# Patient Record
Sex: Male | Born: 1980 | Race: White | Hispanic: No | Marital: Married | Smoking: Never smoker
Health system: Southern US, Community
[De-identification: ages and names within clinical notes are randomized; demographics above are authoritative.]

## PROBLEM LIST (undated history)

## (undated) DIAGNOSIS — G500 Trigeminal neuralgia: Secondary | ICD-10-CM

## (undated) DIAGNOSIS — G4700 Insomnia, unspecified: Secondary | ICD-10-CM

## (undated) DIAGNOSIS — I10 Essential (primary) hypertension: Secondary | ICD-10-CM

## (undated) DIAGNOSIS — E663 Overweight: Secondary | ICD-10-CM

## (undated) DIAGNOSIS — F411 Generalized anxiety disorder: Secondary | ICD-10-CM

## (undated) DIAGNOSIS — G43009 Migraine without aura, not intractable, without status migrainosus: Secondary | ICD-10-CM

## (undated) HISTORY — DX: Migraine without aura, not intractable, without status migrainosus: G43.009

## (undated) HISTORY — DX: Insomnia, unspecified: G47.00

## (undated) HISTORY — PX: TONSILLECTOMY: SUR1361

## (undated) HISTORY — DX: Generalized anxiety disorder: F41.1

## (undated) HISTORY — DX: Overweight: E66.3

## (undated) HISTORY — DX: Essential (primary) hypertension: I10

---
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
## 2007-07-04 ENCOUNTER — Emergency Department (HOSPITAL_COMMUNITY): Admission: EM | Admit: 2007-07-04 | Discharge: 2007-07-04 | Payer: Self-pay | Admitting: Emergency Medicine

## 2008-02-20 ENCOUNTER — Encounter: Admission: RE | Admit: 2008-02-20 | Discharge: 2008-02-20 | Payer: Self-pay | Admitting: Family Medicine

## 2014-07-10 ENCOUNTER — Encounter: Payer: Self-pay | Admitting: Neurology

## 2014-07-10 ENCOUNTER — Ambulatory Visit (INDEPENDENT_AMBULATORY_CARE_PROVIDER_SITE_OTHER): Payer: BC Managed Care – PPO | Admitting: Neurology

## 2014-07-10 VITALS — BP 138/80 | HR 82 | Ht 70.5 in | Wt 191.0 lb

## 2014-07-10 DIAGNOSIS — G35 Multiple sclerosis: Secondary | ICD-10-CM

## 2014-07-10 DIAGNOSIS — G509 Disorder of trigeminal nerve, unspecified: Secondary | ICD-10-CM

## 2014-07-10 DIAGNOSIS — G500 Trigeminal neuralgia: Secondary | ICD-10-CM

## 2014-07-10 DIAGNOSIS — M5481 Occipital neuralgia: Secondary | ICD-10-CM

## 2014-07-10 DIAGNOSIS — R202 Paresthesia of skin: Secondary | ICD-10-CM

## 2014-07-10 DIAGNOSIS — F411 Generalized anxiety disorder: Secondary | ICD-10-CM

## 2014-07-10 MED ORDER — CARBAMAZEPINE ER 200 MG PO TB12: 200.0000 mg | ORAL_TABLET | Freq: Two times a day (BID) | ORAL | Status: DC

## 2014-07-10 NOTE — Patient Instructions (Signed)
Overall you are doing fairly well but I do want to suggest a few things today:   Remember to drink plenty of fluid, eat healthy meals and do not skip any meals. Try to eat protein with a every meal and eat a healthy snack such as fruit or nuts in between meals. Try to keep a regular sleep-wake schedule and try to exercise daily, particularly in the form of walking, 20-30 minutes a day, if you can.   As far as your medications are concerned, I would like to suggest: Tegretol XR 200mg  twice daily  As far as diagnostic testing: MRI of the brain, MRI of the cervical spine, lab  I would like to see you back in 3 months, sooner if we need to. Please call us with any interim questions, concerns, problems, updates or refill requests.   Please also call us for any test results so we can go over those with you on the phone.  My clinical assistant and will answer any of your questions and relay your messages to me and also relay most of my messages to you.   Our phone number is [phone]. We also have an after hours call service for urgent matters and there is a physician on-call for urgent questions. For any emergencies you know to call 911 or go to the nearest emergency room

## 2014-07-10 NOTE — Progress Notes (Signed)
[facility]    Provider:  Dr Lucia Gaskins Referring Provider: Sigmund Hazel, MD Primary Care Physician:  Neldon Labella, MD  CC:  Severe headache  HPI:  Juan Clarke is a 33 y.o. male here as a referral from Dr. Hyacinth Meeker for headache. He has random shooting pains in the crown of head. They are disorienting and they happen 5 times in an hour at least. Sometimes if he bends his neck forward there is an electrical shooting pain down his spine. Sometimes shooting pain in the bilateral temples. Sometimes eyes hurt. Nose is constantly running and gets congested when the symtpoms start. Symptoms are every day. They are brief, intense, lightning bolts, severe 10/10. Drinks 2 drinks a night. Symptoms started years ago. He started having a burning sensation in his head. Burning in the back of the head. 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 Up to 30 times a day now or more. Brief. Random. No tenderness. No neck pain. Happens on both sides but not usually together. He has electric pain down the neck. Unclear what the triggers are andnothing makes it better. No weakness. No other focal neurologic symptoms. No FHx of neuromuscular, neurodegenerative, rheumatologic disorders.   Reviewed notes, labs and imaging from outside physicians, which showed:   DG lumbar spine 2009 Clinical Data: Recurrent midline low back pain. No trauma.  LUMBAR SPINE - COMPLETE 4+ VIEW  Comparison: None  Findings: There is no evidence of lumbar spine fracture. Alignment is normal. Intervertebral disc spaces are maintained.Five non-rib bearing lumbar vertebrae noted.  Review of Systems: Patient complains of symptoms per HPI as well as the following symptoms: Anxiety. No CP, SOB. Pertinent negatives per HPI. All others negative.   History   Social History  . Marital Status: Married    Spouse Name: Juan Clarke    Number of Children: 0  . Years of Education: 12+     Occupational History  . Teacher     Social History Main Topics  . Smoking status: Never Smoker   . Smokeless tobacco: Never Used  . Alcohol Use: 0.0 oz/week    0 Not specified per week     Comment: Occasionally  . Drug Use: Not on file  . Sexual Activity: Not on file   Other Topics Concern  . Not on file   Social History Narrative   Patient is married to [name]   Patient has no children.    Patient is a Runner, broadcasting/film/video    Patient has a college education    Family History  Problem Relation Age of Onset  . Hypertension Father   . Migraines Mother   . Mental illness Sister   . Drug abuse Sister   . Brain cancer Maternal Grandmother   . Heart disease Paternal Grandfather     Past Medical History  Diagnosis Date  . Insomnia   . Migraine without aura   . HTN (hypertension)   . Overweight   . GAD (generalized anxiety disorder)     Past Surgical History  Procedure Laterality Date  . None      Current Outpatient Prescriptions  Medication Sig Dispense Refill  . carbamazepine (TEGRETOL-XR) 200 MG 12 hr tablet Take 2 tablets (400 mg total) by mouth 2 (two) times daily. 120 tablet 6  . clonazePAM (KLONOPIN) 0.5 MG tablet 0.5 mg. 1/2 TAB BID  0  . lisinopril-hydrochlorothiazide (PRINZIDE,ZESTORETIC) 10-12.5 MG per tablet daily.  0   No  current facility-administered medications for this visit.    Allergies as of 07/10/2014 - Review Complete 07/10/2014  Allergen Reaction Noted  . Lexapro [escitalopram oxalate]  07/10/2014  . Zoloft [sertraline hcl]  07/10/2014    Vitals: BP 138/80 mmHg  Pulse 82  Ht 5' 10.5" (1.791 m)  Wt 191 lb (86.637 kg)  BMI 27.01 kg/m2 Last Weight:  Wt Readings from Last 1 Encounters:  07/10/14 191 lb (86.637 kg)   Last Height:   Ht Readings from Last 1 Encounters:  07/10/14 5' 10.5" (1.791 m)     Physical exam: Exam: Gen: NAD, conversant, well nourised, well groomed                     CV: RRR, no MRG. No Carotid Bruits. No peripheral  edema, warm, nontender Eyes: Conjunctivae clear without exudates or hemorrhage  Neuro: Detailed Neurologic Exam  Speech:    Speech is normal; fluent and spontaneous with normal comprehension.  Cognition:    The patient is oriented to person, place, and time;     recent and remote memory intact;     language fluent;     normal attention, concentration,     fund of knowledge Cranial Nerves:    The pupils are equal, round, and reactive to light. The fundi are normal and spontaneous venous pulsations are present. Visual fields are full to finger confrontation. Extraocular movements are intact. Trigeminal sensation is intact and the muscles of mastication are normal. The face is symmetric. The palate elevates in the midline. Voice is normal. Shoulder shrug is normal. The tongue has normal motion without fasciculations.   Coordination:    Normal finger to nose and heel to shin. Normal rapid alternating movements.   Gait:    Heel-toe and tandem gait are normal.   Motor Observation:    No asymmetry, no atrophy, and no involuntary movements noted. Tone:    Normal muscle tone.    Posture:    Posture is normal. normal erect    Strength:    Strength is V/V in the upper and lower limbs.      Sensation: intact     Reflex Exam:  DTR's:    Deep tendon reflexes in the upper and lower extremities are normal bilaterally.   Toes:    The toes are downgoing bilaterally.   Clonus:    Clonus is absent.     Assessment/Plan:  33 year old male with atypical trigeminal and occipital neuralgia. Also complains of Lheurmhitte's sign. Concerning for compressive, infiltrating or demyelinating disorder causing etiology of atypical pains that are bilateral and occurring all day long with 10/10 severity. Will order an MRI of the brain and cervical spine w/wo contrast. Will start Tegretol. Follow up after testing complete. Neuro exam non focal. Will order BMP lab for contrast. Continue Klonopin for  anxiety.  Naomie DeanAntonia Marshell Dilauro, MD  [facility] [address] [place], [place] [id number]  Phone [phone] Fax [phone]

## 2014-07-11 ENCOUNTER — Telehealth: Payer: Self-pay

## 2014-07-11 LAB — BASIC METABOLIC PANEL
BUN/Creatinine Ratio: 17 (ref 8–19)
BUN: 18 mg/dL (ref 6–20)
CALCIUM: 9.9 mg/dL (ref 8.7–10.2)
CO2: 26 mmol/L (ref 18–29)
CREATININE: 1.06 mg/dL (ref 0.76–1.27)
Chloride: 99 mmol/L (ref 97–108)
GFR calc Af Amer: 108 mL/min/{1.73_m2} (ref >59)
GFR, EST NON AFRICAN AMERICAN: 93 mL/min/{1.73_m2} (ref >59)
GLUCOSE: 87 mg/dL (ref 65–99)
Potassium: 4.4 mmol/L (ref 3.5–5.2)
SODIUM: 141 mmol/L (ref 134–144)

## 2014-07-11 NOTE — Telephone Encounter (Signed)
Called patient. Left vmail.

## 2014-07-11 NOTE — Telephone Encounter (Signed)
-----   Message from Anson Fret, MD sent at 07/11/2014  1:42 PM EST ----- Please let patient know his lab was normal. Thank you.

## 2014-07-12 ENCOUNTER — Telehealth: Payer: Self-pay | Admitting: Neurology

## 2014-07-12 ENCOUNTER — Other Ambulatory Visit: Payer: Self-pay | Admitting: Neurology

## 2014-07-12 DIAGNOSIS — G500 Trigeminal neuralgia: Secondary | ICD-10-CM

## 2014-07-12 DIAGNOSIS — M5481 Occipital neuralgia: Secondary | ICD-10-CM

## 2014-07-12 MED ORDER — CARBAMAZEPINE ER 200 MG PO TB12: 400.0000 mg | ORAL_TABLET | Freq: Two times a day (BID) | ORAL | Status: DC

## 2014-07-12 NOTE — Telephone Encounter (Signed)
Called and left him message, increasing his medication.

## 2014-07-12 NOTE — Telephone Encounter (Signed)
Patient stated Rx carbamazepine (TEGRETOL-XR) 200 MG 12 hr tablet is relieving some of the sharp pains in head.  Working to certain extent and questioning if dosage needs to be increased.  Please call work # [phone] x [phone] and if not available try cell # listed.Marland Kitchen

## 2014-07-12 NOTE — Telephone Encounter (Signed)
Please see phone note  

## 2014-07-13 ENCOUNTER — Encounter: Payer: Self-pay | Admitting: Neurology

## 2014-07-13 ENCOUNTER — Encounter (HOSPITAL_COMMUNITY): Payer: Self-pay

## 2014-07-13 ENCOUNTER — Emergency Department (HOSPITAL_COMMUNITY)
Admission: EM | Admit: 2014-07-13 | Discharge: 2014-07-13 | Disposition: A | Discharge status: Home / Self Care | Payer: BC Managed Care – PPO | Attending: Emergency Medicine | Admitting: Emergency Medicine

## 2014-07-13 ENCOUNTER — Telehealth: Payer: Self-pay | Admitting: Neurology

## 2014-07-13 DIAGNOSIS — I10 Essential (primary) hypertension: Secondary | ICD-10-CM | POA: Diagnosis not present

## 2014-07-13 DIAGNOSIS — H02846 Edema of left eye, unspecified eyelid: Secondary | ICD-10-CM | POA: Diagnosis not present

## 2014-07-13 DIAGNOSIS — Z79899 Other long term (current) drug therapy: Secondary | ICD-10-CM | POA: Insufficient documentation

## 2014-07-13 DIAGNOSIS — G43909 Migraine, unspecified, not intractable, without status migrainosus: Secondary | ICD-10-CM | POA: Diagnosis not present

## 2014-07-13 DIAGNOSIS — F411 Generalized anxiety disorder: Secondary | ICD-10-CM | POA: Insufficient documentation

## 2014-07-13 DIAGNOSIS — G500 Trigeminal neuralgia: Secondary | ICD-10-CM | POA: Insufficient documentation

## 2014-07-13 DIAGNOSIS — M5481 Occipital neuralgia: Secondary | ICD-10-CM | POA: Insufficient documentation

## 2014-07-13 DIAGNOSIS — F419 Anxiety disorder, unspecified: Secondary | ICD-10-CM | POA: Insufficient documentation

## 2014-07-13 DIAGNOSIS — G4700 Insomnia, unspecified: Secondary | ICD-10-CM | POA: Diagnosis not present

## 2014-07-13 DIAGNOSIS — R220 Localized swelling, mass and lump, head: Secondary | ICD-10-CM | POA: Diagnosis present

## 2014-07-13 DIAGNOSIS — R200 Anesthesia of skin: Secondary | ICD-10-CM

## 2014-07-13 DIAGNOSIS — G509 Disorder of trigeminal nerve, unspecified: Secondary | ICD-10-CM | POA: Insufficient documentation

## 2014-07-13 NOTE — Telephone Encounter (Signed)
The patient called. He has developed left facial numbness and left eye swelling within the last 1 hour. Not clear what the cause of this is. The patient was placed on tegretol recently, but this does not appear to be a medication reaction. The patient will go to the ER for evaluation.

## 2014-07-13 NOTE — ED Notes (Signed)
Pt ambulatory upon dc. He denies pain. He is alert and oriented.

## 2014-07-13 NOTE — ED Provider Notes (Signed)
CSN: [id number]     Arrival date & time 07/13/14  1611 History   First MD Initiated Contact with Patient 07/13/14 1731     Chief Complaint  Patient presents with  . Trigeminal Neuralgia  . Facial Swelling      HPI Patient presents with intermittent sharp pain around his occiput and down his neck for the past year and a half.  He's been seen by his neurology team and they suspect he has trigeminal neuralgia.  This afternoon his wife reported a mild amount of swelling around his medial aspect of his left periorbital region.  Patient also reports some numbness and abnormal sensation coming through his left cheek.  They called his neurologist and his neurologist recommended he come to the ER for evaluation given the swelling around the eye.  Patient denies change in his vision.  No pain with extraocular movement.  No headaches at this time.  No recent injury or trauma.  He states initially he was having some itching around his eye prior to the swelling and was rubbing the medial aspect of his eye.  No facial or scalp lesions of been noted by the patient.  No history of shingles.patient reports she has had no imaging to this point and he is scheduled to have an MRI as an outpatient.  He wonders if this can be completed today.  He recently had his Tegretol increased and started his new higher dose of 400 mg twice a day yesterday.patient denies weakness or numbness of his arms or legs.  Patient does not have a history of asthma or eczema but his mother does.  No allergic reactions in the past the patient.   Past Medical History  Diagnosis Date  . Insomnia   . Migraine without aura   . HTN (hypertension)   . Overweight   . GAD (generalized anxiety disorder)    Past Surgical History  Procedure Laterality Date  . None     Family History  Problem Relation Age of Onset  . Hypertension Father   . Migraines Mother   . Mental illness Sister   . Drug abuse Sister   . Brain cancer Maternal  Grandmother   . Heart disease Paternal Grandfather    History  Substance Use Topics  . Smoking status: Never Smoker   . Smokeless tobacco: Never Used  . Alcohol Use: 0.0 oz/week    0 Not specified per week     Comment: Occasionally    Review of Systems  All other systems reviewed and are negative.     Allergies  Lexapro and Zoloft  Home Medications   Prior to Admission medications   Medication Sig Start Date End Date Taking? Authorizing Provider  carbamazepine (TEGRETOL-XR) 200 MG 12 hr tablet Take 2 tablets (400 mg total) by mouth 2 (two) times daily. 07/12/14   Anson FretAntonia B Ahern, MD  clonazePAM (KLONOPIN) 0.5 MG tablet 0.5 mg. 1/2 TAB BID 06/07/14   Historical Provider, MD  lisinopril-hydrochlorothiazide (PRINZIDE,ZESTORETIC) 10-12.5 MG per tablet daily. 06/07/14   Historical Provider, MD   BP 154/91 mmHg  Pulse 92  Temp(Src) 98.7 F (37.1 C) (Oral)  Resp 16  SpO2 100% Physical Exam  Constitutional: He is oriented to person, place, and time. He appears well-developed and well-nourished.  HENT:  Head: Normocephalic and atraumatic.  Very small amount of residual edema to the medial canthal region of his left eye.  Ex her commitments intact.  Eyes: EOM are normal. Pupils are equal, round, and  reactive to light.  Neck: Normal range of motion.  Cardiovascular: Normal rate, regular rhythm, normal heart sounds and intact distal pulses.   Pulmonary/Chest: Effort normal and breath sounds normal. No respiratory distress.  Abdominal: Soft. He exhibits no distension. There is no tenderness.  Musculoskeletal: Normal range of motion.  Neurological: He is alert and oriented to person, place, and time.  5/5 strength in major muscle groups of  bilateral upper and lower extremities. Speech normal. No facial asymetry. Normal finger to nose bilaterally.  Normal motor function of his face  Skin: Skin is warm and dry.  Psychiatric: He has a normal mood and affect. Judgment normal.  Nursing  note and vitals reviewed.   ED Course  Procedures (including critical care time) Labs Review Labs Reviewed - No data to display  Imaging Review No results found.   EKG Interpretation None      MDM   Final diagnoses:  Swelling of eyelid, left  Facial numbness    Overall the patient is well-appearing.  He'll need to continue following up with his neurologist.  I suspect the swelling around his left eye was more from rubbing his eye than anything.  No local irritation noted.  No signs of periorbital cellulitis.  Regarding his ongoing shooting sharp neurologic-like pain coming across his head, neck, face this will need to be worked up as an outpatient.  No indication for laboratory studies today.  No indication for acute imaging.    Lyanne Co, MD 07/13/14 [phone]

## 2014-07-13 NOTE — ED Notes (Addendum)
Per pt, was seen by neurology recently for facial numbness with dx of neuralgia.  Per pt,  needs scheduled for MRI.  Pt having facial swelling around eye and numbness to left side. Notified MD and told to come here.  Pt also had tegretol increased recently.

## 2014-07-13 NOTE — ED Notes (Signed)
MD Campos at bedside.  

## 2014-07-18 ENCOUNTER — Ambulatory Visit (INDEPENDENT_AMBULATORY_CARE_PROVIDER_SITE_OTHER): Payer: BC Managed Care – PPO

## 2014-07-18 DIAGNOSIS — G35 Multiple sclerosis: Secondary | ICD-10-CM

## 2014-07-18 DIAGNOSIS — M5481 Occipital neuralgia: Secondary | ICD-10-CM

## 2014-07-18 DIAGNOSIS — G500 Trigeminal neuralgia: Secondary | ICD-10-CM

## 2014-07-19 MED ORDER — GADOPENTETATE DIMEGLUMINE 469.01 MG/ML IV SOLN: 19.0000 mL | Freq: Once | INTRAVENOUS | Status: AC | PRN

## 2014-07-24 ENCOUNTER — Telehealth: Payer: Self-pay | Admitting: Neurology

## 2014-07-24 NOTE — Telephone Encounter (Signed)
Left message. Unremarkable MRI of the brain and MRI of the cervical spine. Some minimal degenerative changes. Incidental chronic sinusitis. Nothing acute.

## 2014-08-01 ENCOUNTER — Encounter: Payer: Self-pay | Admitting: Neurology

## 2014-08-01 ENCOUNTER — Ambulatory Visit (INDEPENDENT_AMBULATORY_CARE_PROVIDER_SITE_OTHER): Payer: BC Managed Care – PPO | Admitting: Neurology

## 2014-08-01 VITALS — BP 118/75 | HR 82 | Ht 70.5 in | Wt 194.4 lb

## 2014-08-01 DIAGNOSIS — G500 Trigeminal neuralgia: Secondary | ICD-10-CM

## 2014-08-01 DIAGNOSIS — M5481 Occipital neuralgia: Secondary | ICD-10-CM

## 2014-08-01 MED ORDER — GABAPENTIN 300 MG PO CAPS: 600.0000 mg | ORAL_CAPSULE | Freq: Three times a day (TID) | ORAL | Status: DC

## 2014-08-01 NOTE — Progress Notes (Signed)
[facility]    Provider:  Dr Lucia Gaskins Referring Provider: Sigmund Hazel, MD Primary Care Physician:  Neldon Labella, MD   CC: Severe headache  Interval History: The Carbemazepine helped with the symptoms. He had an ED visit for eye swelling and eye numbness which resolved. He is still having lightning bolt/shooting pain in the back of the head which radiate to the crown of the head. Sometimes occurs in the bilateral temples. +Rhinorrhea with the symptoms. Severe 10/10 when the pain happens. The Tegretol helps but doesn't last all day, around 4pm the symptoms come back and they are just as bad. He is getting tremors from the medication. He feels tired all the time. Has anxiety that is chronic and takes Klonopin daily.    Appointment November 11th:  Malike Foglio [place]dington is a 33 y.o. male here as a referral from Dr. Hyacinth Meeker for headache. He has random shooting pains in the crown of head. They are disorienting and they happen 5 times in an hour at least. Sometimes if he bends his neck forward there is an electrical shooting pain down his spine. Sometimes shooting pain in the bilateral temples. Sometimes eyes hurt. Nose is constantly running and gets congested when the symtpoms start. Symptoms are every day. They are brief, intense, lightning bolts, severe 10/10. Drinks 2 drinks a night. Symptoms started years ago. He started having a burning sensation in his head. Burning in the back of the head. 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 Up to 30 times a day now or more. Brief. Random. No tenderness. No neck pain. Happens on both sides but not usually together. He has electric pain down the neck. Unclear what the triggers are andnothing makes it better. No weakness. No other focal neurologic symptoms. No FHx of neuromuscular, neurodegenerative, rheumatologic disorders.   Reviewed notes, labs and imaging from outside physicians, which showed:    Personally reviewed images: MRI of the brain and cervical spine: Normal brain, mild degenerative change in the cervical spine.    Review of Systems: Patient complains of symptoms per HPI as well as the following symptoms: joint pain, excessive headache. Pertinent negatives per HPI. All others negative.   History   Social History  . Marital Status: Married    Spouse Name: Brayton Caves    Number of Children: 0  . Years of Education: 12+   Occupational History  . Teacher     Social History Main Topics  . Smoking status: Never Smoker   . Smokeless tobacco: Never Used  . Alcohol Use: 0.0 oz/week    0 Not specified per week     Comment: Occasionally  . Drug Use: Not on file  . Sexual Activity: Not on file   Other Topics Concern  . Not on file   Social History Narrative   Patient is married to [place]   Patient has no children.    Patient is a Runner, broadcasting/film/video    Patient has a college education    Family History  Problem Relation Age of Onset  . Hypertension Father   . Migraines Mother   . Mental illness Sister   . Drug abuse Sister   . Brain cancer Maternal Grandmother   . Heart disease Paternal Grandfather     Past Medical History  Diagnosis Date  . Insomnia   . Migraine without aura   . HTN (hypertension)   . Overweight   . GAD (generalized  anxiety disorder)     Past Surgical History  Procedure Laterality Date  . None      Current Outpatient Prescriptions  Medication Sig Dispense Refill  . carbamazepine (TEGRETOL-XR) 200 MG 12 hr tablet Take 2 tablets (400 mg total) by mouth 2 (two) times daily. 120 tablet 6  . clonazePAM (KLONOPIN) 0.5 MG tablet 0.5 mg. 1/2 TAB BID  0  . lisinopril-hydrochlorothiazide (PRINZIDE,ZESTORETIC) 10-12.5 MG per tablet daily.  0  . gabapentin (NEURONTIN) 300 MG capsule Take 2 capsules (600 mg total) by mouth 3 (three) times daily. 180 capsule 6   No current facility-administered medications for this visit.    Allergies as of 08/01/2014 -  Review Complete 08/01/2014  Allergen Reaction Noted  . Lexapro [escitalopram oxalate]  07/10/2014  . Zoloft [sertraline hcl]  07/10/2014    Vitals: BP 118/75 mmHg  Pulse 82  Ht 5' 10.5" (1.791 m)  Wt 194 lb 6.4 oz (88.179 kg)  BMI 27.49 kg/m2 Last Weight:  Wt Readings from Last 1 Encounters:  08/01/14 194 lb 6.4 oz (88.179 kg)   Last Height:   Ht Readings from Last 1 Encounters:  08/01/14 5' 10.5" (1.791 m)      Physical exam: Exam: Gen: NAD, conversant, well nourised, well groomed  CV: RRR, no MRG. No Carotid Bruits. No peripheral edema, warm, nontender Eyes: Conjunctivae clear without exudates or hemorrhage  Neuro: Detailed Neurologic Exam  Speech:  Speech is normal; fluent and spontaneous with normal comprehension.  Cognition:  The patient is oriented to person, place, and time;   recent and remote memory intact;   language fluent;   normal attention, concentration,   fund of knowledge Cranial Nerves:  The pupils are equal, round, and reactive to light. The fundi are normal and spontaneous venous pulsations are present. Visual fields are full to finger confrontation. Extraocular movements are intact. Trigeminal sensation is intact and the muscles of mastication are normal. The face is symmetric. The palate elevates in the midline. Voice is normal. Shoulder shrug is normal. The tongue has normal motion without fasciculations.   Coordination:  Normal finger to nose and heel to shin. Normal rapid alternating movements.   Gait:  Heel-toe and tandem gait are normal.   Motor Observation:  No asymmetry, no atrophy, and no involuntary movements noted. Tone:  Normal muscle tone.   Posture:  Posture is normal. normal erect   Strength:  Strength is V/V in the upper and lower limbs.    Sensation: intact   Reflex Exam:  DTR's:  Deep tendon reflexes in the upper and lower extremities are normal  bilaterally.  Toes:  The toes are downgoing bilaterally.  Clonus:  Clonus is absent.    Assessment/Plan: 33 year old male with atypical trigeminal and occipital neuralgia. MRi of the brain and c-spine unremarkable. Tegretol is helping but he is fatigued and tired. Can try Neurontin prn when needed after Tegretol wears off.  Will do occipital and trigeminal nerve blocks. Continue Klonopin for anxiety.    Naomie DeanAntonia Shivonne Schwartzman, MD  [facility] [address] [place], [place] [id number]  Phone [phone] Fax [phone]

## 2014-08-01 NOTE — Patient Instructions (Addendum)
Overall you are doing fairly well but I do want to suggest a few things today:   Remember to drink plenty of fluid, eat healthy meals and do not skip any meals. Try to eat protein with a every meal and eat a healthy snack such as fruit or nuts in between meals. Try to keep a regular sleep-wake schedule and try to exercise daily, particularly in the form of walking, 20-30 minutes a day, if you can.   As far as your medications are concerned, I would like to suggest: Neurontin 300mg , take 1-2 three times a day as needed.   As far as diagnostic testing: Occipital and Trigeminal nerve blocks  I would like to see you back within a month for nerve blocks, sooner if we need to. Please call us with any interim questions, concerns, problems, updates or refill requests.   Please also call us for any test results so we can go over those with you on the phone.  My clinical assistant and will answer any of your questions and relay your messages to me and also relay most of my messages to you.   Our phone number is [phone]. We also have an after hours call service for urgent matters and there is a physician on-call for urgent questions. For any emergencies you know to call 911 or go to the nearest emergency room

## 2014-08-05 ENCOUNTER — Ambulatory Visit (INDEPENDENT_AMBULATORY_CARE_PROVIDER_SITE_OTHER): Payer: BC Managed Care – PPO | Admitting: Neurology

## 2014-08-05 ENCOUNTER — Encounter: Payer: Self-pay | Admitting: Neurology

## 2014-08-05 VITALS — BP 123/77 | HR 77 | Ht 70.5 in | Wt 193.8 lb

## 2014-08-05 DIAGNOSIS — G500 Trigeminal neuralgia: Secondary | ICD-10-CM

## 2014-08-05 MED ORDER — METHYLPREDNISOLONE 4 MG PO KIT: PACK | ORAL | Status: DC

## 2014-08-08 NOTE — Progress Notes (Signed)
HPI: Juan Clarke is a 33 y.o. male here as a referral from Dr. Hyacinth Meeker for headache. He has random shooting pains in the crown of head. They are disorienting and they happen 5 times in an hour at least. Sometimes if he bends his neck forward there is an electrical shooting pain down his spine. Sometimes shooting pain in the bilateral temples. Sometimes eyes hurt. Nose is constantly running and gets congested when the symtpoms start. Symptoms are every day. They are brief, intense, lightning bolts, severe 10/10. Drinks 2 drinks a night. Symptoms started years ago. He started having a burning sensation in his head. Burning in the back of the head. 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 Up to 30 times a day now or more. Brief. Random. No tenderness. No neck pain. Happens on both sides but not usually together. He has electric pain down the neck. Unclear what the triggers are andnothing makes it better. No weakness. No other focal neurologic symptoms. No FHx of neuromuscular, neurodegenerative, rheumatologic disorders.   Patient was consented for bilateral occipital and trigeminal nerve blocks. A solution containing containing 0.5% 5mg /ml Bupivacaine 8-cc and 80mg  Depo Medrol 2cc was prepared in 2 5-CC syringes with 27 gauge 1/2 inch needles  10 Target areas in the occipital, suboccipital and temporal regions were identified via palpation and pain response.The sites junctions were sterilized with alcohol wipes. 68ml was injected at each site. The contents of each syringe was injected in a fanlike fashion on each side.  Patient tolerated the procedure well and no complications were noted.    Patient instructed on the following: Tomorrow take 3 tabs of 4mg  oral decadron Day 2 take 2 tabs 4mg  oral decadron Day 3 take 1 tab 4mg  decadron   What to expect afterwards?  Immediately after the injection, the back of your head may feel warm and numb. You may also experience  reduction in the pain. The local anaesthetic wears off in a few hours and the steroid usually takes  3-7 days to take effect.   The pain relief is vary variable and can last from a few days to several months. Some patients do not experience any pain relief. Hence it is difficult to predict the outcome of the injection treatment in a particular patient.   There may be some discomfort at the injection site for a couple of days after treatment, however, this should settle quite quickly. We advise you to take things easy for the rest of the day. Continue taking your pain medication as advised by your consultant or until you feel benefit from the treatment.   What are the side effects / complications?  Common   Soreness / bruising at the injection site.   Temporary increase (up to 7 days) in pain following procedure.   Rare   Bleeding   Infection at the injection site   Allergic reaction   New pain   Worsening pain  .

## 2014-08-14 ENCOUNTER — Telehealth: Payer: Self-pay | Admitting: Neurology

## 2014-08-14 ENCOUNTER — Other Ambulatory Visit: Payer: Self-pay | Admitting: Neurology

## 2014-08-14 DIAGNOSIS — G44099 Other trigeminal autonomic cephalgias (TAC), not intractable: Secondary | ICD-10-CM

## 2014-08-14 DIAGNOSIS — Z79899 Other long term (current) drug therapy: Secondary | ICD-10-CM

## 2014-08-14 MED ORDER — CARBAMAZEPINE ER 200 MG PO TB12: 600.0000 mg | ORAL_TABLET | Freq: Two times a day (BID) | ORAL | Status: DC

## 2014-08-14 MED ORDER — INDOMETHACIN 50 MG PO CAPS: 50.0000 mg | ORAL_CAPSULE | Freq: Two times a day (BID) | ORAL | Status: DC

## 2014-08-14 NOTE — Telephone Encounter (Signed)
Spoke with patient. Will increase tegretol and add indomethacin. He will come in for blood levels next week.

## 2014-08-14 NOTE — Telephone Encounter (Signed)
Patient has questions regarding Nerve block and medication.  Please call after 4:00 pm

## 2014-08-17 ENCOUNTER — Other Ambulatory Visit: Payer: Self-pay | Admitting: Neurology

## 2014-08-20 ENCOUNTER — Other Ambulatory Visit: Payer: Self-pay | Admitting: Neurology

## 2014-08-20 ENCOUNTER — Other Ambulatory Visit (INDEPENDENT_AMBULATORY_CARE_PROVIDER_SITE_OTHER): Payer: BC Managed Care – PPO

## 2014-08-20 DIAGNOSIS — Z79899 Other long term (current) drug therapy: Secondary | ICD-10-CM

## 2014-08-20 DIAGNOSIS — Z0289 Encounter for other administrative examinations: Secondary | ICD-10-CM

## 2014-08-20 DIAGNOSIS — G44099 Other trigeminal autonomic cephalgias (TAC), not intractable: Secondary | ICD-10-CM

## 2014-08-20 DIAGNOSIS — M5481 Occipital neuralgia: Secondary | ICD-10-CM

## 2014-08-22 LAB — CBC
HEMATOCRIT: 45.4 % (ref 37.5–51.0)
HEMOGLOBIN: 15.5 g/dL (ref 12.6–17.7)
MCH: 30.5 pg (ref 26.6–33.0)
MCHC: 34.1 g/dL (ref 31.5–35.7)
MCV: 89 fL (ref 79–97)
Platelets: 221 10*3/uL (ref 150–379)
RBC: 5.09 x10E6/uL (ref 4.14–5.80)
RDW: 13.1 % (ref 12.3–15.4)
WBC: 5.9 10*3/uL (ref 3.4–10.8)

## 2014-08-22 LAB — COMPREHENSIVE METABOLIC PANEL
ALK PHOS: 70 IU/L (ref 39–117)
ALT: 35 IU/L (ref 0–44)
AST: 35 IU/L (ref 0–40)
Albumin/Globulin Ratio: 2.5 (ref 1.1–2.5)
Albumin: 4.9 g/dL (ref 3.5–5.5)
BILIRUBIN TOTAL: 0.2 mg/dL (ref 0.0–1.2)
BUN/Creatinine Ratio: 20 — ABNORMAL HIGH (ref 8–19)
BUN: 25 mg/dL — ABNORMAL HIGH (ref 6–20)
CHLORIDE: 97 mmol/L (ref 97–108)
CO2: 25 mmol/L (ref 18–29)
Calcium: 9.1 mg/dL (ref 8.7–10.2)
Creatinine, Ser: 1.28 mg/dL — ABNORMAL HIGH (ref 0.76–1.27)
GFR calc non Af Amer: 74 mL/min/{1.73_m2} (ref >59)
GFR, EST AFRICAN AMERICAN: 86 mL/min/{1.73_m2} (ref >59)
GLUCOSE: 97 mg/dL (ref 65–99)
Globulin, Total: 2 g/dL (ref 1.5–4.5)
POTASSIUM: 4.6 mmol/L (ref 3.5–5.2)
Sodium: 137 mmol/L (ref 134–144)
TOTAL PROTEIN: 6.9 g/dL (ref 6.0–8.5)

## 2014-08-22 LAB — ANA W/REFLEX: ANA: NEGATIVE

## 2014-08-22 LAB — RPR: SYPHILIS RPR SCR: NONREACTIVE

## 2014-08-22 LAB — HIV ANTIBODY (ROUTINE TESTING W REFLEX)
HIV 1/O/2 Abs-Index Value: <1 (ref <1.00)
HIV-1/HIV-2 Ab: NONREACTIVE

## 2014-08-22 LAB — LYME, TOTAL AB TEST/REFLEX: Lyme IgG/IgM Ab: <0.91 {ISR} (ref 0.00–0.90)

## 2014-08-22 LAB — SEDIMENTATION RATE: Sed Rate: 2 mm/hr (ref 0–15)

## 2014-08-22 LAB — CARBAMAZEPINE, FREE AND TOTAL: CARBAMAZEPINE FREE: 2.8 ug/mL (ref 0.6–4.2)

## 2014-08-22 LAB — CARBAMAZEPINE LEVEL, TOTAL: Carbamazepine Lvl: 11.9 ug/mL (ref 4.0–12.0)

## 2014-08-22 LAB — C-REACTIVE PROTEIN: CRP: 3.6 mg/L (ref 0.0–4.9)

## 2014-08-26 NOTE — Progress Notes (Signed)
Called patient, LM that his labs were normal.

## 2014-11-14 ENCOUNTER — Emergency Department (HOSPITAL_COMMUNITY): Payer: BC Managed Care – PPO

## 2014-11-14 ENCOUNTER — Emergency Department (HOSPITAL_COMMUNITY)
Admission: EM | Admit: 2014-11-14 | Discharge: 2014-11-15 | Disposition: A | Discharge status: Home / Self Care | Payer: BC Managed Care – PPO | Attending: Emergency Medicine | Admitting: Emergency Medicine

## 2014-11-14 ENCOUNTER — Encounter (HOSPITAL_COMMUNITY): Payer: Self-pay | Admitting: Emergency Medicine

## 2014-11-14 DIAGNOSIS — Y9355 Activity, bike riding: Secondary | ICD-10-CM | POA: Diagnosis not present

## 2014-11-14 DIAGNOSIS — F419 Anxiety disorder, unspecified: Secondary | ICD-10-CM | POA: Insufficient documentation

## 2014-11-14 DIAGNOSIS — I10 Essential (primary) hypertension: Secondary | ICD-10-CM | POA: Insufficient documentation

## 2014-11-14 DIAGNOSIS — Z23 Encounter for immunization: Secondary | ICD-10-CM | POA: Insufficient documentation

## 2014-11-14 DIAGNOSIS — S01511A Laceration without foreign body of lip, initial encounter: Secondary | ICD-10-CM | POA: Diagnosis not present

## 2014-11-14 DIAGNOSIS — Y998 Other external cause status: Secondary | ICD-10-CM | POA: Insufficient documentation

## 2014-11-14 DIAGNOSIS — Y9289 Other specified places as the place of occurrence of the external cause: Secondary | ICD-10-CM | POA: Diagnosis not present

## 2014-11-14 DIAGNOSIS — S01522A Laceration with foreign body of oral cavity, initial encounter: Secondary | ICD-10-CM | POA: Diagnosis not present

## 2014-11-14 DIAGNOSIS — G4700 Insomnia, unspecified: Secondary | ICD-10-CM | POA: Insufficient documentation

## 2014-11-14 DIAGNOSIS — S0182XA Laceration with foreign body of other part of head, initial encounter: Secondary | ICD-10-CM | POA: Diagnosis not present

## 2014-11-14 DIAGNOSIS — Z79899 Other long term (current) drug therapy: Secondary | ICD-10-CM | POA: Insufficient documentation

## 2014-11-14 DIAGNOSIS — E663 Overweight: Secondary | ICD-10-CM | POA: Insufficient documentation

## 2014-11-14 DIAGNOSIS — S0181XA Laceration without foreign body of other part of head, initial encounter: Secondary | ICD-10-CM

## 2014-11-14 DIAGNOSIS — G500 Trigeminal neuralgia: Secondary | ICD-10-CM | POA: Diagnosis not present

## 2014-11-14 DIAGNOSIS — G43009 Migraine without aura, not intractable, without status migrainosus: Secondary | ICD-10-CM | POA: Diagnosis not present

## 2014-11-14 DIAGNOSIS — S60511A Abrasion of right hand, initial encounter: Secondary | ICD-10-CM | POA: Diagnosis not present

## 2014-11-14 DIAGNOSIS — S60512A Abrasion of left hand, initial encounter: Secondary | ICD-10-CM | POA: Insufficient documentation

## 2014-11-14 DIAGNOSIS — S01512A Laceration without foreign body of oral cavity, initial encounter: Secondary | ICD-10-CM

## 2014-11-14 HISTORY — DX: Trigeminal neuralgia: G50.0

## 2014-11-14 HISTORY — PX: OTHER SURGICAL HISTORY: SHX169

## 2014-11-14 MED ORDER — ONDANSETRON HCL 4 MG/2ML IJ SOLN
4.0000 mg | INTRAMUSCULAR | Status: DC | PRN
  Administered 2014-11-14: 4 mg via INTRAVENOUS
  Filled 2014-11-14: qty 2

## 2014-11-14 MED ORDER — FENTANYL CITRATE 0.05 MG/ML IJ SOLN
100.0000 ug | INTRAMUSCULAR | Status: AC | PRN
  Administered 2014-11-14 (×4): 100 ug via INTRAVENOUS
  Filled 2014-11-14 (×4): qty 2

## 2014-11-14 MED ORDER — LIDOCAINE-EPINEPHRINE 1 %-1:100000 IJ SOLN
10.0000 mL | Freq: Once | INTRAMUSCULAR | Status: DC
  Filled 2014-11-14: qty 1

## 2014-11-14 MED ORDER — CEFAZOLIN SODIUM 1-5 GM-% IV SOLN
1.0000 g | Freq: Once | INTRAVENOUS | Status: AC
  Administered 2014-11-14: 1 g via INTRAVENOUS
  Filled 2014-11-14: qty 50

## 2014-11-14 MED ORDER — LIDOCAINE-EPINEPHRINE (PF) 2 %-1:200000 IJ SOLN
20.0000 mL | Freq: Once | INTRAMUSCULAR | Status: AC
  Administered 2014-11-14: 20 mL
  Filled 2014-11-14: qty 20

## 2014-11-14 MED ORDER — TETANUS-DIPHTH-ACELL PERTUSSIS 5-2.5-18.5 LF-MCG/0.5 IM SUSP
0.5000 mL | Freq: Once | INTRAMUSCULAR | Status: AC
  Administered 2014-11-14: 0.5 mL via INTRAMUSCULAR
  Filled 2014-11-14: qty 0.5

## 2014-11-14 NOTE — ED Notes (Signed)
Pt transported to CT ?
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
## 2014-11-14 NOTE — ED Notes (Signed)
Dr. Rachael Fee at bedside to debride and suture wound.

## 2014-11-15 MED ORDER — CLINDAMYCIN HCL 300 MG PO CAPS: 300.0000 mg | ORAL_CAPSULE | Freq: Three times a day (TID) | ORAL | Status: DC

## 2014-11-15 MED ORDER — CHLORHEXIDINE GLUCONATE 0.12 % MT SOLN: 15.0000 mL | Freq: Two times a day (BID) | OROMUCOSAL | Status: DC

## 2014-11-15 MED ORDER — OXYCODONE-ACETAMINOPHEN 5-325 MG PO TABS: 1.0000 | ORAL_TABLET | Freq: Four times a day (QID) | ORAL | Status: DC | PRN
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
## 2014-11-15 NOTE — Discharge Instructions (Signed)
Facial Laceration  A facial laceration is a cut on the face. These injuries can be painful and cause bleeding. Lacerations usually heal quickly, but they need special care to reduce scarring. DIAGNOSIS  Your health care provider will take a medical history, ask for details about how the injury occurred, and examine the wound to determine how deep the cut is. TREATMENT  Some facial lacerations may not require closure. Others may not be able to be closed because of an increased risk of infection. The risk of infection and the chance for successful closure will depend on various factors, including the amount of time since the injury occurred. The wound may be cleaned to help prevent infection. If closure is appropriate, pain medicines may be given if needed. Your health care provider will use stitches (sutures), wound glue (adhesive), or skin adhesive strips to repair the laceration. These tools bring the skin edges together to allow for faster healing and a better cosmetic outcome. If needed, you may also be given a tetanus shot. HOME CARE INSTRUCTIONS  Only take over-the-counter or prescription medicines as directed by your health care provider.  Follow your health care provider's instructions for wound care. These instructions will vary depending on the technique used for closing the wound. For Sutures:  Keep the wound clean and dry.   If you were given a bandage (dressing), you should change it at least once a day. Also change the dressing if it becomes wet or dirty, or as directed by your health care provider.   Wash the wound with soap and water 2 times a day. Rinse the wound off with water to remove all soap. Pat the wound dry with a clean towel.   After cleaning, apply a thin layer of the antibiotic ointment recommended by your health care provider. This will help prevent infection and keep the dressing from sticking.   You may shower as usual after the first 24 hours. Do not soak the  wound in water until the sutures are removed.   Get your sutures removed as directed by your health care provider. With facial lacerations, sutures should usually be taken out after 4-5 days to avoid stitch marks.   Wait a few days after your sutures are removed before applying any makeup. For Skin Adhesive Strips:  Keep the wound clean and dry.   Do not get the skin adhesive strips wet. You may bathe carefully, using caution to keep the wound dry.   If the wound gets wet, pat it dry with a clean towel.   Skin adhesive strips will fall off on their own. You may trim the strips as the wound heals. Do not remove skin adhesive strips that are still stuck to the wound. They will fall off in time.  For Wound Adhesive:  You may briefly wet your wound in the shower or bath. Do not soak or scrub the wound. Do not swim. Avoid periods of heavy sweating until the skin adhesive has fallen off on its own. After showering or bathing, gently pat the wound dry with a clean towel.   Do not apply liquid medicine, cream medicine, ointment medicine, or makeup to your wound while the skin adhesive is in place. This may loosen the film before your wound is healed.   If a dressing is placed over the wound, be careful not to apply tape directly over the skin adhesive. This may cause the adhesive to be pulled off before the wound is healed.   Avoid   prolonged exposure to sunlight or tanning lamps while the skin adhesive is in place.  The skin adhesive will usually remain in place for 5-10 days, then naturally fall off the skin. Do not pick at the adhesive film.  After Healing: Once the wound has healed, cover the wound with sunscreen during the day for 1 full year. This can help minimize scarring. Exposure to ultraviolet light in the first year will darken the scar. It can take 1-2 years for the scar to lose its redness and to heal completely.  SEEK IMMEDIATE MEDICAL CARE IF:  You have redness, pain, or  swelling around the wound.   You see ayellowish-white fluid (pus) coming from the wound.   You have chills or a fever.  MAKE SURE YOU:  Understand these instructions.  Will watch your condition.  Will get help right away if you are not doing well or get worse. Document Released: 09/23/2004 Document Revised: 06/06/2013 Document Reviewed: 03/29/2013 [employer] Patient Information 2015 [employer]. This information is not intended to replace advice given to you by your health care provider. Make sure you discuss any questions you have with your health care provider.  

## 2015-01-05 ENCOUNTER — Other Ambulatory Visit: Payer: Self-pay | Admitting: Neurology

## 2015-02-20 ENCOUNTER — Telehealth: Payer: Self-pay | Admitting: Neurology

## 2015-02-20 ENCOUNTER — Other Ambulatory Visit: Payer: Self-pay | Admitting: Neurology

## 2015-02-20 NOTE — Telephone Encounter (Signed)
Patient called stating his symptoms of neuralgia are getting worse and he would like another nerve block. Please call and advise. Patient can be reached at [phone].

## 2015-02-20 NOTE — Telephone Encounter (Signed)
Spoke with pt and scheduled appt for 02/25/15 at 11:15am and told pt to arrive at 11:00am.  Pt verbalized understanding.

## 2015-02-20 NOTE — Telephone Encounter (Signed)
Please call and schedule patient for a 30 minute appointment. 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

## 2015-02-21 NOTE — Telephone Encounter (Signed)
Per orders only encounter on 12/16

## 2015-02-25 ENCOUNTER — Telehealth: Payer: Self-pay | Admitting: *Deleted

## 2015-02-25 ENCOUNTER — Encounter: Payer: Self-pay | Admitting: *Deleted

## 2015-02-25 ENCOUNTER — Encounter: Payer: Self-pay | Admitting: Neurology

## 2015-02-25 ENCOUNTER — Ambulatory Visit (INDEPENDENT_AMBULATORY_CARE_PROVIDER_SITE_OTHER): Payer: BC Managed Care – PPO | Admitting: Neurology

## 2015-02-25 VITALS — BP 130/90 | HR 68 | Ht 70.0 in | Wt 194.0 lb

## 2015-02-25 DIAGNOSIS — I7771 Dissection of carotid artery: Secondary | ICD-10-CM

## 2015-02-25 DIAGNOSIS — G4451 Hemicrania continua: Secondary | ICD-10-CM | POA: Diagnosis not present

## 2015-02-25 DIAGNOSIS — M542 Cervicalgia: Secondary | ICD-10-CM | POA: Diagnosis not present

## 2015-02-25 DIAGNOSIS — G4489 Other headache syndrome: Secondary | ICD-10-CM

## 2015-02-25 DIAGNOSIS — R519 Headache, unspecified: Secondary | ICD-10-CM | POA: Insufficient documentation

## 2015-02-25 DIAGNOSIS — R51 Headache: Secondary | ICD-10-CM

## 2015-02-25 MED ORDER — CARBAMAZEPINE ER 200 MG PO TB12: 400.0000 mg | ORAL_TABLET | Freq: Two times a day (BID) | ORAL | Status: DC

## 2015-02-25 MED ORDER — NORTRIPTYLINE HCL 10 MG PO CAPS: 20.0000 mg | ORAL_CAPSULE | Freq: Every day | ORAL | Status: DC

## 2015-02-25 NOTE — Patient Instructions (Signed)
Overall you are doing fairly well but I do want to suggest a few things today:   Remember to drink plenty of fluid, eat healthy meals and do not skip any meals. Try to eat protein with a every meal and eat a healthy snack such as fruit or nuts in between meals. Try to keep a regular sleep-wake schedule and try to exercise daily, particularly in the form of walking, 20-30 minutes a day, if you can.   As far as your medications are concerned, I would like to suggest: Nortriptyline  at night. Can increase to  at night as tolerated and needed.   As far as diagnostic testing: CTA neck  I would like to see you back as needed, sooner if we need to. Please call us with any interim questions, concerns, problems, updates or refill requests.   Please also call us for any test results so we can go over those with you on the phone.  My clinical assistant and will answer any of your questions and relay your messages to me and also relay most of my messages to you.   Our phone number is [phone]. We also have an after hours call service for urgent matters and there is a physician on-call for urgent questions. For any emergencies you know to call 911 or go to the nearest emergency room

## 2015-02-25 NOTE — Telephone Encounter (Signed)
Spoke with pt about CTA neck normal results. Pt verbalized understanding.

## 2015-02-25 NOTE — Progress Notes (Signed)
[facility]    Provider:  Dr Lucia Gaskins Referring Provider: Sigmund Hazel, MD Primary Care Physician:  Neldon Labella, MD  CC:  Occipital neuralgia  HPI:  Juan Clarke is a 34 y.o. male here as a follow up for occipital neuralgia. He was first seen in November 2015 and diagnosed with occipital neuralgia due to brief, intense lightning bolt pain in the occipital areas radiating to the apex of the head. Up to 30 times a day. Bilateral. Pain bilateral and was located in the distribution of the greater, lesser and/or third occipital nerves, paroxysmal and brief, painful, sharp, with tenderness and trigger points at the emergence of the greater occipital nerve. Tegretol and neurontin eased the pain minimally. He responded slightly to occipital nerve blocks. 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   MRI brain and C-spine 06/2014:  The brain parenchyma shows no abnormal signal characteristics. No structural lesion, tumor infarcts are noted. The paranasal sinuses show mild chronic inflammatory changes.No abnormal lesions are seen on diffusion-weighted views to suggest acute ischemia. The cortical sulci, fissures and cisterns are normal in size and appearance. Lateral, third and fourth ventricle are normal in size and appearance. No extra-axial fluid collections are seen. No evidence of mass effect or midline shift. No abnormal lesions are seen on post contrast views. On sagittal views the posterior fossa, pituitary gland and corpus callosum are unremarkable. No evidence of intracranial hemorrhage on gradient-echo views. The orbits and their contents, paranasal sinuses and calvarium are  unremarkable. Intracranial flow voids are present. IMPRESSION: Unremarkable MRI scan of the brain with and without contrast. Incidental findings of chronic paranasal sinusitis are noted. C-spine:  The cervical vertebrae demonstrate abnormal alignment with loss of forward lordotic curvature and posterior subluxation of C3, C4 and C5 or C2 vertebrae. Vertebral body heights and marrow signal characteristics appear normal. The intervertebral discs show normal signal except at C5-6 where there is some loss of disc height and lateral disc bulge resulting in mild by foraminal narrowing but without significant compression. The spinal cord parenchyma shows normal signal characteristics. Postcontrast images do not result in abnormal areas of enhancement. The visualized portion of the lower brainstem, cerebellum, craniovertebral junction and upper thoracic spine appears unremarkable. IMPRESSION: Slightly abnormal MRI scan of the cervical spine showing mild spondylitic change at C5-6 with mild bi-foraminal narrowing but without significant compression.   Interval Update 02/25/2015: Today he returns for occipital and trigeminal nerve blocks to see if this will help.His headache syndrome is unclear The indomethacin was helping a little but he is having some GI upset so stopped. Still on the tegretol and it helps but not taking away all the pain. He is having pain in the neck now. The neck pain started in the night and was acute, at the jawline, very tender with radiating pain. He didn't sleep funny, just started feeling tenderness in the neck. Persistent. (points to the right carotid artery). No other new neurologic symptoms, just tenderness in the temple area with radiation from the neck. He is taking aspirin every day. CTA of the neck ruled out carotid dissection.  He still has pain in the occipital areas, tenderness and soreness to palpation, pain radiates to the temple areas. He is also having the same shooting  pains in  the occipital areas.Occasional shooting pains in the distribution of the ears and also a persistent tension-type pressure headache.  He wakes up with the pain.  Tenderness in the scalp area. Still having the sharp shooting pains in the occipital areas but also shooting pains in the temporal-parietal areas.Bilateral sharp, stabbing, quick bursts of pain in the temporal-occipital-parietal areas. This happens several times a day. Brief, can happen 10-15 times on a bad day.The indocin helped the autonomic issues and they have not come back since he stopped the indomethacin.   Discussed starting a TCA at night.  Review of Systems: Patient complains of symptoms per HPI as well as the following symptoms: no CP, no SOB. Pertinent negatives per HPI. All others negative.   History   Social History  . Marital Status: Married    Spouse Name: Brayton Caves  . Number of Children: 0  . Years of Education: 12+   Occupational History  . Teacher     Social History Main Topics  . Smoking status: Never Smoker   . Smokeless tobacco: Never Used  . Alcohol Use: 0.0 oz/week    0 Standard drinks or equivalent per week     Comment: Occasionally  . Drug Use: Yes    Special: Marijuana  . Sexual Activity: Not on file   Other Topics Concern  . Not on file   Social History Narrative   Patient is married to [place]   Patient has no children.    Patient is a Runner, broadcasting/film/video    Patient has a college education    Family History  Problem Relation Age of Onset  . Hypertension Father   . Migraines Mother   . Mental illness Sister   . Drug abuse Sister   . Brain cancer Maternal Grandmother   . Heart disease Paternal Grandfather     Past Medical History  Diagnosis Date  . Insomnia   . Migraine without aura   . HTN (hypertension)   . Overweight   . GAD (generalized anxiety disorder)   . Trigeminal neuralgia     Past Surgical History  Procedure Laterality Date  . Tonsillectomy      34 yr old  . Face  surgery  11/14/14    Current Outpatient Prescriptions  Medication Sig Dispense Refill  . carbamazepine (TEGRETOL-XR) 200 MG 12 hr tablet Take 2 tablets (400 mg total) by mouth 2 (two) times daily. 180 tablet 3  . clonazePAM (KLONOPIN) 0.5 MG tablet Take 0.5 mg by mouth 2 (two) times daily as needed for anxiety. 1/2 TAB BID  0  . lisinopril-hydrochlorothiazide (PRINZIDE,ZESTORETIC) 10-12.5 MG per tablet Take 1 tablet by mouth daily.   0  . nortriptyline (PAMELOR) 10 MG capsule Take 2 capsules (20 mg total) by mouth at bedtime. 60 capsule 3   No current facility-administered medications for this visit.    Allergies as of 02/25/2015 - Review Complete 02/25/2015  Allergen Reaction Noted  . Lexapro [escitalopram oxalate] Other (See Comments) 07/10/2014  . Zoloft [sertraline hcl] Other (See Comments) 07/10/2014    Vitals: BP 130/90 mmHg  Pulse 68  Ht 5\' 10"  (1.778 m)  Wt 194 lb (87.998 kg)  BMI 27.84 kg/m2  SpO2 97% Last Weight:  Wt Readings from Last 1 Encounters:  02/25/15 194 lb (87.998 kg)   Last Height:   Ht Readings from Last 1 Encounters:  02/25/15 5\' 10"  (1.778 m)      Procedure: Patient was consented for bilateral occipital and bilateral trigeminal nerve blocks. A solution  containing 0.5% 5mg /ml Bupivacaine 4-cc, Lidocaine 2% 4ml  and 80mg  Depo Medrol 2cc was prepared with 30 gauge 1/2 inch needle.   10 Target areas in the suboccipital and temporal regions were identified via palpation and pain response.The sites junctions were sterilized with alcohol wipes. The sites were sterilized with alcohol wipes. 1ml was injected at each temporal and occipital site. The contents of each syringe was injected in a fanlike fashion.  Patient tolerated the procedure well and no complications were noted.    Consent was provided below and patient acknowledged understanding:   Depo-Medrol 80mg /ml [facility] 4098-1191-47  Expiration date: 12/2015 Lot number: [id number]  Bupivicaine 50mg /84ml NDC  13086-578-46  Expiration date: 11/29/2015 Lot number: [id number]  Lidocaine 2% NDC 96295-284-13  Expiration date: 12/16 Lot number: [id number]   What to expect afterwards?  Immediately after the injection, the back of your head may feel warm and numb. You may also experience reduction in the pain. The local anaesthetic wears off in a few hours and the steroid usually takes  3-7 days to take effect.   The pain relief is vary variable and can last from a few days to several months. Some patients do not experience any pain relief. Hence it is difficult to predict the outcome of the injection treatment in a particular patient.   There may be some discomfort at the injection site for a couple of days after treatment, however, this should settle quite quickly. We advise you to take things easy for the rest of the day. Continue taking your pain medication as advised by your consultant or until you feel benefit from the treatment.   What are the side effects / complications?  Common   Soreness / bruising at the injection site.   Temporary increase (up to 7 days) in pain following procedure.   Rare   Bleeding   Infection at the injection site   Allergic reaction   New pain   Worsening pain   Plan:  Will start Nortriptyline 10mg  at night Will refer to [facility] for evaluation for unclear headache syndrome.   Naomie Dean, MD  [facility] [address] [place], [place] [address]  Phone [phone] Fax [phone]  A total of 45 minutes was spent face-to-face with this patient. Over half this time was spent on counseling patient on the occipital neuralgia vs trigeminal autonomic cephalgia vs hemicranial continua diagnosis and different diagnostic and therapeutic options available.

## 2015-02-25 NOTE — Telephone Encounter (Signed)
Spoke with Kirt Boys from [facility] and she gave me report on CTA neck. Stated results were normal. "Unremarkable CTA neck" Asked her to fax results to [phone]. She verbalized understanding.

## 2015-02-25 NOTE — Telephone Encounter (Signed)
Imaging normal, no dissection or carotid lesion.

## 2015-05-13 ENCOUNTER — Other Ambulatory Visit: Payer: Self-pay | Admitting: Cardiology

## 2015-05-13 ENCOUNTER — Ambulatory Visit
Admission: RE | Admit: 2015-05-13 | Discharge: 2015-05-13 | Disposition: A | Discharge status: Home / Self Care | Payer: BC Managed Care – PPO | Source: Ambulatory Visit | Attending: Cardiology | Admitting: Cardiology

## 2015-05-13 DIAGNOSIS — R0602 Shortness of breath: Secondary | ICD-10-CM

## 2016-05-17 ENCOUNTER — Other Ambulatory Visit: Payer: Self-pay | Admitting: Neurology

## 2016-11-02 ENCOUNTER — Emergency Department (HOSPITAL_COMMUNITY): Payer: Worker's Compensation

## 2016-11-02 ENCOUNTER — Encounter (HOSPITAL_COMMUNITY): Payer: Self-pay

## 2016-11-02 ENCOUNTER — Emergency Department (HOSPITAL_COMMUNITY)
Admission: EM | Admit: 2016-11-02 | Discharge: 2016-11-02 | Disposition: A | Discharge status: Home / Self Care | Payer: Worker's Compensation | Attending: Emergency Medicine | Admitting: Emergency Medicine

## 2016-11-02 DIAGNOSIS — Z79899 Other long term (current) drug therapy: Secondary | ICD-10-CM | POA: Diagnosis not present

## 2016-11-02 DIAGNOSIS — Y9389 Activity, other specified: Secondary | ICD-10-CM | POA: Diagnosis not present

## 2016-11-02 DIAGNOSIS — Y92219 Unspecified school as the place of occurrence of the external cause: Secondary | ICD-10-CM | POA: Diagnosis not present

## 2016-11-02 DIAGNOSIS — S4992XA Unspecified injury of left shoulder and upper arm, initial encounter: Secondary | ICD-10-CM | POA: Diagnosis present

## 2016-11-02 DIAGNOSIS — W1839XA Other fall on same level, initial encounter: Secondary | ICD-10-CM | POA: Insufficient documentation

## 2016-11-02 DIAGNOSIS — S43015A Anterior dislocation of left humerus, initial encounter: Secondary | ICD-10-CM | POA: Diagnosis not present

## 2016-11-02 DIAGNOSIS — Y990 Civilian activity done for income or pay: Secondary | ICD-10-CM | POA: Diagnosis not present

## 2016-11-02 DIAGNOSIS — I10 Essential (primary) hypertension: Secondary | ICD-10-CM | POA: Diagnosis not present

## 2016-11-02 DIAGNOSIS — S43005A Unspecified dislocation of left shoulder joint, initial encounter: Secondary | ICD-10-CM

## 2016-11-02 MED ORDER — HYDROCODONE-ACETAMINOPHEN 5-325 MG PO TABS: 1.0000 | ORAL_TABLET | Freq: Four times a day (QID) | ORAL | 0 refills | Status: AC | PRN

## 2016-11-02 MED ORDER — ETOMIDATE 2 MG/ML IV SOLN
10.0000 mg | Freq: Once | INTRAVENOUS | Status: AC
  Administered 2016-11-02: 10 mg via INTRAVENOUS
  Filled 2016-11-02: qty 10

## 2016-11-02 MED ORDER — HYDROMORPHONE HCL 2 MG/ML IJ SOLN
1.0000 mg | Freq: Once | INTRAMUSCULAR | Status: AC
  Administered 2016-11-02: 1 mg via INTRAVENOUS
  Filled 2016-11-02: qty 1

## 2016-11-02 MED ORDER — OXYCODONE-ACETAMINOPHEN 5-325 MG PO TABS
ORAL_TABLET | ORAL | Status: AC
  Administered 2016-11-02: 1
  Filled 2016-11-02: qty 1

## 2016-11-02 MED ORDER — MIDAZOLAM HCL 2 MG/2ML IJ SOLN
2.0000 mg | Freq: Once | INTRAMUSCULAR | Status: AC
  Administered 2016-11-02: 1 mg via INTRAVENOUS
  Filled 2016-11-02: qty 2

## 2016-11-02 MED ORDER — OXYCODONE-ACETAMINOPHEN 5-325 MG PO TABS: 1.0000 | ORAL_TABLET | ORAL | Status: DC | PRN
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
## 2016-11-02 NOTE — ED Notes (Signed)
Sedation is complete spoke with radiology regarding x-ray pt remains awake and alert

## 2016-11-02 NOTE — Sedation Documentation (Signed)
ER MD and PA at [facility] for procedure pt responds to pain 1mg  IV versed given to total the 2mg  dose
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
## 2016-11-02 NOTE — Sedation Documentation (Signed)
Procedure complete shoulder immobilizer placed on pt.  Pt fully awake and remains on the monitor

## 2016-11-10 ENCOUNTER — Encounter (HOSPITAL_COMMUNITY): Payer: Self-pay | Admitting: *Deleted

## 2016-11-10 ENCOUNTER — Emergency Department (HOSPITAL_COMMUNITY)
Admission: EM | Admit: 2016-11-10 | Discharge: 2016-11-10 | Disposition: A | Discharge status: Home / Self Care | Payer: Worker's Compensation | Attending: Emergency Medicine | Admitting: Emergency Medicine

## 2016-11-10 ENCOUNTER — Emergency Department (HOSPITAL_COMMUNITY): Payer: Worker's Compensation

## 2016-11-10 DIAGNOSIS — I10 Essential (primary) hypertension: Secondary | ICD-10-CM | POA: Diagnosis not present

## 2016-11-10 DIAGNOSIS — Y939 Activity, unspecified: Secondary | ICD-10-CM | POA: Insufficient documentation

## 2016-11-10 DIAGNOSIS — X501XXA Overexertion from prolonged static or awkward postures, initial encounter: Secondary | ICD-10-CM | POA: Diagnosis not present

## 2016-11-10 DIAGNOSIS — Y929 Unspecified place or not applicable: Secondary | ICD-10-CM | POA: Diagnosis not present

## 2016-11-10 DIAGNOSIS — Y999 Unspecified external cause status: Secondary | ICD-10-CM | POA: Diagnosis not present

## 2016-11-10 DIAGNOSIS — Z79899 Other long term (current) drug therapy: Secondary | ICD-10-CM | POA: Insufficient documentation

## 2016-11-10 DIAGNOSIS — S43005A Unspecified dislocation of left shoulder joint, initial encounter: Secondary | ICD-10-CM | POA: Diagnosis not present

## 2016-11-10 DIAGNOSIS — S4992XA Unspecified injury of left shoulder and upper arm, initial encounter: Secondary | ICD-10-CM | POA: Diagnosis present

## 2016-11-10 MED ORDER — OXYCODONE-ACETAMINOPHEN 5-325 MG PO TABS
1.0000 | ORAL_TABLET | ORAL | 0 refills | Status: DC | PRN
Start: 2016-11-10 — End: 2016-12-16

## 2016-11-10 MED ORDER — FENTANYL CITRATE (PF) 100 MCG/2ML IJ SOLN
100.0000 ug | Freq: Once | INTRAMUSCULAR | Status: AC
  Administered 2016-11-10: 100 ug via INTRAVENOUS
  Filled 2016-11-10: qty 2

## 2016-11-10 NOTE — ED Triage Notes (Signed)
Pt rolled over in bed tonight and felt a pop in L shoulder. Pt was here last week for a dislocated shoulder. Pt appears uncomfortable in triage.

## 2016-11-10 NOTE — ED Notes (Signed)
Pt returned from xray
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
## 2016-11-10 NOTE — ED Notes (Signed)
Patient transported to X-ray 

## 2016-11-10 NOTE — Discharge Instructions (Signed)
Recommend to wear your sling at all times for the next few days to prevent recurrent dislocation.  Use caution when moving the left arm around, especially above the head. Use pain medication when needed.  Do not drive while taking this. Follow-up with your orthopedist. Return here for new concerns.

## 2016-12-01 ENCOUNTER — Other Ambulatory Visit: Payer: Self-pay | Admitting: Neurology

## 2016-12-09 ENCOUNTER — Other Ambulatory Visit: Payer: Self-pay | Admitting: Neurology

## 2016-12-09 ENCOUNTER — Telehealth: Payer: Self-pay | Admitting: Neurology

## 2016-12-09 MED ORDER — CARBAMAZEPINE ER 200 MG PO TB12: 400.0000 mg | ORAL_TABLET | Freq: Two times a day (BID) | ORAL | 0 refills | Status: DC

## 2016-12-09 NOTE — Addendum Note (Signed)
Addended by: Donnelly Angelica on: 12/09/2016 03:23 PM   Modules accepted: Orders

## 2016-12-09 NOTE — Telephone Encounter (Signed)
Refill e-scribed to pt's pharmacy. F/u appt scheduled for next Thurs w/ Dr. Lucia Gaskins.

## 2016-12-09 NOTE — Telephone Encounter (Signed)
Pt called today said he is out of thecarbamazepine (TEGRETOL XR) 200 MG 12 hr tablet.  Pt said he was not aware he needed to be seen yearly for refills. He said pharmacy did not relay the msg on 4/4.  Please call to get him worked in. 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 Please call work # 1st, his cell is [phone]

## 2016-12-16 ENCOUNTER — Encounter (INDEPENDENT_AMBULATORY_CARE_PROVIDER_SITE_OTHER): Payer: Self-pay

## 2016-12-16 ENCOUNTER — Ambulatory Visit (INDEPENDENT_AMBULATORY_CARE_PROVIDER_SITE_OTHER): Payer: BC Managed Care – PPO | Admitting: Neurology

## 2016-12-16 ENCOUNTER — Encounter: Payer: Self-pay | Admitting: *Deleted

## 2016-12-16 VITALS — BP 145/98 | HR 96 | Ht 71.0 in | Wt 203.0 lb

## 2016-12-16 DIAGNOSIS — M792 Neuralgia and neuritis, unspecified: Secondary | ICD-10-CM | POA: Diagnosis not present

## 2016-12-16 MED ORDER — INDOMETHACIN 50 MG PO CAPS: 50.0000 mg | ORAL_CAPSULE | Freq: Two times a day (BID) | ORAL | 12 refills | Status: DC

## 2016-12-16 MED ORDER — CARBAMAZEPINE ER 200 MG PO TB12: 400.0000 mg | ORAL_TABLET | Freq: Two times a day (BID) | ORAL | 6 refills | Status: DC

## 2016-12-16 NOTE — Progress Notes (Signed)
[facility]    Provider:  Dr Lucia Gaskins Referring Provider: Sigmund Hazel, MD Primary Care Physician:  Neldon Labella, MD  CC:  Occipital neuralgia  Interval History 12/16/2016:  He still has headaches. He has shooting pains. He has a creeping numbness sensation on the top of the head and pressure with dullness, he still has sharp pains but more in the vertex, well controlled by the tegretal. It can be daily. No autonomic symptoms. Tegretol helps.   HPI:  Juan Clarke is a 36 y.o. male here as a follow up for occipital neuralgia. He was first seen in November 2015 and diagnosed with occipital neuralgia due to brief, intense lightning bolt pain in the occipital areas radiating to the apex of the head. Up to 30 times a day. Bilateral. Pain bilateral and was located in the distribution of the greater, lesser and/or third occipital nerves, paroxysmal and brief, painful, sharp, with tenderness and trigger points at the emergence of the greater occipital nerve. Tegretol and neurontin eased the pain minimally. He responded slightly to occipital nerve blocks. 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   MRI brain and C-spine 06/2014:  The brain parenchyma shows no abnormal signal characteristics. No structural lesion, tumor infarcts are noted. The paranasal sinuses show mild chronic inflammatory changes.No abnormal lesions are seen on diffusion-weighted views to suggest acute ischemia. The cortical sulci, fissures and cisterns are normal in size and appearance. Lateral, third and fourth ventricle are normal in size and appearance. No extra-axial fluid collections are seen. No evidence of mass  effect or midline shift. No abnormal lesions are seen on post contrast views. On sagittal views the posterior fossa, pituitary gland and corpus callosum are unremarkable. No evidence of intracranial hemorrhage on gradient-echo views. The orbits and their contents, paranasal sinuses and calvarium are unremarkable. Intracranial flow voids are present. IMPRESSION: Unremarkable MRI scan of the brain with and without contrast. Incidental findings of chronic paranasal sinusitis are noted. C-spine:  The cervical vertebrae demonstrate abnormal alignment with loss of forward lordotic curvature and posterior subluxation of C3, C4 and C5 or C2 vertebrae. Vertebral body heights and marrow signal characteristics appear normal. The intervertebral discs show normal signal except at C5-6 where there is some loss of disc height and lateral disc bulge resulting in mild by foraminal narrowing but without significant compression. The spinal cord parenchyma shows normal signal characteristics. Postcontrast images do not result in abnormal areas of enhancement. The visualized portion of the lower brainstem, cerebellum, craniovertebral junction and upper thoracic spine appears unremarkable. IMPRESSION: Slightly abnormal MRI scan of the cervical spine showing mild spondylitic change at C5-6 with mild bi-foraminal narrowing but without significant compression.   Interval Update 02/25/2015: Today he returns for occipital and trigeminal nerve blocks to see if this will help.His headache syndrome is unclear The indomethacin was helping a little but he is having some GI upset so stopped. Still on the tegretol and it helps but not taking away all the pain. He is having pain in the neck now. The neck pain started in the night and was acute, at the jawline, very tender with radiating pain. He didn't sleep funny, just started feeling tenderness in the neck. Persistent. (points to the right carotid artery). No other new neurologic  symptoms,  just tenderness in the temple area with radiation from the neck. He is taking aspirin every day. CTA of the neck ruled out carotid dissection.  He still has pain in the occipital areas, tenderness and soreness to palpation, pain radiates to the temple areas. He is also having the same shooting pains in the occipital areas.Occasional shooting pains in the distribution of the ears and also a persistent tension-type pressure headache.  He wakes up with the pain.  Tenderness in the scalp area. Still having the sharp shooting pains in the occipital areas but also shooting pains in the temporal-parietal areas.Bilateral sharp, stabbing, quick bursts of pain in the temporal-occipital-parietal areas. This happens several times a day. Brief, can happen 10-15 times on a bad day.The indocin helped the autonomic issues and they have not come back since he stopped the indomethacin.   Discussed starting a TCA at night.  07/2014: HPI: Juan Clarke is a 36 y.o. male here as a referral from Dr. Hyacinth Meeker for headache. He has random shooting pains in the crown of head. They are disorienting and they happen 5 times in an hour at least. Sometimes if he bends his neck forward there is an electrical shooting pain down his spine. Sometimes shooting pain in the bilateral temples. Sometimes eyes hurt. Nose is constantly running and gets congested when the symtpoms start. Symptoms are every day. They are brief, intense, lightning bolts, severe 10/10. Drinks 2 drinks a night. Symptoms started years ago. He started having a burning sensation in his head. Burning in the back of the head. 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 Up to 30 times a day now or more. Brief. Random. No tenderness. No neck pain. Happens on both sides but not usually together. He has electric pain down the neck. Unclear what the triggers are andnothing makes it better. No weakness. No other focal neurologic  symptoms. No FHx of neuromuscular, neurodegenerative, rheumatologic disorders.  Interval History: The Carbemazepine helped with the symptoms. He had an ED visit for eye swelling and eye numbness which resolved. He is still having lightning bolt/shooting pain in the back of the head which radiate to the crown of the head. Sometimes occurs in the bilateral temples. +Rhinorrhea with the symptoms. Severe 10/10 when the pain happens. The Tegretol helps but doesn't last all day, around 4pm the symptoms come back and they are just as bad. He is getting tremors from the medication. He feels tired all the time. Has anxiety that is chronic and takes Klonopin daily.    Appointment November 11th:  Juan Clarke is a 36 y.o. male here as a referral from Dr. Hyacinth Meeker for headache. He has random shooting pains in the crown of head. They are disorienting and they happen 5 times in an hour at least. Sometimes if he bends his neck forward there is an electrical shooting pain down his spine. Sometimes shooting pain in the bilateral temples. Sometimes eyes hurt. Nose is constantly running and gets congested when the symtpoms start. Symptoms are every day. They are brief, intense, lightning bolts, severe 10/10. Drinks 2 drinks a night. Symptoms started years ago. He started having a burning sensation in his head. Burning in the back of the head. 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 Up to 30 times a day now or more. Brief. Random. No tenderness. No neck pain. Happens on both sides  but not usually together. He has electric pain down the neck. Unclear what the triggers are andnothing makes it better. No weakness. No other focal neurologic symptoms. No FHx of neuromuscular, neurodegenerative, rheumatologic disorders.   Review of Systems: Patient complains of symptoms per HPI as well as the following symptoms: no CP, no SOB. Pertinent negatives per HPI. All others negative.  Social  History   Social History  . Marital status: Married    Spouse name: Brayton Caves  . Number of children: 0  . Years of education: 12+   Occupational History  . Teacher     Social History Main Topics  . Smoking status: Never Smoker  . Smokeless tobacco: Never Used  . Alcohol use 0.0 oz/week     Comment: Occasionally  . Drug use: Yes    Types: Marijuana  . Sexual activity: Not on file   Other Topics Concern  . Not on file   Social History Narrative   Patient is married to [place]   Patient has no children.    Patient is a Runner, broadcasting/film/video    Patient has a college education    Family History  Problem Relation Age of Onset  . Migraines Mother   . Hypertension Father   . Mental illness Sister   . Drug abuse Sister   . Brain cancer Maternal Grandmother   . Heart disease Paternal Grandfather     Past Medical History:  Diagnosis Date  . GAD (generalized anxiety disorder)   . HTN (hypertension)   . Insomnia   . Migraine without aura   . Overweight   . Trigeminal neuralgia     Past Surgical History:  Procedure Laterality Date  . Face surgery  11/14/14  . TONSILLECTOMY     36 yr old    Current Outpatient Prescriptions  Medication Sig Dispense Refill  . carbamazepine (TEGRETOL XR) 200 MG 12 hr tablet Take 2 tablets (400 mg total) by mouth 2 (two) times daily. 120 tablet 0  . clonazePAM (KLONOPIN) 0.5 MG tablet Take 0.5 mg by mouth 2 (two) times daily as needed for anxiety.   0  . diphenhydrAMINE (BENADRYL) 25 MG tablet Take 50 mg by mouth every 8 (eight) hours as needed for sleep.    Marland Kitchen ibuprofen (ADVIL,MOTRIN) 200 MG tablet Take 400 mg by mouth every 6 (six) hours as needed for moderate pain.     Marland Kitchen lisinopril-hydrochlorothiazide (PRINZIDE,ZESTORETIC) 10-12.5 MG per tablet Take 1 tablet by mouth daily.   0   No current facility-administered medications for this visit.     Allergies as of 12/16/2016 - Review Complete 12/16/2016  Allergen Reaction Noted  . Lexapro [escitalopram  oxalate] Other (See Comments) 07/10/2014  . Zoloft [sertraline hcl] Other (See Comments) 07/10/2014    Vitals: BP (!) 145/98   Pulse 96   Ht 5\' 11"  (1.803 m)   Wt 203 lb (92.1 kg)   BMI 28.31 kg/m  Last Weight:  Wt Readings from Last 1 Encounters:  12/16/16 203 lb (92.1 kg)   Last Height:   Ht Readings from Last 1 Encounters:  12/16/16 5\' 11"  (1.803 m)   Physical exam: Exam: Gen: NAD, conversant, well nourised, obese, well groomed                     CV: RRR, no MRG. No Carotid Bruits. No peripheral edema, warm, nontender Eyes: Conjunctivae clear without exudates or hemorrhage  Neuro: Detailed Neurologic Exam  Speech:    Speech is normal;  fluent and spontaneous with normal comprehension.  Cognition:    The patient is oriented to person, place, and time;     recent and remote memory intact;     language fluent;     normal attention, concentration,     fund of knowledge Cranial Nerves:    The pupils are equal, round, and reactive to light. The fundi are normal and spontaneous venous pulsations are present. Visual fields are full to finger confrontation. Extraocular movements are intact. Trigeminal sensation is intact and the muscles of mastication are normal. The face is symmetric. The palate elevates in the midline. Hearing intact. Voice is normal. Shoulder shrug is normal. The tongue has normal motion without fasciculations.   Coordination:    Normal finger to nose and heel to shin. Normal rapid alternating movements.   Gait:    Heel-toe and tandem gait are normal.   Motor Observation:    No asymmetry, no atrophy, and no involuntary movements noted. Tone:    Normal muscle tone.    Posture:    Posture is normal. normal erect    Strength:    Strength is V/V in the upper and lower limbs.      Sensation: intact to LT     Reflex Exam:  DTR's:    Deep tendon reflexes in the upper and lower extremities are normal bilaterally.   Toes:    The toes are downgoing  bilaterally.   Clonus:    Clonus is absent.    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 MRi of the brain and c-spine unremarkable. Tegretol is working, continue with prn indomethacin  Naomie Dean, MD  [facility] [address] [place], [place] [address]  Phone [phone] Fax [phone]  A total of 25 minutes was spent face-to-face with this patient. Over half this time was spent on counseling patient on the atypical neuralgia diagnosis and different diagnostic and therapeutic options available.

## 2016-12-16 NOTE — Patient Instructions (Signed)
Remember to drink plenty of fluid, eat healthy meals and do not skip any meals. Try to eat protein with a every meal and eat a healthy snack such as fruit or nuts in between meals. Try to keep a regular sleep-wake schedule and try to exercise daily, particularly in the form of walking, 20-30 minutes a day, if you can.   As far as your medications are concerned, I would like to suggest: Continue current medications  I would like to see you back as needed, sooner if we need to. Please call us with any interim questions, concerns, problems, updates or refill requests.   Our phone number is [phone]. We also have an after hours call service for urgent matters and there is a physician on-call for urgent questions. For any emergencies you know to call 911 or go to the nearest emergency room   

## 2017-06-13 ENCOUNTER — Emergency Department (HOSPITAL_COMMUNITY)
Admission: EM | Admit: 2017-06-13 | Discharge: 2017-06-13 | Disposition: A | Discharge status: Home / Self Care | Payer: BC Managed Care – PPO | Attending: Emergency Medicine | Admitting: Emergency Medicine

## 2017-06-13 ENCOUNTER — Encounter (HOSPITAL_COMMUNITY): Payer: Self-pay | Admitting: Emergency Medicine

## 2017-06-13 DIAGNOSIS — R5383 Other fatigue: Secondary | ICD-10-CM | POA: Diagnosis present

## 2017-06-13 DIAGNOSIS — Z79899 Other long term (current) drug therapy: Secondary | ICD-10-CM | POA: Diagnosis not present

## 2017-06-13 DIAGNOSIS — R42 Dizziness and giddiness: Secondary | ICD-10-CM | POA: Diagnosis not present

## 2017-06-13 DIAGNOSIS — I10 Essential (primary) hypertension: Secondary | ICD-10-CM | POA: Diagnosis not present

## 2017-06-13 LAB — CBC
HCT: 44.4 % (ref 39.0–52.0)
HEMOGLOBIN: 15.4 g/dL (ref 13.0–17.0)
MCH: 31.4 pg (ref 26.0–34.0)
MCHC: 34.7 g/dL (ref 30.0–36.0)
MCV: 90.4 fL (ref 78.0–100.0)
PLATELETS: 153 10*3/uL (ref 150–400)
RBC: 4.91 MIL/uL (ref 4.22–5.81)
RDW: 12.7 % (ref 11.5–15.5)
WBC: 5.1 10*3/uL (ref 4.0–10.5)

## 2017-06-13 LAB — COMPREHENSIVE METABOLIC PANEL
ALBUMIN: 4.3 g/dL (ref 3.5–5.0)
ALK PHOS: 58 U/L (ref 38–126)
ALT: 73 U/L — AB (ref 17–63)
ANION GAP: 10 (ref 5–15)
AST: 79 U/L — ABNORMAL HIGH (ref 15–41)
BILIRUBIN TOTAL: 0.7 mg/dL (ref 0.3–1.2)
BUN: 14 mg/dL (ref 6–20)
CALCIUM: 8.9 mg/dL (ref 8.9–10.3)
CO2: 27 mmol/L (ref 22–32)
CREATININE: 1.08 mg/dL (ref 0.61–1.24)
Chloride: 103 mmol/L (ref 101–111)
GFR calc non Af Amer: >60 mL/min (ref >60)
GLUCOSE: 103 mg/dL — AB (ref 65–99)
Potassium: 4.3 mmol/L (ref 3.5–5.1)
Sodium: 140 mmol/L (ref 135–145)
TOTAL PROTEIN: 6.6 g/dL (ref 6.5–8.1)

## 2017-06-13 LAB — CBG MONITORING, ED: GLUCOSE-CAPILLARY: 101 mg/dL — AB (ref 65–99)

## 2017-06-13 MED ORDER — DOXYCYCLINE HYCLATE 100 MG PO CAPS: 100.0000 mg | ORAL_CAPSULE | Freq: Two times a day (BID) | ORAL | 0 refills | Status: AC

## 2017-06-13 NOTE — ED Provider Notes (Signed)
[facility]-EMERGENCY DEPT Provider Note   CSN: [id number] Arrival date & time: 06/13/17  4098     History   Chief Complaint Chief Complaint  Patient presents with  . Fatigue  . Altered Mental Status    HPI Juan Clarke is a 36 y.o. male medical history of generalized anxiety disorder, hypertension,migraines, presenting to the ED with acute onset of persistent generalized fatigue that began on Friday. Patient states he feels "out of it", and lightheaded at times where he feels like he could pass out at any second. He states the lightheadedness is not associated with position changes. His wife also reports he is having short-term memory issues regarding events that occurred earlier in the day as well.  Denies headache, vision changes, chest pain, palpitations, shortness of breath, abdominal pain, melena, urinary symptoms, sore throat, congestion, cough, lower extremity edema, facial droop, slurred speech, recent head trauma or falls. Denies new changes in medications. States he did not take his blood pressure medication today. He reports that he drinks about 3 liquor drinks per day.  The history is provided by the spouse and the patient.    Past Medical History:  Diagnosis Date  . GAD (generalized anxiety disorder)   . HTN (hypertension)   . Insomnia   . Migraine without aura   . Overweight   . Trigeminal neuralgia     Patient Active Problem List   Diagnosis Date Noted  . Neck pain 02/25/2015  . Headache 02/25/2015  . 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 Occipital neuralgia 07/13/2014  . Generalized anxiety disorder 07/13/2014    Past Surgical History:  Procedure Laterality Date  . Face surgery  11/14/14  . TONSILLECTOMY     36 yr old       Home Medications    Prior to Admission medications   Medication Sig Start Date End Date Taking? Authorizing Provider  carbamazepine (TEGRETOL XR) 200 MG 12 hr tablet Take 2 tablets (400 mg total) by mouth 2 (two) times  daily. Patient taking differently: Take 200 mg by mouth 2 (two) times daily.  12/16/16  Yes Anson Fret, MD  clonazePAM (KLONOPIN) 0.5 MG tablet Take 0.5 mg by mouth 2 (two) times daily as needed for anxiety.  06/07/14  Yes [provider]  diphenhydrAMINE (BENADRYL) 25 MG tablet Take 50 mg by mouth every 8 (eight) hours as needed for sleep.   Yes [provider]  ibuprofen (ADVIL,MOTRIN) 200 MG tablet Take 400 mg by mouth every 6 (six) hours as needed for moderate pain.    Yes [provider]  lisinopril-hydrochlorothiazide (PRINZIDE,ZESTORETIC) 10-12.5 MG per tablet Take 1 tablet by mouth daily.  06/07/14  Yes [provider]  doxycycline (VIBRAMYCIN) 100 MG capsule Take 1 capsule (100 mg total) by mouth 2 (two) times daily. 06/13/17 06/20/17  Russo, [place] N, PA-C  indomethacin (INDOCIN) 50 MG capsule Take 1 capsule (50 mg total) by mouth 2 (two) times daily with a meal. 12/16/16   Anson Fret, MD    Family History Family History  Problem Relation Age of Onset  . Migraines Mother   . Hypertension Father   . Mental illness Sister   . Drug abuse Sister   . Brain cancer Maternal Grandmother   . Heart disease Paternal Grandfather     Social History Social History  Substance Use Topics  . Smoking status: Never Smoker  . Smokeless tobacco: Never Used  . Alcohol use 0.0 oz/week     Comment: Occasionally  Allergies   Lexapro [escitalopram oxalate] and Zoloft [sertraline hcl]   Review of Systems Review of Systems  Constitutional: Positive for fatigue. Negative for chills and fever.  HENT: Negative for congestion and sore throat.   Eyes: Negative for visual disturbance.  Respiratory: Negative for cough and shortness of breath.   Cardiovascular: Negative for chest pain, palpitations and leg swelling.  Gastrointestinal: Negative for abdominal pain, blood in stool, diarrhea and vomiting.       No melena  Genitourinary: Negative for  dysuria and frequency.  Neurological: Positive for light-headedness. Negative for syncope, facial asymmetry, speech difficulty, weakness and numbness.  Psychiatric/Behavioral: Positive for confusion.     Physical Exam Updated Vital Signs BP 134/88   Pulse 70   Temp 98.1 F (36.7 C) (Oral)   Resp 13   SpO2 100%   Physical Exam  Constitutional: He appears well-developed and well-nourished. No distress.  HENT:  Head: Normocephalic and atraumatic.  Mouth/Throat: Oropharynx is clear and moist.  Eyes: Pupils are equal, round, and reactive to light. Conjunctivae and EOM are normal.  Neck: Normal range of motion. Neck supple. No tracheal deviation present.  No nuchal rigidity.  Cardiovascular: Normal rate, regular rhythm, normal heart sounds and intact distal pulses.  Exam reveals no gallop and no friction rub.   No murmur heard. Pulmonary/Chest: Effort normal and breath sounds normal. No stridor. No respiratory distress. He has no wheezes. He has no rales.  Abdominal: Soft. Bowel sounds are normal. He exhibits no distension and no mass. There is no tenderness. There is no rebound and no guarding.  Musculoskeletal: Normal range of motion.  Lymphadenopathy:    He has no cervical adenopathy.  Neurological: He is alert.  Mental Status:  Alert, oriented, thought content appropriate, able to give a coherent history. Speech fluent without evidence of aphasia. Able to follow 2 step commands without difficulty.  Cranial Nerves:  II:  Peripheral visual fields grossly normal, pupils equal, round, reactive to light III,IV, VI: ptosis not present, extra-ocular motions intact bilaterally  V,VII: smile symmetric, facial light touch sensation equal VIII: hearing grossly normal to voice  X: uvula elevates symmetrically  XI: bilateral shoulder shrug symmetric and strong XII: midline tongue extension without fassiculations Motor:  Normal tone. 5/5 in upper and lower extremities bilaterally including  strong and equal grip strength and dorsiflexion/plantar flexion Sensory: Pinprick and light touch normal in all extremities.  Deep Tendon Reflexes: 2+ and symmetric in the biceps and patella Cerebellar: normal finger-to-nose with bilateral upper extremities Gait: normal gait and balance CV: distal pulses palpable throughout    Skin: Skin is warm. No rash noted.  Psychiatric: He has a normal mood and affect. His behavior is normal.  Nursing note and vitals reviewed.    ED Treatments / Results  Labs (all labs ordered are listed, but only abnormal results are displayed) Labs Reviewed  COMPREHENSIVE METABOLIC PANEL - Abnormal; Notable for the following:       Result Value   Glucose, Bld 103 (*)    AST 79 (*)    ALT 73 (*)    All other components within normal limits  CBG MONITORING, ED - Abnormal; Notable for the following:    Glucose-Capillary 101 (*)    All other components within normal limits  CBC    EKG  EKG Interpretation  Date/Time:  Monday June 13 2017 11:16:41 EDT Ventricular Rate:  76 PR Interval:    QRS Duration: 98 QT Interval:  373 QTC Calculation: 420 R  Axis:   80 Text Interpretation:  Sinus rhythm ST elev, probable normal early repol pattern No old tracing to compare Confirmed by Melene Plan [phone]) on 06/13/2017 11:33:28 AM       Radiology No results found.  Procedures Procedures (including critical care time)  Medications Ordered in ED Medications - No data to display   Initial Impression / Assessment and Plan / ED Course  I have reviewed the triage vital signs and the nursing notes.  Pertinent labs & imaging results that were available during my care of the patient were reviewed by me and considered in my medical decision making (see chart for details).     Pt presenting with generalized fatigue, intermittent lightheadedness and forgetfulness since Friday. No URI sx, no CP or palpitations, no melena, no head trauma, no facial droop or  slurred speech. No recent tick bites. On exam, pt with no focal neuro deficits, no murmur, lungs CTAB, mucous membranes moist, abdomen benign, no rash.  EKG unremarkable. Normal orthostatics. CMP with slightly elevated LFTs. PLTs are low-normal. Suspect viral illness. Sx unlikely cardiac or intracranial pathology. Discussed pt with Dr. Adela Lank, who recommends option of treating for possible tick-borne illness given elev LFTs and low PLTs. Pt with risk factor of outdoor cat. Had shared decision making with patient who chose to receive the prophylactic antibiotics. Also discussed importance of PCP follow up and pt seems reliable for follow up. Will discharge with Doxycycline and strict return precautions. Pt is safe for discharge.  Patient discussed with Dr. Adela Lank who guided treatment and agrees with care plan.  Discussed results, findings, treatment and follow up. Patient advised of return precautions. Patient verbalized understanding and agreed with plan.   Final Clinical Impressions(s) / ED Diagnoses   Final diagnoses:  Lightheadedness    New Prescriptions Discharge Medication List as of 06/13/2017 12:11 PM    START taking these medications   Details  doxycycline (VIBRAMYCIN) 100 MG capsule Take 1 capsule (100 mg total) by mouth 2 (two) times daily., Starting Mon 06/13/2017, Until Mon 06/20/2017, Print         Russo, [place] N, PA-C 06/13/17 1247    Melene Plan, DO 06/13/17 1340

## 2017-06-13 NOTE — ED Notes (Signed)
ED Provider at bedside. 

## 2017-06-13 NOTE — Discharge Instructions (Signed)
Please schedule an appointment with your primary care provider to follow up on your visit today and recheck your liver enzymes. Through shared decision making, we decided to treat you prophylactically for a tick-borne illness given your symptoms, elevated liver enzymes and low-normal platelet count. Begin taking the antibiotic, Doxycycline, 2 times daily until it is gone. Antibiotic can make your skin sensitive to the son, so it is important to use skin protection or wear protective clothing when taking this medication. Return to the ED if you develop severe headache, vision changes, rash, fever, chest pain, or other new or concerning symptoms.

## 2017-06-13 NOTE — ED Notes (Signed)
Pt states that over the last 3 days he has been feeling more tired than normal reporting desire to nap in the day which he states is not normal for him. He says he went to work today and "was feeling out of it". Recent travel to beach. Pt reports ETOH and marijuana use but denies any more than normal.

## 2017-06-13 NOTE — ED Triage Notes (Signed)
Pt reports generalized fatigue all weekend and states he has had some confusion, pts family member at bedside reports pt is able to carry on conversations and has had no changes in speech but at times seems as though he has trouble recollecting past events. Pt a/ox4, resp e/u, nad. Denies any new meds or changes in previous meds.

## 2018-04-06 ENCOUNTER — Other Ambulatory Visit: Payer: Self-pay | Admitting: Family Medicine

## 2018-04-06 DIAGNOSIS — R748 Abnormal levels of other serum enzymes: Secondary | ICD-10-CM

## 2018-04-11 ENCOUNTER — Ambulatory Visit
Admission: RE | Admit: 2018-04-11 | Discharge: 2018-04-11 | Disposition: A | Discharge status: Home / Self Care | Payer: BC Managed Care – PPO | Source: Ambulatory Visit | Attending: Family Medicine | Admitting: Family Medicine

## 2018-04-11 DIAGNOSIS — R748 Abnormal levels of other serum enzymes: Secondary | ICD-10-CM

## 2018-11-18 IMAGING — CR DG SHOULDER 2+V*L*
2 series · 2 of 2 positions shown · non-contrast
Comparison: LEFT shoulder radiograph November 02, 2016

CLINICAL DATA: Possible dislocation, history of shoulder
dislocations.

EXAM:
LEFT SHOULDER - 2+ VIEW

[shoulder grashey]
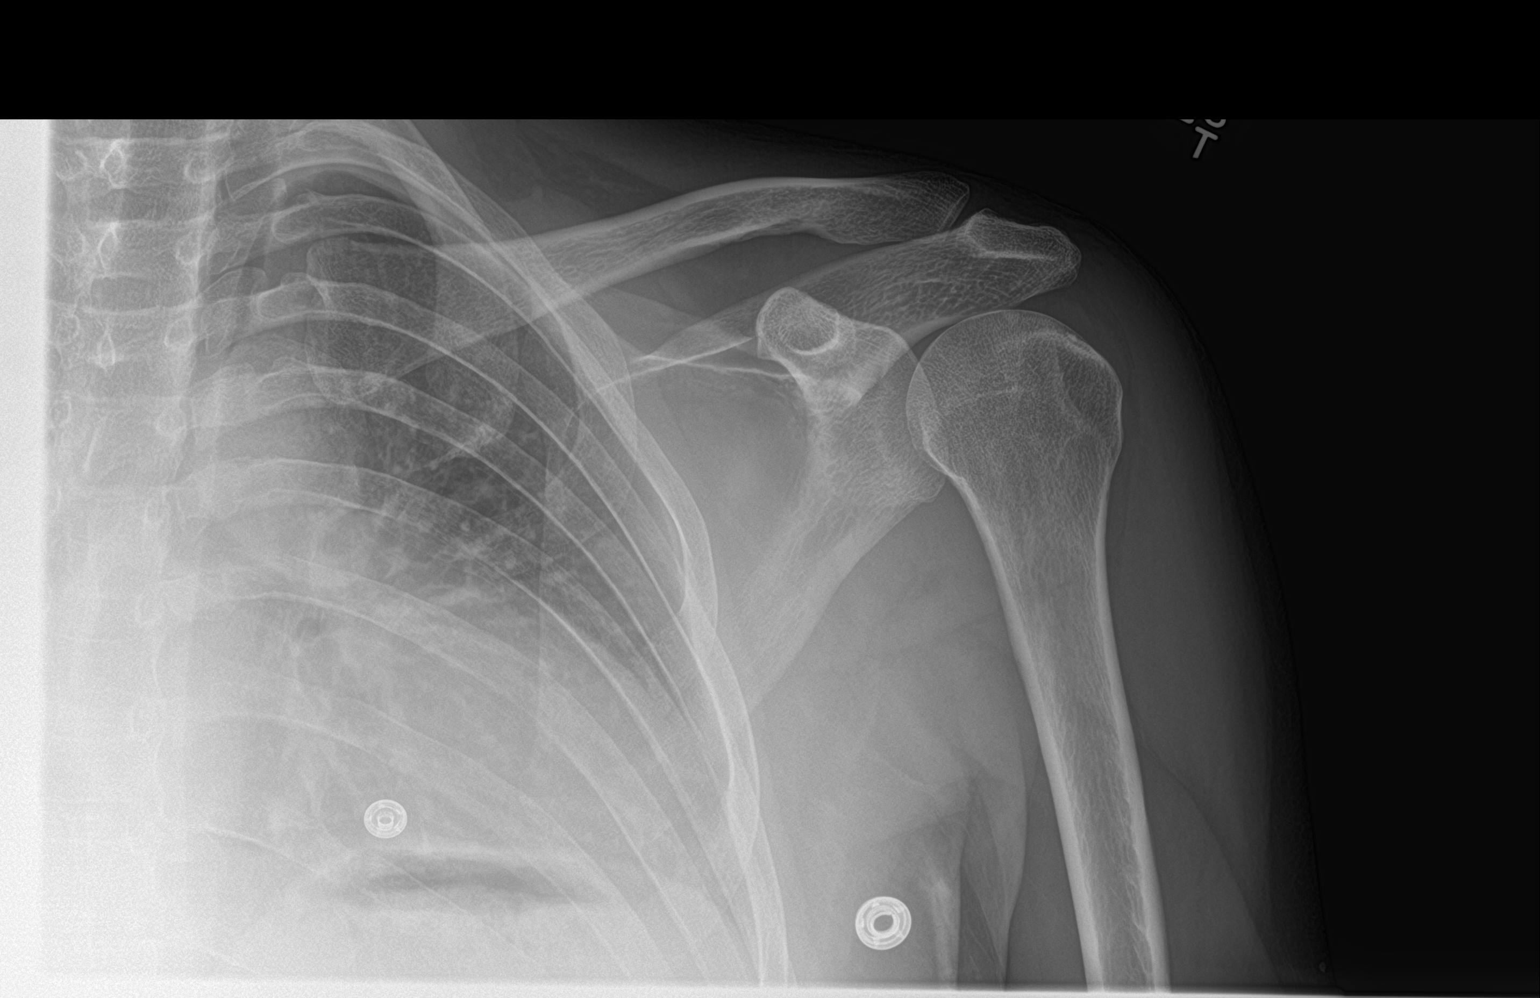

[shoulder y view]
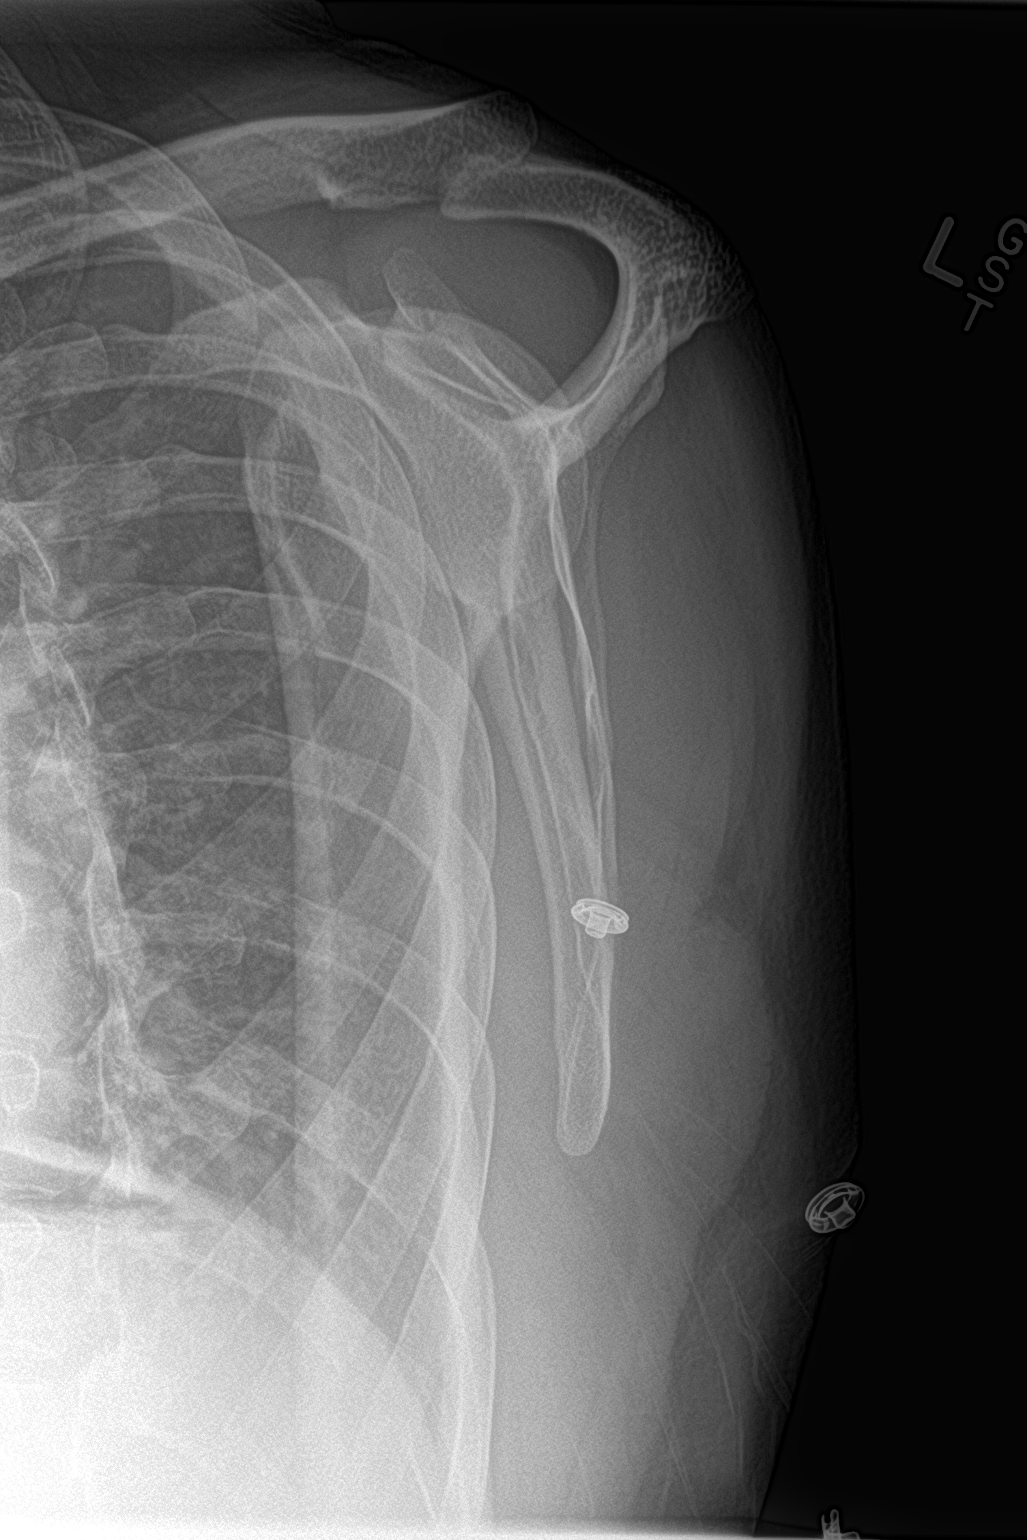

[2 of 2 positions shown; findings below may reference images not displayed]

FINDINGS: The humeral head is well-formed and located. The subacromial,
glenohumeral and acromioclavicular joint spaces are intact. No
destructive bony lesions. Soft tissue planes are non-suspicious.
IMPRESSION: Negative.

## 2019-03-12 ENCOUNTER — Encounter (HOSPITAL_COMMUNITY): Payer: Self-pay | Admitting: Emergency Medicine

## 2019-03-12 ENCOUNTER — Other Ambulatory Visit: Payer: Self-pay

## 2019-03-12 DIAGNOSIS — I10 Essential (primary) hypertension: Secondary | ICD-10-CM | POA: Insufficient documentation

## 2019-03-12 DIAGNOSIS — R42 Dizziness and giddiness: Secondary | ICD-10-CM | POA: Insufficient documentation

## 2019-03-12 DIAGNOSIS — Z79899 Other long term (current) drug therapy: Secondary | ICD-10-CM | POA: Diagnosis not present

## 2019-03-12 DIAGNOSIS — R112 Nausea with vomiting, unspecified: Secondary | ICD-10-CM | POA: Insufficient documentation

## 2019-03-12 LAB — URINALYSIS, ROUTINE W REFLEX MICROSCOPIC
Bilirubin Urine: NEGATIVE
Glucose, UA: NEGATIVE mg/dL
Hgb urine dipstick: NEGATIVE
Ketones, ur: 5 mg/dL — AB
Leukocytes,Ua: NEGATIVE
Nitrite: NEGATIVE
Protein, ur: 100 mg/dL — AB
Specific Gravity, Urine: 1.03 (ref 1.005–1.030)
pH: 7 (ref 5.0–8.0)

## 2019-03-12 LAB — CBC
HCT: 44.9 % (ref 39.0–52.0)
Hemoglobin: 15.5 g/dL (ref 13.0–17.0)
MCH: 31.4 pg (ref 26.0–34.0)
MCHC: 34.5 g/dL (ref 30.0–36.0)
MCV: 91.1 fL (ref 80.0–100.0)
Platelets: 261 10*3/uL (ref 150–400)
RBC: 4.93 MIL/uL (ref 4.22–5.81)
RDW: 13.2 % (ref 11.5–15.5)
WBC: 8.5 10*3/uL (ref 4.0–10.5)
nRBC: 0 % (ref 0.0–0.2)

## 2019-03-12 LAB — COMPREHENSIVE METABOLIC PANEL
ALT: 68 U/L — ABNORMAL HIGH (ref 0–44)
AST: 65 U/L — ABNORMAL HIGH (ref 15–41)
Albumin: 4.6 g/dL (ref 3.5–5.0)
Alkaline Phosphatase: 64 U/L (ref 38–126)
Anion gap: 16 — ABNORMAL HIGH (ref 5–15)
BUN: 22 mg/dL — ABNORMAL HIGH (ref 6–20)
CO2: 24 mmol/L (ref 22–32)
Calcium: 8.6 mg/dL — ABNORMAL LOW (ref 8.9–10.3)
Chloride: 100 mmol/L (ref 98–111)
Creatinine, Ser: 1.15 mg/dL (ref 0.61–1.24)
GFR calc Af Amer: >60 mL/min (ref >60)
GFR calc non Af Amer: >60 mL/min (ref >60)
Glucose, Bld: 100 mg/dL — ABNORMAL HIGH (ref 70–99)
Potassium: 4 mmol/L (ref 3.5–5.1)
Sodium: 140 mmol/L (ref 135–145)
Total Bilirubin: 0.4 mg/dL (ref 0.3–1.2)
Total Protein: 7.5 g/dL (ref 6.5–8.1)

## 2019-03-12 LAB — LIPASE, BLOOD: Lipase: 37 U/L (ref 11–51)

## 2019-03-12 MED ORDER — SODIUM CHLORIDE 0.9% FLUSH
3.0000 mL | Freq: Once | INTRAVENOUS | Status: AC
  Administered 2019-03-13: 3 mL via INTRAVENOUS

## 2019-03-12 NOTE — ED Triage Notes (Addendum)
Pt reports having vomiting and dizziness that started earlier today and has gotten worse.

## 2019-03-13 ENCOUNTER — Emergency Department (HOSPITAL_COMMUNITY)
Admission: EM | Admit: 2019-03-13 | Discharge: 2019-03-13 | Disposition: A | Discharge status: Home / Self Care | Payer: BC Managed Care – PPO | Attending: Emergency Medicine | Admitting: Emergency Medicine

## 2019-03-13 DIAGNOSIS — R42 Dizziness and giddiness: Secondary | ICD-10-CM

## 2019-03-13 DIAGNOSIS — R197 Diarrhea, unspecified: Secondary | ICD-10-CM

## 2019-03-13 DIAGNOSIS — R1110 Vomiting, unspecified: Secondary | ICD-10-CM

## 2019-03-13 MED ORDER — MECLIZINE HCL 12.5 MG PO TABS: 12.5000 mg | ORAL_TABLET | Freq: Three times a day (TID) | ORAL | 0 refills | Status: DC | PRN

## 2019-03-13 MED ORDER — ONDANSETRON HCL 4 MG/2ML IJ SOLN
4.0000 mg | Freq: Once | INTRAMUSCULAR | Status: AC
  Administered 2019-03-13: 4 mg via INTRAVENOUS
  Filled 2019-03-13: qty 2

## 2019-03-13 MED ORDER — SODIUM CHLORIDE 0.9 % IV BOLUS
500.0000 mL | Freq: Once | INTRAVENOUS | Status: AC
  Administered 2019-03-13: 500 mL via INTRAVENOUS

## 2019-03-13 MED ORDER — MECLIZINE HCL 25 MG PO TABS
25.0000 mg | ORAL_TABLET | Freq: Once | ORAL | Status: AC
  Administered 2019-03-13: 25 mg via ORAL
  Filled 2019-03-13: qty 1

## 2019-03-13 MED ORDER — ONDANSETRON 8 MG PO TBDP: ORAL_TABLET | ORAL | 0 refills | Status: DC

## 2019-03-13 NOTE — ED Provider Notes (Signed)
[facility]-EMERGENCY DEPT Provider Note   CSN: [id number] Arrival date & time: 03/12/19  2050     History   Chief Complaint Chief Complaint  Patient presents with  . Dizziness  . Emesis    HPI Juan Clarke is a 38 y.o. male.      Dizziness Quality:  Head spinning Severity:  Severe Onset quality:  Sudden Duration:  1 day Timing:  Intermittent Progression:  Waxing and waning Chronicity:  New Context: head movement   Relieved by:  Nothing Worsened by:  Nothing Ineffective treatments:  None tried Associated symptoms: diarrhea, nausea and vomiting   Associated symptoms: no chest pain, no palpitations and no shortness of breath   Diarrhea:    Quality:  Watery   Severity:  Moderate   Duration:  12 hours   Timing:  Intermittent   Progression:  Unchanged Risk factors: no anemia, no heart disease and no hx of stroke   Emesis Associated symptoms: diarrhea   Child is in daycare.  No f/c/r.  No cough, no sob.  No sore throat.  No anosmia no covid exposure. Is not working at this time.    Past Medical History:  Diagnosis Date  . GAD (generalized anxiety disorder)   . HTN (hypertension)   . Insomnia   . Migraine without aura   . Overweight   . Trigeminal neuralgia     Patient Active Problem List   Diagnosis Date Noted  . Neck pain 02/25/2015  . Headache 02/25/2015  . 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 Occipital neuralgia 07/13/2014  . Generalized anxiety disorder 07/13/2014    Past Surgical History:  Procedure Laterality Date  . Face surgery  11/14/14  . TONSILLECTOMY     38 yr old        Home Medications    Prior to Admission medications   Medication Sig Start Date End Date Taking? Authorizing Provider  carbamazepine (TEGRETOL XR) 200 MG 12 hr tablet Take 2 tablets (400 mg total) by mouth 2 (two) times daily. Patient taking differently: Take 200 mg by mouth 2 (two) times daily.  12/16/16   Anson FretAhern, Antonia B, MD  clonazePAM  (KLONOPIN) 0.5 MG tablet Take 0.5 mg by mouth 2 (two) times daily as needed for anxiety.  06/07/14   [provider]  diphenhydrAMINE (BENADRYL) 25 MG tablet Take 50 mg by mouth every 8 (eight) hours as needed for sleep.    [provider]  ibuprofen (ADVIL,MOTRIN) 200 MG tablet Take 400 mg by mouth every 6 (six) hours as needed for moderate pain.     [provider]  indomethacin (INDOCIN) 50 MG capsule Take 1 capsule (50 mg total) by mouth 2 (two) times daily with a meal. 12/16/16   Anson FretAhern, Antonia B, MD  lisinopril-hydrochlorothiazide (PRINZIDE,ZESTORETIC) 10-12.5 MG per tablet Take 1 tablet by mouth daily.  06/07/14   [provider]    Family History Family History  Problem Relation Age of Onset  . Migraines Mother   . Hypertension Father   . Mental illness Sister   . Drug abuse Sister   . Brain cancer Maternal Grandmother   . Heart disease Paternal Grandfather     Social History Social History   Tobacco Use  . Smoking status: Never Smoker  . Smokeless tobacco: Never Used  Substance Use Topics  . Alcohol use: Yes    Alcohol/week: 0.0 standard drinks    Comment: Occasionally  . Drug use: Yes    Types:  Marijuana     Allergies   Lexapro [escitalopram oxalate] and Zoloft [sertraline hcl]   Review of Systems Review of Systems  Respiratory: Negative for shortness of breath.   Cardiovascular: Negative for chest pain and palpitations.  Gastrointestinal: Positive for diarrhea, nausea and vomiting.  Neurological: Positive for dizziness.  All other systems reviewed and are negative.    Physical Exam Updated Vital Signs BP (!) 137/114 (BP Location: Left Arm)   Pulse 87   Temp 98.4 F (36.9 C) (Oral)   Resp 16   Ht 5\' 10"  (1.778 m)   Wt 99.8 kg   SpO2 96%   BMI 31.57 kg/m   Physical Exam Constitutional:      Appearance: He is obese. He is not ill-appearing.  HENT:     Head: Normocephalic and atraumatic.     Nose: Nose normal.      Mouth/Throat:     Mouth: Mucous membranes are moist.     Pharynx: Oropharynx is clear.  Eyes:     Conjunctiva/sclera: Conjunctivae normal.     Pupils: Pupils are equal, round, and reactive to light.  Neck:     Musculoskeletal: Normal range of motion and neck supple.  Cardiovascular:     Rate and Rhythm: Normal rate and regular rhythm.     Pulses: Normal pulses.     Heart sounds: Normal heart sounds.  Pulmonary:     Effort: Pulmonary effort is normal.     Breath sounds: Normal breath sounds.  Abdominal:     General: Abdomen is flat. Bowel sounds are normal.     Tenderness: There is no abdominal tenderness. There is no guarding or rebound.  Musculoskeletal: Normal range of motion.  Skin:    General: Skin is warm and dry.     Capillary Refill: Capillary refill takes less than 2 seconds.  Neurological:     General: No focal deficit present.     Mental Status: He is alert and oriented to person, place, and time.     Cranial Nerves: No cranial nerve deficit.     Coordination: Coordination normal.     Deep Tendon Reflexes: Reflexes normal.  Psychiatric:        Mood and Affect: Mood normal.        Behavior: Behavior normal.      ED Treatments / Results  Labs (all labs ordered are listed, but only abnormal results are displayed) Labs Reviewed  COMPREHENSIVE METABOLIC PANEL - Abnormal; Notable for the following components:      Result Value   Glucose, Bld 100 (*)    BUN 22 (*)    Calcium 8.6 (*)    AST 65 (*)    ALT 68 (*)    Anion gap 16 (*)    All other components within normal limits  URINALYSIS, ROUTINE W REFLEX MICROSCOPIC - Abnormal; Notable for the following components:   Ketones, ur 5 (*)    Protein, ur 100 (*)    Bacteria, UA RARE (*)    All other components within normal limits  LIPASE, BLOOD  CBC    EKG None  Radiology No results found.  Procedures Procedures (including critical care time)  Medications Ordered in ED Medications  sodium chloride  flush (NS) 0.9 % injection 3 mL (3 mLs Intravenous Given 03/13/19 0311)  sodium chloride 0.9 % bolus 500 mL (500 mLs Intravenous New Bag/Given 03/13/19 0311)  ondansetron (ZOFRAN) injection 4 mg (4 mg Intravenous Given 03/13/19 0310)  meclizine (ANTIVERT) tablet 25  mg (25 mg Oral Given 03/13/19 0310)     PO challenged successfully in the ED.  Stable for discharge.    Final Clinical Impressions(s) / ED Diagnoses   Return for intractable cough, coughing up blood,fevers >100.4 unrelieved by medication, shortness of breath, intractable vomiting, chest pain, shortness of breath, weakness,numbness, changes in speech, facial asymmetry,abdominal pain, passing out,Inability to tolerate liquids or food, cough, altered mental status or any concerns. No signs of systemic illness or infection. The patient is nontoxic-appearing on exam and vital signs are within normal limits.   I have reviewed the triage vital signs and the nursing notes. Pertinent labs &imaging results that were available during my care of the patient were reviewed by me and considered in my medical decision making (see chart for details).  After history, exam, and medical workup I feel the patient has been appropriately medically screened and is safe for discharge home. Pertinent diagnoses were discussed with the patient. Patient was given return precautions      Caellum Mancil, MD 03/13/19 2233

## 2019-03-13 NOTE — ED Notes (Signed)
Patient able to tolerate oral fluids without nausea or vomiting.

## 2019-03-13 NOTE — ED Notes (Addendum)
Made aware by EMT the patient vomited on the floor. Patient provided emesis bag and will administer ordered medications. Will continue to monitor patient.

## 2019-10-27 ENCOUNTER — Ambulatory Visit: Payer: BC Managed Care – PPO | Attending: Internal Medicine

## 2019-10-27 DIAGNOSIS — Z23 Encounter for immunization: Secondary | ICD-10-CM

## 2019-10-27 NOTE — Progress Notes (Signed)
   Covid-19 Vaccination Clinic  Name:  Juan Clarke    MRN: [id number] DOB: 10/08/1980  10/27/2019  [name] was observed post Covid-19 immunization for 15 minutes without incidence. He was provided with Vaccine Information Sheet and instruction to access the V-Safe system.   [name] was instructed to call 911 with any severe reactions post vaccine: Marland Kitchen Difficulty breathing  . Swelling of your face and throat  . A fast heartbeat  . A bad rash all over your body  . Dizziness and weakness    Immunizations Administered    Name Date Dose VIS Date Route   Pfizer COVID-19 Vaccine 10/27/2019 11:34 AM 0.3 mL 08/10/2019 Intramuscular   Manufacturer: [employer], [employer]   Lot: [id number]   NDC: [address]-8

## 2019-11-17 ENCOUNTER — Ambulatory Visit: Payer: BC Managed Care – PPO | Attending: Internal Medicine

## 2019-11-17 DIAGNOSIS — Z23 Encounter for immunization: Secondary | ICD-10-CM

## 2019-11-17 NOTE — Progress Notes (Signed)
   Covid-19 Vaccination Clinic  Name:  [name]    MRN: [id number] DOB: 1980-10-11  11/17/2019  [name] was observed post Covid-19 immunization for 15 minutes without incident. He was provided with Vaccine Information Sheet and instruction to access the V-Safe system.   Mr. Starnes was instructed to call 911 with any severe reactions post vaccine: Marland Kitchen Difficulty breathing  . Swelling of face and throat  . A fast heartbeat  . A bad rash all over body  . Dizziness and weakness   Immunizations Administered    Name Date Dose VIS Date Route   Pfizer COVID-19 Vaccine 11/17/2019 11:53 AM 0.3 mL 08/10/2019 Intramuscular   Manufacturer: [employer], [employer]   Lot: [id number]   NDC: [address]-7

## 2019-11-21 ENCOUNTER — Ambulatory Visit: Payer: Self-pay

## 2021-09-26 ENCOUNTER — Other Ambulatory Visit: Payer: Self-pay

## 2021-09-26 ENCOUNTER — Encounter (HOSPITAL_BASED_OUTPATIENT_CLINIC_OR_DEPARTMENT_OTHER): Payer: Self-pay | Admitting: Emergency Medicine

## 2021-09-26 ENCOUNTER — Emergency Department (HOSPITAL_BASED_OUTPATIENT_CLINIC_OR_DEPARTMENT_OTHER)
Admission: EM | Admit: 2021-09-26 | Discharge: 2021-09-26 | Disposition: A | Discharge status: Home / Self Care | Payer: BC Managed Care – PPO | Attending: Emergency Medicine | Admitting: Emergency Medicine

## 2021-09-26 DIAGNOSIS — H6692 Otitis media, unspecified, left ear: Secondary | ICD-10-CM | POA: Diagnosis not present

## 2021-09-26 DIAGNOSIS — H6690 Otitis media, unspecified, unspecified ear: Secondary | ICD-10-CM

## 2021-09-26 DIAGNOSIS — H9202 Otalgia, left ear: Secondary | ICD-10-CM | POA: Diagnosis present

## 2021-09-26 MED ORDER — AMOXICILLIN 500 MG PO CAPS
500.0000 mg | ORAL_CAPSULE | Freq: Once | ORAL | Status: AC
  Administered 2021-09-26: 500 mg via ORAL
  Filled 2021-09-26: qty 1

## 2021-09-26 MED ORDER — HYDROCODONE-ACETAMINOPHEN 5-325 MG PO TABS: 1.0000 | ORAL_TABLET | Freq: Four times a day (QID) | ORAL | 0 refills | Status: DC | PRN

## 2021-09-26 MED ORDER — AMOXICILLIN 875 MG PO TABS: 875.0000 mg | ORAL_TABLET | Freq: Two times a day (BID) | ORAL | 0 refills | Status: DC

## 2021-09-26 MED ORDER — AMOXICILLIN 875 MG PO TABS: 875.0000 mg | ORAL_TABLET | Freq: Two times a day (BID) | ORAL | 0 refills | Status: AC

## 2021-09-26 NOTE — ED Triage Notes (Addendum)
Sinus congestion and ear congestion  x2 days, woke up 6 hr ago with sharp ear pain and diminished hearing in L ear, not relieved with tylenol and motrin taken at that time. Prone to frequent ear infxns. Denies fever, chills, ear drainage, difficulty swallowing, neck swelling

## 2021-09-26 NOTE — ED Provider Notes (Signed)
[facility] Provider Note   CSN: [id number] Arrival date & time: 09/26/21  [id number]     History  Chief Complaint  Patient presents with   Juan Clarke is a 41 y.o. male.  Pt is a 41 yo male with pmh of recurrent otitis media presenting for ear pain. Pt admits to left sided ear pain that began last night. Denies fevers. No pain relief with motrin or tylenol at home.   The history is provided by the patient. No language interpreter was used.  Otalgia Associated symptoms: no cough, no fever, no sore throat and no vomiting       Home Medications Prior to Admission medications   Medication Sig Start Date End Date Taking? Authorizing Provider  acetaminophen (TYLENOL) 500 MG tablet Take 1,000 mg by mouth every 6 (six) hours as needed for mild pain, moderate pain or headache.    [provider]  atorvastatin (LIPITOR) 10 MG tablet Take 10 mg by mouth daily. 03/08/19   [provider]  carbamazepine (TEGRETOL XR) 200 MG 12 hr tablet Take 2 tablets (400 mg total) by mouth 2 (two) times daily. Patient taking differently: Take 200 mg by mouth 2 (two) times daily as needed (spasm).  12/16/16   Melvenia Beam, MD  clonazePAM (KLONOPIN) 0.5 MG tablet Take 0.5 mg by mouth 2 (two) times daily as needed for anxiety.  06/07/14   [provider]  levothyroxine (SYNTHROID) 50 MCG tablet Take 50 mcg by mouth daily before breakfast. 01/03/19   [provider]  lisinopril-hydrochlorothiazide (ZESTORETIC) 20-25 MG tablet Take 1 tablet by mouth daily. 12/15/18   [provider]  meclizine (ANTIVERT) 12.5 MG tablet Take 1 tablet (12.5 mg total) by mouth 3 (three) times daily as needed for dizziness. 03/13/19   Palumbo, April, MD  ondansetron (ZOFRAN ODT) 8 MG disintegrating tablet 8mg  ODT q8 hours prn nausea 03/13/19   Palumbo, April, MD  PARoxetine (PAXIL) 20 MG tablet Take 20 mg by mouth daily. 02/10/19   [provider]      Allergies    Lexapro [escitalopram oxalate] and Zoloft [sertraline hcl]    Review of Systems   Review of Systems  Constitutional:  Negative for chills and fever.  HENT:  Positive for ear pain. Negative for sore throat.   Respiratory:  Negative for cough and shortness of breath.   Cardiovascular:  Negative for chest pain and palpitations.  Gastrointestinal:  Negative for nausea and vomiting.  All other systems reviewed and are negative.  Physical Exam Updated Vital Signs BP (!) 156/112 (BP Location: Right Arm)    Pulse (!) 117    Temp 98.7 F (37.1 C) (Oral)    Resp 16    Wt 122.5 kg    SpO2 94%    BMI 38.74 kg/m  Physical Exam Vitals and nursing note reviewed.  Constitutional:      General: He is not in acute distress.    Appearance: He is well-developed.  HENT:     Head: Normocephalic and atraumatic.     Right Ear: Tympanic membrane, ear canal and external ear normal.     Left Ear: Tenderness present. No mastoid tenderness. Tympanic membrane is erythematous and bulging.  Eyes:     Conjunctiva/sclera: Conjunctivae normal.  Cardiovascular:     Rate and Rhythm: Normal rate and regular rhythm.     Heart sounds: No murmur heard. Pulmonary:     Effort: Pulmonary effort is  normal. No respiratory distress.     Breath sounds: Normal breath sounds.  Musculoskeletal:     Cervical back: Neck supple.  Neurological:     Mental Status: He is alert.    ED Results / Procedures / Treatments   Labs (all labs ordered are listed, but only abnormal results are displayed) Labs Reviewed - No data to display  EKG None  Radiology No results found.  Procedures Procedures    Medications Ordered in ED Medications - No data to display  ED Course/ Medical Decision Making/ A&P                           Medical Decision Making Risk Prescription drug management.   7:28 AM 41 yo male with pmh of recurrent otitis media presenting for ear pain. Pt has otitis media. Pain  control and amoxicillin given in ED and sent to pharmacy. No mastoid tenderness  Patient in no distress and overall condition improved here in the ED. Detailed discussions were had with the patient regarding current findings, and need for close f/u with ENT for recurrent OM. The patient has been instructed to return immediately if the symptoms worsen in any way for re-evaluation. Patient verbalized understanding and is in agreement with current care plan. All questions answered prior to discharge.         Final Clinical Impression(s) / ED Diagnoses Final diagnoses:  Acute otitis media, unspecified otitis media type    Rx / DC Orders ED Discharge Orders     None         Lianne Cure, DO [id number]

## 2022-08-17 ENCOUNTER — Ambulatory Visit
Admission: RE | Admit: 2022-08-17 | Discharge: 2022-08-17 | Disposition: A | Discharge status: Home / Self Care | Payer: BC Managed Care – PPO | Source: Ambulatory Visit | Attending: Orthopedic Surgery | Admitting: Orthopedic Surgery

## 2022-08-17 ENCOUNTER — Other Ambulatory Visit: Payer: Self-pay | Admitting: Orthopedic Surgery

## 2022-08-17 DIAGNOSIS — M25551 Pain in right hip: Secondary | ICD-10-CM

## 2022-11-28 ENCOUNTER — Emergency Department (HOSPITAL_COMMUNITY): Payer: BC Managed Care – PPO

## 2022-11-28 ENCOUNTER — Other Ambulatory Visit: Payer: Self-pay

## 2022-11-28 ENCOUNTER — Inpatient Hospital Stay (HOSPITAL_COMMUNITY)
Admission: EM | Admit: 2022-11-28 | Discharge: 2022-11-30 | DRG: 896 | Disposition: A | Discharge status: Home / Self Care | Payer: BC Managed Care – PPO | Attending: Family Medicine | Admitting: Family Medicine

## 2022-11-28 ENCOUNTER — Encounter (HOSPITAL_COMMUNITY): Payer: Self-pay

## 2022-11-28 DIAGNOSIS — Z7151 Drug abuse counseling and surveillance of drug abuser: Secondary | ICD-10-CM

## 2022-11-28 DIAGNOSIS — G500 Trigeminal neuralgia: Secondary | ICD-10-CM | POA: Diagnosis present

## 2022-11-28 DIAGNOSIS — Z818 Family history of other mental and behavioral disorders: Secondary | ICD-10-CM | POA: Diagnosis not present

## 2022-11-28 DIAGNOSIS — Z79890 Hormone replacement therapy (postmenopausal): Secondary | ICD-10-CM

## 2022-11-28 DIAGNOSIS — D72819 Decreased white blood cell count, unspecified: Secondary | ICD-10-CM | POA: Diagnosis present

## 2022-11-28 DIAGNOSIS — F10139 Alcohol abuse with withdrawal, unspecified: Secondary | ICD-10-CM | POA: Diagnosis not present

## 2022-11-28 DIAGNOSIS — E669 Obesity, unspecified: Secondary | ICD-10-CM | POA: Diagnosis present

## 2022-11-28 DIAGNOSIS — I10 Essential (primary) hypertension: Secondary | ICD-10-CM | POA: Diagnosis present

## 2022-11-28 DIAGNOSIS — Z888 Allergy status to other drugs, medicaments and biological substances status: Secondary | ICD-10-CM

## 2022-11-28 DIAGNOSIS — F10129 Alcohol abuse with intoxication, unspecified: Secondary | ICD-10-CM | POA: Diagnosis not present

## 2022-11-28 DIAGNOSIS — Z7141 Alcohol abuse counseling and surveillance of alcoholic: Secondary | ICD-10-CM

## 2022-11-28 DIAGNOSIS — G4700 Insomnia, unspecified: Secondary | ICD-10-CM | POA: Diagnosis present

## 2022-11-28 DIAGNOSIS — R410 Disorientation, unspecified: Secondary | ICD-10-CM | POA: Diagnosis present

## 2022-11-28 DIAGNOSIS — Z8249 Family history of ischemic heart disease and other diseases of the circulatory system: Secondary | ICD-10-CM | POA: Diagnosis not present

## 2022-11-28 DIAGNOSIS — Z79899 Other long term (current) drug therapy: Secondary | ICD-10-CM | POA: Diagnosis not present

## 2022-11-28 DIAGNOSIS — Z6837 Body mass index (BMI) 37.0-37.9, adult: Secondary | ICD-10-CM

## 2022-11-28 DIAGNOSIS — Y906 Blood alcohol level of 120-199 mg/100 ml: Secondary | ICD-10-CM | POA: Diagnosis present

## 2022-11-28 DIAGNOSIS — F1010 Alcohol abuse, uncomplicated: Secondary | ICD-10-CM | POA: Diagnosis present

## 2022-11-28 DIAGNOSIS — F12929 Cannabis use, unspecified with intoxication, unspecified: Secondary | ICD-10-CM | POA: Diagnosis present

## 2022-11-28 DIAGNOSIS — F411 Generalized anxiety disorder: Secondary | ICD-10-CM | POA: Diagnosis present

## 2022-11-28 DIAGNOSIS — D696 Thrombocytopenia, unspecified: Secondary | ICD-10-CM | POA: Diagnosis present

## 2022-11-28 DIAGNOSIS — G928 Other toxic encephalopathy: Secondary | ICD-10-CM | POA: Diagnosis present

## 2022-11-28 DIAGNOSIS — K7010 Alcoholic hepatitis without ascites: Secondary | ICD-10-CM | POA: Diagnosis present

## 2022-11-28 LAB — CBC WITH DIFFERENTIAL/PLATELET
Abs Immature Granulocytes: 0.01 10*3/uL (ref 0.00–0.07)
Abs Immature Granulocytes: 0.01 10*3/uL (ref 0.00–0.07)
Basophils Absolute: 0.1 10*3/uL (ref 0.0–0.1)
Basophils Absolute: 0.1 10*3/uL (ref 0.0–0.1)
Basophils Relative: 2 %
Basophils Relative: 2 %
Eosinophils Absolute: 0 10*3/uL (ref 0.0–0.5)
Eosinophils Absolute: 0 10*3/uL (ref 0.0–0.5)
Eosinophils Relative: 1 %
Eosinophils Relative: 1 %
HCT: 38.6 % — ABNORMAL LOW (ref 39.0–52.0)
HCT: 34.8 % — ABNORMAL LOW (ref 39.0–52.0)
Hemoglobin: 13 g/dL (ref 13.0–17.0)
Hemoglobin: 12 g/dL — ABNORMAL LOW (ref 13.0–17.0)
Immature Granulocytes: 0 %
Immature Granulocytes: 0 %
Lymphocytes Relative: 34 %
Lymphocytes Relative: 28 %
Lymphs Abs: 1.1 10*3/uL (ref 0.7–4.0)
Lymphs Abs: 1.1 10*3/uL (ref 0.7–4.0)
MCH: 31.8 pg (ref 26.0–34.0)
MCH: 32.8 pg (ref 26.0–34.0)
MCHC: 33.7 g/dL (ref 30.0–36.0)
MCHC: 34.5 g/dL (ref 30.0–36.0)
MCV: 94.4 fL (ref 80.0–100.0)
MCV: 95.1 fL (ref 80.0–100.0)
Monocytes Absolute: 0.3 10*3/uL (ref 0.1–1.0)
Monocytes Absolute: 0.3 10*3/uL (ref 0.1–1.0)
Monocytes Relative: 9 %
Monocytes Relative: 8 %
Neutro Abs: 1.7 10*3/uL (ref 1.7–7.7)
Neutro Abs: 2.3 10*3/uL (ref 1.7–7.7)
Neutrophils Relative %: 54 %
Neutrophils Relative %: 61 %
Platelets: 154 10*3/uL (ref 150–400)
Platelets: 153 10*3/uL (ref 150–400)
RBC: 4.09 MIL/uL — ABNORMAL LOW (ref 4.22–5.81)
RBC: 3.66 MIL/uL — ABNORMAL LOW (ref 4.22–5.81)
RDW: 14.6 % (ref 11.5–15.5)
RDW: 14.6 % (ref 11.5–15.5)
WBC: 3.2 10*3/uL — ABNORMAL LOW (ref 4.0–10.5)
WBC: 3.8 10*3/uL — ABNORMAL LOW (ref 4.0–10.5)
nRBC: 0 % (ref 0.0–0.2)
nRBC: 0 % (ref 0.0–0.2)

## 2022-11-28 LAB — COMPREHENSIVE METABOLIC PANEL
ALT: 59 U/L — ABNORMAL HIGH (ref 0–44)
ALT: 54 U/L — ABNORMAL HIGH (ref 0–44)
AST: 60 U/L — ABNORMAL HIGH (ref 15–41)
AST: 50 U/L — ABNORMAL HIGH (ref 15–41)
Albumin: 4 g/dL (ref 3.5–5.0)
Albumin: 3.8 g/dL (ref 3.5–5.0)
Alkaline Phosphatase: 81 U/L (ref 38–126)
Alkaline Phosphatase: 72 U/L (ref 38–126)
Anion gap: 13 (ref 5–15)
Anion gap: 13 (ref 5–15)
BUN: 7 mg/dL (ref 6–20)
BUN: 8 mg/dL (ref 6–20)
CO2: 24 mmol/L (ref 22–32)
CO2: 21 mmol/L — ABNORMAL LOW (ref 22–32)
Calcium: 8.9 mg/dL (ref 8.9–10.3)
Calcium: 9.1 mg/dL (ref 8.9–10.3)
Chloride: 100 mmol/L (ref 98–111)
Chloride: 103 mmol/L (ref 98–111)
Creatinine, Ser: 1.03 mg/dL (ref 0.61–1.24)
Creatinine, Ser: 0.97 mg/dL (ref 0.61–1.24)
GFR, Estimated: >60 mL/min (ref >60)
GFR, Estimated: >60 mL/min (ref >60)
Glucose, Bld: 125 mg/dL — ABNORMAL HIGH (ref 70–99)
Glucose, Bld: 110 mg/dL — ABNORMAL HIGH (ref 70–99)
Potassium: 3.5 mmol/L (ref 3.5–5.1)
Potassium: 3.8 mmol/L (ref 3.5–5.1)
Sodium: 137 mmol/L (ref 135–145)
Sodium: 137 mmol/L (ref 135–145)
Total Bilirubin: 0.6 mg/dL (ref 0.3–1.2)
Total Bilirubin: 0.7 mg/dL (ref 0.3–1.2)
Total Protein: 6.6 g/dL (ref 6.5–8.1)
Total Protein: 6.3 g/dL — ABNORMAL LOW (ref 6.5–8.1)

## 2022-11-28 LAB — URINALYSIS, ROUTINE W REFLEX MICROSCOPIC
Bilirubin Urine: NEGATIVE
Glucose, UA: NEGATIVE mg/dL
Hgb urine dipstick: NEGATIVE
Ketones, ur: NEGATIVE mg/dL
Leukocytes,Ua: NEGATIVE
Nitrite: NEGATIVE
Protein, ur: NEGATIVE mg/dL
Specific Gravity, Urine: 1.012 (ref 1.005–1.030)
pH: 9 — ABNORMAL HIGH (ref 5.0–8.0)

## 2022-11-28 LAB — ETHANOL: Alcohol, Ethyl (B): 139 mg/dL — ABNORMAL HIGH (ref <10)

## 2022-11-28 LAB — RAPID URINE DRUG SCREEN, HOSP PERFORMED
Amphetamines: NOT DETECTED
Barbiturates: NOT DETECTED
Benzodiazepines: NOT DETECTED
Cocaine: NOT DETECTED
Opiates: NOT DETECTED
Tetrahydrocannabinol: NOT DETECTED

## 2022-11-28 LAB — MRSA NEXT GEN BY PCR, NASAL: MRSA by PCR Next Gen: NOT DETECTED

## 2022-11-28 LAB — PROTIME-INR
INR: 1.1 (ref 0.8–1.2)
Prothrombin Time: 14.5 seconds (ref 11.4–15.2)

## 2022-11-28 LAB — HIV ANTIBODY (ROUTINE TESTING W REFLEX): HIV Screen 4th Generation wRfx: NONREACTIVE

## 2022-11-28 LAB — AMMONIA: Ammonia: 114 umol/L — ABNORMAL HIGH (ref 9–35)

## 2022-11-28 MED ORDER — FOLIC ACID 1 MG PO TABS
1.0000 mg | ORAL_TABLET | Freq: Every day | ORAL | Status: DC
  Administered 2022-11-28 – 2022-11-30 (×3): 1 mg via ORAL
  Filled 2022-11-28 (×3): qty 1

## 2022-11-28 MED ORDER — CHLORDIAZEPOXIDE HCL 25 MG PO CAPS: 25.0000 mg | ORAL_CAPSULE | ORAL | Status: DC

## 2022-11-28 MED ORDER — LORAZEPAM 2 MG/ML IJ SOLN
1.0000 mg | INTRAMUSCULAR | Status: DC | PRN
  Administered 2022-11-28: 1 mg via INTRAVENOUS
  Filled 2022-11-28: qty 1

## 2022-11-28 MED ORDER — LORAZEPAM 1 MG PO TABS: 0.0000 mg | ORAL_TABLET | Freq: Two times a day (BID) | ORAL | Status: DC

## 2022-11-28 MED ORDER — LOPERAMIDE HCL 2 MG PO CAPS: 2.0000 mg | ORAL_CAPSULE | ORAL | Status: DC | PRN

## 2022-11-28 MED ORDER — CHLORDIAZEPOXIDE HCL 5 MG PO CAPS
25.0000 mg | ORAL_CAPSULE | Freq: Four times a day (QID) | ORAL | Status: AC
  Administered 2022-11-28 – 2022-11-29 (×4): 25 mg via ORAL
  Filled 2022-11-28 (×4): qty 5

## 2022-11-28 MED ORDER — ONDANSETRON HCL 4 MG/2ML IJ SOLN: 4.0000 mg | Freq: Four times a day (QID) | INTRAMUSCULAR | Status: DC | PRN

## 2022-11-28 MED ORDER — LACTATED RINGERS IV BOLUS
1000.0000 mL | Freq: Once | INTRAVENOUS | Status: AC
  Administered 2022-11-28: 1000 mL via INTRAVENOUS

## 2022-11-28 MED ORDER — LORAZEPAM 2 MG/ML IJ SOLN
1.0000 mg | INTRAMUSCULAR | Status: DC | PRN
  Filled 2022-11-28 (×2): qty 1

## 2022-11-28 MED ORDER — LACTULOSE 10 GM/15ML PO SOLN
30.0000 g | Freq: Three times a day (TID) | ORAL | Status: DC
  Administered 2022-11-28: 30 g via ORAL
  Filled 2022-11-28: qty 45

## 2022-11-28 MED ORDER — THIAMINE HCL 100 MG/ML IJ SOLN: 100.0000 mg | Freq: Every day | INTRAMUSCULAR | Status: DC

## 2022-11-28 MED ORDER — ONDANSETRON HCL 4 MG PO TABS: 4.0000 mg | ORAL_TABLET | Freq: Four times a day (QID) | ORAL | Status: DC | PRN

## 2022-11-28 MED ORDER — LORAZEPAM 1 MG PO TABS
0.0000 mg | ORAL_TABLET | Freq: Four times a day (QID) | ORAL | Status: DC
  Administered 2022-11-28: 3 mg via ORAL
  Filled 2022-11-28: qty 3

## 2022-11-28 MED ORDER — CHLORDIAZEPOXIDE HCL 25 MG PO CAPS
25.0000 mg | ORAL_CAPSULE | Freq: Three times a day (TID) | ORAL | Status: AC
  Administered 2022-11-29 – 2022-11-30 (×3): 25 mg via ORAL
  Filled 2022-11-28 (×3): qty 1

## 2022-11-28 MED ORDER — HEPARIN SODIUM (PORCINE) 5000 UNIT/ML IJ SOLN
5000.0000 [IU] | Freq: Three times a day (TID) | INTRAMUSCULAR | Status: DC
  Administered 2022-11-28: 5000 [IU] via SUBCUTANEOUS
  Filled 2022-11-28: qty 1

## 2022-11-28 MED ORDER — HYDROXYZINE HCL 25 MG PO TABS: 25.0000 mg | ORAL_TABLET | Freq: Four times a day (QID) | ORAL | Status: DC | PRN

## 2022-11-28 MED ORDER — ADULT MULTIVITAMIN W/MINERALS CH
1.0000 | ORAL_TABLET | Freq: Every day | ORAL | Status: DC
  Administered 2022-11-28 – 2022-11-30 (×3): 1 via ORAL
  Filled 2022-11-28 (×3): qty 1

## 2022-11-28 MED ORDER — PAROXETINE HCL 20 MG PO TABS
20.0000 mg | ORAL_TABLET | Freq: Every day | ORAL | Status: DC
  Administered 2022-11-28 – 2022-11-30 (×3): 20 mg via ORAL
  Filled 2022-11-28 (×3): qty 1

## 2022-11-28 MED ORDER — LACTATED RINGERS IV SOLN: INTRAVENOUS | Status: DC

## 2022-11-28 MED ORDER — LEVOTHYROXINE SODIUM 50 MCG PO TABS
50.0000 ug | ORAL_TABLET | Freq: Every day | ORAL | Status: DC
  Administered 2022-11-29 – 2022-11-30 (×2): 50 ug via ORAL
  Filled 2022-11-28 (×2): qty 1

## 2022-11-28 MED ORDER — THIAMINE MONONITRATE 100 MG PO TABS
100.0000 mg | ORAL_TABLET | Freq: Every day | ORAL | Status: DC
  Administered 2022-11-28 – 2022-11-30 (×3): 100 mg via ORAL
  Filled 2022-11-28 (×3): qty 1

## 2022-11-28 MED ORDER — CLONIDINE HCL ER 0.1 MG PO TB12: 0.1000 mg | ORAL_TABLET | Freq: Four times a day (QID) | ORAL | Status: DC | PRN

## 2022-11-28 MED ORDER — ACETAMINOPHEN 650 MG RE SUPP: 650.0000 mg | Freq: Four times a day (QID) | RECTAL | Status: DC | PRN

## 2022-11-28 MED ORDER — CHLORDIAZEPOXIDE HCL 25 MG PO CAPS: 25.0000 mg | ORAL_CAPSULE | Freq: Every day | ORAL | Status: DC

## 2022-11-28 MED ORDER — LISINOPRIL 20 MG PO TABS
20.0000 mg | ORAL_TABLET | Freq: Every day | ORAL | Status: DC
  Administered 2022-11-28 – 2022-11-30 (×3): 20 mg via ORAL
  Filled 2022-11-28 (×3): qty 1

## 2022-11-28 MED ORDER — CLONIDINE HCL 0.1 MG PO TABS
0.1000 mg | ORAL_TABLET | Freq: Two times a day (BID) | ORAL | Status: DC
  Administered 2022-11-28 – 2022-11-30 (×5): 0.1 mg via ORAL
  Filled 2022-11-28 (×5): qty 1

## 2022-11-28 MED ORDER — ENOXAPARIN SODIUM 40 MG/0.4ML IJ SOSY
40.0000 mg | PREFILLED_SYRINGE | INTRAMUSCULAR | Status: DC
  Administered 2022-11-28 – 2022-11-29 (×2): 40 mg via SUBCUTANEOUS
  Filled 2022-11-28 (×2): qty 0.4

## 2022-11-28 MED ORDER — LORAZEPAM 1 MG PO TABS
1.0000 mg | ORAL_TABLET | ORAL | Status: DC | PRN
  Administered 2022-11-28: 2 mg via ORAL
  Administered 2022-11-28 (×2): 3 mg via ORAL
  Administered 2022-11-28: 2 mg via ORAL
  Administered 2022-11-28 (×2): 1 mg via ORAL
  Filled 2022-11-28: qty 2
  Filled 2022-11-28 (×2): qty 3
  Filled 2022-11-28: qty 1
  Filled 2022-11-28: qty 2
  Filled 2022-11-28: qty 1

## 2022-11-28 MED ORDER — ACETAMINOPHEN 325 MG PO TABS: 650.0000 mg | ORAL_TABLET | Freq: Four times a day (QID) | ORAL | Status: DC | PRN

## 2022-11-28 NOTE — ED Notes (Signed)
Patient has returned to room from CT  

## 2022-11-28 NOTE — Progress Notes (Signed)
[facility] PROGRESS NOTE  Juan Clarke (DOB: 22-Jun-1981) [id number] PCP: Kathyrn Lass, MD  Brief Narrative: Juan Clarke is a 42 y.o. male with a history of substance abuse undergoing counseling, GAD on clonazepam, HTN who presented to the ED due to altered mental status on 11/28/2022 after a binge of alcohol, clonazepam, marijuana. He was admitted for alcohol withdrawal.  Subjective: Has returned to mental baseline but very anxious. Goal is complete alcohol and substance abstinence.   Objective: BP 95/79   Pulse 96   Temp 97.9 F (36.6 C) (Oral)   Resp 20   Ht 5\' 10"  (1.778 m)   Wt 117.8 kg   SpO2 98%   BMI 37.26 kg/m   Gen: Anxious, tremulous, obese male in no distress Pulm: Clear, nonlabored  CV: Regular tachycardia without MRG or pitting edema GI: Soft, NT, ND, +BS Neuro: Alert and oriented. No new focal deficits. Ext: Warm, no deformities.  Skin: Diaphoretic without acute rashes, lesions or ulcers on visualized skin   Assessment & Plan: Alcohol, benzodiazepine, marijuana intoxication: EtOH level elevated at admission. This represents a binge/relapse. No harmful intent.  Alcohol abuse and withdrawal:  - Start librium taper. His CIWA scores so shortly into cessation suggest his alcohol/BZD use is higher than reported.  - Augment ativan prn per CIWA. Continue PCU level of care - Vitamins, minerals - Start clonidine - TOC consulted  GAD:  - Will resume home clonazepam once no longer requiring librium and ativan.   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 He does not appear to have hepatic encephalopathy, no hx of same, will DC lactulose and monitor for now.   Migraine: Quiescent  Leukopenia: Suspect alcohol-related. No fever. Monitoring.  Patrecia Pour, MD [facility] [email or web address] 11/28/2022, 12:38 PM

## 2022-11-28 NOTE — ED Notes (Signed)
Patient transported to CT 

## 2022-11-28 NOTE — ED Notes (Signed)
ED TO INPATIENT HANDOFF REPORT  ED Nurse Name and Phone #: Dario Guardian Orvis Stann, RN [phone]  S Name/Age/Gender Juan Clarke 42 y.o. male Room/Bed: 034C/034C  Code Status   Code Status: Full Code  Home/SNF/Other Home Patient oriented to: self and place Is this baseline? No   Triage Complete: Triage complete  Chief Complaint ETOH abuse [F10.10]  Triage Note Patient bib [facility] from home after family called because he was altered. [facility] reports he is A&Ox2- Person and place He also reports that he smoked marijuana all day, drank all day, and took an unknown amount of klonopin today Patient denies wanting to hurt himself or others. [facility] reports that he stated he smokes 6 blunts a day and 6 beers. Hx  of HTN    Allergies Allergies  Allergen Reactions   Lexapro [Escitalopram Oxalate] Other (See Comments)    Lack of therapeutic effect    Zoloft [Sertraline Hcl] Other (See Comments)    Lack of therapeutic effect     Level of Care/Admitting Diagnosis ED Disposition     ED Disposition  Admit   Condition  --   Comment  Hospital Area: [facility] [[id number]]  Level of Care: Progressive [102]  Admit to Progressive based on following criteria: Other see comments  Comments: etoh w/d  May admit patient to [facility] or [facility] if equivalent level of care is available:: No  Covid Evaluation: Symptomatic Person Under Investigation (PUI) or recent exposure (last 10 days) *Testing Required*  Diagnosis: ETOH abuse [id number]  Admitting Physician: Clance Boll [id number]  Attending Physician: Clance Boll [id number]  Certification:: I certify this patient will need inpatient services for at least 2 midnights  Estimated Length of Stay: 3          B Medical/Surgery History Past Medical History:  Diagnosis Date   GAD (generalized anxiety disorder)    HTN (hypertension)    Insomnia    Migraine without aura    Overweight    Trigeminal neuralgia     Past Surgical History:  Procedure Laterality Date   Face surgery  11/14/14   TONSILLECTOMY     42 yr old     A IV Location/Drains/Wounds Patient Lines/Drains/Airways Status     Active Line/Drains/Airways     Name Placement date Placement time Site Days   Peripheral IV 11/28/22 20 G 1" Anterior;Distal;Right Forearm 11/28/22  0315  Forearm  less than 1            Intake/Output Last 24 hours No intake or output data in the 24 hours ending 11/28/22 0439  Labs/Imaging Results for orders placed or performed during the hospital encounter of 11/28/22 (from the past 48 hour(s))  CBC with Differential/Platelet     Status: Abnormal   Collection Time: 11/28/22 12:46 AM  Result Value Ref Range   WBC 3.2 (L) 4.0 - 10.5 K/uL   RBC 4.09 (L) 4.22 - 5.81 MIL/uL   Hemoglobin 13.0 13.0 - 17.0 g/dL   HCT 38.6 (L) 39.0 - 52.0 %   MCV 94.4 80.0 - 100.0 fL   MCH 31.8 26.0 - 34.0 pg   MCHC 33.7 30.0 - 36.0 g/dL   RDW 14.6 11.5 - 15.5 %   Platelets 154 150 - 400 K/uL   nRBC 0.0 0.0 - 0.2 %   Neutrophils Relative % 54 %   Neutro Abs 1.7 1.7 - 7.7 K/uL   Lymphocytes Relative 34 %   Lymphs Abs 1.1 0.7 -  4.0 K/uL   Monocytes Relative 9 %   Monocytes Absolute 0.3 0.1 - 1.0 K/uL   Eosinophils Relative 1 %   Eosinophils Absolute 0.0 0.0 - 0.5 K/uL   Basophils Relative 2 %   Basophils Absolute 0.1 0.0 - 0.1 K/uL   Immature Granulocytes 0 %   Abs Immature Granulocytes 0.01 0.00 - 0.07 K/uL    Comment: Performed at [facility] [address], [place], [place] [address]  Comprehensive metabolic panel     Status: Abnormal   Collection Time: 11/28/22 12:46 AM  Result Value Ref Range   Sodium 137 135 - 145 mmol/L   Potassium 3.5 3.5 - 5.1 mmol/L   Chloride 100 98 - 111 mmol/L   CO2 24 22 - 32 mmol/L   Glucose, Bld 125 (H) 70 - 99 mg/dL    Comment: Glucose reference range applies only to samples taken after fasting for at least 8 hours.   BUN 7 6 - 20 mg/dL   Creatinine, Ser 1.03 0.61  - 1.24 mg/dL   Calcium 8.9 8.9 - 10.3 mg/dL   Total Protein 6.6 6.5 - 8.1 g/dL   Albumin 4.0 3.5 - 5.0 g/dL   AST 60 (H) 15 - 41 U/L   ALT 59 (H) 0 - 44 U/L   Alkaline Phosphatase 81 38 - 126 U/L   Total Bilirubin 0.6 0.3 - 1.2 mg/dL   GFR, Estimated >60 >60 mL/min    Comment: (NOTE) Calculated using the CKD-EPI Creatinine Equation (2021)    Anion gap 13 5 - 15    Comment: Performed at [facility] [address], [place], [place] [address]  Protime-INR     Status: None   Collection Time: 11/28/22 12:46 AM  Result Value Ref Range   Prothrombin Time 14.5 11.4 - 15.2 seconds   INR 1.1 0.8 - 1.2    Comment: (NOTE) INR goal varies based on device and disease states. Performed at [facility], [place] [address] [place], [place] [address]   Ethanol     Status: Abnormal   Collection Time: 11/28/22 12:46 AM  Result Value Ref Range   Alcohol, Ethyl (B) 139 (H) <10 mg/dL    Comment: (NOTE) Lowest detectable limit for serum alcohol is 10 mg/dL.  For medical purposes only. Performed at [facility], [place] [address], [place], [place] [address]   Ammonia     Status: Abnormal   Collection Time: 11/28/22  2:15 AM  Result Value Ref Range   Ammonia 114 (H) 9 - 35 umol/L    Comment: Performed at [facility], [place] [address] [place], [place] [address]   CT HEAD WO CONTRAST (5MM)  Result Date: 11/28/2022 CLINICAL DATA:  Mental status change, unknown cause. EXAM: CT HEAD WITHOUT CONTRAST TECHNIQUE: Contiguous axial images were obtained from the base of the skull through the vertex without intravenous contrast. RADIATION DOSE REDUCTION: This exam was performed according to the departmental dose-optimization program which includes automated exposure control, adjustment of the mA and/or kV according to patient size and/or use of iterative reconstruction technique. COMPARISON:  11/14/2014. FINDINGS: Brain: No acute intracranial hemorrhage, midline shift or mass effect. No  extra-axial fluid collection. Gray-white matter differentiation is within normal limits. No hydrocephalus. Vascular: No hyperdense vessel or unexpected calcification. Skull: Normal. Negative for fracture or focal lesion. Sinuses/Orbits: No acute finding. Other: None. IMPRESSION: No acute intracranial process. Electronically Signed   By: Regan Rakers.D.  On: 11/28/2022 01:30    Pending Labs Unresulted Labs (From admission, onward)     Start     Ordered   11/28/22 0500  Comprehensive metabolic panel  Tomorrow morning,   R        11/28/22 0403   11/28/22 0500  CBC  Tomorrow morning,   R        11/28/22 0403   11/28/22 0403  CBC with Differential/Platelet  Once,   R        11/28/22 0403   11/28/22 0358  CBC  (heparin)  Once,   R       Comments: Baseline for heparin therapy IF NOT ALREADY DRAWN.  Notify MD if PLT < 100 K.    11/28/22 0403   11/28/22 0357  HIV Antibody (routine testing w rflx)  (HIV Antibody (Routine testing w reflex) panel)  Once,   R        11/28/22 0403   11/28/22 0036  Rapid urine drug screen (hospital performed)  ONCE - STAT,   STAT        11/28/22 0035   11/28/22 0036  Urinalysis, Routine w reflex microscopic -Urine, Clean Catch  ONCE - URGENT,   URGENT       Question:  Specimen Source  Answer:  Urine, Clean Catch   11/28/22 0035            Vitals/Pain Today's Vitals   11/28/22 0100 11/28/22 0130 11/28/22 0254 11/28/22 0318  BP: (!) 142/92 (!) 143/113    Pulse: (!) 109 (!) 107 (!) 104 100  Resp: 17 18  18   Temp:      TempSrc:      SpO2: 95% 98%  99%  Weight:      Height:      PainSc:        Isolation Precautions No active isolations  Medications Medications  thiamine (VITAMIN B1) tablet 100 mg (has no administration in time range)    Or  thiamine (VITAMIN B1) injection 100 mg (has no administration in time range)  folic acid (FOLVITE) tablet 1 mg (has no administration in time range)  multivitamin with minerals tablet 1 tablet (has no  administration in time range)  LORazepam (ATIVAN) tablet 0-4 mg (3 mg Oral Given 11/28/22 0259)    Followed by  LORazepam (ATIVAN) tablet 0-4 mg (has no administration in time range)  heparin injection 5,000 Units (has no administration in time range)  lactated ringers infusion (has no administration in time range)  acetaminophen (TYLENOL) tablet 650 mg (has no administration in time range)    Or  acetaminophen (TYLENOL) suppository 650 mg (has no administration in time range)  ondansetron (ZOFRAN) tablet 4 mg (has no administration in time range)    Or  ondansetron (ZOFRAN) injection 4 mg (has no administration in time range)  lactated ringers bolus 1,000 mL (1,000 mLs Intravenous New Bag/Given 11/28/22 0315)    Mobility In current medical state, pt will need assistance to ambulate, high fall risk     Focused Assessments CIWA, last score was 16, 3mg  oral ativan given    R Recommendations: See Admitting Provider Note  Report given to:   Additional Notes:  Call or epic message for any additional questions

## 2022-11-28 NOTE — H&P (Addendum)
History and Physical    Juan Clarke [id number] DOB: 03/19/1981 DOA: 11/28/2022  PCP: Kathyrn Lass, MD  Patient coming from: home  I have personally briefly reviewed patient's old medical records in [place]  Chief Complaint: confusion /agitation   HPI: Juan Clarke is a 42 y.o. male with medical history significant of  GAD, ETOH abuse, marijauna use, migraine, essential hypertension who presents to ED BIB EMS due to family noting patient was not acting like himself and was noted to be confused and intermittently agitated. Family also concerned that patient took more that usual dose of klonopin.  IN the field per notes patient was noted to be A+Ox2. Patient currently in ED is somewhat confused unable to give history . States he does not recall any events from today. He currently notes no pain / sob/ n/v/d/dysuria/ cough/fever or chills.   ED Course:  Afeb, bp 149/101, hr 122,  rr 18 , sat 98%  [id number] tachycardia  q in inferior leads Labs: wbc 3.2, hgb 13, plt 154,  Na 137, K3.5,  glu 125, ast 60, alt 59  Inr 1.1 EtOH 139 CTH: NAD NH 114 TX Ativan 3mg  oral 1LR  Review of Systems: As per HPI otherwise 10 point review of systems negative.   Past Medical History:  Diagnosis Date   GAD (generalized anxiety disorder)    HTN (hypertension)    Insomnia    Migraine without aura    Overweight    Trigeminal neuralgia     Past Surgical History:  Procedure Laterality Date   Face surgery  11/14/14   TONSILLECTOMY     42 yr old     reports that he has never smoked. He has never used smokeless tobacco. He reports current alcohol use of about 42.0 standard drinks of alcohol per week. He reports current drug use. Frequency: 7.00 times per week. Drug: Marijuana.  Allergies  Allergen Reactions   Lexapro [Escitalopram Oxalate] Other (See Comments)    Lack of therapeutic effect    Zoloft [Sertraline Hcl] Other (See Comments)    Lack of therapeutic effect      Family History  Problem Relation Age of Onset   Migraines Mother    Hypertension Father    Mental illness Sister    Drug abuse Sister    Brain cancer Maternal Grandmother    Heart disease Paternal Grandfather     Prior to Admission medications   Medication Sig Start Date End Date Taking? Authorizing Provider  atorvastatin (LIPITOR) 10 MG tablet Take 10 mg by mouth daily. 03/08/19   [provider]  carbamazepine (TEGRETOL XR) 200 MG 12 hr tablet Take 2 tablets (400 mg total) by mouth 2 (two) times daily. Patient taking differently: Take 200 mg by mouth 2 (two) times daily as needed (spasm).  12/16/16   Melvenia Beam, MD  clonazePAM (KLONOPIN) 0.5 MG tablet Take 0.5 mg by mouth 2 (two) times daily as needed for anxiety.  06/07/14   [provider]  HYDROcodone-acetaminophen (NORCO) 5-325 MG tablet Take 1 tablet by mouth every 6 (six) hours as needed for moderate pain. [id number]   Lianne Cure, DO  levothyroxine (SYNTHROID) 50 MCG tablet Take 50 mcg by mouth daily before breakfast. 01/03/19   [provider]  lisinopril-hydrochlorothiazide (ZESTORETIC) 20-25 MG tablet Take 1 tablet by mouth daily. 12/15/18   [provider]  meclizine (ANTIVERT) 12.5 MG tablet Take 1 tablet (12.5 mg total) by mouth 3 (three) times daily  as needed for dizziness. 03/13/19   Palumbo, April, MD  ondansetron (ZOFRAN ODT) 8 MG disintegrating tablet 8mg  ODT q8 hours prn nausea 03/13/19   Palumbo, April, MD  PARoxetine (PAXIL) 20 MG tablet Take 20 mg by mouth daily. 02/10/19   [provider]    Physical Exam: Vitals:   11/28/22 0100 11/28/22 0130 11/28/22 0254 11/28/22 0318  BP: (!) 142/92 (!) 143/113    Pulse: (!) 109 (!) 107 (!) 104 100  Resp: 17 18  18   Temp:      TempSrc:      SpO2: 95% 98%  99%  Weight:      Height:        Constitutional: NAD, calm, comfortable Vitals:   11/28/22 0100 11/28/22 0130 11/28/22 0254 11/28/22 0318  BP: (!) 142/92 (!) 143/113     Pulse: (!) 109 (!) 107 (!) 104 100  Resp: 17 18  18   Temp:      TempSrc:      SpO2: 95% 98%  99%  Weight:      Height:       Eyes: PERRL, lids and conjunctivae normal ENMT: Mucous membranes are moist. Posterior pharynx clear of any exudate or lesions.Normal dentition.  Neck: normal, supple, no masses, no thyromegaly Respiratory: clear to auscultation bilaterally, no wheezing, no crackles. Normal respiratory effort. No accessory muscle use.  Cardiovascular: Regular rate and rhythm, no murmurs / rubs / gallops. No extremity edema. 2+ pedal pulses. No carotid bruits.  Abdomen: no tenderness, no masses palpated. No hepatosplenomegaly. Bowel sounds positive.  Musculoskeletal: no clubbing / cyanosis. No joint deformity upper and lower extremities. Good ROM, no contractures. Normal muscle tone.  Skin: no rashes, lesions, ulcers. No induration Neurologic: CN 2-12 grossly intact. Sensation intact,. Strength 5/5 in all 4. + mild tremors, + tongue fasciculations  confused /dilerius Psychiatric: . Alert and oriented x 3. Normal mood.    Labs on Admission: I have personally reviewed following labs and imaging studies  CBC: Recent Labs  Lab 11/28/22 0046  WBC 3.2*  NEUTROABS 1.7  HGB 13.0  HCT 38.6*  MCV 94.4  PLT 123456   Basic Metabolic Panel: Recent Labs  Lab 11/28/22 0046  NA 137  K 3.5  CL 100  CO2 24  GLUCOSE 125*  BUN 7  CREATININE 1.03  CALCIUM 8.9   GFR: Estimated Creatinine Clearance: 121.5 mL/min (by C-G formula based on SCr of 1.03 mg/dL). Liver Function Tests: Recent Labs  Lab 11/28/22 0046  AST 60*  ALT 59*  ALKPHOS 81  BILITOT 0.6  PROT 6.6  ALBUMIN 4.0   No results for input(s): "LIPASE", "AMYLASE" in the last 168 hours. Recent Labs  Lab 11/28/22 0215  AMMONIA 114*   Coagulation Profile: Recent Labs  Lab 11/28/22 0046  INR 1.1   Cardiac Enzymes: No results for input(s): "CKTOTAL", "CKMB", "CKMBINDEX", "TROPONINI" in the last 168 hours. BNP  (last 3 results) No results for input(s): "PROBNP" in the last 8760 hours. HbA1C: No results for input(s): "HGBA1C" in the last 72 hours. CBG: No results for input(s): "GLUCAP" in the last 168 hours. Lipid Profile: No results for input(s): "CHOL", "HDL", "LDLCALC", "TRIG", "CHOLHDL", "LDLDIRECT" in the last 72 hours. Thyroid Function Tests: No results for input(s): "TSH", "T4TOTAL", "FREET4", "T3FREE", "THYROIDAB" in the last 72 hours. Anemia Panel: No results for input(s): "VITAMINB12", "FOLATE", "FERRITIN", "TIBC", "IRON", "RETICCTPCT" in the last 72 hours. Urine analysis:    Component Value Date/Time   COLORURINE YELLOW 03/12/2019 2152  APPEARANCEUR CLEAR 03/12/2019 2152   LABSPEC 1.030 03/12/2019 2152   PHURINE 7.0 03/12/2019 2152   GLUCOSEU NEGATIVE 03/12/2019 2152   HGBUR NEGATIVE 03/12/2019 2152   BILIRUBINUR NEGATIVE 03/12/2019 2152   KETONESUR 5 (A) 03/12/2019 2152   PROTEINUR 100 (A) 03/12/2019 2152   NITRITE NEGATIVE 03/12/2019 2152   LEUKOCYTESUR NEGATIVE 03/12/2019 2152    Radiological Exams on Admission: CT HEAD WO CONTRAST (5MM)  Result Date: 11/28/2022 CLINICAL DATA:  Mental status change, unknown cause. EXAM: CT HEAD WITHOUT CONTRAST TECHNIQUE: Contiguous axial images were obtained from the base of the skull through the vertex without intravenous contrast. RADIATION DOSE REDUCTION: This exam was performed according to the departmental dose-optimization program which includes automated exposure control, adjustment of the mA and/or kV according to patient size and/or use of iterative reconstruction technique. COMPARISON:  11/14/2014. FINDINGS: Brain: No acute intracranial hemorrhage, midline shift or mass effect. No extra-axial fluid collection. Gray-white matter differentiation is within normal limits. No hydrocephalus. Vascular: No hyperdense vessel or unexpected calcification. Skull: Normal. Negative for fracture or focal lesion. Sinuses/Orbits: No acute finding.  Other: None. IMPRESSION: No acute intracranial process. Electronically Signed   By: Brett Fairy M.D.   On: 11/28/2022 01:30    EKG: Independently reviewed. See above   Assessment/Plan    Acute ETOH withdrawal -admit to progressive care  - place on ciwa protocol  - MVI -ivfs   Hepatic Encephalopathy -start lactulose  -monitor ammonia , admit level 114  -no prior hx of hepatic encephalopathy   GAD  -on klonopin at home  -hold currently while on ciwa   Polysubstance ( Etoh, marijuana )  -Case work [facility]  consult   Migraine  -no active issues   Essential HTN ,uncontrolled - uncontrolled due to w/d  - will place on clonidine q6h  prn   Mild ETOH transaminitis  -continue to monitor ast/alt  Leukopenia nos  -monitor counts  -possibly related to etoh       DVT prophylaxis: heparin Code Status: full/ as discussed per patient wishes in event of cardiac arrest  Family Communication:  Disposition Plan: patient  expected to be admitted greater than 2 midnights  Consults called: n/a Admission status: progressive   Clance Boll MD [facility]   If 7PM-7AM, please contact night-coverage [email or web address] Password [email or web address]  11/28/2022, 3:30 AM

## 2022-11-28 NOTE — ED Triage Notes (Signed)
Patient bib [facility] from home after family called because he was altered. [facility] reports he is A&Ox2- Person and place He also reports that he smoked marijuana all day, drank all day, and took an unknown amount of klonopin today Patient denies wanting to hurt himself or others. [facility] reports that he stated he smokes 6 blunts a day and 6 beers. Hx  of HTN

## 2022-11-28 NOTE — ED Provider Notes (Signed)
[facility] Provider Note   CSN: [id number] Arrival date & time: 11/28/22  0028     History  Chief Complaint  Patient presents with   Altered Mental Status    Juan Clarke is a 42 y.o. male.  Family called EMS because the patient was altered.  He reports that he has been drinking all day, smoking marijuana and taking Klonopin.  Reports that he smokes marijuana and drinks at least 6 beers every day.       Home Medications Prior to Admission medications   Medication Sig Start Date End Date Taking? Authorizing Provider  atorvastatin (LIPITOR) 10 MG tablet Take 10 mg by mouth daily. 03/08/19   [provider]  carbamazepine (TEGRETOL XR) 200 MG 12 hr tablet Take 2 tablets (400 mg total) by mouth 2 (two) times daily. Patient taking differently: Take 200 mg by mouth 2 (two) times daily as needed (spasm).  12/16/16   Melvenia Beam, MD  clonazePAM (KLONOPIN) 0.5 MG tablet Take 0.5 mg by mouth 2 (two) times daily as needed for anxiety.  06/07/14   [provider]  HYDROcodone-acetaminophen (NORCO) 5-325 MG tablet Take 1 tablet by mouth every 6 (six) hours as needed for moderate pain. [id number]   Lianne Cure, DO  levothyroxine (SYNTHROID) 50 MCG tablet Take 50 mcg by mouth daily before breakfast. 01/03/19   [provider]  lisinopril-hydrochlorothiazide (ZESTORETIC) 20-25 MG tablet Take 1 tablet by mouth daily. 12/15/18   [provider]  meclizine (ANTIVERT) 12.5 MG tablet Take 1 tablet (12.5 mg total) by mouth 3 (three) times daily as needed for dizziness. 03/13/19   Palumbo, April, MD  ondansetron (ZOFRAN ODT) 8 MG disintegrating tablet 8mg  ODT q8 hours prn nausea 03/13/19   Palumbo, April, MD  PARoxetine (PAXIL) 20 MG tablet Take 20 mg by mouth daily. 02/10/19   [provider]      Allergies    Lexapro [escitalopram oxalate] and Zoloft [sertraline hcl]    Review of Systems   Review of  Systems  Physical Exam Updated Vital Signs BP (!) 143/113   Pulse (!) 104   Temp 97.8 F (36.6 C) (Oral)   Resp 18   Ht 5\' 10"  (1.778 m)   Wt 113.4 kg   SpO2 98%   BMI 35.87 kg/m  Physical Exam Vitals and nursing note reviewed.  Constitutional:      General: He is not in acute distress.    Appearance: He is well-developed.  HENT:     Head: Normocephalic and atraumatic.     Mouth/Throat:     Mouth: Mucous membranes are moist.  Eyes:     General: Vision grossly intact. Gaze aligned appropriately.     Extraocular Movements: Extraocular movements intact.     Conjunctiva/sclera: Conjunctivae normal.  Cardiovascular:     Rate and Rhythm: Normal rate and regular rhythm.     Pulses: Normal pulses.     Heart sounds: Normal heart sounds, S1 normal and S2 normal. No murmur heard.    No friction rub. No gallop.  Pulmonary:     Effort: Pulmonary effort is normal. No respiratory distress.     Breath sounds: Normal breath sounds.  Abdominal:     Palpations: Abdomen is soft.     Tenderness: There is no abdominal tenderness. There is no guarding or rebound.     Hernia: No hernia is present.  Musculoskeletal:        General:  No swelling.     Cervical back: Full passive range of motion without pain, normal range of motion and neck supple. No pain with movement, spinous process tenderness or muscular tenderness. Normal range of motion.     Right lower leg: No edema.     Left lower leg: No edema.  Skin:    General: Skin is warm and dry.     Capillary Refill: Capillary refill takes less than 2 seconds.     Findings: No ecchymosis, erythema, lesion or wound.  Neurological:     Mental Status: He is alert and oriented to person, place, and time.     GCS: GCS eye subscore is 4. GCS verbal subscore is 5. GCS motor subscore is 6.     Cranial Nerves: Cranial nerves 2-12 are intact.     Sensory: Sensation is intact.     Motor: Motor function is intact. No weakness or abnormal muscle tone.      Coordination: Coordination is intact.  Psychiatric:        Mood and Affect: Mood normal.        Speech: Speech is slurred.        Behavior: Behavior normal.     ED Results / Procedures / Treatments   Labs (all labs ordered are listed, but only abnormal results are displayed) Labs Reviewed  CBC WITH DIFFERENTIAL/PLATELET - Abnormal; Notable for the following components:      Result Value   WBC 3.2 (*)    RBC 4.09 (*)    HCT 38.6 (*)    All other components within normal limits  COMPREHENSIVE METABOLIC PANEL - Abnormal; Notable for the following components:   Glucose, Bld 125 (*)    AST 60 (*)    ALT 59 (*)    All other components within normal limits  ETHANOL - Abnormal; Notable for the following components:   Alcohol, Ethyl (B) 139 (*)    All other components within normal limits  PROTIME-INR  AMMONIA  RAPID URINE DRUG SCREEN, HOSP PERFORMED  URINALYSIS, ROUTINE W REFLEX MICROSCOPIC    EKG EKG Interpretation  Date/Time:  Sunday November 28 2022 00:32:25 EDT Ventricular Rate:  123 PR Interval:  132 QRS Duration: 95 QT Interval:  328 QTC Calculation: 470 R Axis:   54 Text Interpretation: Sinus tachycardia Inferior infarct, old Confirmed by Orpah Greek [phone]) on 11/28/2022 2:21:41 AM  Radiology CT HEAD WO CONTRAST (5MM)  Result Date: 11/28/2022 CLINICAL DATA:  Mental status change, unknown cause. EXAM: CT HEAD WITHOUT CONTRAST TECHNIQUE: Contiguous axial images were obtained from the base of the skull through the vertex without intravenous contrast. RADIATION DOSE REDUCTION: This exam was performed according to the departmental dose-optimization program which includes automated exposure control, adjustment of the mA and/or kV according to patient size and/or use of iterative reconstruction technique. COMPARISON:  11/14/2014. FINDINGS: Brain: No acute intracranial hemorrhage, midline shift or mass effect. No extra-axial fluid collection. Gray-white matter  differentiation is within normal limits. No hydrocephalus. Vascular: No hyperdense vessel or unexpected calcification. Skull: Normal. Negative for fracture or focal lesion. Sinuses/Orbits: No acute finding. Other: None. IMPRESSION: No acute intracranial process. Electronically Signed   By: Brett Fairy M.D.   On: 11/28/2022 01:30    Procedures Procedures    Medications Ordered in ED Medications  thiamine (VITAMIN B1) tablet 100 mg (has no administration in time range)    Or  thiamine (VITAMIN B1) injection 100 mg (has no administration in time range)  folic acid (  FOLVITE) tablet 1 mg (has no administration in time range)  multivitamin with minerals tablet 1 tablet (has no administration in time range)  LORazepam (ATIVAN) tablet 0-4 mg (has no administration in time range)    Followed by  LORazepam (ATIVAN) tablet 0-4 mg (has no administration in time range)  lactated ringers bolus 1,000 mL (has no administration in time range)    ED Course/ Medical Decision Making/ A&P                             Medical Decision Making Amount and/or Complexity of Data Reviewed Labs: ordered. Decision-making details documented in ED Course. Radiology: ordered and independent interpretation performed. Decision-making details documented in ED Course. ECG/medicine tests: ordered and independent interpretation performed. Decision-making details documented in ED Course.  Risk OTC drugs. Prescription drug management.   Patient brought to the emergency department by ambulance from home.  Family had called EMS because he was altered.  Mother is now present in the ED and gives additional information.  Patient has reportedly been depressed for some time.  He is currently out of work.  Patient reports daily marijuana use.  He also is prescribed Klonopin for his anxiety.  The wife gave him 2 Klonopin tablets around 10 PM.  He was then found with a bottle, unclear if he had taken any additional Klonopin  tablets.  Patient had been drinking earlier in the day as well, mother reports that she smelled it on him.  Mother reports that he told her that he has been cutting back on his drinking.  He appears delirious currently.  No focal findings on exam.  Head CT unremarkable.  Alcohol level 139.  Altered mental status currently is likely secondary to his drug and alcohol use.  Has not had any seizures.   He is mildly hypertensive and tachycardic.  I do have some concern that he drinks much more heavily than his mother thinks and that he may be exhibiting early stages of alcohol withdrawal.  Symptoms may be somewhat mitigated by his Klonopin use. He will require inpatient monitoring.  CRITICAL CARE Performed by: Orpah Greek   Total critical care time: 30 minutes  Critical care time was exclusive of separately billable procedures and treating other patients.  Critical care was necessary to treat or prevent imminent or life-threatening deterioration.  Critical care was time spent personally by me on the following activities: development of treatment plan with patient and/or surrogate as well as nursing, discussions with consultants, evaluation of patient's response to treatment, examination of patient, obtaining history from patient or surrogate, ordering and performing treatments and interventions, ordering and review of laboratory studies, ordering and review of radiographic studies, pulse oximetry and re-evaluation of patient's condition.         Final Clinical Impression(s) / ED Diagnoses Final diagnoses:  Delirium    Rx / DC Orders ED Discharge Orders     None         Masato Pettie, Gwenyth Allegra, MD 11/28/22 [phone]

## 2022-11-29 DIAGNOSIS — F1010 Alcohol abuse, uncomplicated: Secondary | ICD-10-CM | POA: Diagnosis not present

## 2022-11-29 LAB — CBC
HCT: 35.1 % — ABNORMAL LOW (ref 39.0–52.0)
Hemoglobin: 11.5 g/dL — ABNORMAL LOW (ref 13.0–17.0)
MCH: 32.5 pg (ref 26.0–34.0)
MCHC: 32.8 g/dL (ref 30.0–36.0)
MCV: 99.2 fL (ref 80.0–100.0)
Platelets: 137 10*3/uL — ABNORMAL LOW (ref 150–400)
RBC: 3.54 MIL/uL — ABNORMAL LOW (ref 4.22–5.81)
RDW: 14.5 % (ref 11.5–15.5)
WBC: 4.4 10*3/uL (ref 4.0–10.5)
nRBC: 0 % (ref 0.0–0.2)

## 2022-11-29 LAB — COMPREHENSIVE METABOLIC PANEL
ALT: 46 U/L — ABNORMAL HIGH (ref 0–44)
AST: 47 U/L — ABNORMAL HIGH (ref 15–41)
Albumin: 3.6 g/dL (ref 3.5–5.0)
Alkaline Phosphatase: 77 U/L (ref 38–126)
Anion gap: 13 (ref 5–15)
BUN: 11 mg/dL (ref 6–20)
CO2: 21 mmol/L — ABNORMAL LOW (ref 22–32)
Calcium: 8.8 mg/dL — ABNORMAL LOW (ref 8.9–10.3)
Chloride: 103 mmol/L (ref 98–111)
Creatinine, Ser: 1.06 mg/dL (ref 0.61–1.24)
GFR, Estimated: >60 mL/min (ref >60)
Glucose, Bld: 96 mg/dL (ref 70–99)
Potassium: 3.5 mmol/L (ref 3.5–5.1)
Sodium: 137 mmol/L (ref 135–145)
Total Bilirubin: 0.9 mg/dL (ref 0.3–1.2)
Total Protein: 6 g/dL — ABNORMAL LOW (ref 6.5–8.1)

## 2022-11-29 MED ORDER — LORAZEPAM 1 MG PO TABS
0.0000 mg | ORAL_TABLET | Freq: Four times a day (QID) | ORAL | Status: DC
  Administered 2022-11-29: 1 mg via ORAL
  Filled 2022-11-29 (×2): qty 1

## 2022-11-29 MED ORDER — LORAZEPAM 1 MG PO TABS
1.0000 mg | ORAL_TABLET | ORAL | Status: DC | PRN
  Administered 2022-11-29 – 2022-11-30 (×3): 1 mg via ORAL
  Filled 2022-11-29 (×2): qty 1

## 2022-11-29 MED ORDER — LORAZEPAM 1 MG PO TABS: 0.0000 mg | ORAL_TABLET | Freq: Two times a day (BID) | ORAL | Status: DC

## 2022-11-29 NOTE — Progress Notes (Signed)
[facility] PROGRESS NOTE  Dayna Eiben Atiyeh (DOB: 23-Mar-1981) [id number] PCP: Kathyrn Lass, MD  Brief Narrative: Juan Clarke is a 42 y.o. male with a history of substance abuse undergoing counseling, GAD on clonazepam, HTN who presented to the ED due to altered mental status on 11/28/2022 after a binge of alcohol, clonazepam, marijuana. He was admitted for alcohol withdrawal.  Subjective: Feeling better, less tremulous, still +anxiety which is multifactorial. Has already reached out to substance abuse counselor who will see him the day after he is discharged  Objective: BP (!) 145/92 (BP Location: Left Arm)   Pulse 87   Temp 98 F (36.7 C) (Oral)   Resp 20   Ht 5\' 10"  (1.778 m)   Wt 116.6 kg   SpO2 95%   BMI 36.88 kg/m   Gen: Nontoxic Pulm: Clear, nonlabored  CV: RRR, rate in 80's, no MRG or edema noted GI: Soft, NT, ND, +BS  Neuro: Alert and oriented. No new focal deficits. Ext: Warm, no deformities. Skin: No rashes, lesions or ulcers on visualized skin   Assessment & Plan: Alcohol, benzodiazepine, marijuana intoxication: EtOH level elevated at admission. This represents a binge/relapse. No self harm intent.  Alcohol abuse and withdrawal:  - Continue librium taper. CIWA scores 13, 2, 2, 2, 4, 11. Can downgrade to med-surg with ativan per protocol but remain admitted.   - Vitamins, minerals - Pt to follow up with counselor within 24 hours of discharge. TOC also consulted  HTN: Resume home lisinopril, can likely restart HCTZ as well once vital signs stabilize further, continue new clonidine for now.   GAD:  - Will resume home clonazepam once no longer requiring librium and ativan. Discussed how this complicates his abstinence/recovery.  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 He does not appear to have hepatic encephalopathy, no hx of same, will DC lactulose and monitor for now. LFTs improving.  Migraine:  Quiescent  Leukopenia: Suspect alcohol-related. No fever. Resolved.   Thrombocytopenia: Mild, no bleeding.  Patrecia Pour, MD [facility] [email or web address] 11/29/2022, 12:15 PM

## 2022-11-29 NOTE — Discharge Instructions (Signed)
                  Intensive Outpatient Programs  [facility]    [facility] [address]     [address] [place],  [place]     [place], [place] [phone]      [phone]  [facility]   [facility]  (Inpatient and outpatient)  [phone] (Suboxone and Methadone) [address]           [phone]           [facility]    [facility] - Intensive Outpatient [address]     [address] [place], [place] [address]     [place], [place]  [phone]      [phone]  [facility] (Outpatient, Inpatient, [facility]  [facility] (Groups and [facility]) (insurance only) [phone]    [place], [place]          [phone]       [facility]    [facility] (for caregivers and family) [address]     [address] [place], [place]     [place], [place] [phone]      [phone]  Residential Treatment Programs  [facility]  [facility](2 years) Residential: 90 days)  [facility] [address]      [address] [place], [place]     [place], [place] [phone]      [phone] or [phone]  [facility]    [facility] [address]      [address] [place], [place]     [place], [place] [phone]      [phone]  [facility]   [facility] ([facility] [address]     [address] [place], [place] [address]     [place], [place] [phone]      [phone] Admissions: 8am-3pm M-F  [facility]: Residential Program (90 Days)              [facility]: [facility]  [place], [place]     [place],   [phone] or [phone]    (Walk in Hours over the weekend or by referral)   [facility]: [facility]:[phone] (for crisis  response 24 hours a day) 

## 2022-11-29 NOTE — TOC Initial Note (Signed)
Transition of Care [facility]) - Initial/Assessment Note    Patient Details  Name: Juan Clarke MRN: [id number] Date of Birth: 03/23/1981  [facility] CM/SW Contact:    Cyndi Bender, RN Phone Number: 11/29/2022, 1:13 PM  Clinical Narrative:                 Spoke to patient regarding Substance abuse resources.  Patient states [facility].  Patient has transportation home at discharge.  No other TOC needs.   Expected Discharge Plan: Home/Self Care Barriers to Discharge: Continued Medical Work up   Patient Goals and CMS Choice Patient states their goals for this hospitalization and ongoing recovery are:: return home          Expected Discharge Plan and Services                                              Prior Living Arrangements/Services     Patient language and need for interpreter reviewed:: Yes        Need for Family Participation in Patient Care: Yes (Comment) Care giver support system in place?: Yes (comment)   Criminal Activity/Legal Involvement Pertinent to Current Situation/Hospitalization: No - Comment as needed  Activities of Daily Living Home Assistive Devices/Equipment: None ADL Screening (condition at time of admission) Patient's cognitive ability adequate to safely complete daily activities?: Yes Is the patient deaf or have difficulty hearing?: No Does the patient have difficulty seeing, even when wearing glasses/contacts?: No Does the patient have difficulty concentrating, remembering, or making decisions?: No Patient able to express need for assistance with ADLs?: Yes Does the patient have difficulty dressing or bathing?: No Independently performs ADLs?: Yes (appropriate for developmental age) Communication: Independent Dressing (OT): Independent Grooming: Independent Feeding: Independent Bathing: Independent Toileting: Independent In/Out Bed: Independent Walks in Home:  Independent Does the patient have difficulty walking or climbing stairs?: No Weakness of Legs: None Weakness of Arms/Hands: None  Permission Sought/Granted                  Emotional Assessment Appearance:: Appears stated age Attitude/Demeanor/Rapport: Gracious Affect (typically observed): Accepting Orientation: : Oriented to Self, Oriented to Place, Oriented to  Time, Oriented to Situation Alcohol / Substance Use: Not Applicable Psych Involvement: No (comment)  Admission diagnosis:  Delirium [R41.0] ETOH abuse [F10.10] Patient Active Problem List   Diagnosis Date Noted   ETOH abuse 11/28/2022   Neck pain 02/25/2015   Headache 02/25/2015   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   Occipital neuralgia 07/13/2014   Generalized anxiety disorder 07/13/2014   PCP:  Kathyrn Lass, MD Pharmacy:   [id number] [place], [place] [address] Phone: [phone] Fax: [phone]  [id number] [place], [place] - [place] [id number] [place] [id number] Phone: [phone] Fax: [place], [place] [address] Phone: [phone] Fax: [phone]     Social Determinants of Health (SDOH) Social History: [place]: No Food Insecurity (11/28/2022)  Housing: Low Risk  (11/28/2022)  Transportation Needs: No Transportation Needs (11/28/2022)  Utilities: Not At Risk (11/28/2022)  Tobacco Use: Low Risk  (  11/28/2022)   SDOH Interventions:     Readmission Risk Interventions     No data to display

## 2022-11-30 ENCOUNTER — Other Ambulatory Visit (HOSPITAL_COMMUNITY): Payer: Self-pay

## 2022-11-30 DIAGNOSIS — F1010 Alcohol abuse, uncomplicated: Secondary | ICD-10-CM | POA: Diagnosis not present

## 2022-11-30 MED ORDER — CHLORDIAZEPOXIDE HCL 25 MG PO CAPS
ORAL_CAPSULE | ORAL | 0 refills | Status: AC
  Filled 2022-11-30: qty 5, 2d supply, fill #0

## 2022-11-30 NOTE — Discharge Summary (Signed)
Physician Discharge Summary   Patient: Juan Clarke MRN: [id number] DOB: August 28, 1981  Admit date:     11/28/2022  Discharge date: 11/30/22  Discharge Physician: Patrecia Pour   PCP: Kathyrn Lass, MD   Recommendations at discharge:  Follow up with PCP in 1-2 weeks, suggest recheck CBC and CMP.   Discharge Diagnoses: Principal Problem:   ETOH abuse   Hospital Course: Juan Clarke is a 42 y.o. male with a history of substance abuse undergoing counseling, GAD on clonazepam, HTN who presented to the ED due to altered mental status on 11/28/2022 after a binge of alcohol, clonazepam, marijuana. He was admitted for alcohol withdrawal management and subsequently stabilized. He will complete a librium taper as an outpatient and continue close follow up with substance abuse counselor after discharge.   Assessment and Plan: Alcohol, benzodiazepine, marijuana intoxication: EtOH level elevated at admission. This represents a binge/relapse. No self harm intent.   Alcohol abuse and withdrawal:  - Continue librium taper. CIWA scores much lower, not requiring frequent ativan, appears stable with anticipated peak of symptoms now behind [place]. - Pt to follow up with counselor within 24 hours of discharge. TOC also consulted   HTN: Resume home medications. Given clonidine during acute withdrawal phase.   GAD:  - Will resume home clonazepam prn. Discussed how this complicates his abstinence/recovery.   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 He does not appear to have hepatic encephalopathy, no hx of same, will DC lactulose and monitor for now. LFTs improving.   Migraine: Quiescent   Leukopenia: Suspect alcohol-related. No fever. Resolved.    Thrombocytopenia: Mild, no bleeding.  Consultants: None Procedures performed: None  Disposition: Home Diet recommendation: Regular DISCHARGE MEDICATION: Allergies as of 11/30/2022       Reactions   Lexapro  [escitalopram Oxalate] Other (See Comments)   Lack of therapeutic effect    Zoloft [sertraline Hcl] Other (See Comments)   Lack of therapeutic effect         Medication List     STOP taking these medications    carbamazepine 200 MG 12 hr tablet Commonly known as: TEGRETOL XR       TAKE these medications    atorvastatin 10 MG tablet Commonly known as: LIPITOR Take 10 mg by mouth daily.   chlordiazePOXIDE 25 MG capsule Commonly known as: LIBRIUM Take 1 capsule (25 mg total) by mouth 3 (three) times daily for 1 day, THEN 1 capsule (25 mg total) 2 (two) times daily for 1 day, THEN 1 capsule (25 mg total) daily for 1 day. Start afternoon 4/2. Start taking on: November 30, 2022   clonazePAM 0.5 MG tablet Commonly known as: KLONOPIN Take 0.5 mg by mouth 2 (two) times daily as needed for anxiety.   levothyroxine 50 MCG tablet Commonly known as: SYNTHROID Take 50 mcg by mouth daily before breakfast.   lisinopril-hydrochlorothiazide 20-25 MG tablet Commonly known as: ZESTORETIC Take 1 tablet by mouth daily.   meclizine 12.5 MG tablet Commonly known as: ANTIVERT Take 1 tablet (12.5 mg total) by mouth 3 (three) times daily as needed for dizziness.   ondansetron 8 MG disintegrating tablet Commonly known as: Zofran ODT 8mg  ODT q8 hours prn nausea What changed:  how much to take how to take this when to take this reasons to take this additional instructions   PARoxetine 20 MG tablet Commonly known as: PAXIL Take 20 mg by mouth daily.  Follow-up Information     Kathyrn Lass, MD Follow up.   Specialty: Family Medicine Contact information: [place] [address] [phone]                Discharge Exam: Filed Weights   11/28/22 0520 11/29/22 0613 11/30/22 0500  Weight: 117.8 kg 116.6 kg 116.3 kg  BP (!) 146/98 (BP Location: Left Arm)   Pulse 87   Temp (!) 97.5 F (36.4 C) (Oral)   Resp 18   Ht 5\' 10"  (1.778 m)   Wt 116.3  kg   SpO2 98%   BMI 36.79 kg/m   No distress, well-appearing male Clear, nonlabored RRR, no MRG Alert, oriented, appropriate, calm, no tremor  Condition at discharge: stable  The results of significant diagnostics from this hospitalization (including imaging, microbiology, ancillary and laboratory) are listed below for reference.   Imaging Studies: CT HEAD WO CONTRAST (5MM)  Result Date: 11/28/2022 CLINICAL DATA:  Mental status change, unknown cause. EXAM: CT HEAD WITHOUT CONTRAST TECHNIQUE: Contiguous axial images were obtained from the base of the skull through the vertex without intravenous contrast. RADIATION DOSE REDUCTION: This exam was performed according to the departmental dose-optimization program which includes automated exposure control, adjustment of the mA and/or kV according to patient size and/or use of iterative reconstruction technique. COMPARISON:  11/14/2014. FINDINGS: Brain: No acute intracranial hemorrhage, midline shift or mass effect. No extra-axial fluid collection. Gray-white matter differentiation is within normal limits. No hydrocephalus. Vascular: No hyperdense vessel or unexpected calcification. Skull: Normal. Negative for fracture or focal lesion. Sinuses/Orbits: No acute finding. Other: None. IMPRESSION: No acute intracranial process. Electronically Signed   By: Brett Fairy M.D.   On: 11/28/2022 01:30    Microbiology: Results for orders placed or performed during the hospital encounter of 11/28/22  MRSA Next Gen by PCR, Nasal     Status: None   Collection Time: 11/28/22  5:40 AM   Specimen: Nasal Mucosa; Nasal Swab  Result Value Ref Range Status   MRSA by PCR Next Gen NOT DETECTED NOT DETECTED Final    Comment: (NOTE) The GeneXpert MRSA Assay (FDA approved for NASAL specimens only), is one component of a comprehensive MRSA colonization surveillance program. It is not intended to diagnose MRSA infection nor to guide or monitor treatment for MRSA  infections. Test performance is not FDA approved in patients less than 57 years old. Performed at [facility], [place] [address] [place], [place] [address]     Labs: CBC: Recent Labs  Lab 11/28/22 0046 11/28/22 0424 11/29/22 0021  WBC 3.2* 3.8* 4.4  NEUTROABS 1.7 2.3  --   HGB 13.0 12.0* 11.5*  HCT 38.6* 34.8* 35.1*  MCV 94.4 95.1 99.2  PLT 154 153 0000000*   Basic Metabolic Panel: Recent Labs  Lab 11/28/22 0046 11/28/22 0424 11/29/22 0021  NA 137 137 137  K 3.5 3.8 3.5  CL 100 103 103  CO2 24 21* 21*  GLUCOSE 125* 110* 96  BUN 7 8 11   CREATININE 1.03 0.97 1.06  CALCIUM 8.9 9.1 8.8*   Liver Function Tests: Recent Labs  Lab 11/28/22 0046 11/28/22 0424 11/29/22 0021  AST 60* 50* 47*  ALT 59* 54* 46*  ALKPHOS 81 72 77  BILITOT 0.6 0.7 0.9  PROT 6.6 6.3* 6.0*  ALBUMIN 4.0 3.8 3.6   CBG: No results for input(s): "GLUCAP" in the last 168 hours.  Discharge time spent: greater than 30 minutes.  Signed: Patrecia Pour,  MD [facility] 11/30/2022

## 2023-07-12 ENCOUNTER — Emergency Department (HOSPITAL_COMMUNITY)
Admission: EM | Admit: 2023-07-12 | Discharge: 2023-07-12 | Disposition: A | Discharge status: Home / Self Care | Payer: BC Managed Care – PPO | Attending: Emergency Medicine | Admitting: Emergency Medicine

## 2023-07-12 ENCOUNTER — Encounter (HOSPITAL_COMMUNITY): Payer: Self-pay

## 2023-07-12 DIAGNOSIS — R000 Tachycardia, unspecified: Secondary | ICD-10-CM | POA: Insufficient documentation

## 2023-07-12 DIAGNOSIS — R42 Dizziness and giddiness: Secondary | ICD-10-CM | POA: Diagnosis present

## 2023-07-12 DIAGNOSIS — T424X1A Poisoning by benzodiazepines, accidental (unintentional), initial encounter: Secondary | ICD-10-CM | POA: Insufficient documentation

## 2023-07-12 DIAGNOSIS — I10 Essential (primary) hypertension: Secondary | ICD-10-CM | POA: Insufficient documentation

## 2023-07-12 DIAGNOSIS — T50901A Poisoning by unspecified drugs, medicaments and biological substances, accidental (unintentional), initial encounter: Secondary | ICD-10-CM

## 2023-07-12 DIAGNOSIS — Z79899 Other long term (current) drug therapy: Secondary | ICD-10-CM | POA: Diagnosis not present

## 2023-07-12 LAB — I-STAT VENOUS BLOOD GAS, ED
Acid-base deficit: 2 mmol/L (ref 0.0–2.0)
Bicarbonate: 18.7 mmol/L — ABNORMAL LOW (ref 20.0–28.0)
Calcium, Ion: 0.98 mmol/L — ABNORMAL LOW (ref 1.15–1.40)
HCT: 50 % (ref 39.0–52.0)
Hemoglobin: 17 g/dL (ref 13.0–17.0)
O2 Saturation: 93 %
Potassium: 3.5 mmol/L (ref 3.5–5.1)
Sodium: 138 mmol/L (ref 135–145)
TCO2: 19 mmol/L — ABNORMAL LOW (ref 22–32)
pCO2, Ven: 24.1 mm[Hg] — ABNORMAL LOW (ref 44–60)
pH, Ven: 7.498 — ABNORMAL HIGH (ref 7.25–7.43)
pO2, Ven: 58 mm[Hg] — ABNORMAL HIGH (ref 32–45)

## 2023-07-12 LAB — I-STAT CHEM 8, ED
BUN: 10 mg/dL (ref 6–20)
Calcium, Ion: 0.96 mmol/L — ABNORMAL LOW (ref 1.15–1.40)
Chloride: 104 mmol/L (ref 98–111)
Creatinine, Ser: 0.9 mg/dL (ref 0.61–1.24)
Glucose, Bld: 115 mg/dL — ABNORMAL HIGH (ref 70–99)
HCT: 49 % (ref 39.0–52.0)
Hemoglobin: 16.7 g/dL (ref 13.0–17.0)
Potassium: 3.5 mmol/L (ref 3.5–5.1)
Sodium: 139 mmol/L (ref 135–145)
TCO2: 19 mmol/L — ABNORMAL LOW (ref 22–32)

## 2023-07-12 LAB — CBC
HCT: 47.2 % (ref 39.0–52.0)
Hemoglobin: 16.4 g/dL (ref 13.0–17.0)
MCH: 31.8 pg (ref 26.0–34.0)
MCHC: 34.7 g/dL (ref 30.0–36.0)
MCV: 91.7 fL (ref 80.0–100.0)
Platelets: 279 10*3/uL (ref 150–400)
RBC: 5.15 MIL/uL (ref 4.22–5.81)
RDW: 14 % (ref 11.5–15.5)
WBC: 9.3 10*3/uL (ref 4.0–10.5)
nRBC: 0 % (ref 0.0–0.2)

## 2023-07-12 LAB — ETHANOL: Alcohol, Ethyl (B): <10 mg/dL (ref <10)

## 2023-07-12 NOTE — ED Triage Notes (Signed)
Pt is coming in because he took 1mg  too much of his Klonopin, pt states his last drink was 5pm. Pt states he also is coming in cause he feels dizzy and disoriented and the room was spinning.   Medic vitals   175/108 118hr 96%ra 20rr  123bgl

## 2023-07-12 NOTE — Discharge Instructions (Signed)
Your workup tonight was reassuring.  Please take your medications as directed.  Please do not take any extra medication beyond what is prescribed.  Follow-up as new with your primary care provider.  If you develop life-threatening symptoms return to the emergency department.

## 2023-07-12 NOTE — ED Provider Triage Note (Signed)
Emergency Medicine Provider Triage Evaluation Note  Juan Clarke , a 42 y.o. male  was evaluated in triage.  Pt complains of lightheadedness secondary to taking an extra Klonopin. Denies chest pain, SOB, abdominal pain  Review of Systems  Positive:  Negative:   Physical Exam  BP (!) 145/114 (BP Location: Right Arm)   Pulse (!) 108   Resp 19   SpO2 98%  Gen:   Awake, no distress   Resp:  Normal effort  MSK:   Moves extremities without difficulty  Other:    Medical Decision Making  Medically screening exam initiated at 12:46 AM.  Appropriate orders placed.  Juan Clarke was informed that the remainder of the evaluation will be completed by another provider, this initial triage assessment does not replace that evaluation, and the importance of remaining in the ED until their evaluation is complete.     Darrick Grinder, PA-C 07/12/23 [phone]

## 2023-07-12 NOTE — ED Provider Notes (Signed)
[place] EMERGENCY DEPARTMENT AT [facility] Provider Note   CSN: [id number] Arrival date & time: 07/12/23  0041     History  Chief Complaint  Patient presents with   Medical problem    Juan Clarke is a 42 y.o. male.  Patient presents to the emerged part with concerns over lightheadedness secondary to taking an extra milligram of Klonopin.  Patient states he took it with no intent to harm himself.  He states he felt he needed an extra dose tonight.  He denies any other complaints.  Past medical history significant for alcohol abuse, hypertension, generalized anxiety disorder, insomnia, trigeminal neuralgia  HPI     Home Medications Prior to Admission medications   Medication Sig Start Date End Date Taking? Authorizing Provider  atorvastatin (LIPITOR) 10 MG tablet Take 10 mg by mouth daily. 03/08/19   [provider]  clonazePAM (KLONOPIN) 0.5 MG tablet Take 0.5 mg by mouth 2 (two) times daily as needed for anxiety.  06/07/14   [provider]  levothyroxine (SYNTHROID) 50 MCG tablet Take 50 mcg by mouth daily before breakfast. 01/03/19   [provider]  lisinopril-hydrochlorothiazide (ZESTORETIC) 20-25 MG tablet Take 1 tablet by mouth daily. 12/15/18   [provider]  meclizine (ANTIVERT) 12.5 MG tablet Take 1 tablet (12.5 mg total) by mouth 3 (three) times daily as needed for dizziness. Patient not taking: Reported on 11/28/2022 03/13/19   Palumbo, April, MD  ondansetron (ZOFRAN ODT) 8 MG disintegrating tablet 8mg  ODT q8 hours prn nausea Patient taking differently: Take 8 mg by mouth every 8 (eight) hours as needed for nausea. 03/13/19   Palumbo, April, MD  PARoxetine (PAXIL) 20 MG tablet Take 20 mg by mouth daily. 02/10/19   [provider]      Allergies    Lexapro [escitalopram oxalate] and Zoloft [sertraline hcl]    Review of Systems   Review of Systems  Physical Exam Updated Vital Signs BP (!) 140/111 (BP  Location: Left Arm)   Pulse (!) 103   Temp (!) 97.5 F (36.4 C) (Oral)   Resp 18   SpO2 98%  Physical Exam Vitals and nursing note reviewed.  Constitutional:      General: He is not in acute distress.    Appearance: He is well-developed.  HENT:     Head: Normocephalic and atraumatic.  Eyes:     Extraocular Movements: Extraocular movements intact.     Conjunctiva/sclera: Conjunctivae normal.     Pupils: Pupils are equal, round, and reactive to light.  Cardiovascular:     Rate and Rhythm: Normal rate and regular rhythm.     Heart sounds: No murmur heard.    Comments: Tachycardic upon arrival, heart rate returned to normal range after arrival Pulmonary:     Effort: Pulmonary effort is normal. No respiratory distress.     Breath sounds: Normal breath sounds.  Abdominal:     Palpations: Abdomen is soft.     Tenderness: There is no abdominal tenderness.  Musculoskeletal:        General: No swelling.     Cervical back: Neck supple.  Skin:    General: Skin is warm and dry.     Capillary Refill: Capillary refill takes less than 2 seconds.  Neurological:     General: No focal deficit present.     Mental Status: He is alert and oriented to person, place, and time.     Sensory: No sensory deficit.  Motor: No weakness.  Psychiatric:        Mood and Affect: Mood normal.     ED Results / Procedures / Treatments   Labs (all labs ordered are listed, but only abnormal results are displayed) Labs Reviewed  I-STAT CHEM 8, ED - Abnormal; Notable for the following components:      Result Value   Glucose, Bld 115 (*)    Calcium, Ion 0.96 (*)    TCO2 19 (*)    All other components within normal limits  I-STAT VENOUS BLOOD GAS, ED - Abnormal; Notable for the following components:   pH, Ven 7.498 (*)    pCO2, Ven 24.1 (*)    pO2, Ven 58 (*)    Bicarbonate 18.7 (*)    TCO2 19 (*)    Calcium, Ion 0.98 (*)    All other components within normal limits  CBC  ETHANOL    EKG EKG  Interpretation Date/Time:  Tuesday July 12 2023 00:50:38 EST Ventricular Rate:  122 PR Interval:  126 QRS Duration:  84 QT Interval:  330 QTC Calculation: 470 R Axis:   119  Text Interpretation: Sinus tachycardia Left posterior fascicular block Confirmed by Nicanor Alcon, April ([id number]) on 07/12/2023 1:45:24 AM  Radiology No results found.  Procedures Procedures    Medications Ordered in ED Medications - No data to display  ED Course/ Medical Decision Making/ A&P                                 Medical Decision Making Amount and/or Complexity of Data Reviewed Labs: ordered.   Patient presents with concerns of lightheadedness over taking an extra dose of Klonopin.  Upon assessment patient is no longer lightheaded and is asymptomatic.  Lab work was grossly unremarkable.  EKG showed sinus tachycardia.  Tachycardia has resolved.  Patient states that he feels better at this time.  No sign of overdose.  Patient is steady on his feet.  Plan to discharge home with return precautions and recommendations for taking medication according to prescriber instructions.        Final Clinical Impression(s) / ED Diagnoses Final diagnoses:  Accidental overdose, initial encounter    Rx / DC Orders ED Discharge Orders     None         Pamala Duffel 07/12/23 2440    Palumbo, April, MD 07/12/23 1027

## 2023-08-10 ENCOUNTER — Encounter (HOSPITAL_COMMUNITY): Payer: Self-pay

## 2023-08-10 ENCOUNTER — Inpatient Hospital Stay (HOSPITAL_COMMUNITY)
Admission: EM | Admit: 2023-08-10 | Discharge: 2023-08-17 | DRG: 091 | Disposition: A | Discharge status: Home / Self Care | Payer: BC Managed Care – PPO | Attending: Internal Medicine | Admitting: Internal Medicine

## 2023-08-10 ENCOUNTER — Inpatient Hospital Stay (HOSPITAL_COMMUNITY): Payer: BC Managed Care – PPO

## 2023-08-10 ENCOUNTER — Other Ambulatory Visit: Payer: Self-pay

## 2023-08-10 ENCOUNTER — Emergency Department (HOSPITAL_COMMUNITY): Payer: BC Managed Care – PPO

## 2023-08-10 DIAGNOSIS — Z781 Physical restraint status: Secondary | ICD-10-CM

## 2023-08-10 DIAGNOSIS — F10231 Alcohol dependence with withdrawal delirium: Secondary | ICD-10-CM | POA: Diagnosis present

## 2023-08-10 DIAGNOSIS — E8729 Other acidosis: Secondary | ICD-10-CM | POA: Diagnosis present

## 2023-08-10 DIAGNOSIS — Z79899 Other long term (current) drug therapy: Secondary | ICD-10-CM

## 2023-08-10 DIAGNOSIS — F10931 Alcohol use, unspecified with withdrawal delirium: Secondary | ICD-10-CM | POA: Diagnosis not present

## 2023-08-10 DIAGNOSIS — N179 Acute kidney failure, unspecified: Secondary | ICD-10-CM | POA: Diagnosis present

## 2023-08-10 DIAGNOSIS — G9341 Metabolic encephalopathy: Secondary | ICD-10-CM | POA: Diagnosis present

## 2023-08-10 DIAGNOSIS — K7200 Acute and subacute hepatic failure without coma: Secondary | ICD-10-CM

## 2023-08-10 DIAGNOSIS — F419 Anxiety disorder, unspecified: Secondary | ICD-10-CM | POA: Diagnosis present

## 2023-08-10 DIAGNOSIS — K219 Gastro-esophageal reflux disease without esophagitis: Secondary | ICD-10-CM | POA: Diagnosis not present

## 2023-08-10 DIAGNOSIS — R791 Abnormal coagulation profile: Secondary | ICD-10-CM | POA: Diagnosis present

## 2023-08-10 DIAGNOSIS — Z79890 Hormone replacement therapy (postmenopausal): Secondary | ICD-10-CM | POA: Diagnosis not present

## 2023-08-10 DIAGNOSIS — E876 Hypokalemia: Secondary | ICD-10-CM | POA: Diagnosis not present

## 2023-08-10 DIAGNOSIS — K7682 Hepatic encephalopathy: Secondary | ICD-10-CM

## 2023-08-10 DIAGNOSIS — Z888 Allergy status to other drugs, medicaments and biological substances status: Secondary | ICD-10-CM

## 2023-08-10 DIAGNOSIS — K7010 Alcoholic hepatitis without ascites: Secondary | ICD-10-CM | POA: Diagnosis present

## 2023-08-10 DIAGNOSIS — K760 Fatty (change of) liver, not elsewhere classified: Secondary | ICD-10-CM | POA: Diagnosis present

## 2023-08-10 DIAGNOSIS — G928 Other toxic encephalopathy: Principal | ICD-10-CM | POA: Diagnosis present

## 2023-08-10 DIAGNOSIS — E039 Hypothyroidism, unspecified: Secondary | ICD-10-CM | POA: Diagnosis present

## 2023-08-10 DIAGNOSIS — F32A Depression, unspecified: Secondary | ICD-10-CM | POA: Diagnosis present

## 2023-08-10 DIAGNOSIS — E878 Other disorders of electrolyte and fluid balance, not elsewhere classified: Secondary | ICD-10-CM | POA: Diagnosis present

## 2023-08-10 DIAGNOSIS — R4182 Altered mental status, unspecified: Secondary | ICD-10-CM | POA: Diagnosis not present

## 2023-08-10 DIAGNOSIS — E669 Obesity, unspecified: Secondary | ICD-10-CM | POA: Diagnosis present

## 2023-08-10 DIAGNOSIS — F411 Generalized anxiety disorder: Secondary | ICD-10-CM | POA: Diagnosis present

## 2023-08-10 DIAGNOSIS — F10229 Alcohol dependence with intoxication, unspecified: Secondary | ICD-10-CM | POA: Diagnosis present

## 2023-08-10 DIAGNOSIS — Z813 Family history of other psychoactive substance abuse and dependence: Secondary | ICD-10-CM | POA: Diagnosis not present

## 2023-08-10 DIAGNOSIS — Z808 Family history of malignant neoplasm of other organs or systems: Secondary | ICD-10-CM

## 2023-08-10 DIAGNOSIS — I10 Essential (primary) hypertension: Secondary | ICD-10-CM | POA: Diagnosis present

## 2023-08-10 DIAGNOSIS — Z818 Family history of other mental and behavioral disorders: Secondary | ICD-10-CM

## 2023-08-10 DIAGNOSIS — E785 Hyperlipidemia, unspecified: Secondary | ICD-10-CM | POA: Diagnosis present

## 2023-08-10 DIAGNOSIS — Y904 Blood alcohol level of 80-99 mg/100 ml: Secondary | ICD-10-CM | POA: Diagnosis present

## 2023-08-10 DIAGNOSIS — Z8249 Family history of ischemic heart disease and other diseases of the circulatory system: Secondary | ICD-10-CM

## 2023-08-10 LAB — LACTIC ACID, PLASMA
Lactic Acid, Venous: 2.7 mmol/L (ref 0.5–1.9)
Lactic Acid, Venous: 4.2 mmol/L (ref 0.5–1.9)

## 2023-08-10 LAB — I-STAT VENOUS BLOOD GAS, ED
Acid-base deficit: 1 mmol/L (ref 0.0–2.0)
Bicarbonate: 21.3 mmol/L (ref 20.0–28.0)
Calcium, Ion: 1.02 mmol/L — ABNORMAL LOW (ref 1.15–1.40)
HCT: 46 % (ref 39.0–52.0)
Hemoglobin: 15.6 g/dL (ref 13.0–17.0)
O2 Saturation: 97 %
Potassium: 3.9 mmol/L (ref 3.5–5.1)
Sodium: 137 mmol/L (ref 135–145)
TCO2: 22 mmol/L (ref 22–32)
pCO2, Ven: 28 mm[Hg] — ABNORMAL LOW (ref 44–60)
pH, Ven: 7.49 — ABNORMAL HIGH (ref 7.25–7.43)
pO2, Ven: 78 mm[Hg] — ABNORMAL HIGH (ref 32–45)

## 2023-08-10 LAB — COMPREHENSIVE METABOLIC PANEL
ALT: 113 U/L — ABNORMAL HIGH (ref 0–44)
AST: 167 U/L — ABNORMAL HIGH (ref 15–41)
Albumin: 4.2 g/dL (ref 3.5–5.0)
Alkaline Phosphatase: 86 U/L (ref 38–126)
Anion gap: 15 (ref 5–15)
BUN: 10 mg/dL (ref 6–20)
CO2: 17 mmol/L — ABNORMAL LOW (ref 22–32)
Calcium: 8.7 mg/dL — ABNORMAL LOW (ref 8.9–10.3)
Chloride: 103 mmol/L (ref 98–111)
Creatinine, Ser: 0.87 mg/dL (ref 0.61–1.24)
GFR, Estimated: >60 mL/min (ref >60)
Glucose, Bld: 135 mg/dL — ABNORMAL HIGH (ref 70–99)
Potassium: 4 mmol/L (ref 3.5–5.1)
Sodium: 135 mmol/L (ref 135–145)
Total Bilirubin: 1.2 mg/dL — ABNORMAL HIGH (ref <1.2)
Total Protein: 6.4 g/dL — ABNORMAL LOW (ref 6.5–8.1)

## 2023-08-10 LAB — BLOOD GAS, VENOUS
Acid-base deficit: 5.5 mmol/L — ABNORMAL HIGH (ref 0.0–2.0)
Bicarbonate: 16.7 mmol/L — ABNORMAL LOW (ref 20.0–28.0)
Drawn by: 6013
O2 Saturation: 96.9 %
Patient temperature: 37.4
pCO2, Ven: 24 mm[Hg] — ABNORMAL LOW (ref 44–60)
pH, Ven: 7.44 — ABNORMAL HIGH (ref 7.25–7.43)
pO2, Ven: 79 mm[Hg] — ABNORMAL HIGH (ref 32–45)

## 2023-08-10 LAB — PROTIME-INR
INR: 1.3 — ABNORMAL HIGH (ref 0.8–1.2)
Prothrombin Time: 16.8 s — ABNORMAL HIGH (ref 11.4–15.2)

## 2023-08-10 LAB — RAPID URINE DRUG SCREEN, HOSP PERFORMED
Amphetamines: NOT DETECTED
Barbiturates: NOT DETECTED
Benzodiazepines: NOT DETECTED
Cocaine: NOT DETECTED
Opiates: NOT DETECTED
Tetrahydrocannabinol: NOT DETECTED

## 2023-08-10 LAB — CBC
HCT: 44.1 % (ref 39.0–52.0)
Hemoglobin: 15.6 g/dL (ref 13.0–17.0)
MCH: 32.8 pg (ref 26.0–34.0)
MCHC: 35.4 g/dL (ref 30.0–36.0)
MCV: 92.8 fL (ref 80.0–100.0)
Platelets: 187 10*3/uL (ref 150–400)
RBC: 4.75 MIL/uL (ref 4.22–5.81)
RDW: 14.3 % (ref 11.5–15.5)
WBC: 4.4 10*3/uL (ref 4.0–10.5)
nRBC: 0 % (ref 0.0–0.2)

## 2023-08-10 LAB — ACETAMINOPHEN LEVEL: Acetaminophen (Tylenol), Serum: <10 ug/mL — ABNORMAL LOW (ref 10–30)

## 2023-08-10 LAB — AMMONIA: Ammonia: 165 umol/L — ABNORMAL HIGH (ref 9–35)

## 2023-08-10 LAB — T4, FREE: Free T4: 0.78 ng/dL (ref 0.61–1.12)

## 2023-08-10 LAB — PHOSPHORUS: Phosphorus: 6.2 mg/dL — ABNORMAL HIGH (ref 2.5–4.6)

## 2023-08-10 LAB — TSH: TSH: 8.579 u[IU]/mL — ABNORMAL HIGH (ref 0.350–4.500)

## 2023-08-10 LAB — ETHANOL: Alcohol, Ethyl (B): 86 mg/dL — ABNORMAL HIGH (ref <10)

## 2023-08-10 LAB — SALICYLATE LEVEL: Salicylate Lvl: <7 mg/dL — ABNORMAL LOW (ref 7.0–30.0)

## 2023-08-10 LAB — MAGNESIUM: Magnesium: 2 mg/dL (ref 1.7–2.4)

## 2023-08-10 LAB — MRSA NEXT GEN BY PCR, NASAL: MRSA by PCR Next Gen: NOT DETECTED

## 2023-08-10 MED ORDER — THIAMINE HCL 100 MG/ML IJ SOLN
100.0000 mg | Freq: Every day | INTRAMUSCULAR | Status: DC
  Administered 2023-08-10: 100 mg via INTRAVENOUS
  Filled 2023-08-10: qty 2

## 2023-08-10 MED ORDER — ADULT MULTIVITAMIN W/MINERALS CH
1.0000 | ORAL_TABLET | Freq: Every day | ORAL | Status: DC
Start: 2023-08-10 — End: 2023-08-11
  Administered 2023-08-11: 1 via ORAL
  Filled 2023-08-10: qty 1

## 2023-08-10 MED ORDER — LACTULOSE ENEMA: 300.0000 mL | Freq: Three times a day (TID) | ORAL | Status: DC

## 2023-08-10 MED ORDER — CHLORHEXIDINE GLUCONATE CLOTH 2 % EX PADS
6.0000 | MEDICATED_PAD | Freq: Every day | CUTANEOUS | Status: DC
  Administered 2023-08-10 – 2023-08-16 (×7): 6 via TOPICAL

## 2023-08-10 MED ORDER — HALOPERIDOL LACTATE 5 MG/ML IJ SOLN
10.0000 mg | Freq: Once | INTRAMUSCULAR | Status: AC
Start: 2023-08-10 — End: 2023-08-10
  Administered 2023-08-10: 10 mg via INTRAVENOUS
  Filled 2023-08-10: qty 2

## 2023-08-10 MED ORDER — THIAMINE HCL 100 MG/ML IJ SOLN
100.0000 mg | Freq: Every day | INTRAMUSCULAR | Status: DC
Start: 2023-08-17 — End: 2023-08-13

## 2023-08-10 MED ORDER — CHLORDIAZEPOXIDE HCL 25 MG PO CAPS: 25.0000 mg | ORAL_CAPSULE | Freq: Three times a day (TID) | ORAL | Status: DC

## 2023-08-10 MED ORDER — ONDANSETRON HCL 4 MG PO TABS: 4.0000 mg | ORAL_TABLET | Freq: Four times a day (QID) | ORAL | Status: DC | PRN

## 2023-08-10 MED ORDER — THIAMINE MONONITRATE 100 MG PO TABS: 100.0000 mg | ORAL_TABLET | Freq: Every day | ORAL | Status: DC

## 2023-08-10 MED ORDER — FOLIC ACID 1 MG PO TABS: 1.0000 mg | ORAL_TABLET | Freq: Every day | ORAL | Status: DC

## 2023-08-10 MED ORDER — DEXMEDETOMIDINE HCL IN NACL 400 MCG/100ML IV SOLN: 0.0000 ug/kg/h | INTRAVENOUS | Status: DC

## 2023-08-10 MED ORDER — CHLORDIAZEPOXIDE HCL 25 MG PO CAPS: 25.0000 mg | ORAL_CAPSULE | ORAL | Status: DC

## 2023-08-10 MED ORDER — SODIUM CHLORIDE 0.9 % IV BOLUS
1000.0000 mL | Freq: Once | INTRAVENOUS | Status: AC
  Administered 2023-08-10: 1000 mL via INTRAVENOUS

## 2023-08-10 MED ORDER — HALOPERIDOL LACTATE 5 MG/ML IJ SOLN
5.0000 mg | Freq: Four times a day (QID) | INTRAMUSCULAR | Status: DC | PRN
  Administered 2023-08-11 – 2023-08-13 (×4): 5 mg via INTRAVENOUS
  Filled 2023-08-10 (×7): qty 1

## 2023-08-10 MED ORDER — LORAZEPAM 2 MG/ML IJ SOLN
2.0000 mg | Freq: Once | INTRAMUSCULAR | Status: AC
Start: 2023-08-10 — End: 2023-08-10
  Administered 2023-08-10: 2 mg via INTRAVENOUS
  Filled 2023-08-10: qty 1

## 2023-08-10 MED ORDER — THIAMINE HCL 100 MG/ML IJ SOLN
500.0000 mg | Freq: Three times a day (TID) | INTRAVENOUS | Status: AC
  Administered 2023-08-10 – 2023-08-12 (×5): 500 mg via INTRAVENOUS
  Filled 2023-08-10 (×10): qty 5

## 2023-08-10 MED ORDER — PHENOBARBITAL SODIUM 130 MG/ML IJ SOLN: 130.0000 mg | Freq: Once | INTRAMUSCULAR | Status: DC

## 2023-08-10 MED ORDER — ONDANSETRON HCL 4 MG/2ML IJ SOLN: 4.0000 mg | Freq: Four times a day (QID) | INTRAMUSCULAR | Status: DC | PRN

## 2023-08-10 MED ORDER — CHLORDIAZEPOXIDE HCL 25 MG PO CAPS: 25.0000 mg | ORAL_CAPSULE | Freq: Every day | ORAL | Status: DC

## 2023-08-10 MED ORDER — LACTULOSE ENEMA
300.0000 mL | Freq: Once | ORAL | Status: AC
  Administered 2023-08-10: 300 mL via RECTAL
  Filled 2023-08-10: qty 300

## 2023-08-10 MED ORDER — THIAMINE HCL 100 MG/ML IJ SOLN
100.0000 mg | Freq: Every day | INTRAMUSCULAR | Status: AC
  Administered 2023-08-12 – 2023-08-16 (×5): 100 mg via INTRAVENOUS
  Filled 2023-08-10 (×5): qty 2

## 2023-08-10 MED ORDER — FOLIC ACID 1 MG PO TABS
1.0000 mg | ORAL_TABLET | Freq: Every day | ORAL | Status: DC
  Administered 2023-08-11 – 2023-08-14 (×4): 1 mg
  Filled 2023-08-10 (×4): qty 1

## 2023-08-10 MED ORDER — LORAZEPAM 2 MG/ML IJ SOLN
INTRAMUSCULAR | Status: AC
  Administered 2023-08-10: 2 mg via INTRAVENOUS
  Filled 2023-08-10: qty 1

## 2023-08-10 MED ORDER — ORAL CARE MOUTH RINSE
15.0000 mL | OROMUCOSAL | Status: DC | PRN
  Administered 2023-08-13: 15 mL via OROMUCOSAL

## 2023-08-10 MED ORDER — LORAZEPAM 2 MG/ML IJ SOLN
INTRAMUSCULAR | Status: AC
  Administered 2023-08-10: 1 mg via INTRAVENOUS
  Filled 2023-08-10: qty 1

## 2023-08-10 MED ORDER — CHLORDIAZEPOXIDE HCL 25 MG PO CAPS
25.0000 mg | ORAL_CAPSULE | Freq: Four times a day (QID) | ORAL | Status: DC
  Administered 2023-08-10: 25 mg
  Filled 2023-08-10: qty 1

## 2023-08-10 MED ORDER — HALOPERIDOL LACTATE 5 MG/ML IJ SOLN: 2.0000 mg | Freq: Four times a day (QID) | INTRAMUSCULAR | Status: DC | PRN

## 2023-08-10 MED ORDER — LORAZEPAM 2 MG/ML IJ SOLN: 2.0000 mg | Freq: Once | INTRAMUSCULAR | Status: AC

## 2023-08-10 MED ORDER — SENNOSIDES-DOCUSATE SODIUM 8.6-50 MG PO TABS: 1.0000 | ORAL_TABLET | Freq: Every evening | ORAL | Status: DC | PRN

## 2023-08-10 MED ORDER — ACETAMINOPHEN 325 MG PO TABS: 650.0000 mg | ORAL_TABLET | Freq: Four times a day (QID) | ORAL | Status: DC | PRN

## 2023-08-10 MED ORDER — SODIUM CHLORIDE 0.9 % IV SOLN: INTRAVENOUS | Status: DC

## 2023-08-10 MED ORDER — DEXMEDETOMIDINE HCL IN NACL 400 MCG/100ML IV SOLN
0.0000 ug/kg/h | INTRAVENOUS | Status: DC
  Administered 2023-08-10: 0.2 ug/kg/h via INTRAVENOUS
  Administered 2023-08-10: 1.3 ug/kg/h via INTRAVENOUS
  Administered 2023-08-11 (×2): 1.6 ug/kg/h via INTRAVENOUS
  Administered 2023-08-11: 1.5 ug/kg/h via INTRAVENOUS
  Administered 2023-08-11: 1.7 ug/kg/h via INTRAVENOUS
  Administered 2023-08-11: 1.3 ug/kg/h via INTRAVENOUS
  Administered 2023-08-11 (×2): 1.7 ug/kg/h via INTRAVENOUS
  Administered 2023-08-11: 1.6 ug/kg/h via INTRAVENOUS
  Administered 2023-08-11: 2 ug/kg/h via INTRAVENOUS
  Administered 2023-08-11: 1.3 ug/kg/h via INTRAVENOUS
  Administered 2023-08-11: 1.8 ug/kg/h via INTRAVENOUS
  Administered 2023-08-12: 2 ug/kg/h via INTRAVENOUS
  Administered 2023-08-12: 1.7 ug/kg/h via INTRAVENOUS
  Administered 2023-08-12: 1.8 ug/kg/h via INTRAVENOUS
  Administered 2023-08-12: 1.7 ug/kg/h via INTRAVENOUS
  Administered 2023-08-12: 2 ug/kg/h via INTRAVENOUS
  Administered 2023-08-12: 1.5 ug/kg/h via INTRAVENOUS
  Administered 2023-08-12 (×2): 1.6 ug/kg/h via INTRAVENOUS
  Administered 2023-08-12: 2 ug/kg/h via INTRAVENOUS
  Administered 2023-08-12: 1.7 ug/kg/h via INTRAVENOUS
  Administered 2023-08-12: 1.5 ug/kg/h via INTRAVENOUS
  Administered 2023-08-13: 1.6 ug/kg/h via INTRAVENOUS
  Administered 2023-08-13: 1.8 ug/kg/h via INTRAVENOUS
  Administered 2023-08-13: 2 ug/kg/h via INTRAVENOUS
  Administered 2023-08-13: 1.8 ug/kg/h via INTRAVENOUS
  Administered 2023-08-13 (×3): 1.2 ug/kg/h via INTRAVENOUS
  Administered 2023-08-13 – 2023-08-14 (×5): 1.8 ug/kg/h via INTRAVENOUS
  Administered 2023-08-14: 2 ug/kg/h via INTRAVENOUS
  Administered 2023-08-14: 1.7 ug/kg/h via INTRAVENOUS
  Administered 2023-08-14 (×2): 2 ug/kg/h via INTRAVENOUS
  Administered 2023-08-14: 1.9 ug/kg/h via INTRAVENOUS
  Administered 2023-08-14: 2 ug/kg/h via INTRAVENOUS
  Administered 2023-08-14: 1.7 ug/kg/h via INTRAVENOUS
  Administered 2023-08-14 (×4): 1.9 ug/kg/h via INTRAVENOUS
  Administered 2023-08-15: 1.5 ug/kg/h via INTRAVENOUS
  Administered 2023-08-15: 1.6 ug/kg/h via INTRAVENOUS
  Administered 2023-08-15: 1.9 ug/kg/h via INTRAVENOUS
  Administered 2023-08-15: 0.8 ug/kg/h via INTRAVENOUS
  Administered 2023-08-15: 1.9 ug/kg/h via INTRAVENOUS
  Administered 2023-08-15: 1.1 ug/kg/h via INTRAVENOUS
  Administered 2023-08-15: 1.9 ug/kg/h via INTRAVENOUS
  Administered 2023-08-15: 0.8 ug/kg/h via INTRAVENOUS
  Administered 2023-08-15: 1.8 ug/kg/h via INTRAVENOUS
  Administered 2023-08-15: 1.9 ug/kg/h via INTRAVENOUS
  Administered 2023-08-16: 0.6 ug/kg/h via INTRAVENOUS
  Filled 2023-08-10 (×5): qty 100
  Filled 2023-08-10: qty 200
  Filled 2023-08-10: qty 600
  Filled 2023-08-10 (×2): qty 100
  Filled 2023-08-10: qty 300
  Filled 2023-08-10 (×6): qty 100
  Filled 2023-08-10: qty 400
  Filled 2023-08-10: qty 100
  Filled 2023-08-10: qty 200
  Filled 2023-08-10 (×4): qty 100
  Filled 2023-08-10: qty 200
  Filled 2023-08-10 (×2): qty 100
  Filled 2023-08-10: qty 200
  Filled 2023-08-10 (×2): qty 100
  Filled 2023-08-10: qty 300
  Filled 2023-08-10 (×5): qty 100
  Filled 2023-08-10: qty 200
  Filled 2023-08-10: qty 100
  Filled 2023-08-10: qty 200

## 2023-08-10 MED ORDER — LORAZEPAM 1 MG PO TABS: 1.0000 mg | ORAL_TABLET | ORAL | Status: DC | PRN

## 2023-08-10 MED ORDER — ACETAMINOPHEN 650 MG RE SUPP: 650.0000 mg | Freq: Four times a day (QID) | RECTAL | Status: DC | PRN

## 2023-08-10 MED ORDER — LORAZEPAM 2 MG/ML IJ SOLN
1.0000 mg | Freq: Once | INTRAMUSCULAR | Status: AC
Start: 2023-08-10 — End: 2023-08-10

## 2023-08-10 MED ORDER — HALOPERIDOL LACTATE 5 MG/ML IJ SOLN
10.0000 mg | Freq: Four times a day (QID) | INTRAMUSCULAR | Status: DC | PRN
  Administered 2023-08-10 (×2): 10 mg via INTRAVENOUS
  Filled 2023-08-10 (×2): qty 2

## 2023-08-10 MED ORDER — LACTULOSE 10 GM/15ML PO SOLN
30.0000 g | Freq: Three times a day (TID) | ORAL | Status: DC
  Administered 2023-08-10 – 2023-08-12 (×5): 30 g
  Filled 2023-08-10 (×5): qty 45

## 2023-08-10 MED ORDER — LORAZEPAM 2 MG/ML IJ SOLN
1.0000 mg | INTRAMUSCULAR | Status: DC | PRN
  Administered 2023-08-10 – 2023-08-11 (×10): 4 mg via INTRAVENOUS
  Filled 2023-08-10 (×11): qty 2

## 2023-08-10 MED ORDER — ENOXAPARIN SODIUM 40 MG/0.4ML IJ SOSY
40.0000 mg | PREFILLED_SYRINGE | INTRAMUSCULAR | Status: DC
Start: 2023-08-10 — End: 2023-08-13
  Administered 2023-08-10 – 2023-08-12 (×3): 40 mg via SUBCUTANEOUS
  Filled 2023-08-10 (×3): qty 0.4

## 2023-08-10 NOTE — ED Notes (Signed)
PT is sleeping at this time. PT's left arm IV has been bleding but still flushes. Will keep an eye out on it to see if it needs to be removed.

## 2023-08-10 NOTE — ED Notes (Signed)
Wife Ninos Wildeman [phone] would like an update asap

## 2023-08-10 NOTE — ED Notes (Signed)
Rectal tube order placed, based on admin instructions for enema.

## 2023-08-10 NOTE — Discharge Instructions (Signed)

## 2023-08-10 NOTE — ED Triage Notes (Signed)
Patient has been here several times for OD.  Patient has been sober 7 weeks alcohol per wife but patient woke up confused off in a stare and not answering questions appropriately.  Wife suspects he has OD on his meds and possible alcohol.

## 2023-08-10 NOTE — Progress Notes (Addendum)
eLink Physician-Brief Progress Note Patient Name: Juan Clarke DOB: July 11, 1981 MRN: [id number]   Date of Service  08/10/2023  HPI/Events of Note  Patient with a history of heavy ETOH use admitted with agitated delirium  / altered mental status and found to have a serum ammonia of 165, consistent with hepatic encephalopathy, he likely is also withdrawing from alcohol given his agitated delirium, only other acknowledged drug use is Marijuana.  eICU Interventions  New Patient Evaluation, CIWA protocol, Lactulose, serial neuro-checks, Restraints + Sitter.        Thomasene Lot Wendy Hoback 08/10/2023, 8:57 PM

## 2023-08-10 NOTE — ED Notes (Signed)
PT is sleep but awakens for short periods to scream a bit and move around. Restraints are still in place, no visble damage.

## 2023-08-10 NOTE — ED Notes (Signed)
Rectal tube placed and clamped, and lactulose enema given.

## 2023-08-10 NOTE — ED Notes (Signed)
Dr. Kirke Corin advised of need for high dose ativan Q1 per CIWA order. He advised to continue monitoring and advise of any changes.

## 2023-08-10 NOTE — ED Notes (Addendum)
Messaged by Lidia Collum PA to place NG tube, informed him that it would be a min due to conditions in the ED at this time and PA informed me that it was not an emergency and that  the PT can have it placed when upstairs just notify receiving nurse to let him know when its placed so he can order KUB.

## 2023-08-10 NOTE — ED Notes (Signed)
ED TO INPATIENT HANDOFF REPORT  ED Nurse Name and Phone #: Rodney Booze [phone]  S Name/Age/Gender Juan Clarke 42 y.o. male Room/Bed: 035C/035C  Code Status   Code Status: Full Code  Home/SNF/Other Home Patient oriented to: self, place, time, and situation Is this baseline? Yes   Triage Complete: Triage complete  Chief Complaint Toxic metabolic encephalopathy [G92.8] Acute metabolic encephalopathy [G93.41]  Triage Note Patient has been here several times for OD.  Patient has been sober 7 weeks alcohol per wife but patient woke up confused off in a stare and not answering questions appropriately.  Wife suspects he has OD on his meds and possible alcohol.     Allergies Allergies  Allergen Reactions   Lexapro [Escitalopram Oxalate] Other (See Comments)    Lack of therapeutic effect    Zoloft [Sertraline Hcl] Other (See Comments)    Lack of therapeutic effect     Level of Care/Admitting Diagnosis ED Disposition     ED Disposition  Admit   Condition  --   Comment  Hospital Area: [facility] [[id number]]  Level of Care: ICU [6]  May admit patient to [facility] or [facility] if equivalent level of care is available:: Yes  Covid Evaluation: Asymptomatic - no recent exposure (last 10 days) testing not required  Diagnosis: Acute metabolic encephalopathy [4742595]  Admitting Physician: Lorin Glass [[id number]]  Attending Physician: Lorin Glass [[id number]]  Certification:: I certify this patient will need inpatient services for at least 2 midnights          B Medical/Surgery History Past Medical History:  Diagnosis Date   GAD (generalized anxiety disorder)    HTN (hypertension)    Insomnia    Migraine without aura    Overweight    Trigeminal neuralgia    Past Surgical History:  Procedure Laterality Date   Face surgery  11/14/14   TONSILLECTOMY     42 yr old     A IV Location/Drains/Wounds Patient Lines/Drains/Airways Status      Active Line/Drains/Airways     Name Placement date Placement time Site Days   Peripheral IV 08/10/23 18 G Left Antecubital 08/10/23  0744  Antecubital  less than 1   Peripheral IV 08/10/23 18 G 1.16" Anterior;Proximal;Right Forearm 08/10/23  0858  Forearm  less than 1   Flatus Tube/Pouch 08/10/23  1130  --  less than 1   External Urinary Catheter 08/10/23  0817  --  less than 1            Intake/Output Last 24 hours  Intake/Output Summary (Last 24 hours) at 08/10/2023 1735 Last data filed at 08/10/2023 1108 Gross per 24 hour  Intake --  Output 600 ml  Net -600 ml    Labs/Imaging Results for orders placed or performed during the hospital encounter of 08/10/23 (from the past 48 hour(s))  CBC     Status: None   Collection Time: 08/10/23  8:05 AM  Result Value Ref Range   WBC 4.4 4.0 - 10.5 K/uL   RBC 4.75 4.22 - 5.81 MIL/uL   Hemoglobin 15.6 13.0 - 17.0 g/dL   HCT 84.1 66.0 - 63.0 %   MCV 92.8 80.0 - 100.0 fL   MCH 32.8 26.0 - 34.0 pg   MCHC 35.4 30.0 - 36.0 g/dL   RDW 16.0 10.9 - 32.3 %   Platelets 187 150 - 400 K/uL   nRBC 0.0 0.0 - 0.2 %    Comment: Performed at [facility], [address], [place], [place] [address]  Comprehensive metabolic panel     Status: Abnormal   Collection Time: 08/10/23  8:05 AM  Result Value Ref Range   Sodium 135 135 - 145 mmol/L   Potassium 4.0 3.5 - 5.1 mmol/L   Chloride 103 98 - 111 mmol/L   CO2 17 (L) 22 - 32 mmol/L   Glucose, Bld 135 (H) 70 - 99 mg/dL    Comment: Glucose reference range applies only to samples taken after fasting for at least 8 hours.   BUN 10 6 - 20 mg/dL   Creatinine, Ser 8.65 0.61 - 1.24 mg/dL   Calcium 8.7 (L) 8.9 - 10.3 mg/dL   Total Protein 6.4 (L) 6.5 - 8.1 g/dL   Albumin 4.2 3.5 - 5.0 g/dL   AST 784 (H) 15 - 41 U/L   ALT 113 (H) 0 - 44 U/L   Alkaline Phosphatase 86 38 - 126 U/L   Total Bilirubin 1.2 (H) <1.2 mg/dL   GFR, Estimated >69 >62 mL/min    Comment: (NOTE) Calculated using the CKD-EPI  Creatinine Equation (2021)    Anion gap 15 5 - 15    Comment: Performed at [facility], [address] [place], [place] [address]  Ethanol     Status: Abnormal   Collection Time: 08/10/23  8:05 AM  Result Value Ref Range   Alcohol, Ethyl (B) 86 (H) <10 mg/dL    Comment: (NOTE) Lowest detectable limit for serum alcohol is 10 mg/dL.  For medical purposes only. Performed at [facility], [address], [place], [place] [address]   Ammonia     Status: Abnormal   Collection Time: 08/10/23  8:05 AM  Result Value Ref Range   Ammonia 165 (H) 9 - 35 umol/L    Comment: Performed at [facility], [address] [place], [place] [address]  I-Stat venous blood gas, ED     Status: Abnormal   Collection Time: 08/10/23  8:10 AM  Result Value Ref Range   pH, Ven 7.490 (H) 7.25 - 7.43   pCO2, Ven 28.0 (L) 44 - 60 mmHg   pO2, Ven 78 (H) 32 - 45 mmHg   Bicarbonate 21.3 20.0 - 28.0 mmol/L   TCO2 22 22 - 32 mmol/L   O2 Saturation 97 %   Acid-base deficit 1.0 0.0 - 2.0 mmol/L   Sodium 137 135 - 145 mmol/L   Potassium 3.9 3.5 - 5.1 mmol/L   Calcium, Ion 1.02 (L) 1.15 - 1.40 mmol/L   HCT 46.0 39.0 - 52.0 %   Hemoglobin 15.6 13.0 - 17.0 g/dL   Sample type VENOUS   Rapid urine drug screen (hospital performed)     Status: None   Collection Time: 08/10/23 11:05 AM  Result Value Ref Range   Opiates NONE DETECTED NONE DETECTED   Cocaine NONE DETECTED NONE DETECTED   Benzodiazepines NONE DETECTED NONE DETECTED   Amphetamines NONE DETECTED NONE DETECTED   Tetrahydrocannabinol NONE DETECTED NONE DETECTED   Barbiturates NONE DETECTED NONE DETECTED    Comment: (NOTE) DRUG SCREEN FOR MEDICAL PURPOSES ONLY.  IF CONFIRMATION IS NEEDED FOR ANY PURPOSE, NOTIFY LAB WITHIN 5 DAYS.  LOWEST DETECTABLE LIMITS FOR URINE DRUG SCREEN Drug Class                     Cutoff (ng/mL) Amphetamine and metabolites    1000 Barbiturate and metabolites    200  Benzodiazepine                  200 Opiates and metabolites        300 Cocaine and metabolites        300 THC                            50 Performed at [facility], [address], [place], [place] [address]   TSH     Status: Abnormal   Collection Time: 08/10/23  1:10 PM  Result Value Ref Range   TSH 8.579 (H) 0.350 - 4.500 uIU/mL    Comment: Performed by a 3rd Generation assay with a functional sensitivity of <=0.01 uIU/mL. Performed at [facility], [address], [place], [place] [address]   Magnesium     Status: None   Collection Time: 08/10/23  1:10 PM  Result Value Ref Range   Magnesium 2.0 1.7 - 2.4 mg/dL    Comment: Performed at [facility], [address] [place], [place] [address]  Phosphorus     Status: Abnormal   Collection Time: 08/10/23  1:10 PM  Result Value Ref Range   Phosphorus 6.2 (H) 2.5 - 4.6 mg/dL    Comment: Performed at [facility], [address], [place], [place] [address]  Acetaminophen level     Status: Abnormal   Collection Time: 08/10/23  1:10 PM  Result Value Ref Range   Acetaminophen (Tylenol), Serum <10 (L) 10 - 30 ug/mL    Comment: (NOTE) Therapeutic concentrations vary significantly. A range of 10-30 ug/mL  may be an effective concentration for many patients. However, some  are best treated at concentrations outside of this range. Acetaminophen concentrations >150 ug/mL at 4 hours after ingestion  and >50 ug/mL at 12 hours after ingestion are often associated with  toxic reactions.  Performed at [facility], [address] [place], [place] [address]   Protime-INR     Status: Abnormal   Collection Time: 08/10/23  1:10 PM  Result Value Ref Range   Prothrombin Time 16.8 (H) 11.4 - 15.2 seconds   INR 1.3 (H) 0.8 - 1.2    Comment: (NOTE) INR goal varies based on device and disease states. Performed at [facility], [address], [place], [place] [address]   Salicylate level     Status: Abnormal   Collection Time: 08/10/23   1:10 PM  Result Value Ref Range   Salicylate Lvl <7.0 (L) 7.0 - 30.0 mg/dL    Comment: Performed at [facility], [address], [place], [place] [address]  Lactic acid, plasma     Status: Abnormal   Collection Time: 08/10/23  1:10 PM  Result Value Ref Range   Lactic Acid, Venous 2.7 (HH) 0.5 - 1.9 mmol/L    Comment: CRITICAL RESULT CALLED TO, READ BACK BY AND VERIFIED WITH [facility] PARAMEDIC @1406  12.11.2024 E.AHMED Performed at [facility], [address], [place], [place] [address]    No results found.  Pending Labs Unresulted Labs (From admission, onward)     Start     Ordered   08/10/23 1634  T4, free  Add-on,   AD        08/10/23 1633   08/10/23 1301  Lactic acid, plasma  (Lactic Acid)  STAT Now then every 3 hours,   R (with STAT occurrences)  08/10/23 1301   08/10/23 0748  Blood gas, venous (at [facility] and [facility])  Once,   R        08/10/23 0747            Vitals/Pain Today's Vitals   08/10/23 1455 08/10/23 1600 08/10/23 1715 08/10/23 1730  BP: (!) 168/100 (!) 162/127 (!) 156/102 (!) 172/89  Pulse: (!) 136 (!) 141 (!) 135 (!) 134  Resp:  18 (!) 26 (!) 30  Temp:      TempSrc:      SpO2: 92% 93% 93% 92%  Weight:      Height:      PainSc:        Isolation Precautions No active isolations  Medications Medications  haloperidol lactate (HALDOL) injection 10 mg (10 mg Intravenous Given 08/10/23 1116)  enoxaparin (LOVENOX) injection 40 mg (40 mg Subcutaneous Given 08/10/23 1301)  acetaminophen (TYLENOL) tablet 650 mg (has no administration in time range)    Or  acetaminophen (TYLENOL) suppository 650 mg (has no administration in time range)  senna-docusate (Senokot-S) tablet 1 tablet (has no administration in time range)  ondansetron (ZOFRAN) tablet 4 mg (has no administration in time range)    Or  ondansetron (ZOFRAN) injection 4 mg (has no administration in time range)  LORazepam (ATIVAN) tablet 1-4 mg ( Oral See Alternative 08/10/23 1456)     Or  LORazepam (ATIVAN) injection 1-4 mg (4 mg Intravenous Given 08/10/23 1456)  multivitamin with minerals tablet 1 tablet (0 tablets Oral Hold 08/10/23 1259)  0.9 %  sodium chloride infusion ( Intravenous New Bag/Given 08/10/23 1317)  dexmedetomidine (PRECEDEX) 400 MCG/100ML (4 mcg/mL) infusion (has no administration in time range)  chlordiazePOXIDE (LIBRIUM) capsule 25 mg (has no administration in time range)    Followed by  chlordiazePOXIDE (LIBRIUM) capsule 25 mg (has no administration in time range)    Followed by  chlordiazePOXIDE (LIBRIUM) capsule 25 mg (has no administration in time range)    Followed by  chlordiazePOXIDE (LIBRIUM) capsule 25 mg (has no administration in time range)  lactulose (CHRONULAC) 10 GM/15ML solution 30 g (has no administration in time range)  folic acid (FOLVITE) tablet 1 mg (has no administration in time range)  thiamine (VITAMIN B1) 500 mg in sodium chloride 0.9 % 50 mL IVPB (has no administration in time range)  thiamine (VITAMIN B1) injection 100 mg (has no administration in time range)  thiamine (VITAMIN B1) injection 100 mg (has no administration in time range)  LORazepam (ATIVAN) injection 1 mg (1 mg Intravenous Given 08/10/23 0749)  sodium chloride 0.9 % bolus 1,000 mL (0 mLs Intravenous Stopped 08/10/23 0858)  LORazepam (ATIVAN) injection 2 mg (2 mg Intravenous Given 08/10/23 0824)  LORazepam (ATIVAN) injection 2 mg (2 mg Intravenous Given 08/10/23 0838)  lactulose (CHRONULAC) enema 200 gm (300 mLs Rectal Given 08/10/23 1132)  haloperidol lactate (HALDOL) injection 10 mg (10 mg Intravenous Given 08/10/23 0935)    Mobility walks     Focused Assessments Cardiac Assessment Handoff:  Cardiac Rhythm: Sinus tachycardia No results found for: "CKTOTAL", "CKMB", "CKMBINDEX", "TROPONINI" No results found for: "DDIMER" Does the Patient currently have chest pain? No   , Neuro Assessment Handoff:  Swallow screen pass? PT is slleping Cardiac  Rhythm: Sinus tachycardia       Neuro Assessment: Exceptions to [facility] Neuro Checks:      Has TPA been given? No If patient is a Neuro Trauma and patient is going to OR before floor call report to [employer]  nurse: [phone] or [phone]   R Recommendations: See Admitting Provider Note  Report given to:   Additional Notes:

## 2023-08-10 NOTE — ED Provider Notes (Signed)
[place] EMERGENCY DEPARTMENT AT [facility] Provider Note   CSN: [id number] Arrival date & time: 08/10/23  8295     History  Chief Complaint  Patient presents with   Drug Overdose    Juan Clarke is a 42 y.o. male.  HPI 42 year old male history of polysubstance use, alcohol abuse, presents today with reports of altered mental status. History received from EMS via RN.  They report patient has been seen several times for overdose.  Wife reported he had been sober for 7 weeks but woke up to find him confused and staring not answering questions appropriately.  Wife suspects he had overdosed on his medications and possible alcohol.  Nursing reports odor of alcohol about him   Wife states he just appeared "out of it" this am.  Last night  Not feeling well past two days and saw pmd and had zofran.  Feeling nauseated but not vomiting.  Some diarrhea.  Wife states he may have been drinking.  She states he has accidentally taken too many meds in past.  She noted that he was restless last night.  Home Medications Prior to Admission medications   Medication Sig Start Date End Date Taking? Authorizing Provider  acamprosate (CAMPRAL) 333 MG tablet Take 2 tablets by mouth 3 (three) times daily.    [provider]  allopurinol (ZYLOPRIM) 300 MG tablet Take 300 mg by mouth daily.    [provider]  atorvastatin (LIPITOR) 10 MG tablet Take 10 mg by mouth daily. 03/08/19   [provider]  clonazePAM (KLONOPIN) 0.5 MG tablet Take 0.5 mg by mouth 2 (two) times daily as needed for anxiety.  06/07/14   [provider]  DULoxetine (CYMBALTA) 60 MG capsule Take 60 mg by mouth daily. 07/27/23   [provider]  levothyroxine (SYNTHROID) 50 MCG tablet Take 50 mcg by mouth daily before breakfast. 01/03/19   [provider]  lisdexamfetamine (VYVANSE) 30 MG capsule Take 30 mg by mouth daily.    [provider]   lisinopril-hydrochlorothiazide (ZESTORETIC) 20-25 MG tablet Take 1 tablet by mouth daily. 12/15/18   [provider]  meclizine (ANTIVERT) 12.5 MG tablet Take 1 tablet (12.5 mg total) by mouth 3 (three) times daily as needed for dizziness. Patient not taking: Reported on 11/28/2022 03/13/19   Palumbo, April, MD  meloxicam (MOBIC) 15 MG tablet Take 15 mg by mouth daily as needed for pain.    [provider]  ondansetron (ZOFRAN ODT) 8 MG disintegrating tablet 8mg  ODT q8 hours prn nausea Patient taking differently: Take 8 mg by mouth every 8 (eight) hours as needed for nausea. 03/13/19   Palumbo, April, MD  PARoxetine (PAXIL) 20 MG tablet Take 20 mg by mouth daily. 02/10/19   [provider]      Allergies    Lexapro [escitalopram oxalate] and Zoloft [sertraline hcl]    Review of Systems   Review of Systems  Physical Exam Updated Vital Signs BP 132/86   Pulse (!) 114   Temp 98.2 F (36.8 C) (Oral)   Resp (!) 21   Ht 1.778 m (5\' 10" )   Wt 116.1 kg   SpO2 93%   BMI 36.73 kg/m  Physical Exam Vitals reviewed.  Constitutional:      General: He is in acute distress.     Appearance: He is obese. He is ill-appearing.  HENT:     Head: Normocephalic.     Right Ear: External ear normal.  Left Ear: External ear normal.     Nose: Nose normal.     Mouth/Throat:     Pharynx: Oropharynx is clear.  Eyes:     Extraocular Movements: Extraocular movements intact.     Pupils: Pupils are equal, round, and reactive to light.     Comments: Patient peers to have some left sided gaze preference  Cardiovascular:     Rate and Rhythm: Regular rhythm. Tachycardia present.     Pulses: Normal pulses.  Pulmonary:     Effort: Pulmonary effort is normal.     Breath sounds: Normal breath sounds.  Abdominal:     Palpations: Abdomen is soft.  Musculoskeletal:        General: Normal range of motion.     Cervical back: Normal range of motion.  Skin:    Comments: Patient is  diaphoretic  Neurological:     General: No focal deficit present.     Comments: Patient is not answering questions.  He appears to be gazing into space.  He has some random eye movements. He moves all 4 extremities equally and well     ED Results / Procedures / Treatments   Labs (all labs ordered are listed, but only abnormal results are displayed) Labs Reviewed  COMPREHENSIVE METABOLIC PANEL - Abnormal; Notable for the following components:      Result Value   CO2 17 (*)    Glucose, Bld 135 (*)    Calcium 8.7 (*)    Total Protein 6.4 (*)    AST 167 (*)    ALT 113 (*)    Total Bilirubin 1.2 (*)    All other components within normal limits  ETHANOL - Abnormal; Notable for the following components:   Alcohol, Ethyl (B) 86 (*)    All other components within normal limits  AMMONIA - Abnormal; Notable for the following components:   Ammonia 165 (*)    All other components within normal limits  I-STAT VENOUS BLOOD GAS, ED - Abnormal; Notable for the following components:   pH, Ven 7.490 (*)    pCO2, Ven 28.0 (*)    pO2, Ven 78 (*)    Calcium, Ion 1.02 (*)    All other components within normal limits  CBC  BLOOD GAS, VENOUS  RAPID URINE DRUG SCREEN, HOSP PERFORMED    EKG None  Radiology No results found.  Procedures .Critical Care  Performed by: Margarita Grizzle, MD Authorized by: Margarita Grizzle, MD   Critical care provider statement:    Critical care time (minutes):  65   Critical care was necessary to treat or prevent imminent or life-threatening deterioration of the following conditions:  CNS failure or compromise and toxidrome   Critical care was time spent personally by me on the following activities:  Development of treatment plan with patient or surrogate, discussions with consultants, evaluation of patient's response to treatment, examination of patient, ordering and review of laboratory studies, ordering and review of radiographic studies, ordering and performing  treatments and interventions, pulse oximetry, re-evaluation of patient's condition and review of old charts     Medications Ordered in ED Medications  lactulose (CHRONULAC) enema 200 gm (has no administration in time range)  haloperidol lactate (HALDOL) injection 10 mg (has no administration in time range)  LORazepam (ATIVAN) injection 1 mg (1 mg Intravenous Given 08/10/23 0749)  sodium chloride 0.9 % bolus 1,000 mL (0 mLs Intravenous Stopped 08/10/23 0858)  LORazepam (ATIVAN) injection 2 mg (2 mg Intravenous Given 08/10/23 0824)  LORazepam (ATIVAN) injection 2 mg (2 mg Intravenous Given 08/10/23 0838)  haloperidol lactate (HALDOL) injection 10 mg (10 mg Intravenous Given 08/10/23 0935)    ED Course/ Medical Decision Making/ A&P Clinical Course as of 08/10/23 1100  Wed Aug 10, 2023  0905 Ammonia is significantly elevated at 165 Alcohol is elevated at 86 VBG pH 7.49 pCO2 of 28 pO2 of 78 Complete metabolic panel significant for normal sodium potassium chloride Glucose 135 CO2 is decreased at 17 [DR]    Clinical Course User Index [DR] Margarita Grizzle, MD                                 Medical Decision Making Amount and/or Complexity of Data Reviewed Labs: ordered. Radiology: ordered.  Risk Prescription drug management.   42 yo male with ams and agitation.  Differential diagnosis is broad and includes but is not limited to hypoglycemia, other electrolyte abnormalities, trauma, acute intoxication, liver failure with elevated ammonia, infection and encephalopathy Ho polysubstance abuse and probable etoh use Here evaluated with labs-  Elevated ammonia at 165 likely major driver of ams Etoh elevated at 86- doubt acute alcohol intoxication but does give concern for alcohol withdrawal Urine drug screen still pending Patient received Ativan in 1 to 2 mg aliquots to total of 5 mg and is currently in restraints with some decreased agitation. Consulted with pharmacy regarding second  line agents and advised to start with Haldol.  Haldol 10 mg IV is ordered Patient to receive lactulose enema as patient unable to take p.o. at this time IV fluids are ensuing patient has received 1 L  Consult placed critical care-please see note- advise does not need critical care admission Patient calmed after ativan and haldol 1-altered mental status 2 acute liver failure 3 ho etoh abuse- possible withdrawal 4- consider other toxins including recretional drugs and rx medications- wife did not note any signs of drug use. Patient resting.  Gag intact Tachycardia noted BP 132/86 Sats 93-94% on room air  Discussed with Dr. Benjamine Mola for admission       Final Clinical Impression(s) / ED Diagnoses Final diagnoses:  Altered mental status, unspecified altered mental status type  Acute liver failure without hepatic coma    Rx / DC Orders ED Discharge Orders     None         Margarita Grizzle, MD 08/10/23 1100

## 2023-08-10 NOTE — ED Notes (Addendum)
Wife Brayton Caves contacted, who advised she would come up here for help with history

## 2023-08-10 NOTE — Progress Notes (Signed)
 CSW received consult for patient for substance abuse resources - resources were added to AVS.  Edwin Dada, MSW, LCSW Transitions of Care  Clinical Social Worker II [phone]

## 2023-08-10 NOTE — Consult Note (Signed)
NAME:  Juan Clarke, MRN:  [id number], DOB:  1981-03-11, LOS: 0 ADMISSION DATE:  08/10/2023, CONSULTATION DATE: 08/10/2023 REFERRING MD: Emergency department physician, CHIEF COMPLAINT: Agitation alcohol withdrawal  History of Present Illness:  Juan Clarke is is a 42 year old male reportedly has not had a drink in 3 weeks this will argatroban intoxicated.  He was transported to the emergency department for agitated state.  Toxicology screen is pending.  He is in restraints at this time.  Hemodynamically stable.  Haldol 10 mg IV was given clicker hospitalist consults.  At this time it does not appear he needs intensive care at this time.  If he should worsen pulmonary critical care will be available for admission to the intensive care unit.  1 years  Pertinent  Medical History   Past Medical History:  Diagnosis Date   GAD (generalized anxiety disorder)    HTN (hypertension)    Insomnia    Migraine without aura    Overweight    Trigeminal neuralgia    (Z  Significant Hospital Events: Including procedures, antibiotic start and stop dates in addition to other pertinent events     Interim History / Subjective:  .  Transportation now controlled tolerable  Objective   Blood pressure (!) 154/99, pulse (!) 140, temperature 98.2 F (36.8 C), temperature source Oral, resp. rate (!) 25, height 5\' 10"  (1.778 m), weight 116.1 kg, SpO2 96%.       No intake or output data in the 24 hours ending 08/10/23 1002 Filed Weights   08/10/23 0734  Weight: 116.1 kg    Examination: General: Obese male who is currently asleep and difficult to arouse HENT: No acute diagnoses Lungs: Diminished in the bases Cardiovascular: Heart sounds are regular regular rate and rhythm Abdomen: Soft nontender Extremities: Warm dry mild edema Neuro: Currently sedated with Haldol GU: Voids:  Resolved Hospital Problem list   DM  Assessment & Plan:  Altered mental status in the setting of approximately 24  hours.  Alcohol consumption possible drug consumption agitation requiring restraints and sedation.  Currently has been treated with 2 rounds of Haldol and sleepy.  Has had multiple rounds of Ativan which seemed to help. Continue Haldol 2 to 5 mg every 3 hours as needed for severe agitation Continue CIWA protocol with Ativan Thiamine and folic acid Restraints as needed Does not appear you need intensive care unit at this time but should he worsen he may.  Liver disease with elevated ammonia 64 Lactulose enema has been ordered  Hypertension Well-controlled at this time  Depression and anxiety On pharmaceutical regimen  Hypothyroidism On Synthroid  Best Practice (right click and "Reselect all SmartList Selections" daily)   Diet/type: Regular consistency (see orders) DVT prophylaxis prophylactic heparin  Pressure ulcer(s): N/A GI prophylaxis: PPI Lines: N/A Foley:  N/A Code Status:  full code Last date of multidisciplinary goals of care discussion [to be determined]  Labs   CBC: Recent Labs  Lab 08/10/23 0805 08/10/23 0810  WBC 4.4  --   HGB 15.6 15.6  HCT 44.1 46.0  MCV 92.8  --   PLT 187  --     Basic Metabolic Panel: Recent Labs  Lab 08/10/23 0805 08/10/23 0810  NA 135 137  K 4.0 3.9  CL 103  --   CO2 17*  --   GLUCOSE 135*  --   BUN 10  --   CREATININE 0.87  --   CALCIUM 8.7*  --    GFR: Estimated Creatinine  Clearance: 144 mL/min (by C-G formula based on SCr of 0.87 mg/dL). Recent Labs  Lab 08/10/23 0805  WBC 4.4    Liver Function Tests: Recent Labs  Lab 08/10/23 0805  AST 167*  ALT 113*  ALKPHOS 86  BILITOT 1.2*  PROT 6.4*  ALBUMIN 4.2   No results for input(s): "LIPASE", "AMYLASE" in the last 168 hours. Recent Labs  Lab 08/10/23 0805  AMMONIA 165*    ABG    Component Value Date/Time   HCO3 21.3 08/10/2023 0810   TCO2 22 08/10/2023 0810   ACIDBASEDEF 1.0 08/10/2023 0810   O2SAT 97 08/10/2023 0810     Coagulation Profile: No  results for input(s): "INR", "PROTIME" in the last 168 hours.  Cardiac Enzymes: No results for input(s): "CKTOTAL", "CKMB", "CKMBINDEX", "TROPONINI" in the last 168 hours.  HbA1C: No results found for: "HGBA1C"  CBG: No results for input(s): "GLUCAP" in the last 168 hours.  Review of Systems:   na  Past Medical History:  He,  has a past medical history of GAD (generalized anxiety disorder), HTN (hypertension), Insomnia, Migraine without aura, Overweight, and Trigeminal neuralgia.   Surgical History:   Past Surgical History:  Procedure Laterality Date   Face surgery  11/14/14   TONSILLECTOMY     42 yr old     Social History:   reports that he has never smoked. He has never used smokeless tobacco. He reports current alcohol use of about 42.0 standard drinks of alcohol per week. He reports current drug use. Frequency: 7.00 times per week. Drug: Marijuana.   Family History:  His family history includes Brain cancer in his maternal grandmother; Drug abuse in his sister; Heart disease in his paternal grandfather; Hypertension in his father; Mental illness in his sister; Migraines in his mother.   Allergies Allergies  Allergen Reactions   Lexapro [Escitalopram Oxalate] Other (See Comments)    Lack of therapeutic effect    Zoloft [Sertraline Hcl] Other (See Comments)    Lack of therapeutic effect      Home Medications  Prior to Admission medications   Medication Sig Start Date End Date Taking? Authorizing Provider  acamprosate (CAMPRAL) 333 MG tablet Take 2 tablets by mouth 3 (three) times daily.    [provider]  allopurinol (ZYLOPRIM) 300 MG tablet Take 300 mg by mouth daily.    [provider]  atorvastatin (LIPITOR) 10 MG tablet Take 10 mg by mouth daily. 03/08/19   [provider]  clonazePAM (KLONOPIN) 0.5 MG tablet Take 0.5 mg by mouth 2 (two) times daily as needed for anxiety.  06/07/14   [provider]  DULoxetine (CYMBALTA) 60 MG  capsule Take 60 mg by mouth daily. 07/27/23   [provider]  levothyroxine (SYNTHROID) 50 MCG tablet Take 50 mcg by mouth daily before breakfast. 01/03/19   [provider]  lisdexamfetamine (VYVANSE) 30 MG capsule Take 30 mg by mouth daily.    [provider]  lisinopril-hydrochlorothiazide (ZESTORETIC) 20-25 MG tablet Take 1 tablet by mouth daily. 12/15/18   [provider]  meclizine (ANTIVERT) 12.5 MG tablet Take 1 tablet (12.5 mg total) by mouth 3 (three) times daily as needed for dizziness. Patient not taking: Reported on 11/28/2022 03/13/19   Palumbo, April, MD  meloxicam (MOBIC) 15 MG tablet Take 15 mg by mouth daily as needed for pain.    [provider]  ondansetron (ZOFRAN ODT) 8 MG disintegrating tablet 8mg  ODT q8 hours prn nausea Patient taking differently:  Take 8 mg by mouth every 8 (eight) hours as needed for nausea. 03/13/19   Palumbo, April, MD  PARoxetine (PAXIL) 20 MG tablet Take 20 mg by mouth daily. 02/10/19   [provider]     Critical care time: 75     Steve Jabri Blancett ACNP Acute Care Nurse Practitioner [facility]/Critical Care Please consult Amion 08/10/2023, 10:02 AM

## 2023-08-10 NOTE — Progress Notes (Signed)
08/10/2023 Asked to re-eval Protecting airway but needs lots of PRNs He won't track but follows commands Can do some precedex along with placing NGT Start high dose IV thiamine Would start lactulose and librium per tube (phenobarb a little tricky with ALI). 4 point restraints and sitter. Fine for ICU status for tonight, discussed with Dr. Burnett Sheng, if better by AM [facility] can resume primary, if still needs precedex and multiple PRNs PCCM can take over.  Myrla Halsted MD PCCM

## 2023-08-10 NOTE — ED Notes (Signed)
Rectal tube ordered for enema

## 2023-08-10 NOTE — ED Notes (Addendum)
PO meds held due to patient's [facility]

## 2023-08-10 NOTE — H&P (Signed)
History and Physical    Patient: Juan Clarke WUX:[id number] DOB: 01-15-1981 DOA: 08/10/2023 DOS: the patient was seen and examined on 08/10/2023 PCP: Sigmund Hazel, MD  Patient coming from: Home  Chief Complaint:  Chief Complaint  Patient presents with   Altered Mental Status   Alcohol Problem   HPI: Juan Clarke is a 43 y.o. male with medical history significant of anxiety, polysubstance use, alcohol use disorder, HTN and migraine who presented to the ED with altered mental status.  Patient is somnolent and altered during my evaluation. Per spouse, patient was agitated last night and occasionally woke up and walked to the door looking confused. This morning, patient did not wake up at the time he usually wakes up.  She attempted to wake patient up and patient was not responding to her.  He looked confused and did not remember her name so she called 911. States patient was out of work the past 2 days due to not feeling well. She initially thought patient was restless due to his GI symptoms which included some nausea and stomach pain. She also states patient told her on Monday that he has been sober for 7 weeks. States patient likely hid his drinking from her as she did not see patient drinking at home.  ED course: Initial vitals with temp 98.2, RR 20, HR 109, BP 156/109, SpO2 97% on room air. Labs showed WBC 4.4, Hgb 15.6, platelet 187, sodium 135, K+ 4.0, bicarb 17, glucose 135, creatinine 0.87, AST/ALT 167/113, bilirubin 1.2, alk phos 86, ethanol 86, ammonia 165, VBG showed pH 7.49, pCO2 28, pO2 78, bicarb 21.3, negative UDS.  EKG shows sinus tach Patient was found to be agitated on arrival requiring multiple doses of IV Ativan and Haldol. CCM was consulted for evaluation and recommended medicine admit Patient receive IV NS 1 L bolus and given lactulose enema x 1 TRH was consulted for admission  Review of Systems: unable to review all systems due to the inability of the patient to  answer questions. Past Medical History:  Diagnosis Date   GAD (generalized anxiety disorder)    HTN (hypertension)    Insomnia    Migraine without aura    Overweight    Trigeminal neuralgia    Past Surgical History:  Procedure Laterality Date   Face surgery  11/14/14   TONSILLECTOMY     42 yr old   Social History:  reports that he has never smoked. He has never used smokeless tobacco. He reports current alcohol use of about 42.0 standard drinks of alcohol per week. He reports current drug use. Frequency: 7.00 times per week. Drug: Marijuana.  Allergies  Allergen Reactions   Lexapro [Escitalopram Oxalate] Other (See Comments)    Lack of therapeutic effect    Zoloft [Sertraline Hcl] Other (See Comments)    Lack of therapeutic effect     Family History  Problem Relation Age of Onset   Migraines Mother    Hypertension Father    Mental illness Sister    Drug abuse Sister    Brain cancer Maternal Grandmother    Heart disease Paternal Grandfather     Prior to Admission medications   Medication Sig Start Date End Date Taking? Authorizing Provider  acamprosate (CAMPRAL) 333 MG tablet Take 2 tablets by mouth 3 (three) times daily.    [provider]  allopurinol (ZYLOPRIM) 300 MG tablet Take 300 mg by mouth daily.    [provider]  atorvastatin (LIPITOR) 10 MG  tablet Take 10 mg by mouth daily. 03/08/19   [provider]  clonazePAM (KLONOPIN) 0.5 MG tablet Take 0.5 mg by mouth 2 (two) times daily as needed for anxiety.  06/07/14   [provider]  DULoxetine (CYMBALTA) 60 MG capsule Take 60 mg by mouth daily. 07/27/23   [provider]  levothyroxine (SYNTHROID) 50 MCG tablet Take 50 mcg by mouth daily before breakfast. 01/03/19   [provider]  lisdexamfetamine (VYVANSE) 30 MG capsule Take 30 mg by mouth daily.    [provider]  lisinopril-hydrochlorothiazide (ZESTORETIC) 20-25 MG tablet Take 1 tablet by mouth daily.  12/15/18   [provider]  meclizine (ANTIVERT) 12.5 MG tablet Take 1 tablet (12.5 mg total) by mouth 3 (three) times daily as needed for dizziness. Patient not taking: Reported on 11/28/2022 03/13/19   Palumbo, April, MD  meloxicam (MOBIC) 15 MG tablet Take 15 mg by mouth daily as needed for pain.    [provider]  ondansetron (ZOFRAN ODT) 8 MG disintegrating tablet 8mg  ODT q8 hours prn nausea Patient taking differently: Take 8 mg by mouth every 8 (eight) hours as needed for nausea. 03/13/19   Palumbo, April, MD  PARoxetine (PAXIL) 20 MG tablet Take 20 mg by mouth daily. 02/10/19   [provider]    Physical Exam: Vitals:   08/10/23 1330 08/10/23 1417 08/10/23 1420 08/10/23 1430  BP: (!) 175/126  (!) 172/136 (!) 164/115  Pulse: (!) 141 (!) 130 (!) 129 (!) 134  Resp: 12  (!) 26 (!) 22  Temp:      TempSrc:      SpO2: 97%  96% 94%  Weight:      Height:       General: Acutely sick appearing delayed man laying in bed with 4 point restraints. Intermittently agitated but mostly somnolent HEENT: Xenia/AT. Anicteric sclera. Pupils sluggish to light. Dry mucous membrane. CV: Tachycardia. Regular rhythm. No murmurs, rubs, or gallops. No LE edema Pulmonary: Lungs CTAB. Normal effort. No wheezing or rales. Abdominal: Soft, nondistended. Mild generalized ttp. Normal bowel sounds. Extremities: Palpable radial and DP pulses. Normal ROM. Skin: Warm and dry. No obvious rash or lesions. Neuro: Somnolent with occasional agitation. Not oriented to person place or time. Moves all extremities. No focal deficit. Psych: Normal mood and affect  Data Reviewed: Labs showed WBC 4.4, Hgb 15.6, platelet 187, sodium 135, K+ 4.0, bicarb 17, glucose 135, creatinine 0.87, AST/ALT 167/113, bilirubin 1.2, alk phos 86, ethanol 86, ammonia 165, VBG showed pH 7.49, pCO2 28, pO2 78, bicarb 21.3, negative UDS.  EKG shows sinus tach Ordered labs shows TSH 8.58, mag 2.0, Phos 6.2, normal acetaminophen  levels, normal salicylate levels, PT/INR 16.8/1.3, lactic acid 2.7   Assessment and Plan: Juan Clarke is a 42 y.o. male with medical history significant of anxiety, polysubstance use, alcohol use disorder, HTN and migraine who presented to the ED with altered mental status admitted for acute toxic metabolic encephalopathy.  # Acute toxic metabolic encephalopathy Patient with history of EtOH abuse and likely cirrhosis presented with altered mental status. Found to be significantly agitated with labs showing evidence of hyperammonemia, elevated EtOH levels and elevated LFTs. No evidence of acute infection. His presentation likely combination of hepatic encephalopathy and early EtOH withdrawal. -PCCM consulted, following -As needed Haldol for agitation -Continue IV hydration -Trend and replete electrolytes -Low threshold to transfer to ICU  # EtOH withdrawal Patient with history of significant EtOH abuse presented with altered mental status.  A tunnel level elevated to 86 but normal during last ED visit last month.  Likely early EtOH withdrawal.  Per spouse, patient informed her he has been sober for 7 weeks so unclear how much patient has been drinking.  Last CIWA significant elevated to 23->29 -CIWA with Ativan -MVI, folate and thiamine -Consider ICU admission for close monitoring  # Hepatic encephalopathy History of significant EtOH abuse. Last abdominal ultrasound in 2019 showed diffusely hyperechoic liver compatible with hepatocellular disease. PT/INR elevated to 16.8/1.3. Ammonia elevated to 165. S/p lactulose enema in the ED x1.  Patient remains altered with intermittent agitation. -Rectal lactulose enema 300 mL every 8 hours -Rifaximin if no improvement mental status in 48 hours -Follow-up RUQ ultrasound -Trend ammonia level -Consider GI follow-up in the outpatient  # HTN BP elevated with SBP in the 130s to 160s in the setting of EtOH withdrawal. -Resume home BP meds when at  baseline mental status  # Hypothyroidism Repeat TSH elevated to 8.58 -Follow-up free T4 -Resume home Synthroid when more awake  # Anxiety History of generalized anxiety disorder on as needed Klonopin. -Resume meds when more awake   Advance Care Planning:   Code Status: Full Code   Consults: PCCM  Family Communication: Discussed admission with spouse at bedside  Severity of Illness: The appropriate patient status for this patient is INPATIENT. Inpatient status is judged to be reasonable and necessary in order to provide the required intensity of service to ensure the patient's safety. The patient's presenting symptoms, physical exam findings, and initial radiographic and laboratory data in the context of their chronic comorbidities is felt to place them at high risk for further clinical deterioration. Furthermore, it is not anticipated that the patient will be medically stable for discharge from the hospital within 2 midnights of admission.   * I certify that at the point of admission it is my clinical judgment that the patient will require inpatient hospital care spanning beyond 2 midnights from the point of admission due to high intensity of service, high risk for further deterioration and high frequency of surveillance required.*  Author: Steffanie Rainwater, MD 08/10/2023 2:46 PM  For on call review [email or web address].

## 2023-08-11 ENCOUNTER — Inpatient Hospital Stay (HOSPITAL_COMMUNITY): Payer: BC Managed Care – PPO

## 2023-08-11 DIAGNOSIS — K7200 Acute and subacute hepatic failure without coma: Secondary | ICD-10-CM

## 2023-08-11 DIAGNOSIS — K7682 Hepatic encephalopathy: Secondary | ICD-10-CM

## 2023-08-11 DIAGNOSIS — N179 Acute kidney failure, unspecified: Secondary | ICD-10-CM

## 2023-08-11 DIAGNOSIS — F10931 Alcohol use, unspecified with withdrawal delirium: Secondary | ICD-10-CM

## 2023-08-11 DIAGNOSIS — I10 Essential (primary) hypertension: Secondary | ICD-10-CM | POA: Diagnosis not present

## 2023-08-11 DIAGNOSIS — G928 Other toxic encephalopathy: Secondary | ICD-10-CM | POA: Diagnosis not present

## 2023-08-11 LAB — URINALYSIS, ROUTINE W REFLEX MICROSCOPIC
Bilirubin Urine: NEGATIVE
Glucose, UA: NEGATIVE mg/dL
Ketones, ur: NEGATIVE mg/dL
Leukocytes,Ua: NEGATIVE
Nitrite: NEGATIVE
Protein, ur: 30 mg/dL — AB
Specific Gravity, Urine: 1.027 (ref 1.005–1.030)
pH: 5 (ref 5.0–8.0)

## 2023-08-11 LAB — COMPREHENSIVE METABOLIC PANEL
ALT: 83 U/L — ABNORMAL HIGH (ref 0–44)
AST: 135 U/L — ABNORMAL HIGH (ref 15–41)
Albumin: 4 g/dL (ref 3.5–5.0)
Alkaline Phosphatase: 79 U/L (ref 38–126)
Anion gap: 14 (ref 5–15)
BUN: 15 mg/dL (ref 6–20)
CO2: 15 mmol/L — ABNORMAL LOW (ref 22–32)
Calcium: 9 mg/dL (ref 8.9–10.3)
Chloride: 114 mmol/L — ABNORMAL HIGH (ref 98–111)
Creatinine, Ser: 1.16 mg/dL (ref 0.61–1.24)
GFR, Estimated: >60 mL/min (ref >60)
Glucose, Bld: 139 mg/dL — ABNORMAL HIGH (ref 70–99)
Potassium: 3.3 mmol/L — ABNORMAL LOW (ref 3.5–5.1)
Sodium: 143 mmol/L (ref 135–145)
Total Bilirubin: 1.3 mg/dL — ABNORMAL HIGH (ref <1.2)
Total Protein: 6.2 g/dL — ABNORMAL LOW (ref 6.5–8.1)

## 2023-08-11 LAB — GLUCOSE, CAPILLARY
Glucose-Capillary: 130 mg/dL — ABNORMAL HIGH (ref 70–99)
Glucose-Capillary: 133 mg/dL — ABNORMAL HIGH (ref 70–99)
Glucose-Capillary: 141 mg/dL — ABNORMAL HIGH (ref 70–99)
Glucose-Capillary: 146 mg/dL — ABNORMAL HIGH (ref 70–99)
Glucose-Capillary: 149 mg/dL — ABNORMAL HIGH (ref 70–99)
Glucose-Capillary: 150 mg/dL — ABNORMAL HIGH (ref 70–99)
Glucose-Capillary: 154 mg/dL — ABNORMAL HIGH (ref 70–99)

## 2023-08-11 LAB — LACTIC ACID, PLASMA: Lactic Acid, Venous: 2 mmol/L (ref 0.5–1.9)

## 2023-08-11 MED ORDER — LACTATED RINGERS IV SOLN: INTRAVENOUS | Status: DC

## 2023-08-11 MED ORDER — ACETAMINOPHEN 650 MG RE SUPP
650.0000 mg | Freq: Four times a day (QID) | RECTAL | Status: DC | PRN
Start: 2023-08-11 — End: 2023-08-15

## 2023-08-11 MED ORDER — CHLORDIAZEPOXIDE HCL 25 MG PO CAPS: 50.0000 mg | ORAL_CAPSULE | Freq: Three times a day (TID) | ORAL | Status: DC

## 2023-08-11 MED ORDER — SENNOSIDES-DOCUSATE SODIUM 8.6-50 MG PO TABS: 1.0000 | ORAL_TABLET | Freq: Every evening | ORAL | Status: DC | PRN

## 2023-08-11 MED ORDER — PROSOURCE TF20 ENFIT COMPATIBL EN LIQD
60.0000 mL | Freq: Two times a day (BID) | ENTERAL | Status: DC
  Administered 2023-08-11 (×2): 60 mL
  Filled 2023-08-11 (×2): qty 60

## 2023-08-11 MED ORDER — HYDRALAZINE HCL 20 MG/ML IJ SOLN
10.0000 mg | INTRAMUSCULAR | Status: DC | PRN
  Administered 2023-08-11 – 2023-08-13 (×6): 10 mg via INTRAVENOUS
  Filled 2023-08-11 (×7): qty 1

## 2023-08-11 MED ORDER — SODIUM BICARBONATE 650 MG PO TABS
650.0000 mg | ORAL_TABLET | Freq: Two times a day (BID) | ORAL | Status: DC
  Administered 2023-08-11 – 2023-08-14 (×8): 650 mg
  Filled 2023-08-11 (×8): qty 1

## 2023-08-11 MED ORDER — CHLORDIAZEPOXIDE HCL 25 MG PO CAPS: 50.0000 mg | ORAL_CAPSULE | Freq: Every day | ORAL | Status: DC

## 2023-08-11 MED ORDER — LISINOPRIL 20 MG PO TABS
20.0000 mg | ORAL_TABLET | Freq: Every day | ORAL | Status: DC
  Administered 2023-08-11: 20 mg
  Filled 2023-08-11: qty 1

## 2023-08-11 MED ORDER — CHLORDIAZEPOXIDE HCL 25 MG PO CAPS
50.0000 mg | ORAL_CAPSULE | Freq: Two times a day (BID) | ORAL | Status: AC
  Administered 2023-08-14 (×2): 50 mg
  Filled 2023-08-11 (×2): qty 2

## 2023-08-11 MED ORDER — ONDANSETRON HCL 4 MG/2ML IJ SOLN: 4.0000 mg | Freq: Four times a day (QID) | INTRAMUSCULAR | Status: DC | PRN

## 2023-08-11 MED ORDER — LORAZEPAM 2 MG/ML IJ SOLN: 4.0000 mg | Freq: Once | INTRAMUSCULAR | Status: DC | PRN

## 2023-08-11 MED ORDER — CHLORDIAZEPOXIDE HCL 25 MG PO CAPS: 50.0000 mg | ORAL_CAPSULE | ORAL | Status: DC

## 2023-08-11 MED ORDER — CLONAZEPAM 0.125 MG PO TBDP: 0.5000 mg | ORAL_TABLET | Freq: Two times a day (BID) | ORAL | Status: DC

## 2023-08-11 MED ORDER — ADULT MULTIVITAMIN W/MINERALS CH
1.0000 | ORAL_TABLET | Freq: Every day | ORAL | Status: DC
  Administered 2023-08-12 – 2023-08-14 (×3): 1
  Filled 2023-08-11 (×3): qty 1

## 2023-08-11 MED ORDER — ONDANSETRON HCL 4 MG PO TABS
4.0000 mg | ORAL_TABLET | Freq: Four times a day (QID) | ORAL | Status: DC | PRN
Start: 2023-08-11 — End: 2023-08-15

## 2023-08-11 MED ORDER — CHLORDIAZEPOXIDE HCL 25 MG PO CAPS: 100.0000 mg | ORAL_CAPSULE | Freq: Four times a day (QID) | ORAL | Status: DC

## 2023-08-11 MED ORDER — LORAZEPAM 2 MG/ML IJ SOLN
1.0000 mg | INTRAMUSCULAR | Status: DC | PRN
  Administered 2023-08-11 (×2): 4 mg via INTRAVENOUS
  Administered 2023-08-11 (×2): 5 mg via INTRAVENOUS
  Administered 2023-08-11: 4 mg via INTRAVENOUS
  Administered 2023-08-11 (×2): 5 mg via INTRAVENOUS
  Administered 2023-08-12: 4 mg via INTRAVENOUS
  Administered 2023-08-12 (×3): 5 mg via INTRAVENOUS
  Administered 2023-08-12: 4 mg via INTRAVENOUS
  Administered 2023-08-12: 5 mg via INTRAVENOUS
  Administered 2023-08-12: 4 mg via INTRAVENOUS
  Administered 2023-08-13: 5 mg via INTRAVENOUS
  Administered 2023-08-13: 4 mg via INTRAVENOUS
  Administered 2023-08-13 (×2): 5 mg via INTRAVENOUS
  Administered 2023-08-13: 4 mg via INTRAVENOUS
  Administered 2023-08-13 (×2): 2 mg via INTRAVENOUS
  Administered 2023-08-13: 4 mg via INTRAVENOUS
  Administered 2023-08-13 (×2): 2 mg via INTRAVENOUS
  Administered 2023-08-13: 4 mg via INTRAVENOUS
  Administered 2023-08-14: 3 mg via INTRAVENOUS
  Filled 2023-08-11 (×2): qty 2
  Filled 2023-08-11: qty 3
  Filled 2023-08-11: qty 2
  Filled 2023-08-11 (×2): qty 3
  Filled 2023-08-11: qty 2
  Filled 2023-08-11: qty 3
  Filled 2023-08-11: qty 1
  Filled 2023-08-11: qty 2
  Filled 2023-08-11: qty 3
  Filled 2023-08-11: qty 2
  Filled 2023-08-11: qty 3
  Filled 2023-08-11 (×2): qty 2
  Filled 2023-08-11: qty 1
  Filled 2023-08-11: qty 3
  Filled 2023-08-11: qty 2
  Filled 2023-08-11 (×2): qty 3
  Filled 2023-08-11 (×2): qty 1
  Filled 2023-08-11: qty 3
  Filled 2023-08-11: qty 1
  Filled 2023-08-11: qty 3
  Filled 2023-08-11 (×3): qty 2
  Filled 2023-08-11: qty 1

## 2023-08-11 MED ORDER — CHLORDIAZEPOXIDE HCL 25 MG PO CAPS
50.0000 mg | ORAL_CAPSULE | Freq: Three times a day (TID) | ORAL | Status: AC
  Administered 2023-08-13 (×3): 50 mg
  Filled 2023-08-11 (×3): qty 2

## 2023-08-11 MED ORDER — OSMOLITE 1.5 CAL PO LIQD
1000.0000 mL | ORAL | Status: DC
  Administered 2023-08-11: 1000 mL

## 2023-08-11 MED ORDER — HYDRALAZINE HCL 20 MG/ML IJ SOLN
10.0000 mg | Freq: Once | INTRAMUSCULAR | Status: AC
  Administered 2023-08-11: 10 mg via INTRAVENOUS
  Filled 2023-08-11: qty 1

## 2023-08-11 MED ORDER — POTASSIUM CHLORIDE 20 MEQ PO PACK
40.0000 meq | PACK | Freq: Once | ORAL | Status: AC
  Administered 2023-08-11: 40 meq
  Filled 2023-08-11: qty 2

## 2023-08-11 MED ORDER — ACETAMINOPHEN 325 MG PO TABS
650.0000 mg | ORAL_TABLET | Freq: Four times a day (QID) | ORAL | Status: DC | PRN
  Administered 2023-08-11 – 2023-08-14 (×4): 650 mg
  Filled 2023-08-11 (×4): qty 2

## 2023-08-11 MED ORDER — CHLORDIAZEPOXIDE HCL 25 MG PO CAPS
75.0000 mg | ORAL_CAPSULE | Freq: Four times a day (QID) | ORAL | Status: AC
  Administered 2023-08-11 (×4): 75 mg
  Filled 2023-08-11 (×4): qty 3

## 2023-08-11 MED ORDER — CHLORDIAZEPOXIDE HCL 25 MG PO CAPS
25.0000 mg | ORAL_CAPSULE | Freq: Two times a day (BID) | ORAL | Status: DC
  Filled 2023-08-11 (×2): qty 1

## 2023-08-11 MED ORDER — CHLORDIAZEPOXIDE HCL 25 MG PO CAPS
50.0000 mg | ORAL_CAPSULE | Freq: Four times a day (QID) | ORAL | Status: AC
  Administered 2023-08-12 – 2023-08-13 (×4): 50 mg
  Filled 2023-08-11 (×4): qty 2

## 2023-08-11 NOTE — Progress Notes (Addendum)
Initial Nutrition Assessment  DOCUMENTATION CODES:  Obesity unspecified  INTERVENTION:  Initiate tube feeding via NGT: Osmolite 1.5 at 60 ml/h (1440 ml per day) Start at 30 and advance by 10mL q12h to goal Prosource TF20 60 ml BID Provides 2320 kcal, 130 gm protein, 1097 ml free water daily Exchange for cortrak 12/13 if feasible Continue MVI, thiamine, and folic acid  NUTRITION DIAGNOSIS:  Inadequate oral intake related to lethargy/confusion as evidenced by NPO status.  GOAL:  Patient will meet greater than or equal to 90% of their needs  MONITOR:  I & O's, Labs, Weight trends  REASON FOR ASSESSMENT:  Consult Enteral/tube feeding initiation and management  ASSESSMENT:  Pt with hx of HTN, EtOH and drug abuse presented to ED with AMS. Found to have elevated LFTS, EtOH levels, and ammonia.   Pt resting in bed at the time of assessment. Agitated and restless and moaning. NGT in place for medications and CCM requests TF be initiated. Noted to be in the stomach per XR 12/11. RN reports that pt was calm this AM but that mental status has worsened over the course of the day.   No significant muscle of fat deficits noted on exam - not suggestive of malnutrition but no family present to provide a nutrition hx so unsure of baseline intake or diet quality.   Discussed with RN and NP, if pt sedated/calm, will exchange NGT for cortrak for pt comfort and more secure placement. If pt agitated, could just consider placing bridle on 35F NGT to assist with keeping tube in position.   Admit / Current weight: 116.1 kg   Intake/Output Summary (Last 24 hours) at 08/11/2023 1425 Last data filed at 08/11/2023 1000 Gross per 24 hour  Intake 1979.96 ml  Output 3430 ml  Net -1450.04 ml  Net IO Since Admission: -2,050.04 mL [08/11/23 1425]  Drains/Lines: FMS 2.45L out x 24 hours UOP 1.4L out x 24 hours  Nutritionally Relevant Medications: Scheduled Meds:  folic acid  1 mg Per Tube Daily    lactulose  30 g Per Tube Q8H   multivitamin with minerals  1 tablet Per Tube Daily   potassium chloride  40 mEq Per Tube Once   sodium bicarbonate  650 mg Per Tube BID   Continuous Infusions:  dexmedetomidine (PRECEDEX) IV infusion 1.6 mcg/kg/hr (08/11/23 1304)   lactated ringers     thiamine (VITAMIN B1) injection Stopped (08/11/23 0646)   PRN Meds: ondansetron, senna-docusate  Labs Reviewed: K 3.3 Chloride 114 CBG ranges from 141-154 mg/dL over the last 24 hours  NUTRITION - FOCUSED PHYSICAL EXAM: Flowsheet Row Most Recent Value  Orbital Region No depletion  Upper Arm Region No depletion  Thoracic and Lumbar Region No depletion  Buccal Region No depletion  Temple Region No depletion  Clavicle Bone Region No depletion  Clavicle and Acromion Bone Region No depletion  Scapular Bone Region No depletion  Dorsal Hand No depletion  Patellar Region No depletion  Anterior Thigh Region Mild depletion  Posterior Calf Region No depletion  Edema (RD Assessment) Mild  [generalized]  Hair Reviewed  Eyes Reviewed  Mouth Reviewed  Skin Reviewed  Nails Reviewed    Diet Order:   Diet Order     None       EDUCATION NEEDS:  Not appropriate for education at this time  Skin:  Skin Assessment: Reviewed RN Assessment  Last BM:  12/12 - type 7, receiving lactulose FMS in place since 12/11 output x 24 hours  Height:  Ht Readings from Last 1 Encounters:  08/10/23 5\' 10"  (1.778 m)    Weight:  Wt Readings from Last 1 Encounters:  08/10/23 116.1 kg    Ideal Body Weight:  75.5 kg  BMI:  Body mass index is 36.73 kg/m.  Estimated Nutritional Needs:  Kcal:  2300-2500 kcal/d Protein:  110-135g/d Fluid:  2.3-2.5L/d    Greig Castilla, RD, LDN Registered Dietitian II Please reach out via secure chat Weekend on-call pager # available in [facility]

## 2023-08-11 NOTE — Progress Notes (Addendum)
NAME:  Juan Clarke, MRN:  [id number], DOB:  1981-07-04, LOS: 1 ADMISSION DATE:  08/10/2023, CONSULTATION DATE: 08/10/2023 REFERRING MD: Emergency department physician, CHIEF COMPLAINT: Agitation alcohol withdrawal  History of Present Illness:  42 yoM with PMH of anxiety, polysubstance abuse, ETOH use, HTN, and migraine who presented with AMS, not feeling well for 2 days prior c/o of nausea and abd pain.  Reportedly 7 weeks sober from ETOH. Ethanol 86, ammonia 165, UDS neg. Treated with lactulose enema.  Remained severely agitated in ER despite multiple doses of ativan and haldol, admitted to ICU on precedex and librium taper  Pertinent  Medical History   Past Medical History:  Diagnosis Date   GAD (generalized anxiety disorder)    HTN (hypertension)    Insomnia    Migraine without aura    Overweight    Trigeminal neuralgia    Significant Hospital Events: Including procedures, antibiotic start and stop dates in addition to other pertinent events   12/11 admitted   Interim History / Subjective:  Remains on precedex 1.2 getting prn ativan  Objective   Blood pressure (!) 163/98, pulse 87, temperature 98.7 F (37.1 C), temperature source Axillary, resp. rate (!) 28, height 5\' 10"  (1.778 m), weight 116.1 kg, SpO2 94%.        Intake/Output Summary (Last 24 hours) at 08/11/2023 0941 Last data filed at 08/11/2023 0800 Gross per 24 hour  Intake 1979.96 ml  Output 3850 ml  Net -1870.04 ml   Filed Weights   08/10/23 0734  Weight: 116.1 kg    Examination: Dex 1.6 General:  adult male intermittently restless and agitated in bed with bilateral soft wrist restraints, mitts, and posey belt HEENT: MM pink/dry, chapped lips, pupils 4/r,  Neuro: eyes open, does not follow commands or verbalize, moans, MAE spont, agitated at times CV: rr, no murmur PULM:  non labored, clear  GI: soft, +bs, NT, purwick Extremities: warm/dry, no LE edema  Skin: no rashes   UOP 1.4L/ 24hrs Net  -2.1L Labs reviewed, K 3.3, bicarb 15  Resolved Hospital Problem list   DM  Assessment & Plan:  Acute metabolic and hepatic encephalopathy secondary to ETOH withdrawal - cont to wean precedex as able - CIWA w/ prn ativan and librium taper  - safety sitter at bedside, restraints prn - PRN haldol, monitor QTc - supportive care/ aspiration precautions.  At risk for airway compromise, currently protecting airway - remains NPO w/ NGT - cont lactulose per tube, goal Bms 2-3 daily - trend LFTs/ ammonia  - thiamine, folate, MVI - remains non-focal, consider imaging if worsening mental status/ focal deficits - temp max 100.2, check UA, stable O2 needs, consider repeat CXR if worsening  Acute on chronic Transaminitis with hyperammoniemia - RUQ limited US c/w fatty liver, no stones or ductal dilation - trend LFTs down trending, t.bili stable - lactulose as above  NAGMA Mild hyperchloremic acidosis - stool output 2.4L/ 24hrs w/ lactulose, decreasing output - bicarb tablets, change NS to LR - trend BMET - stable UOP  Hypertension - resume pta lisinopril, hold hydrochlorothiazide  HLD - hold statin with elevated LFTs  Depression and anxiety - hold pta paxil, cymbalta, klonopin (PRN)  Hypothyroidism - TSH 8.579 12/11 - synthroid  Hypokalemia - replete.  Trend on BMET w/ Mag/ phos.  Replete prn   Best Practice (right click and "Reselect all SmartList Selections" daily)   Diet/type: tubefeeds and NPO; RD consulted DVT prophylaxis LMWH Pressure ulcer(s): N/A GI prophylaxis: N/A Lines:  N/A Foley:  N/A Code Status:  full code Last date of multidisciplinary goals of care discussion [to be determined]  Pending 12/12, no family at bedside. Wife called, no answer, message left.   Labs   CBC: Recent Labs  Lab 08/10/23 0805 08/10/23 0810  WBC 4.4  --   HGB 15.6 15.6  HCT 44.1 46.0  MCV 92.8  --   PLT 187  --     Basic Metabolic Panel: Recent Labs  Lab 08/10/23 0805  08/10/23 0810 08/10/23 1310 08/11/23 0826  NA 135 137  --  143  K 4.0 3.9  --  3.3*  CL 103  --   --  114*  CO2 17*  --   --  15*  GLUCOSE 135*  --   --  139*  BUN 10  --   --  15  CREATININE 0.87  --   --  1.16  CALCIUM 8.7*  --   --  9.0  MG  --   --  2.0  --   PHOS  --   --  6.2*  --    GFR: Estimated Creatinine Clearance: 108 mL/min (by C-G formula based on SCr of 1.16 mg/dL). Recent Labs  Lab 08/10/23 0805 08/10/23 1310 08/10/23 2157 08/11/23 0525  WBC 4.4  --   --   --   LATICACIDVEN  --  2.7* 4.2* 2.0*    Liver Function Tests: Recent Labs  Lab 08/10/23 0805 08/11/23 0826  AST 167* 135*  ALT 113* 83*  ALKPHOS 86 79  BILITOT 1.2* 1.3*  PROT 6.4* 6.2*  ALBUMIN 4.2 4.0   No results for input(s): "LIPASE", "AMYLASE" in the last 168 hours. Recent Labs  Lab 08/10/23 0805  AMMONIA 165*    ABG    Component Value Date/Time   HCO3 16.7 (L) 08/10/2023 2150   TCO2 22 08/10/2023 0810   ACIDBASEDEF 5.5 (H) 08/10/2023 2150   O2SAT 96.9 08/10/2023 2150     Coagulation Profile: Recent Labs  Lab 08/10/23 1310  INR 1.3*    Cardiac Enzymes: No results for input(s): "CKTOTAL", "CKMB", "CKMBINDEX", "TROPONINI" in the last 168 hours.  HbA1C: No results found for: "HGBA1C"  CBG: Recent Labs  Lab 08/11/23 0057 08/11/23 0353 08/11/23 0727  GLUCAP 150* 154* 141*     Past Medical History:  He,  has a past medical history of GAD (generalized anxiety disorder), HTN (hypertension), Insomnia, Migraine without aura, Overweight, and Trigeminal neuralgia.   Surgical History:   Past Surgical History:  Procedure Laterality Date   Face surgery  11/14/14   TONSILLECTOMY     42 yr old     Social History:   reports that he has never smoked. He has never used smokeless tobacco. He reports current alcohol use of about 42.0 standard drinks of alcohol per week. He reports current drug use. Frequency: 7.00 times per week. Drug: Marijuana.   Family History:  His  family history includes Brain cancer in his maternal grandmother; Drug abuse in his sister; Heart disease in his paternal grandfather; Hypertension in his father; Mental illness in his sister; Migraines in his mother.   Allergies Allergies  Allergen Reactions   Lexapro [Escitalopram Oxalate] Other (See Comments)    Lack of therapeutic effect    Zoloft [Sertraline Hcl] Other (See Comments)    Lack of therapeutic effect      Home Medications  Prior to Admission medications   Medication Sig Start Date End Date Taking? Authorizing Provider  acamprosate (CAMPRAL) 333 MG tablet Take 2 tablets by mouth 3 (three) times daily.    [provider]  allopurinol (ZYLOPRIM) 300 MG tablet Take 300 mg by mouth daily.    [provider]  atorvastatin (LIPITOR) 10 MG tablet Take 10 mg by mouth daily. 03/08/19   [provider]  clonazePAM (KLONOPIN) 0.5 MG tablet Take 0.5 mg by mouth 2 (two) times daily as needed for anxiety.  06/07/14   [provider]  DULoxetine (CYMBALTA) 60 MG capsule Take 60 mg by mouth daily. 07/27/23   [provider]  levothyroxine (SYNTHROID) 50 MCG tablet Take 50 mcg by mouth daily before breakfast. 01/03/19   [provider]  lisdexamfetamine (VYVANSE) 30 MG capsule Take 30 mg by mouth daily.    [provider]  lisinopril-hydrochlorothiazide (ZESTORETIC) 20-25 MG tablet Take 1 tablet by mouth daily. 12/15/18   [provider]  meclizine (ANTIVERT) 12.5 MG tablet Take 1 tablet (12.5 mg total) by mouth 3 (three) times daily as needed for dizziness. Patient not taking: Reported on 11/28/2022 03/13/19   Palumbo, April, MD  meloxicam (MOBIC) 15 MG tablet Take 15 mg by mouth daily as needed for pain.    [provider]  ondansetron (ZOFRAN ODT) 8 MG disintegrating tablet 8mg  ODT q8 hours prn nausea Patient taking differently: Take 8 mg by mouth every 8 (eight) hours as needed for nausea. 03/13/19   Palumbo, April,  MD  PARoxetine (PAXIL) 20 MG tablet Take 20 mg by mouth daily. 02/10/19   [provider]     Critical care time: 41        Posey Boyer, MSN, AG-ACNP-BC [facility] 08/11/2023, 9:41 AM  See Amion for pager If no response to pager , please call [phone] until 7pm After 7:00 pm call Elink  [phone]

## 2023-08-12 ENCOUNTER — Inpatient Hospital Stay (HOSPITAL_COMMUNITY): Payer: BC Managed Care – PPO

## 2023-08-12 DIAGNOSIS — G928 Other toxic encephalopathy: Secondary | ICD-10-CM | POA: Diagnosis not present

## 2023-08-12 LAB — RENAL FUNCTION PANEL
Albumin: 3.5 g/dL (ref 3.5–5.0)
Anion gap: 11 (ref 5–15)
BUN: 17 mg/dL (ref 6–20)
CO2: 18 mmol/L — ABNORMAL LOW (ref 22–32)
Calcium: 8.7 mg/dL — ABNORMAL LOW (ref 8.9–10.3)
Chloride: 116 mmol/L — ABNORMAL HIGH (ref 98–111)
Creatinine, Ser: 0.98 mg/dL (ref 0.61–1.24)
GFR, Estimated: >60 mL/min (ref >60)
Glucose, Bld: 137 mg/dL — ABNORMAL HIGH (ref 70–99)
Phosphorus: 3.6 mg/dL (ref 2.5–4.6)
Potassium: 3.1 mmol/L — ABNORMAL LOW (ref 3.5–5.1)
Sodium: 145 mmol/L (ref 135–145)

## 2023-08-12 LAB — GLUCOSE, CAPILLARY
Glucose-Capillary: 139 mg/dL — ABNORMAL HIGH (ref 70–99)
Glucose-Capillary: 139 mg/dL — ABNORMAL HIGH (ref 70–99)
Glucose-Capillary: 115 mg/dL — ABNORMAL HIGH (ref 70–99)
Glucose-Capillary: 134 mg/dL — ABNORMAL HIGH (ref 70–99)
Glucose-Capillary: 126 mg/dL — ABNORMAL HIGH (ref 70–99)
Glucose-Capillary: 145 mg/dL — ABNORMAL HIGH (ref 70–99)

## 2023-08-12 LAB — HEPATIC FUNCTION PANEL
ALT: 56 U/L — ABNORMAL HIGH (ref 0–44)
AST: 76 U/L — ABNORMAL HIGH (ref 15–41)
Albumin: 3.6 g/dL (ref 3.5–5.0)
Alkaline Phosphatase: 62 U/L (ref 38–126)
Bilirubin, Direct: 0.2 mg/dL (ref 0.0–0.2)
Indirect Bilirubin: 0.9 mg/dL (ref 0.3–0.9)
Total Bilirubin: 1.1 mg/dL (ref <1.2)
Total Protein: 6 g/dL — ABNORMAL LOW (ref 6.5–8.1)

## 2023-08-12 LAB — CBC
HCT: 37.4 % — ABNORMAL LOW (ref 39.0–52.0)
Hemoglobin: 13 g/dL (ref 13.0–17.0)
MCH: 32.9 pg (ref 26.0–34.0)
MCHC: 34.8 g/dL (ref 30.0–36.0)
MCV: 94.7 fL (ref 80.0–100.0)
Platelets: 115 10*3/uL — ABNORMAL LOW (ref 150–400)
RBC: 3.95 MIL/uL — ABNORMAL LOW (ref 4.22–5.81)
RDW: 14.9 % (ref 11.5–15.5)
WBC: 8.7 10*3/uL (ref 4.0–10.5)
nRBC: 0 % (ref 0.0–0.2)

## 2023-08-12 LAB — MAGNESIUM: Magnesium: 1.8 mg/dL (ref 1.7–2.4)

## 2023-08-12 LAB — AMMONIA: Ammonia: 51 umol/L — ABNORMAL HIGH (ref 9–35)

## 2023-08-12 MED ORDER — LEVOTHYROXINE SODIUM 50 MCG PO TABS
50.0000 ug | ORAL_TABLET | Freq: Every day | ORAL | Status: DC
Start: 2023-08-13 — End: 2023-08-15
  Administered 2023-08-13 – 2023-08-14 (×2): 50 ug
  Filled 2023-08-12 (×2): qty 1

## 2023-08-12 MED ORDER — OSMOLITE 1.5 CAL PO LIQD
1000.0000 mL | ORAL | Status: DC
Start: 2023-08-12 — End: 2023-08-15
  Administered 2023-08-12 – 2023-08-14 (×3): 1000 mL

## 2023-08-12 MED ORDER — POTASSIUM CHLORIDE 20 MEQ PO PACK
40.0000 meq | PACK | ORAL | Status: AC
  Administered 2023-08-12 (×2): 40 meq
  Filled 2023-08-12 (×2): qty 2

## 2023-08-12 MED ORDER — PROSOURCE TF20 ENFIT COMPATIBL EN LIQD
60.0000 mL | Freq: Two times a day (BID) | ENTERAL | Status: DC
Start: 2023-08-12 — End: 2023-08-15
  Administered 2023-08-12 – 2023-08-15 (×7): 60 mL
  Filled 2023-08-12 (×7): qty 60

## 2023-08-12 MED ORDER — FREE WATER
200.0000 mL | Status: DC
Start: 2023-08-12 — End: 2023-08-15
  Administered 2023-08-12 – 2023-08-15 (×17): 200 mL

## 2023-08-12 MED ORDER — LACTATED RINGERS IV BOLUS
500.0000 mL | Freq: Once | INTRAVENOUS | Status: AC
  Administered 2023-08-12: 500 mL via INTRAVENOUS

## 2023-08-12 MED ORDER — MAGNESIUM SULFATE 2 GM/50ML IV SOLN
2.0000 g | Freq: Once | INTRAVENOUS | Status: AC
  Administered 2023-08-12: 2 g via INTRAVENOUS
  Filled 2023-08-12: qty 50

## 2023-08-12 MED ORDER — LACTULOSE 10 GM/15ML PO SOLN
30.0000 g | Freq: Two times a day (BID) | ORAL | Status: DC
Start: 2023-08-12 — End: 2023-08-13
  Administered 2023-08-12 – 2023-08-13 (×2): 30 g
  Filled 2023-08-12 (×2): qty 45

## 2023-08-12 NOTE — Procedures (Signed)
Cortrak  Person Inserting Tube:  Mahala Menghini, RD Tube Type:  Cortrak - 55 inches Tube Size:  10 Tube Location:  Right nare Secured by: Bridle Cortrak Secured At:  98 cm   Cortrak Tube Team Note:  Consult received to place a Cortrak feeding tube.   X-ray is required, abdominal x-ray has been ordered by the Cortrak team. Please confirm tube placement before using the Cortrak tube.   If the tube becomes dislodged please keep the tube and contact the Cortrak team at [email or web address] for replacement.  If after hours and replacement cannot be delayed, place a NG tube and confirm placement with an abdominal x-ray.    Mertie Clause, MS, RD, LDN Registered Dietitian II Please see AMiON for contact information.

## 2023-08-12 NOTE — Progress Notes (Signed)
Nutrition Support Brief Note  TF orders entered 12/12 but discontinued overnight as pt was felt to be at high risk for NGT dislodgement. Cortrak placed this AM and terminates in the distal duodenum. Ok to restart feeds. For last full nutrition assessment, see full note from 12/12.  Recommend the following: Initiate tube feeding via post-pyloric: Osmolite 1.5 at 60 ml/h (1440 ml per day) Start at 30 and advance by 10mL q12h to goal Prosource TF20 60 ml BID Provides 2320 kcal, 130 gm protein, 1097 ml free water daily  Greig Castilla, RD, LDN Registered Dietitian II Please reach out via secure chat Weekend on-call pager # available in [facility]

## 2023-08-12 NOTE — Progress Notes (Signed)
eLink Physician-Brief Progress Note Patient Name: Juan Clarke DOB: 1980/12/30 MRN: [id number]   Date of Service  08/12/2023  HPI/Events of Note  Patient with persisting agitated delirium with increased risk of interference with medical care / falls. RN requesting restraints renewal.  eICU Interventions  Restraints order renewed.        Thomasene Lot Hieu Herms 08/12/2023, 5:59 AM

## 2023-08-12 NOTE — Progress Notes (Signed)
NAME:  Juan Clarke, MRN:  [id number], DOB:  1981/02/26, LOS: 2 ADMISSION DATE:  08/10/2023, CONSULTATION DATE: 08/10/2023 REFERRING MD: Emergency department physician, CHIEF COMPLAINT: Agitation alcohol withdrawal  History of Present Illness:  42 yoM with PMH of anxiety, polysubstance abuse, ETOH use, HTN, and migraine who presented with AMS, not feeling well for 2 days prior c/o of nausea and abd pain.  Reportedly 7 weeks sober from ETOH. Ethanol 86, ammonia 165, UDS neg. Treated with lactulose enema.  Remained severely agitated in ER despite multiple doses of ativan and haldol, admitted to ICU on precedex and librium taper  Pertinent  Medical History   Past Medical History:  Diagnosis Date   GAD (generalized anxiety disorder)    HTN (hypertension)    Insomnia    Migraine without aura    Overweight    Trigeminal neuralgia    Significant Hospital Events: Including procedures, antibiotic start and stop dates in addition to other pertinent events   12/11 admitted   Interim History / Subjective:  Remains on precedex 1.2  Slightly improved agitation, oriented to person, following commands Febrile 103.2 overnight  Objective   Blood pressure (!) 157/89, pulse 78, temperature (!) 102.2 F (39 C), temperature source Axillary, resp. rate (!) 28, height 5\' 10"  (1.778 m), weight 113.7 kg, SpO2 95%.        Intake/Output Summary (Last 24 hours) at 08/12/2023 9528 Last data filed at 08/12/2023 0800 Gross per 24 hour  Intake 3675.9 ml  Output 3030 ml  Net 645.9 ml   Filed Weights   08/10/23 0734 08/12/23 0500  Weight: 116.1 kg 113.7 kg   Examination: Dex 1.7 General:  ill appearing adult male lying in bed, improved agitation, remains in wrist and posey restraints HEENT: MM pink/dry, pupils 4/r Neuro: opens eyes to verbal, oriented to person, follows simple commands in all extremities CV: rr, NSR, no murmur PULM:  non labored, clear, diminished in bases, stable on 2L Queets GI:  soft, bs+, NT, purwick- urine dk amber  Extremities: warm/dry, no pitting LE edema  Skin: no rashes    UOP 1.3L/ 24hrs Net -1.7L/ 24hrs Stool output 1.7L Labs reviewed, K 3.1, Cl 114> 116, bicarb 15> 18, sCr 0.98, BUN 17, AST/ ALT down trending, t.bili normalized, Mag 1.8, WBC 8.7, plts down 115  Resolved Hospital Problem list   DM  Assessment & Plan:  Acute metabolic and hepatic encephalopathy secondary to ETOH withdrawal - less agitation this morning, oriented to self, overall clinically improving - cont to wean precedex/ CIWA as able with prn ativan and librium taper.  Seems to be clinically improving - safety sitter at bedside, restraints prn - PRN haldol, monitor QTc - supportive care/ aspiration precautions.  Continues to protect airway - NGT replaced for cortrak> post pyloric for aspiration precautions.  TF per RD  - ammonia improved.  Given diarrhea, decrease lactulose to BID, prn all other bowel regimen - LFTs improving.  No previous hepatitis panel, added for am - thiamine, folate, MVI - febrile overnight but WBC, UA, and O2/ lung sounds reassuring.  Likely related to withdrawals.  Continue to monitor clinically off abx   Acute on chronic Transaminitis with hyperammoniemia - RUQ limited US c/w fatty liver, no stones or ductal dilation - trend LFTs down trending, t.bili stable - lactulose as above - hepatitis panel in am   NAGMA Mild hyperchloremic acidosis Hypokalemia - stool output still high but improved - cont enteral bicarb - stop IVF and add FWF  with TF per cortrak - replete electrolytes aggressively, Mag > 2, K >4 - - trend renal indices  - strict I/Os, daily wts - avoid nephrotoxins, renal dose meds, hemodynamic support as above  Hypertension - holding home lisinopril, prn hydralazine   HLD - hold statin with elevated LFTs, likely resume soon  Depression and anxiety - hold pta paxil, cymbalta, klonopin (PRN)  Hypothyroidism - TSH 8.579 12/11 -  synthroid   Best Practice (right click and "Reselect all SmartList Selections" daily)   Diet/type: tubefeeds and NPO; RD consulted DVT prophylaxis LMWH Pressure ulcer(s): N/A GI prophylaxis: N/A Lines: N/A Foley:  N/A Code Status:  full code Last date of multidisciplinary goals of care discussion [to be determined]  Pending update 12/13.    Labs   CBC: Recent Labs  Lab 08/10/23 0805 08/10/23 0810 08/12/23 0555  WBC 4.4  --  8.7  HGB 15.6 15.6 13.0  HCT 44.1 46.0 37.4*  MCV 92.8  --  94.7  PLT 187  --  115*    Basic Metabolic Panel: Recent Labs  Lab 08/10/23 0805 08/10/23 0810 08/10/23 1310 08/11/23 0826 08/12/23 0555 08/12/23 0556  NA 135 137  --  143  --  145  K 4.0 3.9  --  3.3*  --  3.1*  CL 103  --   --  114*  --  116*  CO2 17*  --   --  15*  --  18*  GLUCOSE 135*  --   --  139*  --  137*  BUN 10  --   --  15  --  17  CREATININE 0.87  --   --  1.16  --  0.98  CALCIUM 8.7*  --   --  9.0  --  8.7*  MG  --   --  2.0  --  1.8  --   PHOS  --   --  6.2*  --   --  3.6   GFR: Estimated Creatinine Clearance: 126.6 mL/min (by C-G formula based on SCr of 0.98 mg/dL). Recent Labs  Lab 08/10/23 0805 08/10/23 1310 08/10/23 2157 08/11/23 0525 08/12/23 0555  WBC 4.4  --   --   --  8.7  LATICACIDVEN  --  2.7* 4.2* 2.0*  --     Liver Function Tests: Recent Labs  Lab 08/10/23 0805 08/11/23 0826 08/12/23 0555 08/12/23 0556  AST 167* 135* 76*  --   ALT 113* 83* 56*  --   ALKPHOS 86 79 62  --   BILITOT 1.2* 1.3* 1.1  --   PROT 6.4* 6.2* 6.0*  --   ALBUMIN 4.2 4.0 3.6 3.5   No results for input(s): "LIPASE", "AMYLASE" in the last 168 hours. Recent Labs  Lab 08/10/23 0805 08/12/23 0555  AMMONIA 165* 51*    ABG    Component Value Date/Time   HCO3 16.7 (L) 08/10/2023 2150   TCO2 22 08/10/2023 0810   ACIDBASEDEF 5.5 (H) 08/10/2023 2150   O2SAT 96.9 08/10/2023 2150     Coagulation Profile: Recent Labs  Lab 08/10/23 1310  INR 1.3*    Cardiac  Enzymes: No results for input(s): "CKTOTAL", "CKMB", "CKMBINDEX", "TROPONINI" in the last 168 hours.  HbA1C: No results found for: "HGBA1C"  CBG: Recent Labs  Lab 08/11/23 1517 08/11/23 1940 08/11/23 2351 08/12/23 0342 08/12/23 0741  GLUCAP 149* 133* 130* 139* 139*     Past Medical History:  He,  has a past medical history of GAD (generalized  anxiety disorder), HTN (hypertension), Insomnia, Migraine without aura, Overweight, and Trigeminal neuralgia.   Surgical History:   Past Surgical History:  Procedure Laterality Date   Face surgery  11/14/14   TONSILLECTOMY     42 yr old     Social History:   reports that he has never smoked. He has never used smokeless tobacco. He reports current alcohol use of about 42.0 standard drinks of alcohol per week. He reports current drug use. Frequency: 7.00 times per week. Drug: Marijuana.   Family History:  His family history includes Brain cancer in his maternal grandmother; Drug abuse in his sister; Heart disease in his paternal grandfather; Hypertension in his father; Mental illness in his sister; Migraines in his mother.   Allergies Allergies  Allergen Reactions   Lexapro [Escitalopram Oxalate] Other (See Comments)    Lack of therapeutic effect    Zoloft [Sertraline Hcl] Other (See Comments)    Lack of therapeutic effect      Home Medications  Prior to Admission medications   Medication Sig Start Date End Date Taking? Authorizing Provider  acamprosate (CAMPRAL) 333 MG tablet Take 2 tablets by mouth 3 (three) times daily.    [provider]  allopurinol (ZYLOPRIM) 300 MG tablet Take 300 mg by mouth daily.    [provider]  atorvastatin (LIPITOR) 10 MG tablet Take 10 mg by mouth daily. 03/08/19   [provider]  clonazePAM (KLONOPIN) 0.5 MG tablet Take 0.5 mg by mouth 2 (two) times daily as needed for anxiety.  06/07/14   [provider]  DULoxetine (CYMBALTA) 60 MG capsule Take 60 mg by mouth  daily. 07/27/23   [provider]  levothyroxine (SYNTHROID) 50 MCG tablet Take 50 mcg by mouth daily before breakfast. 01/03/19   [provider]  lisdexamfetamine (VYVANSE) 30 MG capsule Take 30 mg by mouth daily.    [provider]  lisinopril-hydrochlorothiazide (ZESTORETIC) 20-25 MG tablet Take 1 tablet by mouth daily. 12/15/18   [provider]  meclizine (ANTIVERT) 12.5 MG tablet Take 1 tablet (12.5 mg total) by mouth 3 (three) times daily as needed for dizziness. Patient not taking: Reported on 11/28/2022 03/13/19   Palumbo, April, MD  meloxicam (MOBIC) 15 MG tablet Take 15 mg by mouth daily as needed for pain.    [provider]  ondansetron (ZOFRAN ODT) 8 MG disintegrating tablet 8mg  ODT q8 hours prn nausea Patient taking differently: Take 8 mg by mouth every 8 (eight) hours as needed for nausea. 03/13/19   Palumbo, April, MD  PARoxetine (PAXIL) 20 MG tablet Take 20 mg by mouth daily. 02/10/19   [provider]     Critical care time: 53        Posey Boyer, MSN, AG-ACNP-BC [facility] 08/12/2023, 8:32 AM  See Amion for pager If no response to pager , please call [phone] until 7pm After 7:00 pm call Elink  [phone]

## 2023-08-13 DIAGNOSIS — F10931 Alcohol use, unspecified with withdrawal delirium: Secondary | ICD-10-CM | POA: Diagnosis not present

## 2023-08-13 DIAGNOSIS — G928 Other toxic encephalopathy: Secondary | ICD-10-CM | POA: Diagnosis not present

## 2023-08-13 LAB — CBC
HCT: 37.9 % — ABNORMAL LOW (ref 39.0–52.0)
Hemoglobin: 13 g/dL (ref 13.0–17.0)
MCH: 32.7 pg (ref 26.0–34.0)
MCHC: 34.3 g/dL (ref 30.0–36.0)
MCV: 95.5 fL (ref 80.0–100.0)
Platelets: 111 10*3/uL — ABNORMAL LOW (ref 150–400)
RBC: 3.97 MIL/uL — ABNORMAL LOW (ref 4.22–5.81)
RDW: 14.7 % (ref 11.5–15.5)
WBC: 9.1 10*3/uL (ref 4.0–10.5)
nRBC: 0.2 % (ref 0.0–0.2)

## 2023-08-13 LAB — RENAL FUNCTION PANEL
Albumin: 3.2 g/dL — ABNORMAL LOW (ref 3.5–5.0)
Anion gap: 4 — ABNORMAL LOW (ref 5–15)
BUN: 17 mg/dL (ref 6–20)
CO2: 19 mmol/L — ABNORMAL LOW (ref 22–32)
Calcium: 8 mg/dL — ABNORMAL LOW (ref 8.9–10.3)
Chloride: 117 mmol/L — ABNORMAL HIGH (ref 98–111)
Creatinine, Ser: 0.89 mg/dL (ref 0.61–1.24)
GFR, Estimated: >60 mL/min (ref >60)
Glucose, Bld: 145 mg/dL — ABNORMAL HIGH (ref 70–99)
Phosphorus: 3.7 mg/dL (ref 2.5–4.6)
Potassium: 3 mmol/L — ABNORMAL LOW (ref 3.5–5.1)
Sodium: 140 mmol/L (ref 135–145)

## 2023-08-13 LAB — HEPATITIS PANEL, ACUTE
HCV Ab: NONREACTIVE
Hep A IgM: NONREACTIVE
Hep B C IgM: NONREACTIVE
Hepatitis B Surface Ag: NONREACTIVE

## 2023-08-13 LAB — GLUCOSE, CAPILLARY
Glucose-Capillary: 138 mg/dL — ABNORMAL HIGH (ref 70–99)
Glucose-Capillary: 151 mg/dL — ABNORMAL HIGH (ref 70–99)
Glucose-Capillary: 124 mg/dL — ABNORMAL HIGH (ref 70–99)
Glucose-Capillary: 142 mg/dL — ABNORMAL HIGH (ref 70–99)
Glucose-Capillary: 144 mg/dL — ABNORMAL HIGH (ref 70–99)
Glucose-Capillary: 142 mg/dL — ABNORMAL HIGH (ref 70–99)

## 2023-08-13 LAB — MAGNESIUM: Magnesium: 2.3 mg/dL (ref 1.7–2.4)

## 2023-08-13 MED ORDER — ORAL CARE MOUTH RINSE
15.0000 mL | OROMUCOSAL | Status: DC
  Administered 2023-08-13 – 2023-08-16 (×15): 15 mL via OROMUCOSAL

## 2023-08-13 MED ORDER — POTASSIUM CHLORIDE 20 MEQ PO PACK: 20.0000 meq | PACK | ORAL | Status: DC

## 2023-08-13 MED ORDER — ENOXAPARIN SODIUM 60 MG/0.6ML IJ SOSY
55.0000 mg | PREFILLED_SYRINGE | Freq: Every day | INTRAMUSCULAR | Status: DC
  Administered 2023-08-13 – 2023-08-16 (×4): 55 mg via SUBCUTANEOUS
  Filled 2023-08-13 (×4): qty 0.6

## 2023-08-13 MED ORDER — POTASSIUM CHLORIDE 10 MEQ/100ML IV SOLN: 10.0000 meq | INTRAVENOUS | Status: DC

## 2023-08-13 MED ORDER — LACTULOSE 10 GM/15ML PO SOLN
10.0000 g | Freq: Two times a day (BID) | ORAL | Status: DC
  Administered 2023-08-13 – 2023-08-14 (×2): 10 g
  Filled 2023-08-13 (×2): qty 15

## 2023-08-13 MED ORDER — POTASSIUM CHLORIDE 20 MEQ PO PACK
40.0000 meq | PACK | ORAL | Status: AC
  Administered 2023-08-13 (×2): 40 meq
  Filled 2023-08-13 (×2): qty 2

## 2023-08-13 NOTE — Progress Notes (Addendum)
NAME:  Juan Clarke, MRN:  [id number], DOB:  Jan 22, 1981, LOS: 3 ADMISSION DATE:  08/10/2023, CONSULTATION DATE: 08/10/2023 REFERRING MD: Emergency department physician, CHIEF COMPLAINT: Agitation alcohol withdrawal  History of Present Illness:  13 yoM with PMH of anxiety, polysubstance abuse, ETOH use, HTN, and migraine who presented with AMS, not feeling well for 2 days prior c/o of nausea and abd pain.  Reportedly 7 weeks sober from ETOH. Ethanol 86, ammonia 165, UDS neg. Treated with lactulose enema.  Remained severely agitated in ER despite multiple doses of ativan and haldol, admitted to ICU on precedex and librium taper  Pertinent  Medical History   Past Medical History:  Diagnosis Date   GAD (generalized anxiety disorder)    HTN (hypertension)    Insomnia    Migraine without aura    Overweight    Trigeminal neuralgia    Significant Hospital Events: Including procedures, antibiotic start and stop dates in addition to other pertinent events   12/11 admitted   Interim History / Subjective:  Still on precedex, more awake and alert Febrile to 101.6 Stool output and has FMS  Objective   Blood pressure 126/78, pulse 82, temperature (!) 101.6 F (38.7 C), temperature source Axillary, resp. rate 18, height 5\' 10"  (1.778 m), weight 113.7 kg, SpO2 96%.        Intake/Output Summary (Last 24 hours) at 08/13/2023 1019 Last data filed at 08/13/2023 0644 Gross per 24 hour  Intake 1047.46 ml  Output 2000 ml  Net -952.54 ml   Filed Weights   08/10/23 0734 08/12/23 0500  Weight: 116.1 kg 113.7 kg   Examination: Gen:     Laying flat in bed in posey and wrist restraints, delirious HEENT:  mmm Lungs:    breathing non labored on room air CV:         RRR, no mrg Abd:     Soft, non distended Ext:    No edema Skin:      Warm and dry; no rashes Neuro:   awake, alert, follows commands, impulsive   Labs reviewed K 3.0 Na 149 Cr 0.89 CBGs 140-180  WBC 9.1 Hgb 13 Chronic hep  panel pending  Resolved Hospital Problem list   DM  Assessment & Plan:  Acute metabolic and hepatic encephalopathy secondary to ETOH withdrawal - less agitation this morning, oriented to self, overall clinically improving - cont to wean precedex today continue ciwa with ativan and librium taper.   - PRN haldol, monitor QTc - supportive care/ aspiration precautions.  Continues to protect airway - NGT replaced for cortrak> post pyloric for aspiration precautions.  TF per RD  - ammonia improved.  Reduce lactulose dosing.  - LFTs improving.  Likely alcoholic hepatitis, will follow hepatitis panel.  - thiamine, folate, MVI  Acute on chronic Transaminitis with hyperammoniemia - RUQ limited US c/w fatty liver, no stones or ductal dilation - trend LFTs down trending, t.bili stable - lactulose as above - hepatitis panel in am  Fevers - febrile overnight but WBC, UA, and O2/ lung sounds reassuring.  Likely related to withdrawals.  Continue to monitor clinically off abx - sent blood cultures this am for completeness  NAGMA Mild hyperchloremic acidosis Hypokalemia - stool output still high but improved, reduced lactulose dosing - cont enteral bicarb - continue Free H20 flushes - replete electrolytes aggressively, Mag > 2, K >4 - - trend renal indices  - strict I/Os, daily wts - avoid nephrotoxins, renal dose meds, hemodynamic support as above  Hypertension - holding home lisinopril, prn hydralazine   HLD - hold statin with elevated LFTs, likely resume soon  Depression and anxiety - hold pta paxil, cymbalta, klonopin (PRN)  Hypothyroidism - TSH 8.579 12/11 - synthroid   Best Practice (right click and "Reselect all SmartList Selections" daily)   Diet/type: tubefeeds and NPO DVT prophylaxis LMWH Pressure ulcer(s): N/A GI prophylaxis: N/A Lines: N/A Foley:  N/A Code Status:  full code Last date of multidisciplinary goals of care discussion [mom updated by phone  12/14]   Critical care time:     The patient is critically ill due to encephalopathy.  Critical care was necessary to treat or prevent imminent or life-threatening deterioration.  Critical care was time spent personally by me on the following activities: development of treatment plan with patient and/or surrogate as well as nursing, discussions with consultants, evaluation of patient's response to treatment, examination of patient, obtaining history from patient or surrogate, ordering and performing treatments and interventions, ordering and review of laboratory studies, ordering and review of radiographic studies, pulse oximetry, re-evaluation of patient's condition and participation in multidisciplinary rounds.   Critical Care Time devoted to patient care services described in this note is 32 minutes. This time reflects time of care of this signee Charlott Holler . This critical care time does not reflect separately billable procedures or procedure time, teaching time or supervisory time of PA/NP/Med student/Med Resident etc but could involve care discussion time.       Charlott Holler [facility] 08/13/2023 10:20 AM  Pager: see AMION  If no response to pager , please call critical care on call (see AMION) until 7pm After 7:00 pm call Elink

## 2023-08-13 NOTE — Progress Notes (Signed)
[facility] ADULT ICU REPLACEMENT PROTOCOL   The patient does apply for the [facility] Adult ICU Electrolyte Replacment Protocol based on the criteria listed below:   1.Exclusion criteria: TCTS, ECMO, Dialysis, and Myasthenia Gravis patients 2. Is GFR >/= 30 ml/min? Yes.    Patient's GFR today is >60 3. Is SCr </= 2? Yes.   Patient's SCr is 0.89 mg/dL 4. Did SCr increase >/= 0.5 in 24 hours? No. 5.Pt's weight >40kg  Yes.   6. Abnormal electrolyte(s): K+ =3.0  7. Electrolytes replaced per protocol 8.  Call MD STAT for K+ </= 2.5, Phos </= 1, or Mag </= 1 Physician:  Warrick Parisian, eMD  Faye Ramsay 08/13/2023 5:19 AM

## 2023-08-13 NOTE — Progress Notes (Signed)
Pt increased in agitation following I&O procedure. Pt wanted his PCP to assess the need for I&O, I explained that this was not possible and that his bladder must be drained. Pt very upset, yelling for security and a Clinical research associate. PRN meds given and precedex gtt increased. Discussed situation with pt's wife.

## 2023-08-13 NOTE — Progress Notes (Addendum)
No sitter available at this time, will monitor closely. Wife updated by phone by night shift RN. Pt resting, VSS. Leg restraints D/Cd.

## 2023-08-13 NOTE — Progress Notes (Signed)
eLink Physician-Brief Progress Note Patient Name: Juan Clarke DOB: 08/22/1981 MRN: [id number]   Date of Service  08/13/2023  HPI/Events of Note  42 year old male that initially presented with acute metabolic and hepatic encephalopathy secondary to alcohol withdrawal.  Currently on Precedex infusion with intermittent Haldol.  Mentation has been relatively stable/slowly improving.  Currently receiving every hour neurochecks-seems to increase his agitation with neurochecks.  eICU Interventions  Will reduce the frequency of dedicated neurochecks, still undergoing close observation in the ICU.  No focal deficits have been noted.     Intervention Category Minor Interventions: Routine modifications to care plan (e.g. PRN medications for pain, fever)  Lexa Coronado 08/13/2023, 9:04 PM

## 2023-08-14 DIAGNOSIS — G928 Other toxic encephalopathy: Secondary | ICD-10-CM | POA: Diagnosis not present

## 2023-08-14 DIAGNOSIS — F10931 Alcohol use, unspecified with withdrawal delirium: Secondary | ICD-10-CM | POA: Diagnosis not present

## 2023-08-14 LAB — MAGNESIUM: Magnesium: 2.2 mg/dL (ref 1.7–2.4)

## 2023-08-14 LAB — GLUCOSE, CAPILLARY
Glucose-Capillary: 127 mg/dL — ABNORMAL HIGH (ref 70–99)
Glucose-Capillary: 136 mg/dL — ABNORMAL HIGH (ref 70–99)
Glucose-Capillary: 140 mg/dL — ABNORMAL HIGH (ref 70–99)
Glucose-Capillary: 142 mg/dL — ABNORMAL HIGH (ref 70–99)
Glucose-Capillary: 156 mg/dL — ABNORMAL HIGH (ref 70–99)
Glucose-Capillary: 161 mg/dL — ABNORMAL HIGH (ref 70–99)

## 2023-08-14 LAB — CBC
HCT: 37.5 % — ABNORMAL LOW (ref 39.0–52.0)
Hemoglobin: 12.6 g/dL — ABNORMAL LOW (ref 13.0–17.0)
MCH: 33 pg (ref 26.0–34.0)
MCHC: 33.6 g/dL (ref 30.0–36.0)
MCV: 98.2 fL (ref 80.0–100.0)
Platelets: 100 10*3/uL — ABNORMAL LOW (ref 150–400)
RBC: 3.82 MIL/uL — ABNORMAL LOW (ref 4.22–5.81)
RDW: 15.2 % (ref 11.5–15.5)
WBC: 10.8 10*3/uL — ABNORMAL HIGH (ref 4.0–10.5)
nRBC: 0 % (ref 0.0–0.2)

## 2023-08-14 LAB — RENAL FUNCTION PANEL
Albumin: 3 g/dL — ABNORMAL LOW (ref 3.5–5.0)
Anion gap: 10 (ref 5–15)
BUN: 21 mg/dL — ABNORMAL HIGH (ref 6–20)
CO2: 17 mmol/L — ABNORMAL LOW (ref 22–32)
Calcium: 8.3 mg/dL — ABNORMAL LOW (ref 8.9–10.3)
Chloride: 115 mmol/L — ABNORMAL HIGH (ref 98–111)
Creatinine, Ser: 1.25 mg/dL — ABNORMAL HIGH (ref 0.61–1.24)
GFR, Estimated: >60 mL/min (ref >60)
Glucose, Bld: 146 mg/dL — ABNORMAL HIGH (ref 70–99)
Phosphorus: 3.9 mg/dL (ref 2.5–4.6)
Potassium: 3.5 mmol/L (ref 3.5–5.1)
Sodium: 142 mmol/L (ref 135–145)

## 2023-08-14 MED ORDER — POTASSIUM CHLORIDE 20 MEQ PO PACK
60.0000 meq | PACK | Freq: Once | ORAL | Status: AC
  Administered 2023-08-14: 60 meq
  Filled 2023-08-14: qty 3

## 2023-08-14 NOTE — Progress Notes (Signed)
NAME:  Juan Clarke, MRN:  [id number], DOB:  1980-10-01, LOS: 4 ADMISSION DATE:  08/10/2023, CONSULTATION DATE: 08/10/2023 REFERRING MD: Emergency department physician, CHIEF COMPLAINT: Agitation alcohol withdrawal  History of Present Illness:  76 yoM with PMH of anxiety, polysubstance abuse, ETOH use, HTN, and migraine who presented with AMS, not feeling well for 2 days prior c/o of nausea and abd pain.  Reportedly 7 weeks sober from ETOH. Ethanol 86, ammonia 165, UDS neg. Treated with lactulose enema.  Remained severely agitated in ER despite multiple doses of ativan and haldol, admitted to ICU on precedex and librium taper  Pertinent  Medical History   Past Medical History:  Diagnosis Date   GAD (generalized anxiety disorder)    HTN (hypertension)    Insomnia    Migraine without aura    Overweight    Trigeminal neuralgia    Significant Hospital Events: Including procedures, antibiotic start and stop dates in addition to other pertinent events   12/11 admitted   Interim History / Subjective:  Still on precedex.  Passed swallow. Denies hunger Objective   Blood pressure (!) 145/86, pulse 78, temperature 99.8 F (37.7 C), temperature source Axillary, resp. rate 18, height 5\' 10"  (1.778 m), weight 118.1 kg, SpO2 95%.        Intake/Output Summary (Last 24 hours) at 08/14/2023 1231 Last data filed at 08/14/2023 1200 Gross per 24 hour  Intake 6146.94 ml  Output 2660 ml  Net 3486.94 ml   Filed Weights   08/10/23 0734 08/12/23 0500 08/14/23 0500  Weight: 116.1 kg 113.7 kg 118.1 kg   Examination: Gen:      Delirious, diaphoretic, in posey belt HEENT:  dry mucus membranes Lungs:  room air breathing non labored, no wheeze CV:         RRR Abd:      + bowel sounds; soft, non-tender; no palpable masses, no distension Ext:    No edema Skin:      Warm and dry; no rashes Neuro:   delirious, impulsive, awakens to voice, follows commands but does not follow conversation or speak  appropriately.    Labs reviewed Na 142 K 3.5 Cl 115 Cr 1.25  Chronic hep panel non reactive  Resolved Hospital Problem list   DM  Assessment & Plan:  Acute metabolic and hepatic encephalopathy secondary to ETOH withdrawal - cont to wean precedex today continue ciwa with ativan and librium taper.  Did not start phenobarb due to transaminitis - PRN haldol, monitor QTc - supportive care/ aspiration precautions.  Continues to protect airway - NGT replaced for cortrak> post pyloric for aspiration precautions.  TF per RD  - ammonia improved.  Stop lactulose  - thiamine, folate, MVI  Acute on chronic Transaminitis with hyperammoniemia - RUQ limited US c/w fatty liver, no stones or ductal dilation - trend LFTs down trending, t.bili stable - lfts in am  Fevers - febrile overnight but WBC, UA, and O2/ lung sounds reassuring.  Likely related to withdrawals.  Continue to monitor clinically off abx - blood cultures ngtd  NAGMA Mild hyperchloremic acidosis Hypokalemia AKI - stop lactulose - cont enteral bicarb - continue Free H20 flushes - replete electrolytes aggressively, Mag > 2, K >4 - trend renal indices  - strict I/Os, daily wts - avoid nephrotoxins, renal dose meds, hemodynamic support as above - aki likely from fever/insensible losses. Encourage enteral hydration since he is passing swallow.   Hypertension - holding home lisinopril, prn hydralazine   HLD -  hold statin with elevated LFTs, likely resume soon  Depression and anxiety - hold pta paxil, cymbalta, klonopin (PRN)  Hypothyroidism - TSH 8.579 12/11 - synthroid   Best Practice (right click and "Reselect all SmartList Selections" daily)   Diet/type: tubefeeds and NPO DVT prophylaxis LMWH Pressure ulcer(s): N/A GI prophylaxis: N/A Lines: N/A Foley:  N/A Code Status:  full code Last date of multidisciplinary goals of care discussion [mom updated by phone 12/14]   Critical care time:     The  patient is critically ill due to encephalopathy, alcohol withdrawal with deliriu.  Critical care was necessary to treat or prevent imminent or life-threatening deterioration.  Critical care was time spent personally by me on the following activities: development of treatment plan with patient and/or surrogate as well as nursing, discussions with consultants, evaluation of patient's response to treatment, examination of patient, obtaining history from patient or surrogate, ordering and performing treatments and interventions, ordering and review of laboratory studies, ordering and review of radiographic studies, pulse oximetry, re-evaluation of patient's condition and participation in multidisciplinary rounds.   Critical Care Time devoted to patient care services described in this note is 38 minutes. This time reflects time of care of this signee Charlott Holler . This critical care time does not reflect separately billable procedures or procedure time, teaching time or supervisory time of PA/NP/Med student/Med Resident etc but could involve care discussion time.       Charlott Holler [facility] 08/14/2023 12:42 PM  Pager: see AMION  If no response to pager , please call critical care on call (see AMION) until 7pm After 7:00 pm call Elink

## 2023-08-14 NOTE — Progress Notes (Signed)
E-link notified that patient passed bedside swallow screen.

## 2023-08-14 NOTE — Plan of Care (Signed)
Patient admitted s/p AMS event. Q2 CIWA scoring performed per order. No PRNs required, able to titrate precedex throughout shift. Patient able to be redirected during patient care, encouraged to eat with regular diet. Patient and family updated in plan of care. ICU status maintained.   Problem: Safety: Goal: Non-violent Restraint(s) Outcome: Progressing   Problem: Education: Goal: Knowledge of General Education information will improve Description: Including pain rating scale, medication(s)/side effects and non-pharmacologic comfort measures Outcome: Progressing   Problem: Health Behavior/Discharge Planning: Goal: Ability to manage health-related needs will improve Outcome: Progressing   Problem: Clinical Measurements: Goal: Ability to maintain clinical measurements within normal limits will improve Outcome: Progressing Goal: Will remain free from infection Outcome: Progressing Goal: Diagnostic test results will improve Outcome: Progressing Goal: Respiratory complications will improve Outcome: Progressing Goal: Cardiovascular complication will be avoided Outcome: Progressing   Problem: Activity: Goal: Risk for activity intolerance will decrease Outcome: Progressing   Problem: Nutrition: Goal: Adequate nutrition will be maintained Outcome: Progressing   Problem: Coping: Goal: Level of anxiety will decrease Outcome: Progressing   Problem: Elimination: Goal: Will not experience complications related to bowel motility Outcome: Progressing Goal: Will not experience complications related to urinary retention Outcome: Progressing   Problem: Pain Management: Goal: General experience of comfort will improve Outcome: Progressing   Problem: Safety: Goal: Ability to remain free from injury will improve Outcome: Progressing   Problem: Skin Integrity: Goal: Risk for impaired skin integrity will decrease Outcome: Progressing

## 2023-08-15 DIAGNOSIS — F10931 Alcohol use, unspecified with withdrawal delirium: Secondary | ICD-10-CM | POA: Diagnosis not present

## 2023-08-15 DIAGNOSIS — E876 Hypokalemia: Secondary | ICD-10-CM | POA: Diagnosis not present

## 2023-08-15 DIAGNOSIS — G928 Other toxic encephalopathy: Secondary | ICD-10-CM | POA: Diagnosis not present

## 2023-08-15 LAB — HEPATIC FUNCTION PANEL
ALT: 36 U/L (ref 0–44)
AST: 36 U/L (ref 15–41)
Albumin: 2.9 g/dL — ABNORMAL LOW (ref 3.5–5.0)
Alkaline Phosphatase: 49 U/L (ref 38–126)
Bilirubin, Direct: 0.2 mg/dL (ref 0.0–0.2)
Indirect Bilirubin: 0.6 mg/dL (ref 0.3–0.9)
Total Bilirubin: 0.8 mg/dL (ref <1.2)
Total Protein: 5.8 g/dL — ABNORMAL LOW (ref 6.5–8.1)

## 2023-08-15 LAB — GLUCOSE, CAPILLARY
Glucose-Capillary: 117 mg/dL — ABNORMAL HIGH (ref 70–99)
Glucose-Capillary: 118 mg/dL — ABNORMAL HIGH (ref 70–99)
Glucose-Capillary: 122 mg/dL — ABNORMAL HIGH (ref 70–99)
Glucose-Capillary: 124 mg/dL — ABNORMAL HIGH (ref 70–99)
Glucose-Capillary: 128 mg/dL — ABNORMAL HIGH (ref 70–99)
Glucose-Capillary: 147 mg/dL — ABNORMAL HIGH (ref 70–99)

## 2023-08-15 LAB — RENAL FUNCTION PANEL
Albumin: 3 g/dL — ABNORMAL LOW (ref 3.5–5.0)
Anion gap: 9 (ref 5–15)
BUN: 15 mg/dL (ref 6–20)
CO2: 19 mmol/L — ABNORMAL LOW (ref 22–32)
Calcium: 8.2 mg/dL — ABNORMAL LOW (ref 8.9–10.3)
Chloride: 108 mmol/L (ref 98–111)
Creatinine, Ser: 0.74 mg/dL (ref 0.61–1.24)
GFR, Estimated: >60 mL/min (ref >60)
Glucose, Bld: 151 mg/dL — ABNORMAL HIGH (ref 70–99)
Phosphorus: 4.5 mg/dL (ref 2.5–4.6)
Potassium: 3.1 mmol/L — ABNORMAL LOW (ref 3.5–5.1)
Sodium: 136 mmol/L (ref 135–145)

## 2023-08-15 LAB — CBC
HCT: 36.8 % — ABNORMAL LOW (ref 39.0–52.0)
Hemoglobin: 12.4 g/dL — ABNORMAL LOW (ref 13.0–17.0)
MCH: 32.7 pg (ref 26.0–34.0)
MCHC: 33.7 g/dL (ref 30.0–36.0)
MCV: 97.1 fL (ref 80.0–100.0)
Platelets: 121 10*3/uL — ABNORMAL LOW (ref 150–400)
RBC: 3.79 MIL/uL — ABNORMAL LOW (ref 4.22–5.81)
RDW: 15 % (ref 11.5–15.5)
WBC: 11 10*3/uL — ABNORMAL HIGH (ref 4.0–10.5)
nRBC: 0.2 % (ref 0.0–0.2)

## 2023-08-15 MED ORDER — ENSURE ENLIVE PO LIQD
237.0000 mL | Freq: Three times a day (TID) | ORAL | Status: DC
  Administered 2023-08-16 (×2): 237 mL via ORAL

## 2023-08-15 MED ORDER — ACETAMINOPHEN 650 MG RE SUPP: 650.0000 mg | Freq: Four times a day (QID) | RECTAL | Status: DC | PRN

## 2023-08-15 MED ORDER — CLONAZEPAM 0.5 MG PO TABS
0.5000 mg | ORAL_TABLET | Freq: Two times a day (BID) | ORAL | Status: DC | PRN
  Administered 2023-08-15 – 2023-08-17 (×3): 0.5 mg via ORAL
  Filled 2023-08-15 (×3): qty 1

## 2023-08-15 MED ORDER — SODIUM BICARBONATE 650 MG PO TABS
650.0000 mg | ORAL_TABLET | Freq: Two times a day (BID) | ORAL | Status: DC
  Administered 2023-08-15 – 2023-08-16 (×4): 650 mg via ORAL
  Filled 2023-08-15 (×4): qty 1

## 2023-08-15 MED ORDER — ONDANSETRON HCL 4 MG/2ML IJ SOLN
4.0000 mg | Freq: Four times a day (QID) | INTRAMUSCULAR | Status: DC | PRN
  Administered 2023-08-15: 4 mg via INTRAVENOUS
  Filled 2023-08-15: qty 2

## 2023-08-15 MED ORDER — PAROXETINE HCL 20 MG PO TABS
20.0000 mg | ORAL_TABLET | Freq: Every day | ORAL | Status: DC
  Administered 2023-08-15 – 2023-08-16 (×2): 20 mg via ORAL
  Filled 2023-08-15 (×3): qty 1

## 2023-08-15 MED ORDER — DULOXETINE HCL 60 MG PO CPEP
60.0000 mg | ORAL_CAPSULE | Freq: Every day | ORAL | Status: DC
  Administered 2023-08-15 – 2023-08-16 (×2): 60 mg via ORAL
  Filled 2023-08-15 (×2): qty 1

## 2023-08-15 MED ORDER — CALCIUM CARBONATE ANTACID 500 MG PO CHEW
1.0000 | CHEWABLE_TABLET | Freq: Two times a day (BID) | ORAL | Status: DC | PRN
  Administered 2023-08-15 – 2023-08-16 (×2): 200 mg via ORAL
  Filled 2023-08-15 (×4): qty 1

## 2023-08-15 MED ORDER — SENNOSIDES-DOCUSATE SODIUM 8.6-50 MG PO TABS: 1.0000 | ORAL_TABLET | Freq: Every evening | ORAL | Status: DC | PRN

## 2023-08-15 MED ORDER — ALLOPURINOL 300 MG PO TABS
300.0000 mg | ORAL_TABLET | Freq: Every day | ORAL | Status: DC
  Administered 2023-08-15 – 2023-08-16 (×2): 300 mg via ORAL
  Filled 2023-08-15 (×2): qty 1

## 2023-08-15 MED ORDER — POTASSIUM CHLORIDE 20 MEQ PO PACK
20.0000 meq | PACK | ORAL | Status: AC
  Administered 2023-08-15 (×2): 20 meq
  Filled 2023-08-15 (×2): qty 1

## 2023-08-15 MED ORDER — ONDANSETRON HCL 4 MG PO TABS: 4.0000 mg | ORAL_TABLET | Freq: Four times a day (QID) | ORAL | Status: DC | PRN

## 2023-08-15 MED ORDER — LEVOTHYROXINE SODIUM 50 MCG PO TABS
50.0000 ug | ORAL_TABLET | Freq: Every day | ORAL | Status: DC
  Administered 2023-08-15 – 2023-08-17 (×3): 50 ug via ORAL
  Filled 2023-08-15 (×3): qty 1

## 2023-08-15 MED ORDER — ADULT MULTIVITAMIN W/MINERALS CH
1.0000 | ORAL_TABLET | Freq: Every day | ORAL | Status: DC
  Administered 2023-08-15 – 2023-08-16 (×2): 1 via ORAL
  Filled 2023-08-15 (×2): qty 1

## 2023-08-15 MED ORDER — FOLIC ACID 1 MG PO TABS
1.0000 mg | ORAL_TABLET | Freq: Every day | ORAL | Status: DC
  Administered 2023-08-15 – 2023-08-16 (×2): 1 mg via ORAL
  Filled 2023-08-15 (×2): qty 1

## 2023-08-15 MED ORDER — PANTOPRAZOLE SODIUM 40 MG PO TBEC
40.0000 mg | DELAYED_RELEASE_TABLET | Freq: Every day | ORAL | Status: DC
  Administered 2023-08-15 – 2023-08-16 (×2): 40 mg via ORAL
  Filled 2023-08-15 (×2): qty 1

## 2023-08-15 MED ORDER — POTASSIUM CHLORIDE 10 MEQ/100ML IV SOLN
10.0000 meq | INTRAVENOUS | Status: AC
  Administered 2023-08-15 (×3): 10 meq via INTRAVENOUS
  Filled 2023-08-15 (×3): qty 100

## 2023-08-15 MED ORDER — CLONIDINE HCL 0.1 MG PO TABS
0.3000 mg | ORAL_TABLET | Freq: Four times a day (QID) | ORAL | Status: DC
  Administered 2023-08-15 (×2): 0.3 mg
  Filled 2023-08-15 (×2): qty 1

## 2023-08-15 MED ORDER — CHLORDIAZEPOXIDE HCL 25 MG PO CAPS
25.0000 mg | ORAL_CAPSULE | Freq: Two times a day (BID) | ORAL | Status: AC
Start: 2023-08-15 — End: 2023-08-15
  Administered 2023-08-15 (×2): 25 mg via ORAL
  Filled 2023-08-15 (×2): qty 1

## 2023-08-15 MED ORDER — ATORVASTATIN CALCIUM 10 MG PO TABS
10.0000 mg | ORAL_TABLET | Freq: Every day | ORAL | Status: DC
  Administered 2023-08-16: 10 mg via ORAL
  Filled 2023-08-15: qty 1

## 2023-08-15 MED ORDER — ACETAMINOPHEN 325 MG PO TABS: 650.0000 mg | ORAL_TABLET | Freq: Four times a day (QID) | ORAL | Status: DC | PRN

## 2023-08-15 NOTE — Progress Notes (Signed)
[facility] ADULT ICU REPLACEMENT PROTOCOL   The patient does apply for the [facility] Adult ICU Electrolyte Replacment Protocol based on the criteria listed below:   1.Exclusion criteria: TCTS, ECMO, Dialysis, and Myasthenia Gravis patients 2. Is GFR >/= 30 ml/min? Yes.    Patient's GFR today is >60 3. Is SCr </= 2? Yes.   Patient's SCr is 0.74 mg/dL 4. Did SCr increase >/= 0.5 in 24 hours? No. 5.Pt's weight >40kg  Yes.   6. Abnormal electrolyte(s): K+ 3.1  7. Electrolytes replaced per protocol 8.  Call MD STAT for K+ </= 2.5, Phos </= 1, or Mag </= 1 Physician:  Dr. Loralyn Freshwater, Lilia Argue 08/15/2023 4:17 AM

## 2023-08-15 NOTE — Progress Notes (Signed)
eLink Physician-Brief Progress Note Patient Name: Juan Clarke DOB: [birth year] MRN: [id number]   Date of Service  08/15/2023  HPI/Events of Note  Complaining of reflux  eICU Interventions  Start PPI with first dose now, as needed Tums     Intervention Category Minor Interventions: Routine modifications to care plan (e.g. PRN medications for pain, fever)  Marialy Urbanczyk 08/15/2023, 12:14 AM

## 2023-08-15 NOTE — Plan of Care (Signed)
Patient admitted s/p AMS event. Q2 CIWA performed per order. Patient able to follow commands appropriately, restraint order discontinued. Tube feeds discontinued, patient encouraged to increased PO intake. X1 Emesis episode, prn Zofran given, patient expressed relief. Precedex gtt in place. Patient and family updated in plan of care. ICU status maintained.   Problem: Safety: Goal: Non-violent Restraint(s) Outcome: Progressing   Problem: Education: Goal: Knowledge of General Education information will improve Description: Including pain rating scale, medication(s)/side effects and non-pharmacologic comfort measures Outcome: Progressing   Problem: Health Behavior/Discharge Planning: Goal: Ability to manage health-related needs will improve Outcome: Progressing   Problem: Clinical Measurements: Goal: Ability to maintain clinical measurements within normal limits will improve Outcome: Progressing Goal: Will remain free from infection Outcome: Progressing Goal: Diagnostic test results will improve Outcome: Progressing Goal: Respiratory complications will improve Outcome: Progressing Goal: Cardiovascular complication will be avoided Outcome: Progressing   Problem: Activity: Goal: Risk for activity intolerance will decrease Outcome: Progressing   Problem: Nutrition: Goal: Adequate nutrition will be maintained Outcome: Progressing   Problem: Coping: Goal: Level of anxiety will decrease Outcome: Progressing   Problem: Elimination: Goal: Will not experience complications related to bowel motility Outcome: Progressing Goal: Will not experience complications related to urinary retention Outcome: Progressing   Problem: Pain Management: Goal: General experience of comfort will improve Outcome: Progressing   Problem: Safety: Goal: Ability to remain free from injury will improve Outcome: Progressing   Problem: Skin Integrity: Goal: Risk for impaired skin integrity will  decrease Outcome: Progressing

## 2023-08-15 NOTE — Progress Notes (Addendum)
Nutrition Follow-up  DOCUMENTATION CODES:   Obesity unspecified  INTERVENTION:   Ensure Enlive po TID, each supplement provides 350 kcal and 20 grams of protein. Continue MVI with minerals daily. D/C TF. Recommend RN remove Cortrak tube.  NUTRITION DIAGNOSIS:   Inadequate oral intake related to lethargy/confusion as evidenced by meal completion < 50%.  Ongoing   GOAL:   Patient will meet greater than or equal to 90% of their needs  Progressing   MONITOR:   PO intake, Supplement acceptance  REASON FOR ASSESSMENT:   Consult Enteral/tube feeding initiation and management  ASSESSMENT:   Pt with hx of HTN, EtOH and drug abuse presented to ED with AMS. Found to have elevated LFTS, EtOH levels, and ammonia.  Discussed patient in ICU rounds and with RN today. Patient passed RN swallow screen and diet was advanced to regular on 12/15. Patient is consuming 25% of meals. He reports that he is not very hungry, but is thirsty. He has been eating foods with high water content, such as fresh fruits. He agreed to drinking Ensure supplements between meals to increase intake of protein and calories. He tried and likes the chocolate flavored Ensure. Patient hopes to go home tomorrow.   Cortrak remains in place, but TF is off.   Labs reviewed. K 3.1 CBG: [phone]  Medications reviewed and include folic acid, MVI with minerals, protonix, KCl, thiamine, precedex (weaning).  Admit weight 116.1 kg Current weight 121.5 kg  Diet Order:   Diet Order             Diet regular Room service appropriate? Yes; Fluid consistency: Thin  Diet effective now                   EDUCATION NEEDS:   Not appropriate for education at this time  Skin:  Skin Assessment: Reviewed RN Assessment  Last BM:  12/16 type 7  Height:   Ht Readings from Last 1 Encounters:  08/10/23 5\' 10"  (1.778 m)    Weight:   Wt Readings from Last 1 Encounters:  08/15/23 121.5 kg    Ideal Body Weight:   75.5 kg  BMI:  Body mass index is 38.43 kg/m.  Estimated Nutritional Needs:   Kcal:  2300-2500 kcal/d  Protein:  110-135g/d  Fluid:  2.3-2.5L/d   Gabriel Rainwater RD, LDN, CNSC Please refer to Amion for contact information.

## 2023-08-15 NOTE — Progress Notes (Signed)
   NAME:  Juan Clarke, MRN:  [id number], DOB:  Jun 29, 1981, LOS: 5 ADMISSION DATE:  08/10/2023, CONSULTATION DATE: 08/10/2023 REFERRING MD: Emergency department physician, CHIEF COMPLAINT: Agitation alcohol withdrawal  History of Present Illness:  13 yoM with PMH of anxiety, polysubstance abuse, ETOH use, HTN, and migraine who presented with AMS, not feeling well for 2 days prior c/o of nausea and abd pain.  Reportedly 7 weeks sober from ETOH. Ethanol 86, ammonia 165, UDS neg. Treated with lactulose enema.  Remained severely agitated in ER despite multiple doses of ativan and haldol, admitted to ICU on precedex and librium taper  Pertinent  Medical History   Past Medical History:  Diagnosis Date   GAD (generalized anxiety disorder)    HTN (hypertension)    Insomnia    Migraine without aura    Overweight    Trigeminal neuralgia    Significant Hospital Events: Including procedures, antibiotic start and stop dates in addition to other pertinent events   12/11 admitted   Interim History / Subjective:  On Precedex 1.9. Wants to go home, gets emotional at times.  Objective   Blood pressure (!) 147/92, pulse 70, temperature 98.7 F (37.1 C), temperature source Axillary, resp. rate 16, height 5\' 10"  (1.778 m), weight 121.5 kg, SpO2 95%.        Intake/Output Summary (Last 24 hours) at 08/15/2023 1001 Last data filed at 08/15/2023 0900 Gross per 24 hour  Intake 4523.43 ml  Output 1300 ml  Net 3223.43 ml   Filed Weights   08/12/23 0500 08/14/23 0500 08/15/23 0500  Weight: 113.7 kg 118.1 kg 121.5 kg   Examination: General: Adult male, sitting up in bed, in NAD. Neuro: A&O x 3, no deficits. HEENT: [place]/AT. Sclerae anicteric. EOMI. Cardiovascular: RRR, no M/R/G.  Lungs: Respirations even and unlabored.  CTA bilaterally, No W/R/R. Abdomen: BS x 4, soft, NT/ND.  Musculoskeletal: No gross deformities, no edema.  Skin: Intact, warm, no rashes.  Assessment & Plan:   Acute  metabolic and hepatic encephalopathy secondary to ETOH withdrawal - Cont precedex, start weaning - Add Clonidine and taper daily - Continue PTA Klonopin (he is specifically asking for this) - PRN haldol, monitor QTc - supportive care/ aspiration precautions.  - thiamine, folate, MVI  Acute on chronic Transaminitis with hyperammoniemia - RUQ limited US c/w fatty liver, no stones or ductal dilation. - Monitor LFT's intermittently.  NAGMA - improving Hypokalemia - s/p repletion - cont enteral bicarb - continue Free H20 flushes - replete electrolytes aggressively, Mag > 2, K >4 - trend renal indices  - strict I/Os, daily wts  Hypertension - holding home lisinopril, prn hydralazine   HLD - hold PTA lipitor today, can resume tomorrow 12/17  Depression and anxiety - resume pta paxil, cymbalta, klonopin (PRN)  Hypothyroidism - TSH 8.579 12/11 - cont PTA synthroid   Best Practice (right click and "Reselect all SmartList Selections" daily)   Diet/type: Regular consistency (see orders) DVT prophylaxis LMWH Pressure ulcer(s): N/A GI prophylaxis: N/A Lines: N/A Foley:  N/A Code Status:  full code Last date of multidisciplinary goals of care discussion [mom updated by phone 12/14]  Critical care time: 30 min     Rutherford Guys, PA - [facility] For pager details, please see AMION or use Epic chat  After 1900, please call [facility] for cross coverage needs 08/15/2023, 10:17 AM

## 2023-08-16 DIAGNOSIS — F10931 Alcohol use, unspecified with withdrawal delirium: Secondary | ICD-10-CM | POA: Diagnosis not present

## 2023-08-16 LAB — MAGNESIUM: Magnesium: 2 mg/dL (ref 1.7–2.4)

## 2023-08-16 LAB — BASIC METABOLIC PANEL
Anion gap: 7 (ref 5–15)
BUN: 10 mg/dL (ref 6–20)
CO2: 25 mmol/L (ref 22–32)
Calcium: 8.2 mg/dL — ABNORMAL LOW (ref 8.9–10.3)
Chloride: 103 mmol/L (ref 98–111)
Creatinine, Ser: 0.83 mg/dL (ref 0.61–1.24)
GFR, Estimated: >60 mL/min (ref >60)
Glucose, Bld: 108 mg/dL — ABNORMAL HIGH (ref 70–99)
Potassium: 3.2 mmol/L — ABNORMAL LOW (ref 3.5–5.1)
Sodium: 135 mmol/L (ref 135–145)

## 2023-08-16 LAB — GLUCOSE, CAPILLARY
Glucose-Capillary: 93 mg/dL (ref 70–99)
Glucose-Capillary: 97 mg/dL (ref 70–99)
Glucose-Capillary: 109 mg/dL — ABNORMAL HIGH (ref 70–99)
Glucose-Capillary: 97 mg/dL (ref 70–99)
Glucose-Capillary: 100 mg/dL — ABNORMAL HIGH (ref 70–99)

## 2023-08-16 LAB — PHOSPHORUS: Phosphorus: 4 mg/dL (ref 2.5–4.6)

## 2023-08-16 MED ORDER — LORAZEPAM 2 MG/ML IJ SOLN
0.0000 mg | INTRAMUSCULAR | Status: DC
  Administered 2023-08-16: 1 mg via INTRAVENOUS
  Filled 2023-08-16: qty 1

## 2023-08-16 MED ORDER — POTASSIUM CHLORIDE CRYS ER 20 MEQ PO TBCR
60.0000 meq | EXTENDED_RELEASE_TABLET | Freq: Once | ORAL | Status: AC
  Administered 2023-08-16: 60 meq via ORAL

## 2023-08-16 MED ORDER — LORAZEPAM 2 MG/ML IJ SOLN: 0.0000 mg | Freq: Three times a day (TID) | INTRAMUSCULAR | Status: DC

## 2023-08-16 MED ORDER — CLONIDINE HCL 0.1 MG PO TABS
0.1000 mg | ORAL_TABLET | Freq: Four times a day (QID) | ORAL | Status: DC
Start: 2023-08-17 — End: 2023-08-18

## 2023-08-16 MED ORDER — POTASSIUM CHLORIDE 10 MEQ/100ML IV SOLN
10.0000 meq | INTRAVENOUS | Status: DC
  Administered 2023-08-16: 10 meq via INTRAVENOUS
  Filled 2023-08-16 (×4): qty 100

## 2023-08-16 MED ORDER — CLONIDINE HCL 0.1 MG PO TABS
0.2000 mg | ORAL_TABLET | Freq: Four times a day (QID) | ORAL | Status: AC
  Administered 2023-08-16 – 2023-08-17 (×4): 0.2 mg via ORAL
  Filled 2023-08-16 (×4): qty 2

## 2023-08-16 MED ORDER — CLONIDINE HCL 0.2 MG PO TABS
0.3000 mg | ORAL_TABLET | Freq: Four times a day (QID) | ORAL | Status: DC
  Administered 2023-08-16: 0.3 mg via ORAL
  Filled 2023-08-16: qty 1

## 2023-08-16 MED ORDER — LACTULOSE 10 GM/15ML PO SOLN
10.0000 g | Freq: Every day | ORAL | Status: DC
  Administered 2023-08-16: 10 g via ORAL
  Filled 2023-08-16: qty 15

## 2023-08-16 MED ORDER — POTASSIUM CHLORIDE CRYS ER 20 MEQ PO TBCR
20.0000 meq | EXTENDED_RELEASE_TABLET | ORAL | Status: DC
  Administered 2023-08-16: 20 meq via ORAL
  Filled 2023-08-16 (×2): qty 1

## 2023-08-16 MED ORDER — LORAZEPAM 1 MG PO TABS: 1.0000 mg | ORAL_TABLET | ORAL | Status: DC | PRN

## 2023-08-16 MED ORDER — LORAZEPAM 2 MG/ML IJ SOLN
1.0000 mg | INTRAMUSCULAR | Status: DC | PRN
  Administered 2023-08-16: 1 mg via INTRAVENOUS
  Filled 2023-08-16: qty 1

## 2023-08-16 NOTE — Progress Notes (Addendum)
NAME:  Juan Clarke, MRN:  [id number], DOB:  [birth year], LOS: 6 ADMISSION DATE:  08/10/2023, CONSULTATION DATE: 08/10/2023 REFERRING MD: Emergency department physician, CHIEF COMPLAINT: Agitation alcohol withdrawal  History of Present Illness:  86 yoM with PMH of anxiety, polysubstance abuse, ETOH use, HTN, and migraine who presented with AMS, not feeling well for 2 days prior c/o of nausea and abd pain.  Reportedly 7 weeks sober from ETOH. Ethanol 86, ammonia 165, UDS neg. Treated with lactulose enema.  Remained severely agitated in ER despite multiple doses of ativan and haldol, admitted to ICU on precedex and librium taper  Pertinent  Medical History   Past Medical History:  Diagnosis Date   GAD (generalized anxiety disorder)    HTN (hypertension)    Insomnia    Migraine without aura    Overweight    Trigeminal neuralgia    Significant Hospital Events: Including procedures, antibiotic start and stop dates in addition to other pertinent events   12/11 admitted  12/17 transfer out of ICU Interim History / Subjective:  Doing well. Precedex off  Objective   Blood pressure (!) 140/75, pulse 77, temperature 98.5 F (36.9 C), temperature source Oral, resp. rate 17, height 5\' 10"  (1.778 m), weight 121.5 kg, SpO2 94%.        Intake/Output Summary (Last 24 hours) at 08/16/2023 0922 Last data filed at 08/16/2023 0800 Gross per 24 hour  Intake 1841.64 ml  Output 1180 ml  Net 661.64 ml   Filed Weights   08/12/23 0500 08/14/23 0500 08/15/23 0500  Weight: 113.7 kg 118.1 kg 121.5 kg   Examination: General: Adult male, sitting up in bed watching TV, in NAD. Neuro: A&O x 3, no deficits. HEENT: [place]/AT. Sclerae anicteric. EOMI. Cardiovascular: RRR, no M/R/G.  Lungs: Respirations even and unlabored.  CTA bilaterally, No W/R/R. Abdomen: BS x 4, soft, NT/ND.  Musculoskeletal: No gross deformities, no edema.  Skin: Intact, warm, no rashes.  Assessment & Plan:   Acute metabolic  and hepatic encephalopathy secondary to ETOH withdrawal - resolved Family request for outpatient substance abuse assistance - Continue Clonidine and start taper today through 12/19 and off - Continue PTA Klonopin (he is specifically asking for this) - PRN haldol, monitor QTc - supportive care/ aspiration precautions.  - thiamine, folate, MVI - TOC   Acute on chronic Transaminitis with hyperammoniemia - RUQ limited US c/w fatty liver, no stones or ductal dilation. - Repeat LFT's tomorrow - Start Lactulose 10mg  daily (had been on 10mg  BID but stopped due to excessive diarrhea)  NAGMA - improved, felt to be 2/2 diarrhea Hypokalemia - felt to be 2/2 diarrhea - cont enteral bicarb today, can likely stop 12/18 - Additional K PO (can d/c IV) - replete electrolytes aggressively, Mag > 2, K >4 - trend renal indices  - strict I/Os, daily wts  Hx HTN, HLD - prn hydralazine  - holding home lisinopril, Lipitor for now  Depression and anxiety - resume pta paxil, cymbalta, klonopin (PRN)  Hypothyroidism - TSH 8.579 12/11 - cont PTA synthroid   Stable for transfer out of ICU. Will ask [facility] to pickup 12/18. Can likely d/c 12/19 or 12/20.   Best Practice (right click and "Reselect all SmartList Selections" daily)   Diet/type: Regular consistency (see orders) DVT prophylaxis LMWH Pressure ulcer(s): N/A GI prophylaxis: N/A Lines: N/A Foley:  N/A Code Status:  full code Last date of multidisciplinary goals of care discussion [mom updated by phone 12/14]   Rutherford Guys, PA -  [facility] For pager details, please see AMION or use Epic chat  After 1900, please call [facility] for cross coverage needs 08/16/2023, 9:22 AM

## 2023-08-16 NOTE — Progress Notes (Signed)
Pt anxious and tearful. Pt reports domestic issues of potential divorce. Pt wanting to be discharged this evening. Notified Dr. Denese Killings. MD reported pt remains too tachycardic for discharge. Notified pt.

## 2023-08-16 NOTE — Evaluation (Signed)
Physical Therapy Evaluation Patient Details Name: Juan Clarke MRN: [id number] DOB: July 15, 1981 Today's Date: 08/16/2023  History of Present Illness  Patient is a 42 y/o male admitted 08/10/23 with AMS, nausea, abd pain, agitation in ER and placed on precedex and librium taper with ETOH withdrawal.  PMH positive for anxiety, polysubstance abuse, ETOH use, HTN, migraine, trigeminal neuralgia and obesity.  Clinical Impression  Patient presents with mobility close to functional baseline.  Scored 22/24 on DGI and mobilizing independently.  Discussed slow return to activities and fall prevention in the home.  No further skilled PT needs, will sign off.         If plan is discharge home, recommend the following:     Can travel by private vehicle        Equipment Recommendations None recommended by PT  Recommendations for Other Services       Functional Status Assessment Patient has had a recent decline in their functional status and demonstrates the ability to make significant improvements in function in a reasonable and predictable amount of time.     Precautions / Restrictions Precautions Precautions: None Restrictions Weight Bearing Restrictions Per Provider Order: No      Mobility  Bed Mobility               General bed mobility comments: up in recliner    Transfers Overall transfer level: Independent                      Ambulation/Gait Ambulation/Gait assistance: Independent Gait Distance (Feet): 400 Feet Assistive device: None Gait Pattern/deviations: WFL(Within Functional Limits)          Stairs Stairs: Yes Stairs assistance: Modified independent (Device/Increase time) Stair Management: One rail Left, Alternating pattern, Forwards Number of Stairs: 10    Wheelchair Mobility     Tilt Bed    Modified Rankin (Stroke Patients Only)       Balance Overall balance assessment: Independent, Modified Independent                                Standardized Balance Assessment Standardized Balance Assessment : Dynamic Gait Index   Dynamic Gait Index Level Surface: Normal Change in Gait Speed: Mild Impairment Gait with Horizontal Head Turns: Normal Gait with Vertical Head Turns: Normal Gait and Pivot Turn: Normal Step Over Obstacle: Normal Step Around Obstacles: Normal Steps: Mild Impairment Total Score: 22       Pertinent Vitals/Pain Pain Assessment Pain Assessment: No/denies pain    Home Living Family/patient expects to be discharged to:: Private residence Living Arrangements: Spouse/significant other;Children Available Help at Discharge: Family Type of Home: House       Alternate Level Stairs-Number of Steps: spilt level Home Layout: Multi-level Home Equipment: None      Prior Function Prior Level of Function : Independent/Modified Independent             Mobility Comments: 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       Extremity/Trunk Assessment   Upper Extremity Assessment Upper Extremity Assessment: Overall WFL for tasks assessed    Lower Extremity Assessment Lower Extremity Assessment: Overall WFL for tasks assessed       Communication   Communication Communication: No apparent difficulties  Cognition Arousal: Alert Behavior During Therapy: WFL for tasks assessed/performed Overall Cognitive Status: Within Functional Limits for tasks assessed  General Comments General comments (skin integrity, edema, etc.): Educated on safety with rising slowly, clear pathways, lights on when up and using non-skid surface in shower.  Also discussed balancing rest with activity with slow progression to normal activity.    Exercises     Assessment/Plan    PT Assessment Patient does not need any further PT services  PT Problem List         PT Treatment Interventions      PT Goals (Current goals can  be found in the Care Plan section)  Acute Rehab PT Goals PT Goal Formulation: All assessment and education complete, DC therapy    Frequency       Co-evaluation               AM-PAC PT "6 Clicks" Mobility  Outcome Measure Help needed turning from your back to your side while in a flat bed without using bedrails?: None Help needed moving from lying on your back to sitting on the side of a flat bed without using bedrails?: None Help needed moving to and from a bed to a chair (including a wheelchair)?: None Help needed standing up from a chair using your arms (e.g., wheelchair or bedside chair)?: None Help needed to walk in hospital room?: None Help needed climbing 3-5 steps with a railing? : None 6 Click Score: 24    End of Session Equipment Utilized During Treatment: Gait belt Activity Tolerance: Patient tolerated treatment well Patient left: in chair   PT Visit Diagnosis: Muscle weakness (generalized) (M62.81)    Time: 7829-5621 PT Time Calculation (min) (ACUTE ONLY): 17 min   Charges:   PT Evaluation $PT Eval Low Complexity: 1 Low   PT General Charges $$ ACUTE PT VISIT: 1 Visit         Sheran Lawless, PT Acute Rehabilitation Services Office:[phone] 08/16/2023   Juan Clarke 08/16/2023, 10:58 AM

## 2023-08-16 NOTE — Progress Notes (Signed)
[facility] ADULT ICU REPLACEMENT PROTOCOL   The patient does apply for the [facility] Adult ICU Electrolyte Replacment Protocol based on the criteria listed below:   1.Exclusion criteria: TCTS, ECMO, Dialysis, and Myasthenia Gravis patients 2. Is GFR >/= 30 ml/min? Yes.    Patient's GFR today is >60 3. Is SCr </= 2? Yes.   Patient's SCr is 0.33 mg/dL 4. Did SCr increase >/= 0.5 in 24 hours? No. 5.Pt's weight >40kg  Yes.   6. Abnormal electrolyte(s): K+ 3.2  7. Electrolytes replaced per protocol 8.  Call MD STAT for K+ </= 2.5, Phos </= 1, or Mag </= 1 Physician:  Dr.Paliwal  Juan Clarke, Juan Clarke 08/16/2023 5:57 AM

## 2023-08-16 NOTE — Progress Notes (Signed)
Per CIWA, gave 1 mg Ativan IV. Documented in pyxis of wasting 1.5 mg from the vial of 2 mg but gave pt 1 mg per order. Notified pharmacist and no discrepancies in pyxis but to document via note.

## 2023-08-17 DIAGNOSIS — K7200 Acute and subacute hepatic failure without coma: Secondary | ICD-10-CM | POA: Diagnosis not present

## 2023-08-17 DIAGNOSIS — G928 Other toxic encephalopathy: Secondary | ICD-10-CM | POA: Diagnosis not present

## 2023-08-17 DIAGNOSIS — F10931 Alcohol use, unspecified with withdrawal delirium: Secondary | ICD-10-CM | POA: Diagnosis not present

## 2023-08-17 DIAGNOSIS — K7682 Hepatic encephalopathy: Secondary | ICD-10-CM | POA: Diagnosis not present

## 2023-08-17 LAB — HEPATIC FUNCTION PANEL
ALT: 68 U/L — ABNORMAL HIGH (ref 0–44)
AST: 62 U/L — ABNORMAL HIGH (ref 15–41)
Albumin: 3 g/dL — ABNORMAL LOW (ref 3.5–5.0)
Alkaline Phosphatase: 64 U/L (ref 38–126)
Bilirubin, Direct: 0.3 mg/dL — ABNORMAL HIGH (ref 0.0–0.2)
Indirect Bilirubin: 0.5 mg/dL (ref 0.3–0.9)
Total Bilirubin: 0.8 mg/dL (ref <1.2)
Total Protein: 5.9 g/dL — ABNORMAL LOW (ref 6.5–8.1)

## 2023-08-17 LAB — BASIC METABOLIC PANEL
Anion gap: 14 (ref 5–15)
BUN: 10 mg/dL (ref 6–20)
CO2: 22 mmol/L (ref 22–32)
Calcium: 8.7 mg/dL — ABNORMAL LOW (ref 8.9–10.3)
Chloride: 100 mmol/L (ref 98–111)
Creatinine, Ser: 0.84 mg/dL (ref 0.61–1.24)
GFR, Estimated: >60 mL/min (ref >60)
Glucose, Bld: 98 mg/dL (ref 70–99)
Potassium: 3 mmol/L — ABNORMAL LOW (ref 3.5–5.1)
Sodium: 136 mmol/L (ref 135–145)

## 2023-08-17 MED ORDER — POTASSIUM CHLORIDE 20 MEQ PO PACK: 40.0000 meq | PACK | Freq: Two times a day (BID) | ORAL | Status: DC

## 2023-08-17 NOTE — Plan of Care (Signed)
CIWA score remains <5 overnight. Pt did ambulate in the hallway multiple time throughout the night.   Pt report feeling anxious regarding his wife telling him that she wants a divorce yesterday. Provided therapeutic listening and reassurance.    Problem: Safety: Goal: Non-violent Restraint(s) Outcome: Progressing   Problem: Education: Goal: Knowledge of General Education information will improve Description: Including pain rating scale, medication(s)/side effects and non-pharmacologic comfort measures Outcome: Progressing   Problem: Health Behavior/Discharge Planning: Goal: Ability to manage health-related needs will improve Outcome: Progressing   Problem: Clinical Measurements: Goal: Will remain free from infection Outcome: Progressing   Problem: Activity: Goal: Risk for activity intolerance will decrease Outcome: Progressing   Problem: Nutrition: Goal: Adequate nutrition will be maintained Outcome: Progressing   Problem: Coping: Goal: Level of anxiety will decrease Outcome: Progressing   Problem: Elimination: Goal: Will not experience complications related to bowel motility Outcome: Progressing   Problem: Safety: Goal: Ability to remain free from injury will improve Outcome: Progressing

## 2023-08-17 NOTE — Discharge Summary (Signed)
Physician Discharge Summary  Juan Clarke UVO:[id number] DOB: [birth year] DOA: 08/10/2023  PCP: Sigmund Hazel, MD  Admit date: 08/10/2023 Discharge date: 08/17/2023  Admitted From: Home Disposition:  Home  Recommendations for Outpatient Follow-up:  Follow up with PCP in 1-2 weeks Please obtain BMP/CBC in one week   Home Health:No Equipment/Devices:None  Discharge Condition:Stable CODE STATUS:Full Diet recommendation: Heart Healthy    Brief/Interim Summary:  42 y.o. male past medical history of alcohol abuse essential hypertension comes in with alcohol withdrawal requiring Precedex infusion admitted under PCCM also started on Librium.  Now transferred to [facility] on 08/17/2023   Discharge Diagnoses:  Principal Problem:   Toxic metabolic encephalopathy Active Problems:   Altered mental status   Acute metabolic encephalopathy   Acute liver failure without hepatic coma   Alcohol withdrawal syndrome, with delirium (HCC)   Acute hepatic encephalopathy (HCC)   Hypokalemia  Toxic metabolic encephalopathy/ Acute metabolic encephalopathy Initially started on Precedex drip now transition to Klonopin, and a clonidine taper. Also started on thiamine and folate and IV fluids. Tachycardia has improved. He was transition to oral Ativan which she did not required overnight. He was discharged in stable condition.    Acute liver failure without hepatic coma/elevated transaminitis: Right upper quadrant ultrasound showed fatty liver no dilated common bile duct or signs of cholecystitis. Lactulose was stopped is likely due to alcohol abuse.   Normal anion gap metabolic acidosis likely due to diarrhea: Started on bicarb orally now stopped. Try to potassium greater than 4 magnesium greater than 2. Strict I's and O's and daily weights.   Hypokalemia Replete orally recheck in the morning.   Central hypertension: Lisinopril was held on admission he was started on clonidine. He has  remained relatively stable he will resume antihypertensive medication as an outpatient.  Depression anxiety: Continue Cymbalta Paxil and Klonopin (Klonopin as needed).   Hypothyroidism: Continue Synthroid.  Discharge Instructions  Discharge Instructions     Diet - low sodium heart healthy   Complete by: As directed    Increase activity slowly   Complete by: As directed       Allergies as of 08/17/2023       Reactions   Lexapro [escitalopram Oxalate] Other (See Comments)   Lack of therapeutic effect    Zoloft [sertraline Hcl] Other (See Comments)   Lack of therapeutic effect         Medication List     STOP taking these medications    meloxicam 15 MG tablet Commonly known as: MOBIC       TAKE these medications    acamprosate 333 MG tablet Commonly known as: CAMPRAL Take 2 tablets by mouth 3 (three) times daily.   allopurinol 300 MG tablet Commonly known as: ZYLOPRIM Take 300 mg by mouth daily.   atorvastatin 10 MG tablet Commonly known as: LIPITOR Take 10 mg by mouth daily.   clonazePAM 0.5 MG tablet Commonly known as: KLONOPIN Take 0.5 mg by mouth 2 (two) times daily as needed for anxiety.   DULoxetine 60 MG capsule Commonly known as: CYMBALTA Take 60 mg by mouth daily.   levothyroxine 50 MCG tablet Commonly known as: SYNTHROID Take 50 mcg by mouth daily before breakfast.   lisdexamfetamine 30 MG capsule Commonly known as: VYVANSE Take 30 mg by mouth daily.   lisinopril-hydrochlorothiazide 20-25 MG tablet Commonly known as: ZESTORETIC Take 1 tablet by mouth daily.   ondansetron 8 MG disintegrating tablet Commonly known as: Zofran ODT 8mg  ODT q8 hours prn  nausea What changed:  how much to take how to take this when to take this reasons to take this additional instructions   PARoxetine 20 MG tablet Commonly known as: PAXIL Take 20 mg by mouth daily.        Allergies  Allergen Reactions   Lexapro [Escitalopram Oxalate] Other  (See Comments)    Lack of therapeutic effect    Zoloft [Sertraline Hcl] Other (See Comments)    Lack of therapeutic effect     Consultations: Pulmonary critical care   Procedures/Studies: DG Abd Portable 1V Result Date: 08/12/2023 CLINICAL DATA:  Feeding tube placement EXAM: PORTABLE ABDOMEN - 1 VIEW COMPARISON:  None Available. FINDINGS: The curvature of the feeding tube favors the tip being at the fourth portion of the duodenum. Coiling in the stomach is substantially less likely. The lung bases appear clear. Right hip prosthesis partially included. Bowel gas pattern unremarkable. IMPRESSION: 1. The curvature of the feeding tube favors the tip being at the fourth portion of the duodenum. Electronically Signed   By: Gaylyn Rong M.D.   On: 08/12/2023 11:25   DG Chest Port 1 View Result Date: 08/11/2023 CLINICAL DATA:  42 year old male history of acute respiratory failure. EXAM: PORTABLE CHEST 1 VIEW COMPARISON:  Chest x-ray 05/13/2015. FINDINGS: Images under penetrated, limiting the diagnostic sensitivity and specificity of the examination. There is what appears to be a nasogastric tube, however, the tip of the tube can not be accurately visualized secondary to the under penetration of the image. Lung volumes are low. No consolidative airspace disease. No pleural effusions. No pneumothorax. No pulmonary nodule or mass noted. Pulmonary vasculature and the cardiomediastinal silhouette are within normal limits. IMPRESSION: 1. Limited study demonstrating low lung volumes and no definite radiographic evidence of acute cardiopulmonary disease. Electronically Signed   By: Trudie Reed M.D.   On: 08/11/2023 05:49   DG Abd 1 View Result Date: 08/10/2023 CLINICAL DATA:  Check gastric catheter placement EXAM: ABDOMEN - 1 VIEW COMPARISON:  None Available. FINDINGS: Gastric catheter is noted coiled within the stomach. Cardiac shadow is within normal limits. Lungs show mild vascular congestion. No  focal infiltrate is seen. IMPRESSION: Gastric catheter within the stomach. Electronically Signed   By: Alcide Clever M.D.   On: 08/10/2023 23:11   US Abdomen Limited RUQ (LIVER/GB) Result Date: 08/10/2023 CLINICAL DATA:  Elevated AST EXAM: ULTRASOUND ABDOMEN LIMITED RIGHT UPPER QUADRANT COMPARISON:  None Available. FINDINGS: Gallbladder: No gallstones or wall thickening visualized. No sonographic Murphy sign noted by sonographer. Common bile duct: Diameter: 4 mm Liver: Echogenic hepatic parenchyma consistent with fatty infiltration. With this level of echogenicity evaluation for underlying mass lesion is limited and if needed follow-up contrast CT or MRI as clinically appropriate. Portal vein is patent on color Doppler imaging with normal direction of blood flow towards the liver. Other: None. IMPRESSION: Fatty liver infiltration. Distended gallbladder.  No stones or ductal dilatation Electronically Signed   By: Karen Kays M.D.   On: 08/10/2023 15:30   (Echo, Carotid, EGD, Colonoscopy, ERCP)    Subjective: No complaints  Discharge Exam: Vitals:   08/17/23 0158 08/17/23 0502  BP:  (!) 146/83  Pulse:  (!) 101  Resp: 18 19  Temp:  98.6 F (37 C)  SpO2:  98%   Vitals:   08/16/23 2237 08/17/23 0158 08/17/23 0500 08/17/23 0502  BP: (!) 148/82   (!) 146/83  Pulse: (!) 102   (!) 101  Resp:  18  19  Temp:  98.6 F (37 C)  TempSrc:    Oral  SpO2:    98%  Weight:   120.2 kg   Height:        General: Pt is alert, awake, not in acute distress Cardiovascular: RRR, S1/S2 +, no rubs, no gallops Respiratory: CTA bilaterally, no wheezing, no rhonchi Abdominal: Soft, NT, ND, bowel sounds + Extremities: no edema, no cyanosis    The results of significant diagnostics from this hospitalization (including imaging, microbiology, ancillary and laboratory) are listed below for reference.     Microbiology: Recent Results (from the past 240 hours)  MRSA Next Gen by PCR, Nasal     Status: None    Collection Time: 08/10/23  6:54 PM   Specimen: Nasal Mucosa; Nasal Swab  Result Value Ref Range Status   MRSA by PCR Next Gen NOT DETECTED NOT DETECTED Final    Comment: (NOTE) The GeneXpert MRSA Assay (FDA approved for NASAL specimens only), is one component of a comprehensive MRSA colonization surveillance program. It is not intended to diagnose MRSA infection nor to guide or monitor treatment for MRSA infections. Test performance is not FDA approved in patients less than 74 years old. Performed at [facility], [address] [place], [place] [address]   Culture, blood (Routine X 2) w Reflex to ID Panel     Status: None (Preliminary result)   Collection Time: 08/13/23  9:27 AM   Specimen: BLOOD LEFT ARM  Result Value Ref Range Status   Specimen Description BLOOD LEFT ARM  Final   Special Requests   Final    BOTTLES DRAWN AEROBIC AND ANAEROBIC Blood Culture results may not be optimal due to an inadequate volume of blood received in culture bottles   Culture   Final    NO GROWTH 3 DAYS Performed at [facility], [address], [place], [place] [address]    Report Status PENDING  Incomplete  Culture, blood (Routine X 2) w Reflex to ID Panel     Status: None (Preliminary result)   Collection Time: 08/13/23  9:27 AM   Specimen: BLOOD LEFT HAND  Result Value Ref Range Status   Specimen Description BLOOD LEFT HAND  Final   Special Requests   Final    BOTTLES DRAWN AEROBIC AND ANAEROBIC Blood Culture results may not be optimal due to an inadequate volume of blood received in culture bottles   Culture   Final    NO GROWTH 3 DAYS Performed at [facility], [address] [place], [place] [address]    Report Status PENDING  Incomplete     Labs: BNP (last 3 results) No results for input(s): "BNP" in the last 8760 hours. Basic Metabolic Panel: Recent Labs  Lab 08/10/23 1310 08/11/23 0826 08/12/23 0555 08/12/23 0556 08/13/23 0341 08/14/23 0639  08/15/23 0238 08/16/23 0433  NA  --    < >  --  145 140 142 136 135  K  --    < >  --  3.1* 3.0* 3.5 3.1* 3.2*  CL  --    < >  --  116* 117* 115* 108 103  CO2  --    < >  --  18* 19* 17* 19* 25  GLUCOSE  --    < >  --  137* 145* 146* 151* 108*  BUN  --    < >  --  17 17 21* 15 10  CREATININE  --    < >  --  0.98 0.89 1.25* 0.74 0.83  CALCIUM  --    < >  --  8.7* 8.0* 8.3* 8.2* 8.2*  MG 2.0  --  1.8  --  2.3 2.2  --  2.0  PHOS 6.2*  --   --  3.6 3.7 3.9 4.5 4.0   < > = values in this interval not displayed.   Liver Function Tests: Recent Labs  Lab 08/10/23 0805 08/11/23 0826 08/12/23 0555 08/12/23 0556 08/13/23 0341 08/14/23 0639 08/15/23 0237 08/15/23 0238  AST 167* 135* 76*  --   --   --  36  --   ALT 113* 83* 56*  --   --   --  36  --   ALKPHOS 86 79 62  --   --   --  49  --   BILITOT 1.2* 1.3* 1.1  --   --   --  0.8  --   PROT 6.4* 6.2* 6.0*  --   --   --  5.8*  --   ALBUMIN 4.2 4.0 3.6 3.5 3.2* 3.0* 2.9* 3.0*   No results for input(s): "LIPASE", "AMYLASE" in the last 168 hours. Recent Labs  Lab 08/10/23 0805 08/12/23 0555  AMMONIA 165* 51*   CBC: Recent Labs  Lab 08/10/23 0805 08/10/23 0810 08/12/23 0555 08/13/23 0341 08/14/23 0639 08/15/23 0237  WBC 4.4  --  8.7 9.1 10.8* 11.0*  HGB 15.6 15.6 13.0 13.0 12.6* 12.4*  HCT 44.1 46.0 37.4* 37.9* 37.5* 36.8*  MCV 92.8  --  94.7 95.5 98.2 97.1  PLT 187  --  115* 111* 100* 121*   Cardiac Enzymes: No results for input(s): "CKTOTAL", "CKMB", "CKMBINDEX", "TROPONINI" in the last 168 hours. BNP: Invalid input(s): "POCBNP" CBG: Recent Labs  Lab 08/16/23 0342 08/16/23 0755 08/16/23 1140 08/16/23 1706 08/16/23 2109  GLUCAP 93 97 109* 97 100*   D-Dimer No results for input(s): "DDIMER" in the last 72 hours. Hgb A1c No results for input(s): "HGBA1C" in the last 72 hours. Lipid Profile No results for input(s): "CHOL", "HDL", "LDLCALC", "TRIG", "CHOLHDL", "LDLDIRECT" in the last 72 hours. Thyroid function  studies No results for input(s): "TSH", "T4TOTAL", "T3FREE", "THYROIDAB" in the last 72 hours.  Invalid input(s): "FREET3" Anemia work up No results for input(s): "VITAMINB12", "FOLATE", "FERRITIN", "TIBC", "IRON", "RETICCTPCT" in the last 72 hours. Urinalysis    Component Value Date/Time   COLORURINE YELLOW 08/11/2023 1609   APPEARANCEUR CLEAR 08/11/2023 1609   LABSPEC 1.027 08/11/2023 1609   PHURINE 5.0 08/11/2023 1609   GLUCOSEU NEGATIVE 08/11/2023 1609   HGBUR SMALL (A) 08/11/2023 1609   BILIRUBINUR NEGATIVE 08/11/2023 1609   KETONESUR NEGATIVE 08/11/2023 1609   PROTEINUR 30 (A) 08/11/2023 1609   NITRITE NEGATIVE 08/11/2023 1609   LEUKOCYTESUR NEGATIVE 08/11/2023 1609   Sepsis Labs Recent Labs  Lab 08/12/23 0555 08/13/23 0341 08/14/23 0639 08/15/23 0237  WBC 8.7 9.1 10.8* 11.0*   Microbiology Recent Results (from the past 240 hours)  MRSA Next Gen by PCR, Nasal     Status: None   Collection Time: 08/10/23  6:54 PM   Specimen: Nasal Mucosa; Nasal Swab  Result Value Ref Range Status   MRSA by PCR Next Gen NOT DETECTED NOT DETECTED Final    Comment: (NOTE) The GeneXpert MRSA Assay (FDA approved for NASAL specimens only), is one component of a comprehensive MRSA colonization surveillance program. It is not intended to diagnose MRSA infection nor to guide or monitor treatment for MRSA infections. Test performance is  not FDA approved in patients less than 60 years old. Performed at [facility], [address], [place], [place] [address]   Culture, blood (Routine X 2) w Reflex to ID Panel     Status: None (Preliminary result)   Collection Time: 08/13/23  9:27 AM   Specimen: BLOOD LEFT ARM  Result Value Ref Range Status   Specimen Description BLOOD LEFT ARM  Final   Special Requests   Final    BOTTLES DRAWN AEROBIC AND ANAEROBIC Blood Culture results may not be optimal due to an inadequate volume of blood received in culture bottles   Culture   Final    NO  GROWTH 3 DAYS Performed at [facility], [address] [place], [place] [address]    Report Status PENDING  Incomplete  Culture, blood (Routine X 2) w Reflex to ID Panel     Status: None (Preliminary result)   Collection Time: 08/13/23  9:27 AM   Specimen: BLOOD LEFT HAND  Result Value Ref Range Status   Specimen Description BLOOD LEFT HAND  Final   Special Requests   Final    BOTTLES DRAWN AEROBIC AND ANAEROBIC Blood Culture results may not be optimal due to an inadequate volume of blood received in culture bottles   Culture   Final    NO GROWTH 3 DAYS Performed at [facility], [address], [place], [place] [address]    Report Status PENDING  Incomplete     Time coordinating discharge: Over 35 minutes  SIGNED:   Marinda Elk, MD  [facility] 08/17/2023, 7:01 AM Pager   If 7PM-7AM, please contact night-coverage [email or web address] Password [email or web address]

## 2023-08-18 LAB — CULTURE, BLOOD (ROUTINE X 2)
Culture: NO GROWTH
Culture: NO GROWTH

## 2024-01-17 ENCOUNTER — Other Ambulatory Visit: Payer: Self-pay

## 2024-01-17 ENCOUNTER — Encounter (HOSPITAL_COMMUNITY): Payer: Self-pay | Admitting: Emergency Medicine

## 2024-01-17 ENCOUNTER — Emergency Department (HOSPITAL_COMMUNITY)

## 2024-01-17 ENCOUNTER — Inpatient Hospital Stay (HOSPITAL_COMMUNITY)
Admission: EM | Admit: 2024-01-17 | Discharge: 2024-01-30 | DRG: 896 | Disposition: A | Discharge status: Home / Self Care | Attending: Internal Medicine | Admitting: Internal Medicine

## 2024-01-17 DIAGNOSIS — Z8249 Family history of ischemic heart disease and other diseases of the circulatory system: Secondary | ICD-10-CM

## 2024-01-17 DIAGNOSIS — G9341 Metabolic encephalopathy: Secondary | ICD-10-CM | POA: Diagnosis present

## 2024-01-17 DIAGNOSIS — T83098A Other mechanical complication of other indwelling urethral catheter, initial encounter: Secondary | ICD-10-CM | POA: Diagnosis not present

## 2024-01-17 DIAGNOSIS — R7989 Other specified abnormal findings of blood chemistry: Secondary | ICD-10-CM

## 2024-01-17 DIAGNOSIS — Z888 Allergy status to other drugs, medicaments and biological substances status: Secondary | ICD-10-CM

## 2024-01-17 DIAGNOSIS — R319 Hematuria, unspecified: Secondary | ICD-10-CM | POA: Diagnosis not present

## 2024-01-17 DIAGNOSIS — E872 Acidosis, unspecified: Secondary | ICD-10-CM | POA: Diagnosis present

## 2024-01-17 DIAGNOSIS — E7220 Disorder of urea cycle metabolism, unspecified: Secondary | ICD-10-CM

## 2024-01-17 DIAGNOSIS — E782 Mixed hyperlipidemia: Secondary | ICD-10-CM | POA: Diagnosis present

## 2024-01-17 DIAGNOSIS — T421X5A Adverse effect of iminostilbenes, initial encounter: Secondary | ICD-10-CM | POA: Diagnosis present

## 2024-01-17 DIAGNOSIS — F10231 Alcohol dependence with withdrawal delirium: Principal | ICD-10-CM | POA: Diagnosis present

## 2024-01-17 DIAGNOSIS — T43215A Adverse effect of selective serotonin and norepinephrine reuptake inhibitors, initial encounter: Secondary | ICD-10-CM | POA: Diagnosis present

## 2024-01-17 DIAGNOSIS — F10931 Alcohol use, unspecified with withdrawal delirium: Secondary | ICD-10-CM | POA: Diagnosis not present

## 2024-01-17 DIAGNOSIS — R6510 Systemic inflammatory response syndrome (SIRS) of non-infectious origin without acute organ dysfunction: Secondary | ICD-10-CM

## 2024-01-17 DIAGNOSIS — G9081 Serotonin syndrome: Secondary | ICD-10-CM

## 2024-01-17 DIAGNOSIS — Z79890 Hormone replacement therapy (postmenopausal): Secondary | ICD-10-CM

## 2024-01-17 DIAGNOSIS — J9601 Acute respiratory failure with hypoxia: Secondary | ICD-10-CM | POA: Diagnosis present

## 2024-01-17 DIAGNOSIS — R700 Elevated erythrocyte sedimentation rate: Secondary | ICD-10-CM | POA: Diagnosis present

## 2024-01-17 DIAGNOSIS — Z79899 Other long term (current) drug therapy: Secondary | ICD-10-CM

## 2024-01-17 DIAGNOSIS — K567 Ileus, unspecified: Secondary | ICD-10-CM | POA: Diagnosis not present

## 2024-01-17 DIAGNOSIS — T40415A Adverse effect of fentanyl or fentanyl analogs, initial encounter: Secondary | ICD-10-CM | POA: Diagnosis not present

## 2024-01-17 DIAGNOSIS — E66811 Obesity, class 1: Secondary | ICD-10-CM | POA: Diagnosis present

## 2024-01-17 DIAGNOSIS — M1A00X0 Idiopathic chronic gout, unspecified site, without tophus (tophi): Secondary | ICD-10-CM | POA: Diagnosis present

## 2024-01-17 DIAGNOSIS — J690 Pneumonitis due to inhalation of food and vomit: Secondary | ICD-10-CM | POA: Diagnosis present

## 2024-01-17 DIAGNOSIS — T483X5A Adverse effect of antitussives, initial encounter: Secondary | ICD-10-CM | POA: Diagnosis present

## 2024-01-17 DIAGNOSIS — E876 Hypokalemia: Secondary | ICD-10-CM | POA: Diagnosis not present

## 2024-01-17 DIAGNOSIS — Z6831 Body mass index (BMI) 31.0-31.9, adult: Secondary | ICD-10-CM

## 2024-01-17 DIAGNOSIS — I10 Essential (primary) hypertension: Secondary | ICD-10-CM | POA: Diagnosis present

## 2024-01-17 DIAGNOSIS — Z781 Physical restraint status: Secondary | ICD-10-CM

## 2024-01-17 DIAGNOSIS — E039 Hypothyroidism, unspecified: Secondary | ICD-10-CM | POA: Diagnosis present

## 2024-01-17 DIAGNOSIS — T43225A Adverse effect of selective serotonin reuptake inhibitors, initial encounter: Secondary | ICD-10-CM | POA: Diagnosis present

## 2024-01-17 LAB — COMPREHENSIVE METABOLIC PANEL WITH GFR
ALT: 149 U/L — ABNORMAL HIGH (ref 0–44)
AST: 89 U/L — ABNORMAL HIGH (ref 15–41)
Albumin: 5 g/dL (ref 3.5–5.0)
Alkaline Phosphatase: 116 U/L (ref 38–126)
Anion gap: 12 (ref 5–15)
BUN: 12 mg/dL (ref 6–20)
CO2: 24 mmol/L (ref 22–32)
Calcium: 9.2 mg/dL (ref 8.9–10.3)
Chloride: 101 mmol/L (ref 98–111)
Creatinine, Ser: 0.92 mg/dL (ref 0.61–1.24)
GFR, Estimated: >60 mL/min (ref >60)
Glucose, Bld: 156 mg/dL — ABNORMAL HIGH (ref 70–99)
Potassium: 3.7 mmol/L (ref 3.5–5.1)
Sodium: 137 mmol/L (ref 135–145)
Total Bilirubin: 0.8 mg/dL (ref 0.0–1.2)
Total Protein: 7.9 g/dL (ref 6.5–8.1)

## 2024-01-17 LAB — CBC WITH DIFFERENTIAL/PLATELET
Abs Immature Granulocytes: 0.04 10*3/uL (ref 0.00–0.07)
Basophils Absolute: 0.1 10*3/uL (ref 0.0–0.1)
Basophils Relative: 1 %
Eosinophils Absolute: 0 10*3/uL (ref 0.0–0.5)
Eosinophils Relative: 0 %
HCT: 48.9 % (ref 39.0–52.0)
Hemoglobin: 17.1 g/dL — ABNORMAL HIGH (ref 13.0–17.0)
Immature Granulocytes: 1 %
Lymphocytes Relative: 19 %
Lymphs Abs: 1.5 10*3/uL (ref 0.7–4.0)
MCH: 29.9 pg (ref 26.0–34.0)
MCHC: 35 g/dL (ref 30.0–36.0)
MCV: 85.6 fL (ref 80.0–100.0)
Monocytes Absolute: 0.5 10*3/uL (ref 0.1–1.0)
Monocytes Relative: 6 %
Neutro Abs: 5.5 10*3/uL (ref 1.7–7.7)
Neutrophils Relative %: 73 %
Platelets: 281 10*3/uL (ref 150–400)
RBC: 5.71 MIL/uL (ref 4.22–5.81)
RDW: 13.2 % (ref 11.5–15.5)
WBC: 7.5 10*3/uL (ref 4.0–10.5)
nRBC: 0 % (ref 0.0–0.2)

## 2024-01-17 LAB — ETHANOL: Alcohol, Ethyl (B): 32 mg/dL — ABNORMAL HIGH (ref <15)

## 2024-01-17 LAB — RAPID URINE DRUG SCREEN, HOSP PERFORMED
Amphetamines: NOT DETECTED
Barbiturates: NOT DETECTED
Benzodiazepines: NOT DETECTED
Cocaine: NOT DETECTED
Opiates: NOT DETECTED
Tetrahydrocannabinol: NOT DETECTED

## 2024-01-17 LAB — SALICYLATE LEVEL: Salicylate Lvl: <7 mg/dL — ABNORMAL LOW (ref 7.0–30.0)

## 2024-01-17 LAB — I-STAT CG4 LACTIC ACID, ED: Lactic Acid, Venous: 4 mmol/L (ref 0.5–1.9)

## 2024-01-17 LAB — CBG MONITORING, ED: Glucose-Capillary: 152 mg/dL — ABNORMAL HIGH (ref 70–99)

## 2024-01-17 LAB — ACETAMINOPHEN LEVEL: Acetaminophen (Tylenol), Serum: <10 ug/mL — ABNORMAL LOW (ref 10–30)

## 2024-01-17 LAB — MAGNESIUM: Magnesium: 2.2 mg/dL (ref 1.7–2.4)

## 2024-01-17 MED ORDER — SODIUM CHLORIDE 0.9 % IV BOLUS: 1000.0000 mL | Freq: Once | INTRAVENOUS | Status: DC

## 2024-01-17 MED ORDER — LORAZEPAM 1 MG PO TABS: 0.0000 mg | ORAL_TABLET | Freq: Two times a day (BID) | ORAL | Status: DC

## 2024-01-17 MED ORDER — SODIUM CHLORIDE 0.9 % IV SOLN
2.0000 g | Freq: Once | INTRAVENOUS | Status: AC
  Administered 2024-01-18: 2 g via INTRAVENOUS
  Filled 2024-01-17: qty 12.5

## 2024-01-17 MED ORDER — LORAZEPAM 2 MG/ML IJ SOLN
0.0000 mg | Freq: Four times a day (QID) | INTRAMUSCULAR | Status: DC
  Administered 2024-01-17: 4 mg via INTRAVENOUS
  Filled 2024-01-17: qty 2

## 2024-01-17 MED ORDER — VANCOMYCIN HCL IN DEXTROSE 1-5 GM/200ML-% IV SOLN: 1000.0000 mg | Freq: Once | INTRAVENOUS | Status: DC

## 2024-01-17 MED ORDER — LACTATED RINGERS IV BOLUS
2000.0000 mL | Freq: Once | INTRAVENOUS | Status: AC
  Administered 2024-01-17: 2000 mL via INTRAVENOUS

## 2024-01-17 MED ORDER — LORAZEPAM 2 MG/ML IJ SOLN: 0.0000 mg | Freq: Two times a day (BID) | INTRAMUSCULAR | Status: DC

## 2024-01-17 MED ORDER — THIAMINE HCL 100 MG/ML IJ SOLN
100.0000 mg | Freq: Every day | INTRAMUSCULAR | Status: DC
  Administered 2024-01-19 – 2024-01-26 (×6): 100 mg via INTRAVENOUS
  Filled 2024-01-17 (×6): qty 2

## 2024-01-17 MED ORDER — LORAZEPAM 2 MG/ML IJ SOLN
2.0000 mg | Freq: Once | INTRAMUSCULAR | Status: AC
  Administered 2024-01-17: 2 mg via INTRAVENOUS
  Filled 2024-01-17: qty 1

## 2024-01-17 MED ORDER — METRONIDAZOLE 500 MG/100ML IV SOLN
500.0000 mg | Freq: Once | INTRAVENOUS | Status: AC
  Administered 2024-01-18: 500 mg via INTRAVENOUS
  Filled 2024-01-17: qty 100

## 2024-01-17 MED ORDER — VANCOMYCIN HCL IN DEXTROSE 1-5 GM/200ML-% IV SOLN
1000.0000 mg | Freq: Once | INTRAVENOUS | Status: AC
  Administered 2024-01-18: 1000 mg via INTRAVENOUS
  Filled 2024-01-17: qty 200

## 2024-01-17 MED ORDER — THIAMINE MONONITRATE 100 MG PO TABS
100.0000 mg | ORAL_TABLET | Freq: Every day | ORAL | Status: DC
  Administered 2024-01-18 – 2024-01-30 (×7): 100 mg via ORAL
  Filled 2024-01-17 (×7): qty 1

## 2024-01-17 MED ORDER — LORAZEPAM 1 MG PO TABS: 0.0000 mg | ORAL_TABLET | Freq: Four times a day (QID) | ORAL | Status: DC

## 2024-01-17 NOTE — ED Provider Notes (Signed)
    Eldon Greenland, MD 01/17/24 [phone]

## 2024-01-17 NOTE — ED Provider Notes (Signed)
 [place] EMERGENCY DEPARTMENT AT [facility] Provider Note   CSN: [id number] Arrival date & time: 01/17/24  2224     History  Chief Complaint  Patient presents with   Altered Mental Status    Juan Clarke is a 43 y.o. male history of EtOH abuse, hypertension, hepatic encephalopathy here for evaluation of altered mental status.  Earlier today.  Mother states she spoke with him on the phone at around 4:00 and he said he needed help.  He has been in and out of rehab previously, last stent in December.  Has had multiple relapses since.  Mother stated he had a Binger this weekend.  His last drink was actually about a day and a half ago.  He arrives altered, tremulous, diaphoretic.   Level 5 caveat-altered mental status  Mother- [name] in room  HPI     Home Medications Prior to Admission medications   Medication Sig Start Date End Date Taking? Authorizing Provider  acamprosate (CAMPRAL) 333 MG tablet Take 2 tablets by mouth 3 (three) times daily.    [provider]  allopurinol  (ZYLOPRIM ) 300 MG tablet Take 300 mg by mouth daily.    [provider]  atorvastatin  (LIPITOR) 10 MG tablet Take 10 mg by mouth daily. 03/08/19   [provider]  clonazePAM  (KLONOPIN ) 0.5 MG tablet Take 0.5 mg by mouth 2 (two) times daily as needed for anxiety.  06/07/14   [provider]  DULoxetine  (CYMBALTA ) 60 MG capsule Take 60 mg by mouth daily. 07/27/23   [provider]  levothyroxine  (SYNTHROID ) 50 MCG tablet Take 50 mcg by mouth daily before breakfast. 01/03/19   [provider]  lisdexamfetamine (VYVANSE) 30 MG capsule Take 30 mg by mouth daily.    [provider]  lisinopril -hydrochlorothiazide (ZESTORETIC) 20-25 MG tablet Take 1 tablet by mouth daily. 12/15/18   [provider]  ondansetron  (ZOFRAN  ODT) 8 MG disintegrating tablet 8mg  ODT q8 hours prn nausea Patient taking differently: Take 8 mg by mouth every 8  (eight) hours as needed for nausea. 03/13/19   Palumbo, April, MD  PARoxetine  (PAXIL ) 20 MG tablet Take 20 mg by mouth daily. 02/10/19   [provider]      Allergies    Lexapro [escitalopram oxalate] and Zoloft [sertraline hcl]    Review of Systems   Review of Systems  Unable to perform ROS: Mental status change    Physical Exam Updated Vital Signs BP 122/80   Pulse (!) 102   Temp 98.7 F (37.1 C)   Resp (!) 25   Ht 5\' 10"  (1.778 m)   Wt 120.2 kg   SpO2 97%   BMI 38.02 kg/m  Physical Exam Constitutional:      General: He is in acute distress.     Appearance: He is obese. He is ill-appearing and diaphoretic.  HENT:     Head: Normocephalic.     Jaw: There is normal jaw occlusion.     Nose: Nose normal.     Mouth/Throat:     Mouth: Mucous membranes are dry.  Eyes:     Extraocular Movements:     Right eye: Nystagmus present.     Left eye: Nystagmus present.     Conjunctiva/sclera:     Right eye: Right conjunctiva is injected.     Left eye: Left conjunctiva is injected.  Neck:     Trachea: Trachea and phonation normal.     Comments: Full ROM Cardiovascular:  Rate and Rhythm: Tachycardia present.     Pulses: Normal pulses.          Radial pulses are 2+ on the right side and 2+ on the left side.       Dorsalis pedis pulses are 2+ on the right side and 2+ on the left side.     Heart sounds: Normal heart sounds.  Pulmonary:     Effort: Pulmonary effort is normal.     Breath sounds: Normal breath sounds and air entry.  Abdominal:     General: Bowel sounds are normal.     Palpations: Abdomen is soft.     Tenderness: There is no abdominal tenderness. There is no right CVA tenderness, guarding or rebound.     Hernia: No hernia is present.     Comments: Soft non tender  Musculoskeletal:        General: Normal range of motion.     Cervical back: Full passive range of motion without pain and normal range of motion.     Comments: Spontaneously moves all  extremities  Skin:    General: Skin is warm.     Capillary Refill: Capillary refill takes less than 2 seconds.     Comments: Diaphoretic   Neurological:     Mental Status: He is confused.     Cranial Nerves: Cranial nerves 2-12 are intact.     Motor: Tremor present.     Comments: Occasionally follows commands No obvious facial droop Spontaneously moves extremities A&O x 0 Tremor bil Cannot follow commands to assess for asterixis    ED Results / Procedures / Treatments   Labs (all labs ordered are listed, but only abnormal results are displayed) Labs Reviewed  CBC WITH DIFFERENTIAL/PLATELET - Abnormal; Notable for the following components:      Result Value   Hemoglobin 17.1 (*)    All other components within normal limits  COMPREHENSIVE METABOLIC PANEL WITH GFR - Abnormal; Notable for the following components:   Glucose, Bld 156 (*)    AST 89 (*)    ALT 149 (*)    All other components within normal limits  AMMONIA - Abnormal; Notable for the following components:   Ammonia 125 (*)    All other components within normal limits  ETHANOL - Abnormal; Notable for the following components:   Alcohol, Ethyl (B) 32 (*)    All other components within normal limits  ACETAMINOPHEN  LEVEL - Abnormal; Notable for the following components:   Acetaminophen  (Tylenol ), Serum <10 (*)    All other components within normal limits  SALICYLATE LEVEL - Abnormal; Notable for the following components:   Salicylate Lvl <7.0 (*)    All other components within normal limits  BLOOD GAS, VENOUS - Abnormal; Notable for the following components:   pH, Ven 7.45 (*)    pCO2, Ven 36 (*)    All other components within normal limits  CBC - Abnormal; Notable for the following components:   WBC 12.7 (*)    All other components within normal limits  BLOOD GAS, ARTERIAL - Abnormal; Notable for the following components:   pH, Arterial 7.3 (*)    pO2, Arterial 223 (*)    Acid-base deficit 2.9 (*)    All  other components within normal limits  CBG MONITORING, ED - Abnormal; Notable for the following components:   Glucose-Capillary 152 (*)    All other components within normal limits  I-STAT CG4 LACTIC ACID, ED - Abnormal; Notable for the following  components:   Lactic Acid, Venous 4.0 (*)    All other components within normal limits  I-STAT CG4 LACTIC ACID, ED - Abnormal; Notable for the following components:   Lactic Acid, Venous 4.1 (*)    All other components within normal limits  CBG MONITORING, ED - Abnormal; Notable for the following components:   Glucose-Capillary 155 (*)    All other components within normal limits  CULTURE, BLOOD (ROUTINE X 2)  CULTURE, BLOOD (ROUTINE X 2)  MRSA NEXT GEN BY PCR, NASAL  CULTURE, RESPIRATORY W GRAM STAIN  MAGNESIUM   RAPID URINE DRUG SCREEN, HOSP PERFORMED  TSH  CREATININE, SERUM  URINALYSIS, W/ REFLEX TO CULTURE (INFECTION SUSPECTED)  HIV ANTIBODY (ROUTINE TESTING W REFLEX)  CBC  BASIC METABOLIC PANEL WITH GFR  BLOOD GAS, ARTERIAL  MAGNESIUM   PHOSPHORUS  HEMOGLOBIN A1C  LACTIC ACID, PLASMA    EKG None  Radiology DG Chest Portable 1 View Result Date: 01/18/2024 CLINICAL DATA:  Check endotracheal tube placement EXAM: PORTABLE CHEST 1 VIEW COMPARISON:  01/17/2024 FINDINGS: Cardiac shadow is enlarged but accentuated by the frontal technique. Endotracheal tube is noted in satisfactory position. The lungs are hypoinflated but clear. IMPRESSION: Tubes and lines as described. Electronically Signed   By: Violeta Grey M.D.   On: 01/18/2024 02:55   DG Abd Portable 1V Result Date: 01/18/2024 CLINICAL DATA:  Check gastric catheter placement EXAM: PORTABLE ABDOMEN - 1 VIEW COMPARISON:  None Available. FINDINGS: Gastric catheter is noted in the distal esophagus. This should be advanced several cm deeper into the stomach. No obstructive changes are seen. IMPRESSION: Gastric catheter in the distal esophagus. This should be advanced several cm into the  abdomen. Electronically Signed   By: Violeta Grey M.D.   On: 01/18/2024 02:54   CT HEAD WO CONTRAST ( ) Result Date: 01/17/2024 CLINICAL DATA:  Altered mental status EXAM: CT HEAD WITHOUT CONTRAST TECHNIQUE: Contiguous axial images were obtained from the base of the skull through the vertex without intravenous contrast. RADIATION DOSE REDUCTION: This exam was performed according to the departmental dose-optimization program which includes automated exposure control, adjustment of the mA and/or kV according to patient size and/or use of iterative reconstruction technique. COMPARISON:  11/28/2022 FINDINGS: Brain: No acute intracranial abnormality. Specifically, no hemorrhage, hydrocephalus, mass lesion, acute infarction, or significant intracranial injury. Vascular: No hyperdense vessel or unexpected calcification. Skull: No acute calvarial abnormality. Sinuses/Orbits: No acute findings Other: None IMPRESSION: No acute intracranial abnormality. Electronically Signed   By: Janeece Mechanic M.D.   On: 01/17/2024 23:35   DG Chest Portable 1 View Result Date: 01/17/2024 CLINICAL DATA:  Altered mental status EXAM: PORTABLE CHEST 1 VIEW COMPARISON:  08/11/2023 FINDINGS: Cardiac shadow is enlarged. The lungs are hypoinflated but clear. No bony abnormality is noted. IMPRESSION: No acute abnormality noted. Electronically Signed   By: Violeta Grey M.D.   On: 01/17/2024 23:08    Procedures .Critical Care  Performed by: Dickson Founds, PA-C Authorized by: Dickson Founds, PA-C   Critical care provider statement:    Critical care time (minutes):  76   Critical care was necessary to treat or prevent imminent or life-threatening deterioration of the following conditions:  Toxidrome and hepatic failure   Critical care was time spent personally by me on the following activities:  Development of treatment plan with patient or surrogate, discussions with consultants, evaluation of patient's response to treatment,  examination of patient, ordering and review of laboratory studies, ordering and review of radiographic studies, ordering and  performing treatments and interventions, pulse oximetry, re-evaluation of patient's condition and review of old charts     Medications Ordered in ED Medications  LORazepam  (ATIVAN ) injection 0-4 mg (4 mg Intravenous Given 01/17/24 2355)    Or  LORazepam  (ATIVAN ) tablet 0-4 mg ( Oral See Alternative 01/17/24 2355)  LORazepam  (ATIVAN ) injection 0-4 mg (has no administration in time range)    Or  LORazepam  (ATIVAN ) tablet 0-4 mg (has no administration in time range)  thiamine  (VITAMIN B1) tablet 100 mg (has no administration in time range)    Or  thiamine  (VITAMIN B1) injection 100 mg (has no administration in time range)  metroNIDAZOLE (FLAGYL) IVPB 500 mg (has no administration in time range)  vancomycin (VANCOCIN) IVPB 1000 mg/200 mL premix (0 mg Intravenous Stopped 01/18/24 0327)    Followed by  vancomycin (VANCOCIN) IVPB 1000 mg/200 mL premix (1,000 mg Intravenous New Bag/Given 01/18/24 0333)  lactulose  (CHRONULAC ) enema 200 gm (has no administration in time range)  heparin  injection 5,000 Units (has no administration in time range)  famotidine (PEPCID) tablet 20 mg (has no administration in time range)  lactated ringers  infusion ( Intravenous New Bag/Given 01/18/24 0322)  ondansetron  (ZOFRAN ) injection 4 mg (has no administration in time range)  insulin aspart (novoLOG) injection 0-15 Units (3 Units Subcutaneous Given 01/18/24 0341)  propofol (DIPRIVAN) 1000 MG/100ML infusion (55 mcg/kg/min  120.2 kg Intravenous Rate/Dose Change 01/18/24 0404)  labetalol (NORMODYNE) injection 10 mg (has no administration in time range)  fentaNYL  in NS (5mcg/ml) infusion-PREMIX (25 mcg/hr Intravenous New Bag/Given 01/18/24 0356)  dexmedetomidine  (PRECEDEX ) 400 MCG/100ML (4 mcg/mL) infusion (1 mcg/kg/hr  120.2 kg Intravenous New Bag/Given 01/18/24 0344)  lactulose   (CHRONULAC ) 10 GM/15ML solution 10 g (has no administration in time range)  folic acid  injection 1 mg (has no administration in time range)  ceFEPIme (MAXIPIME) 2 g in sodium chloride  0.9 % 100 mL IVPB (has no administration in time range)  vancomycin (VANCOREADY) IVPB 1500 mg/300 mL (has no administration in time range)  LORazepam  (ATIVAN ) injection 2 mg (2 mg Intravenous Given 01/17/24 2318)  lactated ringers  bolus 2,000 mL (0 mLs Intravenous Stopped 01/18/24 0310)  ceFEPIme (MAXIPIME) 2 g in sodium chloride  0.9 % 100 mL IVPB (0 g Intravenous Stopped 01/18/24 0045)  lactated ringers  bolus 1,000 mL (0 mLs Intravenous Stopped 01/18/24 0310)  PHENObarbital  (LUMINAL) injection 200 mg (200 mg Intravenous Given 01/18/24 0100)  haloperidol  lactate (HALDOL ) injection 5 mg (5 mg Intravenous Given 01/18/24 0100)  LORazepam  (ATIVAN ) 2 MG/ML injection (2 mg  Given 01/18/24 0107)  midazolam  (VERSED ) injection 4 mg (4 mg Intravenous Given by Other 01/18/24 0204)  etomidate  (AMIDATE ) injection 20 mg (20 mg Intravenous Given by Other 01/18/24 0205)  rocuronium (ZEMURON) injection 100 mg (100 mg Intravenous Given by Other 01/18/24 0206)    ED Course/ Medical Decision Making/ A&P Clinical Course as of 01/18/24 0409  Tue Jan 17, 2024  2347 Lactic elevated, given tachycardia, AMS Code sepsis called. No obvious source of infection currently [BH]  Wed Jan 18, 2024  0042 Dr. Mason Sole with pulmonary critical care will see for admission.  Recommends phenobarbital  200 mg run over 10 minutes [BH]  0049 Called to bedside by nursing patient agitated, removing equipment, swatting at staff.  He remains altered.  Will give some Haldol  and soft restraints for patient's and staff safety. [BH]  0112 Patient still increasingly agitated despite additional Ativan , Haldol .  Will start Precedex . Phenobarbital  still awaiting dosing from pharmacy. [BH]  0119 Made  aware by nursing Phenobarbital  was just given, will hold on Precedex . More  compliant at this time, protecting airway [BH]  0139 Patient reassessed.  Still fighting staff, agitated, altered. PCCM reconsulted by Dr. Wallis Gun, will start Precedex  [BH]    Clinical Course User Index [BH] Schneider Warchol A, PA-C   43 year old history of EtOH abuse, hypothyroidism, hypertension here for evaluation of altered mental status.  Patient altered, unable provide history according mother he had an alcohol binge or over the weekend, has been sober for around 24 to 36 hours.  He apparently called her at 4:00 stating that he "needed help."  She went to see him this evening when he was noted to be diaphoretic, altered, not following commands.  Unknown if seizure-like activity.  He has a mild tremor to his extremities, diaphoretic.  Bilateral nystagmus.  No obvious trauma on exam.  He is tachycardic, hypertensive.  Cannot follow commands cannot assess for asterixis due to altered mental status.  He does spontaneously move bilateral sides.  Will plan broad workup, head imaging, will give 2 mg IV Ativan .  Add on lactic as well as blood cultures.  Give some IV fluids.  Mother states similar presentation in December requiring ICU admission for Precedex  due to EtOH withdrawal.  Labs imaging personally viewed and interpreted:  CBC leukocytosis 12.7 Lactic acid 4--4.1 despite IVF VBG without acidosis, no evidence of CO2 retention TSH 2.1 Ammonia 125 ethanol 32 UDS negative Salicylate, acetaminophen  within normal limits Metabolic panel mild LFT elevation--reviewed imaging right upper quadrant from prior admission showed hepatic steatosis CT head without acute abnormality Chest x-ray without pneumonia, pneumothorax, edema  Patient reassessed multiple times.   Patient with increasing agitation and AMS despite multiple IV sedatives including Ativan  x 3, Haldol , phenobarbital  and Precedex   Patient subsequently intubated by PCCM for airway protection. Admitted to critical care for Delirium  tremens and elevated ammonia.  The patient appears reasonably stabilized for admission considering the current resources, flow, and capabilities available in the ED at this time, and I doubt any other [facility] requiring further screening and/or treatment in the ED prior to admission.   DDX: elevate ammonia, intoxication, sepsis, trauma (bleed), SAH, infection, electrolyte  abnormality, withdrawl                               Medical Decision Making Amount and/or Complexity of Data Reviewed Independent Historian: parent External Data Reviewed: labs, radiology, ECG and notes.    Details: Recent admission Dec with ICU admission, outpatient hospital records Labs: ordered. Decision-making details documented in ED Course. Radiology: ordered and independent interpretation performed. Decision-making details documented in ED Course. ECG/medicine tests: ordered and independent interpretation performed. Decision-making details documented in ED Course.  Risk OTC drugs. Prescription drug management. Parenteral controlled substances. Decision regarding hospitalization. Diagnosis or treatment significantly limited by social determinants of health. Risk Details: Critical care with intubation for DTs           Final Clinical Impression(s) / ED Diagnoses Final diagnoses:  Delirium tremens (HCC)  Alcohol withdrawal syndrome, with delirium (HCC)  SIRS (systemic inflammatory response syndrome) (HCC)  Hyperammonemia (HCC)  Elevated LFTs    Rx / DC Orders ED Discharge Orders     None         Hassen Bruun A, PA-C 01/18/24 0411    Eldon Greenland, MD 01/18/24 0502

## 2024-01-17 NOTE — ED Triage Notes (Signed)
 Family report patient was found passed out in the couch. Family report patient eventually woke up but very confuse and unable to communicate. Patient diaphoretic and confuse at triage. Hx of Alcohol abuse.

## 2024-01-17 NOTE — ED Notes (Signed)
 I- stat lactic 4.02 EDP [place] notified

## 2024-01-17 NOTE — Sepsis Progress Note (Signed)
 Elink monitoring for the code sepsis protocol.

## 2024-01-18 ENCOUNTER — Emergency Department (HOSPITAL_COMMUNITY)

## 2024-01-18 ENCOUNTER — Inpatient Hospital Stay (HOSPITAL_COMMUNITY)

## 2024-01-18 DIAGNOSIS — K567 Ileus, unspecified: Secondary | ICD-10-CM | POA: Diagnosis not present

## 2024-01-18 DIAGNOSIS — T40415A Adverse effect of fentanyl or fentanyl analogs, initial encounter: Secondary | ICD-10-CM | POA: Diagnosis not present

## 2024-01-18 DIAGNOSIS — R700 Elevated erythrocyte sedimentation rate: Secondary | ICD-10-CM | POA: Diagnosis present

## 2024-01-18 DIAGNOSIS — Z79890 Hormone replacement therapy (postmenopausal): Secondary | ICD-10-CM | POA: Diagnosis not present

## 2024-01-18 DIAGNOSIS — E66811 Obesity, class 1: Secondary | ICD-10-CM | POA: Diagnosis present

## 2024-01-18 DIAGNOSIS — E8729 Other acidosis: Secondary | ICD-10-CM | POA: Diagnosis not present

## 2024-01-18 DIAGNOSIS — E872 Acidosis, unspecified: Secondary | ICD-10-CM | POA: Diagnosis present

## 2024-01-18 DIAGNOSIS — I82433 Acute embolism and thrombosis of popliteal vein, bilateral: Secondary | ICD-10-CM | POA: Diagnosis not present

## 2024-01-18 DIAGNOSIS — M1A00X0 Idiopathic chronic gout, unspecified site, without tophus (tophi): Secondary | ICD-10-CM | POA: Diagnosis present

## 2024-01-18 DIAGNOSIS — Z79899 Other long term (current) drug therapy: Secondary | ICD-10-CM | POA: Diagnosis not present

## 2024-01-18 DIAGNOSIS — E782 Mixed hyperlipidemia: Secondary | ICD-10-CM | POA: Diagnosis present

## 2024-01-18 DIAGNOSIS — I2609 Other pulmonary embolism with acute cor pulmonale: Secondary | ICD-10-CM | POA: Diagnosis not present

## 2024-01-18 DIAGNOSIS — G9341 Metabolic encephalopathy: Secondary | ICD-10-CM | POA: Diagnosis present

## 2024-01-18 DIAGNOSIS — Z781 Physical restraint status: Secondary | ICD-10-CM | POA: Diagnosis not present

## 2024-01-18 DIAGNOSIS — T483X5A Adverse effect of antitussives, initial encounter: Secondary | ICD-10-CM | POA: Diagnosis present

## 2024-01-18 DIAGNOSIS — E039 Hypothyroidism, unspecified: Secondary | ICD-10-CM | POA: Diagnosis present

## 2024-01-18 DIAGNOSIS — E876 Hypokalemia: Secondary | ICD-10-CM | POA: Diagnosis not present

## 2024-01-18 DIAGNOSIS — I2699 Other pulmonary embolism without acute cor pulmonale: Secondary | ICD-10-CM | POA: Diagnosis not present

## 2024-01-18 DIAGNOSIS — T421X5A Adverse effect of iminostilbenes, initial encounter: Secondary | ICD-10-CM | POA: Diagnosis present

## 2024-01-18 DIAGNOSIS — T43225A Adverse effect of selective serotonin reuptake inhibitors, initial encounter: Secondary | ICD-10-CM | POA: Diagnosis present

## 2024-01-18 DIAGNOSIS — G9081 Serotonin syndrome: Secondary | ICD-10-CM | POA: Diagnosis present

## 2024-01-18 DIAGNOSIS — J9601 Acute respiratory failure with hypoxia: Secondary | ICD-10-CM

## 2024-01-18 DIAGNOSIS — R6510 Systemic inflammatory response syndrome (SIRS) of non-infectious origin without acute organ dysfunction: Secondary | ICD-10-CM | POA: Diagnosis not present

## 2024-01-18 DIAGNOSIS — I10 Essential (primary) hypertension: Secondary | ICD-10-CM | POA: Diagnosis present

## 2024-01-18 DIAGNOSIS — R509 Fever, unspecified: Secondary | ICD-10-CM | POA: Diagnosis not present

## 2024-01-18 DIAGNOSIS — R609 Edema, unspecified: Secondary | ICD-10-CM | POA: Diagnosis not present

## 2024-01-18 DIAGNOSIS — T43215A Adverse effect of selective serotonin and norepinephrine reuptake inhibitors, initial encounter: Secondary | ICD-10-CM | POA: Diagnosis present

## 2024-01-18 DIAGNOSIS — F10931 Alcohol use, unspecified with withdrawal delirium: Secondary | ICD-10-CM | POA: Diagnosis present

## 2024-01-18 DIAGNOSIS — Z6831 Body mass index (BMI) 31.0-31.9, adult: Secondary | ICD-10-CM | POA: Diagnosis not present

## 2024-01-18 DIAGNOSIS — J690 Pneumonitis due to inhalation of food and vomit: Secondary | ICD-10-CM | POA: Diagnosis present

## 2024-01-18 DIAGNOSIS — F10231 Alcohol dependence with withdrawal delirium: Secondary | ICD-10-CM | POA: Diagnosis present

## 2024-01-18 DIAGNOSIS — I2602 Saddle embolus of pulmonary artery with acute cor pulmonale: Secondary | ICD-10-CM | POA: Diagnosis not present

## 2024-01-18 DIAGNOSIS — Z8249 Family history of ischemic heart disease and other diseases of the circulatory system: Secondary | ICD-10-CM | POA: Diagnosis not present

## 2024-01-18 LAB — BASIC METABOLIC PANEL WITH GFR
Anion gap: 12 (ref 5–15)
BUN: 13 mg/dL (ref 6–20)
CO2: 21 mmol/L — ABNORMAL LOW (ref 22–32)
Calcium: 8.7 mg/dL — ABNORMAL LOW (ref 8.9–10.3)
Chloride: 108 mmol/L (ref 98–111)
Creatinine, Ser: 0.82 mg/dL (ref 0.61–1.24)
GFR, Estimated: >60 mL/min (ref >60)
Glucose, Bld: 123 mg/dL — ABNORMAL HIGH (ref 70–99)
Potassium: 4.1 mmol/L (ref 3.5–5.1)
Sodium: 141 mmol/L (ref 135–145)

## 2024-01-18 LAB — BLOOD GAS, ARTERIAL
Acid-Base Excess: 0.7 mmol/L (ref 0.0–2.0)
Acid-base deficit: 2.9 mmol/L — ABNORMAL HIGH (ref 0.0–2.0)
Acid-base deficit: 0.1 mmol/L (ref 0.0–2.0)
Bicarbonate: 23.7 mmol/L (ref 20.0–28.0)
Bicarbonate: 23.9 mmol/L (ref 20.0–28.0)
Bicarbonate: 24.3 mmol/L (ref 20.0–28.0)
Drawn by: 56037
Drawn by: 56037
Drawn by: 25770
FIO2: 100 %
FIO2: 40 %
FIO2: 40 %
MECHVT: 580 mL
MECHVT: 580 mL
O2 Saturation: 100 %
O2 Saturation: 99.5 %
O2 Saturation: 99.5 %
PEEP: 5 cmH2O
PEEP: 5 cmH2O
PEEP: 5 cmH2O
Patient temperature: 37.4
Patient temperature: 37.7
Patient temperature: 37.7
Pressure support: 10 cmH2O
RATE: 18 {breaths}/min
RATE: 20 {breaths}/min
pCO2 arterial: 48 mmHg (ref 32–48)
pCO2 arterial: 37 mmHg (ref 32–48)
pCO2 arterial: 36 mmHg (ref 32–48)
pH, Arterial: 7.3 — ABNORMAL LOW (ref 7.35–7.45)
pH, Arterial: 7.42 (ref 7.35–7.45)
pH, Arterial: 7.44 (ref 7.35–7.45)
pO2, Arterial: 223 mmHg — ABNORMAL HIGH (ref 83–108)
pO2, Arterial: 143 mmHg — ABNORMAL HIGH (ref 83–108)
pO2, Arterial: 141 mmHg — ABNORMAL HIGH (ref 83–108)

## 2024-01-18 LAB — GLUCOSE, CAPILLARY
Glucose-Capillary: 132 mg/dL — ABNORMAL HIGH (ref 70–99)
Glucose-Capillary: 115 mg/dL — ABNORMAL HIGH (ref 70–99)
Glucose-Capillary: 111 mg/dL — ABNORMAL HIGH (ref 70–99)
Glucose-Capillary: 107 mg/dL — ABNORMAL HIGH (ref 70–99)
Glucose-Capillary: 122 mg/dL — ABNORMAL HIGH (ref 70–99)

## 2024-01-18 LAB — CBC
HCT: 44.3 % (ref 39.0–52.0)
HCT: 41.5 % (ref 39.0–52.0)
Hemoglobin: 15 g/dL (ref 13.0–17.0)
Hemoglobin: 14.3 g/dL (ref 13.0–17.0)
MCH: 29.9 pg (ref 26.0–34.0)
MCH: 30.2 pg (ref 26.0–34.0)
MCHC: 33.9 g/dL (ref 30.0–36.0)
MCHC: 34.5 g/dL (ref 30.0–36.0)
MCV: 88.4 fL (ref 80.0–100.0)
MCV: 87.7 fL (ref 80.0–100.0)
Platelets: 239 10*3/uL (ref 150–400)
Platelets: 217 10*3/uL (ref 150–400)
RBC: 5.01 MIL/uL (ref 4.22–5.81)
RBC: 4.73 MIL/uL (ref 4.22–5.81)
RDW: 13.5 % (ref 11.5–15.5)
RDW: 13.5 % (ref 11.5–15.5)
WBC: 12.7 10*3/uL — ABNORMAL HIGH (ref 4.0–10.5)
WBC: 12.6 10*3/uL — ABNORMAL HIGH (ref 4.0–10.5)
nRBC: 0 % (ref 0.0–0.2)
nRBC: 0 % (ref 0.0–0.2)

## 2024-01-18 LAB — URINALYSIS, W/ REFLEX TO CULTURE (INFECTION SUSPECTED)
Bacteria, UA: NONE SEEN
Bilirubin Urine: NEGATIVE
Glucose, UA: NEGATIVE mg/dL
Hgb urine dipstick: NEGATIVE
Ketones, ur: NEGATIVE mg/dL
Nitrite: NEGATIVE
Protein, ur: 100 mg/dL — AB
Specific Gravity, Urine: 1.036 — ABNORMAL HIGH (ref 1.005–1.030)
pH: 5 (ref 5.0–8.0)

## 2024-01-18 LAB — I-STAT CG4 LACTIC ACID, ED: Lactic Acid, Venous: 4.1 mmol/L (ref 0.5–1.9)

## 2024-01-18 LAB — BLOOD GAS, VENOUS
Acid-Base Excess: 1.3 mmol/L (ref 0.0–2.0)
Bicarbonate: 25 mmol/L (ref 20.0–28.0)
Drawn by: 72677
O2 Saturation: 69.9 %
Patient temperature: 37
pCO2, Ven: 36 mmHg — ABNORMAL LOW (ref 44–60)
pH, Ven: 7.45 — ABNORMAL HIGH (ref 7.25–7.43)
pO2, Ven: 38 mmHg (ref 32–45)

## 2024-01-18 LAB — PROTIME-INR
INR: 1.2 (ref 0.8–1.2)
Prothrombin Time: 14.9 s (ref 11.4–15.2)

## 2024-01-18 LAB — MRSA NEXT GEN BY PCR, NASAL: MRSA by PCR Next Gen: NOT DETECTED

## 2024-01-18 LAB — CREATININE, SERUM
Creatinine, Ser: 0.98 mg/dL (ref 0.61–1.24)
GFR, Estimated: >60 mL/min (ref >60)

## 2024-01-18 LAB — LACTIC ACID, PLASMA
Lactic Acid, Venous: 3.1 mmol/L (ref 0.5–1.9)
Lactic Acid, Venous: 2.5 mmol/L (ref 0.5–1.9)

## 2024-01-18 LAB — MAGNESIUM
Magnesium: 1.9 mg/dL (ref 1.7–2.4)
Magnesium: 1.7 mg/dL (ref 1.7–2.4)

## 2024-01-18 LAB — PHOSPHORUS: Phosphorus: 4.1 mg/dL (ref 2.5–4.6)

## 2024-01-18 LAB — HIV ANTIBODY (ROUTINE TESTING W REFLEX): HIV Screen 4th Generation wRfx: NONREACTIVE

## 2024-01-18 LAB — CBG MONITORING, ED: Glucose-Capillary: 155 mg/dL — ABNORMAL HIGH (ref 70–99)

## 2024-01-18 LAB — TSH: TSH: 2.162 u[IU]/mL (ref 0.350–4.500)

## 2024-01-18 LAB — HEMOGLOBIN A1C
Hgb A1c MFr Bld: 5.4 % (ref 4.8–5.6)
Mean Plasma Glucose: 108.28 mg/dL

## 2024-01-18 LAB — AMMONIA: Ammonia: 125 umol/L — ABNORMAL HIGH (ref 9–35)

## 2024-01-18 MED ORDER — LABETALOL HCL 5 MG/ML IV SOLN
10.0000 mg | INTRAVENOUS | Status: DC | PRN
  Administered 2024-01-20 – 2024-01-28 (×8): 10 mg via INTRAVENOUS
  Filled 2024-01-18 (×10): qty 4

## 2024-01-18 MED ORDER — PROSOURCE TF20 ENFIT COMPATIBL EN LIQD
60.0000 mL | Freq: Every day | ENTERAL | Status: DC
  Administered 2024-01-18 – 2024-01-27 (×9): 60 mL
  Filled 2024-01-18 (×9): qty 60

## 2024-01-18 MED ORDER — PHENOBARBITAL 32.4 MG PO TABS: 32.4000 mg | ORAL_TABLET | Freq: Three times a day (TID) | ORAL | Status: DC

## 2024-01-18 MED ORDER — MIDAZOLAM HCL 2 MG/2ML IJ SOLN
INTRAMUSCULAR | Status: AC
  Administered 2024-01-18: 4 mg via INTRAVENOUS
  Filled 2024-01-18: qty 4

## 2024-01-18 MED ORDER — ORAL CARE MOUTH RINSE
15.0000 mL | OROMUCOSAL | Status: DC
  Administered 2024-01-18 – 2024-01-25 (×86): 15 mL via OROMUCOSAL

## 2024-01-18 MED ORDER — MIDAZOLAM HCL 2 MG/2ML IJ SOLN: 4.0000 mg | Freq: Once | INTRAMUSCULAR | Status: AC

## 2024-01-18 MED ORDER — DEXMEDETOMIDINE HCL IN NACL 400 MCG/100ML IV SOLN
0.0000 ug/kg/h | INTRAVENOUS | Status: DC
  Administered 2024-01-18: 0.4 ug/kg/h via INTRAVENOUS
  Filled 2024-01-18: qty 100

## 2024-01-18 MED ORDER — ACETAMINOPHEN 160 MG/5ML PO SOLN
325.0000 mg | Freq: Four times a day (QID) | ORAL | Status: DC | PRN
  Administered 2024-01-18 – 2024-01-24 (×14): 325 mg
  Filled 2024-01-18 (×14): qty 20.3

## 2024-01-18 MED ORDER — LACTATED RINGERS IV BOLUS
1000.0000 mL | Freq: Once | INTRAVENOUS | Status: AC
  Administered 2024-01-18: 1000 mL via INTRAVENOUS

## 2024-01-18 MED ORDER — ETOMIDATE 2 MG/ML IV SOLN
INTRAVENOUS | Status: AC
  Administered 2024-01-18: 20 mg via INTRAVENOUS
  Filled 2024-01-18: qty 20

## 2024-01-18 MED ORDER — AMPICILLIN-SULBACTAM SODIUM 3 (2-1) G IJ SOLR
3.0000 g | Freq: Four times a day (QID) | INTRAMUSCULAR | Status: AC
  Administered 2024-01-18 – 2024-01-26 (×35): 3 g via INTRAVENOUS
  Filled 2024-01-18 (×36): qty 8

## 2024-01-18 MED ORDER — LACTULOSE 10 GM/15ML PO SOLN
10.0000 g | Freq: Two times a day (BID) | ORAL | Status: DC
  Administered 2024-01-18 (×2): 10 g
  Filled 2024-01-18 (×4): qty 15

## 2024-01-18 MED ORDER — ETOMIDATE 2 MG/ML IV SOLN: 20.0000 mg | Freq: Once | INTRAVENOUS | Status: AC

## 2024-01-18 MED ORDER — LACTATED RINGERS IV SOLN: INTRAVENOUS | Status: DC

## 2024-01-18 MED ORDER — PHENOBARBITAL 32.4 MG PO TABS: 64.8000 mg | ORAL_TABLET | Freq: Three times a day (TID) | ORAL | Status: DC

## 2024-01-18 MED ORDER — ROCURONIUM BROMIDE 10 MG/ML (PF) SYRINGE
PREFILLED_SYRINGE | INTRAVENOUS | Status: AC
  Administered 2024-01-18: 100 mg via INTRAVENOUS
  Filled 2024-01-18: qty 10

## 2024-01-18 MED ORDER — ROCURONIUM BROMIDE 10 MG/ML (PF) SYRINGE: 100.0000 mg | PREFILLED_SYRINGE | Freq: Once | INTRAVENOUS | Status: AC

## 2024-01-18 MED ORDER — LACTATED RINGERS IV BOLUS
500.0000 mL | Freq: Once | INTRAVENOUS | Status: AC
  Administered 2024-01-18: 500 mL via INTRAVENOUS

## 2024-01-18 MED ORDER — ORAL CARE MOUTH RINSE: 15.0000 mL | OROMUCOSAL | Status: DC | PRN

## 2024-01-18 MED ORDER — FAMOTIDINE 20 MG PO TABS
20.0000 mg | ORAL_TABLET | Freq: Two times a day (BID) | ORAL | Status: DC
  Administered 2024-01-18 – 2024-01-20 (×3): 20 mg
  Filled 2024-01-18 (×5): qty 1

## 2024-01-18 MED ORDER — LACTULOSE ENEMA
300.0000 mL | Freq: Once | ORAL | Status: AC
  Administered 2024-01-18: 300 mL via RECTAL
  Filled 2024-01-18: qty 300

## 2024-01-18 MED ORDER — VITAL 1.5 CAL PO LIQD
1000.0000 mL | ORAL | Status: DC
  Administered 2024-01-18: 1000 mL
  Filled 2024-01-18 (×4): qty 1000

## 2024-01-18 MED ORDER — CHLORHEXIDINE GLUCONATE CLOTH 2 % EX PADS
6.0000 | MEDICATED_PAD | Freq: Every day | CUTANEOUS | Status: DC
  Administered 2024-01-19 – 2024-01-28 (×10): 6 via TOPICAL

## 2024-01-18 MED ORDER — SODIUM CHLORIDE 0.9 % IV SOLN
2.0000 g | Freq: Three times a day (TID) | INTRAVENOUS | Status: DC
  Filled 2024-01-18 (×2): qty 12.5

## 2024-01-18 MED ORDER — HEPARIN SODIUM (PORCINE) 5000 UNIT/ML IJ SOLN
5000.0000 [IU] | Freq: Three times a day (TID) | INTRAMUSCULAR | Status: DC
  Administered 2024-01-18 – 2024-01-30 (×38): 5000 [IU] via SUBCUTANEOUS
  Filled 2024-01-18 (×38): qty 1

## 2024-01-18 MED ORDER — PHENOBARBITAL SODIUM 130 MG/ML IJ SOLN
200.0000 mg | Freq: Once | INTRAMUSCULAR | Status: AC
  Administered 2024-01-18: 200 mg via INTRAVENOUS
  Filled 2024-01-18: qty 2

## 2024-01-18 MED ORDER — MIDAZOLAM HCL 2 MG/2ML IJ SOLN
2.0000 mg | INTRAMUSCULAR | Status: DC | PRN
  Administered 2024-01-19 (×3): 2 mg via INTRAVENOUS
  Filled 2024-01-18 (×3): qty 2

## 2024-01-18 MED ORDER — FOLIC ACID 5 MG/ML IJ SOLN
1.0000 mg | Freq: Every day | INTRAMUSCULAR | Status: DC
  Administered 2024-01-18 – 2024-01-28 (×10): 1 mg via INTRAVENOUS
  Filled 2024-01-18 (×12): qty 0.2

## 2024-01-18 MED ORDER — INSULIN ASPART 100 UNIT/ML IJ SOLN
0.0000 [IU] | INTRAMUSCULAR | Status: DC
  Administered 2024-01-18: 2 [IU] via SUBCUTANEOUS
  Administered 2024-01-18: 3 [IU] via SUBCUTANEOUS
  Administered 2024-01-19 – 2024-01-21 (×4): 2 [IU] via SUBCUTANEOUS
  Administered 2024-01-23: 1 [IU] via SUBCUTANEOUS
  Administered 2024-01-23 – 2024-01-24 (×4): 2 [IU] via SUBCUTANEOUS
  Administered 2024-01-27: 3 [IU] via SUBCUTANEOUS
  Administered 2024-01-29: 2 [IU] via SUBCUTANEOUS
  Filled 2024-01-18: qty 0.15

## 2024-01-18 MED ORDER — SUCCINYLCHOLINE CHLORIDE 200 MG/10ML IV SOSY
PREFILLED_SYRINGE | INTRAVENOUS | Status: AC
  Filled 2024-01-18: qty 10

## 2024-01-18 MED ORDER — DEXMEDETOMIDINE HCL IN NACL 400 MCG/100ML IV SOLN
0.0000 ug/kg/h | INTRAVENOUS | Status: AC
  Administered 2024-01-18 (×2): 1.1 ug/kg/h via INTRAVENOUS
  Administered 2024-01-18 (×2): 1 ug/kg/h via INTRAVENOUS
  Administered 2024-01-18: 1.2 ug/kg/h via INTRAVENOUS
  Administered 2024-01-18: 1 ug/kg/h via INTRAVENOUS
  Administered 2024-01-19 – 2024-01-21 (×24): 1.2 ug/kg/h via INTRAVENOUS
  Filled 2024-01-18 (×31): qty 100

## 2024-01-18 MED ORDER — FENTANYL 2500MCG IN NS 250ML (10MCG/ML) PREMIX INFUSION
0.0000 ug/h | INTRAVENOUS | Status: DC
  Administered 2024-01-18: 25 ug/h via INTRAVENOUS
  Administered 2024-01-19: 400 ug/h via INTRAVENOUS
  Administered 2024-01-19 (×2): 250 ug/h via INTRAVENOUS
  Administered 2024-01-20: 350 ug/h via INTRAVENOUS
  Administered 2024-01-20: 250 ug/h via INTRAVENOUS
  Administered 2024-01-20: 200 ug/h via INTRAVENOUS
  Administered 2024-01-21: 250 ug/h via INTRAVENOUS
  Administered 2024-01-21: 400 ug/h via INTRAVENOUS
  Administered 2024-01-22: 150 ug/h via INTRAVENOUS
  Administered 2024-01-22: 225 ug/h via INTRAVENOUS
  Administered 2024-01-22: 175 ug/h via INTRAVENOUS
  Filled 2024-01-18 (×13): qty 250

## 2024-01-18 MED ORDER — VANCOMYCIN HCL 1500 MG/300ML IV SOLN: 1500.0000 mg | Freq: Two times a day (BID) | INTRAVENOUS | Status: DC

## 2024-01-18 MED ORDER — THIAMINE HCL 100 MG/ML IJ SOLN
100.0000 mg | Freq: Every day | INTRAMUSCULAR | Status: DC
Start: 2024-01-18 — End: 2024-01-18

## 2024-01-18 MED ORDER — PROPOFOL 1000 MG/100ML IV EMUL: 0.0000 ug/kg/min | INTRAVENOUS | Status: DC

## 2024-01-18 MED ORDER — PHENOBARBITAL 32.4 MG PO TABS
97.2000 mg | ORAL_TABLET | Freq: Three times a day (TID) | ORAL | Status: DC
  Administered 2024-01-18 – 2024-01-19 (×3): 97.2 mg
  Filled 2024-01-18 (×3): qty 3

## 2024-01-18 MED ORDER — HALOPERIDOL LACTATE 5 MG/ML IJ SOLN
5.0000 mg | Freq: Once | INTRAMUSCULAR | Status: AC
  Administered 2024-01-18: 5 mg via INTRAVENOUS
  Filled 2024-01-18: qty 1

## 2024-01-18 MED ORDER — ONDANSETRON HCL 4 MG/2ML IJ SOLN: 4.0000 mg | Freq: Four times a day (QID) | INTRAMUSCULAR | Status: DC | PRN

## 2024-01-18 MED ORDER — LORAZEPAM 2 MG/ML IJ SOLN
INTRAMUSCULAR | Status: AC
  Administered 2024-01-18: 2 mg
  Filled 2024-01-18: qty 1

## 2024-01-18 MED ORDER — PROPOFOL 1000 MG/100ML IV EMUL
0.0000 ug/kg/min | INTRAVENOUS | Status: DC
  Administered 2024-01-18: 50 ug/kg/min via INTRAVENOUS
  Administered 2024-01-18 (×2): 60 ug/kg/min via INTRAVENOUS
  Administered 2024-01-18: 55 ug/kg/min via INTRAVENOUS
  Administered 2024-01-18 (×3): 60 ug/kg/min via INTRAVENOUS
  Administered 2024-01-18: 80 ug/kg/min via INTRAVENOUS
  Administered 2024-01-18: 60 ug/kg/min via INTRAVENOUS
  Administered 2024-01-19: 80 ug/kg/min via INTRAVENOUS
  Administered 2024-01-19: 30 ug/kg/min via INTRAVENOUS
  Administered 2024-01-19 (×5): 80 ug/kg/min via INTRAVENOUS
  Filled 2024-01-18 (×16): qty 100

## 2024-01-18 MED ORDER — FENTANYL BOLUS VIA INFUSION
30.0000 ug | INTRAVENOUS | Status: DC | PRN
  Administered 2024-01-18 – 2024-01-19 (×6): 30 ug via INTRAVENOUS

## 2024-01-18 MED ORDER — PROPOFOL 1000 MG/100ML IV EMUL
INTRAVENOUS | Status: AC
Start: 2024-01-18 — End: 2024-01-18
  Administered 2024-01-18: 30 ug/kg/min via INTRAVENOUS
  Filled 2024-01-18: qty 100

## 2024-01-18 NOTE — Progress Notes (Addendum)
 Interim CCM Progress Note   General: acute on chronic adult male, lying in icu bed on vent  HEENT: Normocephalic, PERRLA intact, ETT, OG, Pink MM CV: s1,s2, RRR, no MRG, No JVD  pulm: clear, diminished, no distress on vent Abs: obese, bs active, soft  Extremities: non pitting edema, no deformity, moves to painful stimuli  Skin: no rash  Neuro: Rass -3 to -4,  responds to painful stimuli, cough gag reflex present  GU: foley intact, cloudy    Assessment and Plan   Acute Metabolic Encephalopathy  Acute ETOH withdrawal, Delirium Tremens  He was started on Precedex  drip prior to my arrival in ED as he was agitated despite receiving Ativan  and Phenobarbital . High Ammonia may also contribute to his AMS Intubated for airway protection  P: Continue precedex , propofol, and fentanyl   Start phenobarb taper Continue thiamine , folic acid , multivitamin  Continue maintenance fluids  Place cortrak   Acute Hypoxic Respiratory Failure secondary to above  Last ABG results  P:  Continue ventilator support and lung protective strategies  Continue LTVV, changed from PRVC to Pressure Support- due to vent dy Wean PEEP and Fio2 requirements to sat goal of >92%  HOB > 30 degrees Plat < 30, Aim for Driving pressures < 15  Intermittent Chest X-ray and ABGS Follow cultures-blood and tracheal aspirate VAP and PAD protocols in place- precedex , propofol, and fentanyl   Continue RASS goal of -3 to -4, will try WUA on 5/22   Lactic acidosis Suspecting possible sepsis from aspiration pneumonia/UTI vs withdrawal  Currently lactate clearing with fluid resuscitation  Received vanc, flagyl, cefepime in ED  MRSA negative  Urine cloudy  P:  Give 500 cc LR bolus  Continue to trend lactate, if increases- would place patient on cerebell to evaluate for subclinical seizures  DC vanc, discussed with pharmacy- recommend holding off zosyn since no risk factors for pseudomonas, dc's cefepime switch to unasyn  Continue  to follow tracheal aspirate   Send UA   HTN  HLD P:  Continue monitoring BP, BP maintaining more normotensive with sedation  - will need to restart antihypertensives: lisinopril -hydrochlorothiazide, propranolol tomorrow 5/22 Restart Lipitor 20mg    Rey Catholic AGACNP-BC   [facility] 01/18/2024, 10:35 AM  Please see [email or web address] for pager details.  From 7A-7P if no response, please call [phone]. After hours, please call ELink [phone].

## 2024-01-18 NOTE — Procedures (Signed)
 CORTRAK TUBE INSERTION   A 10 F Cortrak tube has been placed. The tube was secured at the right nare at 92 cm by a member of the Cortrak tube team. The tube tip is post pyloric.  X-ray is required, abdominal x-ray has been ordered. Please confirm tube placement before using the Cortrak tube.    If the tube becomes dislodged please keep the tube and contact the Cortrak team at [phone] for replacement.  If after hours and replacement cannot be delayed, place a NG tube and confirm placement with an abdominal x-ray.  Morell Mears, RN 01/18/2024 11:10 AM

## 2024-01-18 NOTE — ED Notes (Signed)
 I stat lactic acid 4.13 EDP [place] notified

## 2024-01-18 NOTE — Progress Notes (Signed)
 Pharmacy Antibiotic Note  Juan Clarke is a 43 y.o. male admitted on 01/17/2024 with sepsis and aspiration pneumonia.  Patient intubated in the ED.  Pharmacy has been consulted for Vancomycin and Cefepime dosing.  Vancomycin 2g IV, Cefepime 2g IV and Metronidazole 500mg  IV x 1 dose in the ED.  Plan: Vancomycin 1500 mg IV Q 12 hrs. Goal AUC 400-550.  Expected AUC: 525.7  SCr used: 0.92 Cefepime 2g IV q8h Follow renal function F/u culture results & sensitivities  Height: 5\' 10"  (177.8 cm) Weight: 120.2 kg (264 lb 15.9 oz) IBW/kg (Calculated) : 73  Temp (24hrs), Avg:99.1 F (37.3 C), Min:97.9 F (36.6 C), Max:99.5 F (37.5 C)  Recent Labs  Lab 01/17/24 2245 01/17/24 2339 01/18/24 0045  WBC 7.5  --   --   CREATININE 0.92  --   --   LATICACIDVEN  --  4.0* 4.1*    Estimated Creatinine Clearance: 137.4 mL/min (by C-G formula based on SCr of 0.92 mg/dL).    Allergies  Allergen Reactions   Lexapro [Escitalopram Oxalate] Other (See Comments)    Lack of therapeutic effect    Zoloft [Sertraline Hcl] Other (See Comments)    Lack of therapeutic effect     Antimicrobials this admission: 5/21 Metronidazole x 1 5/21 Cefepime >>   5/21 Vancomycin  >>    Dose adjustments this admission:    Microbiology results: 5/20 BCx:   5/21 Sputum:    5/21 MRSA PCR:    Thank you for allowing pharmacy to be a part of this patient's care.  Rulon Councilman, PharmD 01/18/2024 3:24 AM

## 2024-01-18 NOTE — ED Notes (Signed)
 Pts bed alarm went off, and this RN and Juanique, RN at bedside immediately. Pt was out of bed, attempting to rip off the leads, rip out his IVs, and take all of his clothes off. Pt was agitated and combative. It took 10 RNs, paramedics, and ED techs to get the pt back in bed. Pt continued to be combative and trying to hit and kick staff. Soft restraints were trialed, and pt was able to get out of the restraints and continue to try to attack staff and climb out of the bed. Pt was placed in violent restraints and see MAR for medication interventions. Pt continued to be combative and agitated.

## 2024-01-18 NOTE — Progress Notes (Addendum)
 eLink Physician-Brief Progress Note Patient Name: Juan Clarke DOB: [birth year] MRN: [id number]   Date of Service  01/18/2024  HPI/Events of Note  43 year old male with alcohol use disorder who presents with sepsis and shock secondary to aspiration pneumonia requiring intubation and mechanical ventilation. Undergoing severe alcohol withdrawal.   Patient is agitated despite fentanyl  infusion and sedation.  eICU Interventions  Add fentanyl  bolus from bag   2308 -restraints as needed for patient safety  2326 -patient was severely agitated, called into the room and patient was being held down by multiple staff members, restraints are pending, patient was administered Versed  IV push and fentanyl  bolus from bag without substantial effect.  Eventually, accumulative effect of medicines led to a calm patient.  Currently on Precedex  1.2 and propofol 80.  Will add Versed  pushes as needed for maintenance of RASS.  [phone] -remains persistently agitated despite escalation of support.  At the time of direct evaluation, the patient is calm and synchronous with the ventilator but has had several episodes of dyssynchrony and agitation.  Will escalate dosage of fentanyl  and Versed  to help achieve RASS/CPOT goals  Intervention Category Minor Interventions: Agitation / anxiety - evaluation and management  Shunda Rabadi 01/18/2024, 11:07 PM

## 2024-01-18 NOTE — Progress Notes (Signed)
 eLink Physician-Brief Progress Note Patient Name: ANEESH FALLER DOB: 26-Jul-1981 MRN: [id number]   Date of Service  01/18/2024  HPI/Events of Note  43 year old male with alcohol use disorder who presents with sepsis and shock secondary to aspiration pneumonia requiring intubation and mechanical ventilation.  Undergoing severe alcohol withdrawal.  Vital signs reviewed, currently on Precedex , fentanyl , and propofol infusions.  Adequate ventilation and oxygenation.  Fairly unremarkable labs.  Imaging consistent with displaced NG tube.  eICU Interventions  Replace NG tube  Maintain broad-spectrum antibiotics  CIWA protocol with propofol, fentanyl , and Precedex .  Multivitamins.  DVT prophylaxis with heparin  GI prophylaxis with famotidine     Intervention Category Evaluation Type: New Patient Evaluation  Linnet Bottari 01/18/2024, 7:02 AM

## 2024-01-18 NOTE — Progress Notes (Signed)
Notified Lab that ABG being sent for analysis. 

## 2024-01-18 NOTE — Progress Notes (Signed)
 01/18/2024 Seen for resp failure. Phenobarb taper, check UA, if no lactate clearance check ceribel Continue vent bundle Hopefully can wean vent tomorrow.  Ardelle Kos MD PCCM

## 2024-01-18 NOTE — Progress Notes (Signed)
 Initial Nutrition Assessment  INTERVENTION:   Monitor magnesium , potassium, and phosphorus for at least 3 days, MD to replete as needed, as pt is at risk for refeeding syndrome.  -Initiate Vital 1.5 @ 20 ml/hr, advance by 10 ml every 12 hours to goal rate of 65 ml/hr.  -60 ml Prosource TF 20 daily -Provides 2420 kcals, 125g protein and 1191 ml H2O  NUTRITION DIAGNOSIS:   Increased nutrient needs related to chronic illness as evidenced by estimated needs  GOAL:   Patient will meet greater than or equal to 90% of their needs  MONITOR:   Labs, TF tolerance, Vent status  REASON FOR ASSESSMENT:   Consult Enteral/tube feeding initiation and management  ASSESSMENT:   43 y.o. male history of EtOH abuse, hypertension, hepatic encephalopathy , admitted for altered mental status.  Patient receiving Cortrak tube today for enteral nutrition. Intubated this AM. Has been agitated with AMS. Has been consuming alcohol daily. In severe withdrawal.   Patient is currently intubated on ventilator support MV: 16 L/min Temp (24hrs), Avg:99.5 F (37.5 C), Min:97.9 F (36.6 C), Max:100.9 F (38.3 C)  Propofol: 39.7 ml/hr ->1048 fat kcals  Medications: Pepcid, Folic acid , Lactulose , Thiamine , Precedex , Fentanyl , Lactated ringers   Labs reviewed: CBGs: 111-115  NUTRITION - FOCUSED PHYSICAL EXAM:  Unable to complete  Diet Order:   Diet Order             Diet NPO time specified Except for: Sips with Meds  Diet effective now                   EDUCATION NEEDS:   Not appropriate for education at this time  Skin:  Skin Assessment: Reviewed RN Assessment  Last BM:  rectal tube  Height:   Ht Readings from Last 1 Encounters:  01/18/24 5\' 10"  (1.778 m)    Weight:   Wt Readings from Last 1 Encounters:  01/18/24 125.2 kg    BMI:  Body mass index is 39.6 kg/m.  Estimated Nutritional Needs:   Kcal:  7829-5621  Protein:  115-125g  Fluid:  2L/day   Arna Better,  MS, RD, LDN Inpatient Clinical Dietitian Contact via Secure chat

## 2024-01-18 NOTE — Procedures (Signed)
 Intubation Procedure Note  Juan Clarke  [id number]  01/25/1981  Date:01/18/24  Time:3:00 AM   Provider Performing:Colene Mines Mason Sole    Procedure: Intubation (31500)  Indication(s) Respiratory Failure  Consent Unable to obtain consent due to emergent nature of procedure.   Anesthesia Etomidate , Versed , Rocuronium, and Propofol   Time Out Verified patient identification, verified procedure, site/side was marked, verified correct patient position, special equipment/implants available, medications/allergies/relevant history reviewed, required imaging and test results available.   Sterile Technique Usual hand hygeine, masks, and gloves were used   Procedure Description Patient positioned in bed supine.  Sedation given as noted above.  Patient was intubated with endotracheal tube using Glidescope.  View was Grade 1 full glottis .  Number of attempts was 1.  Colorimetric CO2 detector was consistent with tracheal placement.   Complications/Tolerance None; patient tolerated the procedure well. Chest X-ray is ordered to verify placement.   EBL 0   Specimen(s) None

## 2024-01-18 NOTE — H&P (Signed)
 NAME:  Juan Clarke, MRN:  [id number], DOB:  01/15/1981, LOS: 0 ADMISSION DATE:  01/17/2024, CONSULTATION DATE:  01/18/2024 REFERRING MD:  Eldon Greenland, MD, CHIEF COMPLAINT:  ETOH withdrawal   History of Present Illness:  Patient seen in ED.  He is a 43 y/o male with PMH for Alcoholism, ETOH abuse, Severe major depression, Trigeminal Neuralgia, HTN, Gout and Hypothyroidism who presented to ED via EMS after he was found passed out on the cough.  He was able to eventually arouse but very confused and not able to effectively communicate.  In the ED patient very diaphoretic and confused.  However, he became increasingly more agitated.  He has received multiples doses of Ativan  and received 200mg  IV Phenobarbital .  However, he had to be placed on 4 point restraints.  His CIWA score was above 21.   ED labs revealed an increased LA 4.1, Ammonia level 125. Pertinent  Medical History    Alcoholism F10.20 ; Severe major depression F32.2 ; Trigeminal neuralgia G50.0 ; Acquired hypothyroidism E03.9 ; Mixed hyperlipidemia E78.2 ; Essential (primary) hypertension I10 ; Idiopathic chronic gout without tophus,     Significant Hospital Events: Including procedures, antibiotic start and stop dates in addition to other pertinent events   5/21 Intubated and admitted to ICU  Interim History / Subjective:  N/A  Objective    Blood pressure (!) 173/97, pulse (!) 168, temperature 97.9 F (36.6 C), temperature source Rectal, resp. rate (!) 32, height 5\' 10"  (1.778 m), weight 120.2 kg, SpO2 97%.    Vent Mode: PRVC FiO2 (%):  [100 %] 100 % Set Rate:  [18 bmp] 18 bmp Vt Set:  [580 mL] 580 mL PEEP:  [5 cmH20] 5 cmH20 Plateau Pressure:  [17 cmH20] 17 cmH20  No intake or output data in the 24 hours ending 01/18/24 0243 Filed Weights   01/17/24 2241  Weight: 120.2 kg    Examination: General: Very agitated and diaphoretic in 4 point restraints-hard restraints HENT: PERRLA no icterus, EOMI, snoring at  times Lungs: CTA no wheezes no rales no rhonchi Cardiovascular: Reg s1s2, tachy Abdomen: soft obese nt nd bs pos no guarding Extremities: no cyanosis, clubbing or edema Neuro: not able to communicate, altered, moving all extremities, agitated   Resolved problem list   Assessment and Plan  Acute ETOH withdrawal He was started on Precedex  drip prior to my arrival in ED as he was agitated despite receiving Ativan  and Phenobarbital  High Ammonia may also contribute to his AMS Will give Thiamine  and Folic Acid  Will give Lactulose  Acute respiratory failure, hypoxic type Patient intubated for airways protection Follow ABG for optimal Vent management Possible infiltrate Left Lung-he may have aspirated Culture tracheal aspirates Broad spectrum antibiotics Delirium Tremens Support with Propofol;, Precedex  and ad Fentanyl  while intubated Prn Ativan  Lactic acidosis IVF should improve his Lactic Acidosis, likely from his withdrawal unlikely to be from ischemic bowel  Best Practice (right click and "Reselect all SmartList Selections" daily)   Diet/type: tubefeeds DVT prophylaxis prophylactic heparin   Pressure ulcer(s): N/A GI prophylaxis: H2B Lines: N/A Foley:  Yes, and it is still needed Code Status:  full code   Labs   CBC: Recent Labs  Lab 01/17/24 2245  WBC 7.5  NEUTROABS 5.5  HGB 17.1*  HCT 48.9  MCV 85.6  PLT 281    Basic Metabolic Panel: Recent Labs  Lab 01/17/24 2245  NA 137  K 3.7  CL 101  CO2 24  GLUCOSE 156*  BUN 12  CREATININE  0.92  CALCIUM  9.2  MG 2.2   GFR: Estimated Creatinine Clearance: 137.4 mL/min (by C-G formula based on SCr of 0.92 mg/dL). Recent Labs  Lab 01/17/24 2245 01/17/24 2339 01/18/24 0045  WBC 7.5  --   --   LATICACIDVEN  --  4.0* 4.1*    Liver Function Tests: Recent Labs  Lab 01/17/24 2245  AST 89*  ALT 149*  ALKPHOS 116  BILITOT 0.8  PROT 7.9  ALBUMIN 5.0   No results for input(s): "LIPASE", "AMYLASE" in the last  168 hours. Recent Labs  Lab 01/17/24 2342  AMMONIA 125*    ABG    Component Value Date/Time   HCO3 25.0 01/18/2024 0037   TCO2 22 08/10/2023 0810   ACIDBASEDEF 5.5 (H) 08/10/2023 2150   O2SAT 69.9 01/18/2024 0037     Coagulation Profile: No results for input(s): "INR", "PROTIME" in the last 168 hours.  Cardiac Enzymes: No results for input(s): "CKTOTAL", "CKMB", "CKMBINDEX", "TROPONINI" in the last 168 hours.  HbA1C: No results found for: "HGBA1C"  CBG: Recent Labs  Lab 01/17/24 2232  GLUCAP 152*    Review of Systems:   Unable to obtain secondary to his withdrawal and severe agitation  Past Medical History:  He,  has a past medical history of GAD (generalized anxiety disorder), HTN (hypertension), Insomnia, Migraine without aura, Overweight, and Trigeminal neuralgia.   Surgical History:   Past Surgical History:  Procedure Laterality Date   Face surgery  11/14/14   TONSILLECTOMY     43 yr old     Social History:   reports that he has never smoked. He has never used smokeless tobacco. He reports current alcohol use of about 42.0 standard drinks of alcohol per week. He reports current drug use. Frequency: 7.00 times per week. Drug: Marijuana.   Family History:  His family history includes Brain cancer in his maternal grandmother; Drug abuse in his sister; Heart disease in his paternal grandfather; Hypertension in his father; Mental illness in his sister; Migraines in his mother.   Allergies Allergies  Allergen Reactions   Lexapro [Escitalopram Oxalate] Other (See Comments)    Lack of therapeutic effect    Zoloft [Sertraline Hcl] Other (See Comments)    Lack of therapeutic effect      Home Medications  Prior to Admission medications   Medication Sig Start Date End Date Taking? Authorizing Provider  acamprosate (CAMPRAL) 333 MG tablet Take 2 tablets by mouth 3 (three) times daily.    [provider]  allopurinol  (ZYLOPRIM ) 300 MG tablet Take 300 mg  by mouth daily.    [provider]  atorvastatin  (LIPITOR) 10 MG tablet Take 10 mg by mouth daily. 03/08/19   [provider]  clonazePAM  (KLONOPIN ) 0.5 MG tablet Take 0.5 mg by mouth 2 (two) times daily as needed for anxiety.  06/07/14   [provider]  DULoxetine  (CYMBALTA ) 60 MG capsule Take 60 mg by mouth daily. 07/27/23   [provider]  levothyroxine  (SYNTHROID ) 50 MCG tablet Take 50 mcg by mouth daily before breakfast. 01/03/19   [provider]  lisdexamfetamine (VYVANSE) 30 MG capsule Take 30 mg by mouth daily.    [provider]  lisinopril -hydrochlorothiazide (ZESTORETIC) 20-25 MG tablet Take 1 tablet by mouth daily. 12/15/18   [provider]  ondansetron  (ZOFRAN  ODT) 8 MG disintegrating tablet 8mg  ODT q8 hours prn nausea Patient taking differently: Take 8 mg by mouth every 8 (eight) hours as needed for nausea. 03/13/19  Palumbo, April, MD  PARoxetine  (PAXIL ) 20 MG tablet Take 20 mg by mouth daily. 02/10/19   [provider]     Critical care time: 18   The patient is critically ill with multiple organ system failure and requires high complexity decision making for assessment and support, frequent evaluation and titration of therapies, advanced monitoring, review of radiographic studies and interpretation of complex data.   Critical Care Time devoted to patient care services, exclusive of separately billable procedures, described in this note is 33 minutes.   Claven Cumming, MD [facility] See Amion for pager  If no response to pager , please call [phone] until 7pm After 7:00 pm call Elink  [phone] 01/18/2024, 2:43 AM

## 2024-01-18 NOTE — Progress Notes (Signed)
   01/18/24 1520  TOC Brief Assessment  Insurance and Status Reviewed  Patient has primary care physician Yes  Home environment has been reviewed Single family home  Prior level of function: Independent  Prior/Current Home Services No current home services  Social Drivers of Health Review SDOH reviewed no interventions necessary  Readmission risk has been reviewed Yes (19%)  Transition of care needs transition of care needs identified, TOC will continue to follow   Pt screened. He is currently ventilated and will require more days in ICU. There were no needs identified by TOC at this time, but we will continue to follow if any new needs arise during this admission.

## 2024-01-19 ENCOUNTER — Inpatient Hospital Stay (HOSPITAL_COMMUNITY)

## 2024-01-19 DIAGNOSIS — I10 Essential (primary) hypertension: Secondary | ICD-10-CM

## 2024-01-19 DIAGNOSIS — E8729 Other acidosis: Secondary | ICD-10-CM | POA: Diagnosis not present

## 2024-01-19 DIAGNOSIS — J9601 Acute respiratory failure with hypoxia: Secondary | ICD-10-CM | POA: Diagnosis not present

## 2024-01-19 DIAGNOSIS — F10931 Alcohol use, unspecified with withdrawal delirium: Secondary | ICD-10-CM | POA: Diagnosis not present

## 2024-01-19 DIAGNOSIS — R319 Hematuria, unspecified: Secondary | ICD-10-CM

## 2024-01-19 DIAGNOSIS — E785 Hyperlipidemia, unspecified: Secondary | ICD-10-CM

## 2024-01-19 LAB — BASIC METABOLIC PANEL WITH GFR
Anion gap: 18 — ABNORMAL HIGH (ref 5–15)
BUN: 18 mg/dL (ref 6–20)
CO2: 15 mmol/L — ABNORMAL LOW (ref 22–32)
Calcium: 8.4 mg/dL — ABNORMAL LOW (ref 8.9–10.3)
Chloride: 107 mmol/L (ref 98–111)
Creatinine, Ser: 1.04 mg/dL (ref 0.61–1.24)
GFR, Estimated: >60 mL/min (ref >60)
Glucose, Bld: 96 mg/dL (ref 70–99)
Potassium: 3.8 mmol/L (ref 3.5–5.1)
Sodium: 140 mmol/L (ref 135–145)

## 2024-01-19 LAB — GLUCOSE, CAPILLARY
Glucose-Capillary: 108 mg/dL — ABNORMAL HIGH (ref 70–99)
Glucose-Capillary: 110 mg/dL — ABNORMAL HIGH (ref 70–99)
Glucose-Capillary: 117 mg/dL — ABNORMAL HIGH (ref 70–99)
Glucose-Capillary: 121 mg/dL — ABNORMAL HIGH (ref 70–99)
Glucose-Capillary: 92 mg/dL (ref 70–99)
Glucose-Capillary: 98 mg/dL (ref 70–99)

## 2024-01-19 LAB — POCT I-STAT 7, (LYTES, BLD GAS, ICA,H+H)
Acid-base deficit: 3 mmol/L — ABNORMAL HIGH (ref 0.0–2.0)
Bicarbonate: 23.4 mmol/L (ref 20.0–28.0)
Calcium, Ion: 1.21 mmol/L (ref 1.15–1.40)
HCT: 36 % — ABNORMAL LOW (ref 39.0–52.0)
Hemoglobin: 12.2 g/dL — ABNORMAL LOW (ref 13.0–17.0)
O2 Saturation: 96 %
Patient temperature: 98.6
Potassium: 3.9 mmol/L (ref 3.5–5.1)
Sodium: 138 mmol/L (ref 135–145)
TCO2: 25 mmol/L (ref 22–32)
pCO2 arterial: 46.1 mmHg (ref 32–48)
pH, Arterial: 7.313 — ABNORMAL LOW (ref 7.35–7.45)
pO2, Arterial: 86 mmHg (ref 83–108)

## 2024-01-19 LAB — CBC
HCT: 40.9 % (ref 39.0–52.0)
Hemoglobin: 13.8 g/dL (ref 13.0–17.0)
MCH: 31.2 pg (ref 26.0–34.0)
MCHC: 33.7 g/dL (ref 30.0–36.0)
MCV: 92.5 fL (ref 80.0–100.0)
Platelets: 156 10*3/uL (ref 150–400)
RBC: 4.42 MIL/uL (ref 4.22–5.81)
RDW: 13.1 % (ref 11.5–15.5)
WBC: 12.6 10*3/uL — ABNORMAL HIGH (ref 4.0–10.5)
nRBC: 0.2 % (ref 0.0–0.2)

## 2024-01-19 LAB — PHOSPHORUS: Phosphorus: 4.6 mg/dL (ref 2.5–4.6)

## 2024-01-19 LAB — MAGNESIUM: Magnesium: 2.4 mg/dL (ref 1.7–2.4)

## 2024-01-19 LAB — LACTIC ACID, PLASMA: Lactic Acid, Venous: 1.2 mmol/L (ref 0.5–1.9)

## 2024-01-19 LAB — TRIGLYCERIDES
Triglycerides: 2482 mg/dL — ABNORMAL HIGH (ref <150)
Triglycerides: 2586 mg/dL — ABNORMAL HIGH (ref <150)

## 2024-01-19 LAB — LIPASE, BLOOD: Lipase: 21 U/L (ref 11–51)

## 2024-01-19 MED ORDER — DIPHENHYDRAMINE HCL 50 MG/ML IJ SOLN: 25.0000 mg | Freq: Once | INTRAMUSCULAR | Status: DC | PRN

## 2024-01-19 MED ORDER — SODIUM CHLORIDE 0.9 % IV SOLN
260.0000 mg | Freq: Two times a day (BID) | INTRAVENOUS | Status: AC
  Administered 2024-01-19 (×2): 260 mg via INTRAVENOUS
  Filled 2024-01-19 (×2): qty 2

## 2024-01-19 MED ORDER — MIDAZOLAM BOLUS VIA INFUSION
0.0000 mg | INTRAVENOUS | Status: DC | PRN
  Administered 2024-01-19: 2 mg via INTRAVENOUS
  Administered 2024-01-19: 4 mg via INTRAVENOUS
  Administered 2024-01-20: 2 mg via INTRAVENOUS
  Administered 2024-01-20: 5 mg via INTRAVENOUS
  Administered 2024-01-20: 3 mg via INTRAVENOUS

## 2024-01-19 MED ORDER — PHENOBARBITAL SODIUM 130 MG/ML IJ SOLN
130.0000 mg | Freq: Two times a day (BID) | INTRAMUSCULAR | Status: AC
  Administered 2024-01-20 (×2): 130 mg via INTRAVENOUS
  Filled 2024-01-19 (×2): qty 1

## 2024-01-19 MED ORDER — KETOROLAC TROMETHAMINE 15 MG/ML IJ SOLN
15.0000 mg | Freq: Once | INTRAMUSCULAR | Status: AC
  Administered 2024-01-19: 15 mg via INTRAVENOUS
  Filled 2024-01-19: qty 1

## 2024-01-19 MED ORDER — HALOPERIDOL LACTATE 5 MG/ML IJ SOLN: 5.0000 mg | Freq: Once | INTRAMUSCULAR | Status: DC | PRN

## 2024-01-19 MED ORDER — LORAZEPAM 2 MG/ML IJ SOLN: 2.0000 mg | Freq: Once | INTRAMUSCULAR | Status: DC | PRN

## 2024-01-19 MED ORDER — PHENOBARBITAL SODIUM 65 MG/ML IJ SOLN
65.0000 mg | Freq: Every day | INTRAMUSCULAR | Status: AC
  Administered 2024-01-21 – 2024-01-23 (×3): 65 mg via INTRAVENOUS
  Filled 2024-01-19 (×3): qty 1

## 2024-01-19 MED ORDER — MIDAZOLAM HCL 2 MG/2ML IJ SOLN
4.0000 mg | INTRAMUSCULAR | Status: DC | PRN
  Administered 2024-01-19: 4 mg via INTRAVENOUS
  Filled 2024-01-19: qty 4

## 2024-01-19 MED ORDER — KETAMINE HCL-SODIUM CHLORIDE 1000-0.69 MG/100ML-% IV SOLN
0.5000 mg/kg/h | INTRAVENOUS | Status: DC
  Administered 2024-01-19 (×2): 0.5 mg/kg/h via INTRAVENOUS
  Filled 2024-01-19 (×2): qty 100

## 2024-01-19 MED ORDER — FENTANYL BOLUS VIA INFUSION
50.0000 ug | INTRAVENOUS | Status: DC | PRN
  Administered 2024-01-19 – 2024-01-23 (×47): 50 ug via INTRAVENOUS

## 2024-01-19 MED ORDER — MIDAZOLAM-SODIUM CHLORIDE 100-0.9 MG/100ML-% IV SOLN
0.0000 mg/h | INTRAVENOUS | Status: DC
  Administered 2024-01-19: 2 mg/h via INTRAVENOUS
  Administered 2024-01-20: 6 mg/h via INTRAVENOUS
  Filled 2024-01-19 (×2): qty 100

## 2024-01-19 MED ORDER — METOCLOPRAMIDE HCL 5 MG/ML IJ SOLN
10.0000 mg | Freq: Three times a day (TID) | INTRAMUSCULAR | Status: AC
Start: 2024-01-19 — End: 2024-01-20
  Administered 2024-01-19 – 2024-01-20 (×4): 10 mg via INTRAVENOUS
  Filled 2024-01-19 (×4): qty 2

## 2024-01-19 MED ORDER — LACTATED RINGERS IV BOLUS
1000.0000 mL | Freq: Once | INTRAVENOUS | Status: AC
  Administered 2024-01-19: 1000 mL via INTRAVENOUS

## 2024-01-19 NOTE — Progress Notes (Signed)
 NAME:  Juan Clarke, MRN:  [id number], DOB:  [birth year], LOS: 1 ADMISSION DATE:  01/17/2024, CONSULTATION DATE:  01/18/2024 REFERRING MD:  Eldon Greenland, MD, CHIEF COMPLAINT:  ETOH withdrawal   History of Present Illness:  Patient seen in ED.  He is a 43 y/o male with PMH for Alcoholism, ETOH abuse, Severe major depression, Trigeminal Neuralgia, HTN, Gout and Hypothyroidism who presented to ED via EMS after he was found passed out on the cough.  He was able to eventually arouse but very confused and not able to effectively communicate.  In the ED patient very diaphoretic and confused.  However, he became increasingly more agitated.  He has received multiples doses of Ativan  and received 200mg  IV Phenobarbital .  However, he had to be placed on 4 point restraints.  His CIWA score was above 21.   ED labs revealed an increased LA 4.1, Ammonia level 125. Pertinent  Medical History    Alcoholism F10.20 ; Severe major depression F32.2 ; Trigeminal neuralgia G50.0 ; Acquired hypothyroidism E03.9 ; Mixed hyperlipidemia E78.2 ; Essential (primary) hypertension I10 ; Idiopathic chronic gout without tophus,     Significant Hospital Events: Including procedures, antibiotic start and stop dates in addition to other pertinent events   5/21 Intubated and admitted to ICU 5/22 Worsening agitation overnight with increasing sedation requirements   Interim History / Subjective:  Increased agitation overnight while intubated and requiring increased sedation amounts, foley removed by patient during agitation episodes  Vomiting around ETT, aspiration    Objective    Blood pressure (!) 143/83, pulse 81, temperature 98.5 F (36.9 C), temperature source Axillary, resp. rate (!) 7, height 5\' 10"  (1.778 m), weight 129 kg, SpO2 95%.    Vent Mode: PSV FiO2 (%):  [30 %] 30 % PEEP:  [5 cmH20] 5 cmH20 Pressure Support:  [5 cmH20] 5 cmH20 Plateau Pressure:  [21 cmH20] 21 cmH20   Intake/Output Summary (Last 24  hours) at 01/19/2024 1191 Last data filed at 01/19/2024 0813 Gross per 24 hour  Intake 6289.99 ml  Output 2536 ml  Net 3753.99 ml   Filed Weights   01/17/24 2241 01/18/24 0700 01/19/24 0346  Weight: 120.2 kg 125.2 kg 129 kg    Examination: General: acute on chronic adult male , lying in icu bed on vent-no acute distress HEENT: Normocephalic, PERRLA intact, ETT, core track in place ,Pink MM CV: s1,s2, RRR, no MRG, No JVD  pulm: Clear, diminished, oral secretions suctioned, no distress on vent  Abs: Obese bs active, soft  Extremities: Nonpitting edema, no deformity, moves to painful stimuli Skin: no rash  Neuro: Rass -2 to -3 responds to painful stimuli, cough gag reflex present  GU: Condom cath, bloody urine-from traumatic removal  Resolved problem list   Assessment and Plan   Acute Metabolic Encephalopathy  Acute ETOH withdrawal, Delirium Tremens  He was started on Precedex  drip prior to my arrival in ED as he was agitated despite receiving Ativan  and Phenobarbital . High Ammonia may also contribute to his AMS Intubated for airway protection  5/22 Increased agitation overnight, requiring increased amounts of sedation- fentanyl , prop, versed   With phenobarb taper P:  Reload phenobarbital  bolus and increased doses of taper Start ketamine infusion, plan will be to start ketamine infusion  - Then began to wean off fentanyl  and propofol Continue Precedex  Will give B52 cocktail and Reglan Once patient is off fentanyl  and propofol, will attempt to extubate Continue to keep tube feeds off via cortrak  Continue thiamine , folic  acid, multivitamin Continue lactulose  Elevated triglycerides noted on morning labs-will repeat, highly suspect this is a false reading from lab draw  Acute Hypoxic Respiratory Failure secondary to above  Patient did have another aspiration event during overnight of 5/21 to 5/22 P:  Continue ventilatory support and lung protective strategies Continue  LTVV Plan to start ketamine infusion, wean off propofol and fentanyl  Continue Precedex , will try to extubate later on today on 5/22 Head of bed greater than 30 degrees Aspiration precautions Continue to follow blood and tracheal aspirate cultures Continue Unasyn for aspiration coverage Continue MAP and PAD protocols RASS goal 0 to -1 Once extubated will place on end-tidal CO2 monitoring  Lactic acidosis Suspecting possible sepsis from aspiration pneumonia/UTI vs withdrawal  Currently lactate clearing with fluid resuscitation  Received vanc, flagyl, cefepime in ED  MRSA negative  Urine cloudy, urinalysis-trace leukocytes with no bacteria, turbid, negative for ketones and nitrates P:  Repeat lactic acid Continue Unasyn Continue to follow tracheal aspirate and blood cultures  HTN  HLD P:  Continue BP monitoring, BP maintaining normotensive with sedation still Will need to eventually restart antihypertensives Will hold Lipitor or p.o. meds for now due to vomiting around ET tube and core track -Will give Reglan  Hematuria - Traumatic Foley removal during agitation episode by patient -Blood is not frank, urine is dark  P: Continue condom catheter for now Bladder scan every 6 hours Monitor urine output Trend H&H-currently stable   Best Practice (right click and "Reselect all SmartList Selections" daily)   Diet/type: tubefeeds- npo for now  DVT prophylaxis prophylactic heparin   Pressure ulcer(s): N/A GI prophylaxis: H2B Lines: N/A Foley:  Yes, and it is still needed Code Status:  full code  Critical care time: 45 mins    The patient is critically ill with multiple organ system failure and requires high complexity decision making for assessment and support, frequent evaluation and titration of therapies, advanced monitoring, review of radiographic studies and interpretation of complex data.   Critical Care Time devoted to patient care services, exclusive of separately  billable procedures, described in this note is 33 minutes.  Rey Catholic AGACNP-BC   [facility] 01/19/2024, 9:23 AM  Please see [email or web address] for pager details.  From 7A-7P if no response, please call [phone]. After hours, please call ELink [phone].

## 2024-01-19 NOTE — Plan of Care (Signed)
  Problem: Coping: Goal: Ability to adjust to condition or change in health will improve Outcome: Not Progressing   Problem: Nutritional: Goal: Maintenance of adequate nutrition will improve Outcome: Not Progressing   Problem: Education: Goal: Knowledge of General Education information will improve Description: Including pain rating scale, medication(s)/side effects and non-pharmacologic comfort measures Outcome: Not Progressing   Problem: Clinical Measurements: Goal: Diagnostic test results will improve Outcome: Not Progressing

## 2024-01-19 NOTE — Progress Notes (Signed)
 Spoke to wife, Mansfield Seip this morning and updated her on overnight events with difficulty maintaining sedation. She asked that physicians call her or his mom if unable to reach her this morning and provide update, answer questions about labs and prognosis.

## 2024-01-20 ENCOUNTER — Inpatient Hospital Stay (HOSPITAL_COMMUNITY)

## 2024-01-20 DIAGNOSIS — R509 Fever, unspecified: Secondary | ICD-10-CM

## 2024-01-20 DIAGNOSIS — E781 Pure hyperglyceridemia: Secondary | ICD-10-CM

## 2024-01-20 DIAGNOSIS — J9601 Acute respiratory failure with hypoxia: Secondary | ICD-10-CM | POA: Diagnosis not present

## 2024-01-20 DIAGNOSIS — G9341 Metabolic encephalopathy: Secondary | ICD-10-CM

## 2024-01-20 DIAGNOSIS — F10931 Alcohol use, unspecified with withdrawal delirium: Secondary | ICD-10-CM | POA: Diagnosis not present

## 2024-01-20 LAB — CBC
HCT: 42.8 % (ref 39.0–52.0)
Hemoglobin: 13.5 g/dL (ref 13.0–17.0)
MCH: 30.5 pg (ref 26.0–34.0)
MCHC: 31.5 g/dL (ref 30.0–36.0)
MCV: 96.6 fL (ref 80.0–100.0)
Platelets: 158 10*3/uL (ref 150–400)
RBC: 4.43 MIL/uL (ref 4.22–5.81)
RDW: 12.8 % (ref 11.5–15.5)
WBC: 9.5 10*3/uL (ref 4.0–10.5)
nRBC: 0 % (ref 0.0–0.2)

## 2024-01-20 LAB — LIPID PANEL
Cholesterol: 254 mg/dL — ABNORMAL HIGH (ref 0–200)
Triglycerides: 1358 mg/dL — ABNORMAL HIGH (ref <150)
VLDL: UNDETERMINED mg/dL (ref 0–40)

## 2024-01-20 LAB — BASIC METABOLIC PANEL WITH GFR
Anion gap: 9 (ref 5–15)
BUN: 20 mg/dL (ref 6–20)
CO2: 22 mmol/L (ref 22–32)
Calcium: 8.3 mg/dL — ABNORMAL LOW (ref 8.9–10.3)
Chloride: 108 mmol/L (ref 98–111)
Creatinine, Ser: 1.07 mg/dL (ref 0.61–1.24)
GFR, Estimated: >60 mL/min (ref >60)
Glucose, Bld: 97 mg/dL (ref 70–99)
Potassium: 4.2 mmol/L (ref 3.5–5.1)
Sodium: 139 mmol/L (ref 135–145)

## 2024-01-20 LAB — GLUCOSE, CAPILLARY
Glucose-Capillary: 102 mg/dL — ABNORMAL HIGH (ref 70–99)
Glucose-Capillary: 103 mg/dL — ABNORMAL HIGH (ref 70–99)
Glucose-Capillary: 107 mg/dL — ABNORMAL HIGH (ref 70–99)
Glucose-Capillary: 111 mg/dL — ABNORMAL HIGH (ref 70–99)
Glucose-Capillary: 138 mg/dL — ABNORMAL HIGH (ref 70–99)
Glucose-Capillary: 65 mg/dL — ABNORMAL LOW (ref 70–99)
Glucose-Capillary: 73 mg/dL (ref 70–99)
Glucose-Capillary: 77 mg/dL (ref 70–99)

## 2024-01-20 LAB — PHOSPHORUS: Phosphorus: 3.9 mg/dL (ref 2.5–4.6)

## 2024-01-20 LAB — MAGNESIUM: Magnesium: 2.4 mg/dL (ref 1.7–2.4)

## 2024-01-20 MED ORDER — LORAZEPAM 2 MG/ML IJ SOLN
1.0000 mg | INTRAMUSCULAR | Status: DC | PRN
  Administered 2024-01-20 – 2024-01-24 (×18): 2 mg via INTRAVENOUS
  Filled 2024-01-20 (×19): qty 1

## 2024-01-20 MED ORDER — LACTULOSE 10 GM/15ML PO SOLN
20.0000 g | Freq: Two times a day (BID) | ORAL | Status: DC
  Administered 2024-01-20 (×2): 20 g
  Filled 2024-01-20 (×2): qty 30

## 2024-01-20 MED ORDER — MIDAZOLAM HCL 2 MG/2ML IJ SOLN
5.0000 mg | Freq: Once | INTRAMUSCULAR | Status: AC
  Administered 2024-01-20: 5 mg via INTRAVENOUS

## 2024-01-20 MED ORDER — DEXTROSE 50 % IV SOLN
12.5000 g | INTRAVENOUS | Status: AC
  Administered 2024-01-20: 12.5 g via INTRAVENOUS
  Filled 2024-01-20: qty 50

## 2024-01-20 MED ORDER — DEXTROSE 10 % IV SOLN: INTRAVENOUS | Status: DC

## 2024-01-20 MED ORDER — FAMOTIDINE IN NACL 20-0.9 MG/50ML-% IV SOLN
20.0000 mg | Freq: Two times a day (BID) | INTRAVENOUS | Status: DC
  Administered 2024-01-20 (×2): 20 mg via INTRAVENOUS
  Filled 2024-01-20 (×2): qty 50

## 2024-01-20 MED ORDER — DEXTROSE 50 % IV SOLN: 25.0000 mL | Freq: Once | INTRAVENOUS | Status: AC

## 2024-01-20 MED ORDER — KETAMINE HCL-SODIUM CHLORIDE 1000-0.69 MG/100ML-% IV SOLN
2.5000 mg/kg/h | INTRAVENOUS | Status: DC
  Administered 2024-01-20: 2 mg/kg/h via INTRAVENOUS
  Administered 2024-01-20 (×2): 1 mg/kg/h via INTRAVENOUS
  Administered 2024-01-21 (×2): 2.5 mg/kg/h via INTRAVENOUS
  Administered 2024-01-21 (×3): 2 mg/kg/h via INTRAVENOUS
  Administered 2024-01-21 – 2024-01-22 (×5): 2.5 mg/kg/h via INTRAVENOUS
  Filled 2024-01-20 (×14): qty 100

## 2024-01-20 MED ORDER — LORAZEPAM 1 MG PO TABS
1.0000 mg | ORAL_TABLET | Freq: Three times a day (TID) | ORAL | Status: DC
  Administered 2024-01-20 (×2): 1 mg
  Filled 2024-01-20 (×2): qty 1

## 2024-01-20 NOTE — Progress Notes (Signed)
 Nutrition Follow-up  DOCUMENTATION CODES:      INTERVENTION:  - Once able to restart tube feeds, recommend: Vital 1.5 with goal rate of 65 ml/hr.  *Recommend restarting at 20 mL/hr and advanccing by 10 mL Q12H + 60 ml Prosource TF 20 daily Provides 2420 kcals, 125g protein and 1191 ml H2O  Monitor magnesium , potassium, and phosphorus for at least 3 days, MD to replete as needed, as pt is at risk for refeeding syndrome.   - If unable to restart tube feeds in the next 24-48 hours, recommend recommend consideration for TPN.    NUTRITION DIAGNOSIS:   Increased nutrient needs related to chronic illness as evidenced by estimated needs. *ongoing  GOAL:   Patient will meet greater than or equal to 90% of their needs *not met  MONITOR:   Labs, TF tolerance, Vent status  REASON FOR ASSESSMENT:   Consult Enteral/tube feeding initiation and management  ASSESSMENT:   43 y.o. male history of EtOH abuse, hypertension, hepatic encephalopathy , admitted for altered mental status.  5/21 Admit; Intubated; Cortrak placed - TF initiated 5/22 TF held after patient vomited  Patient remains intubated on ventilator support MV: 11.8 L/min Temp (24hrs), Avg:102.1 F (38.9 C), Min:99.5 F (37.5 C), Max:104 F (40 C)  TF's held since yesterday after a vomiting/aspiration event.  Per CCM, abdomen firm this AM. Patient now with ileus. NGT placed for suction with out overnight.  Plan to continue holding tube feeds for now. Cortrak remains in place.   Admit weight: 265# Current weight: 286# I&O's: +11.5L  Medications reviewed and include: 1mg  folic acid , 100mg  thiamine  daily, Lactulose  BID, D10@ 46mL/hr (provides 245 kcals over 24 hours) Precedex  Fentanyl  Versed   Labs reviewed:  -   NUTRITION - FOCUSED PHYSICAL EXAM:  Will attempt at next visit   Diet Order:   Diet Order             Diet NPO time specified Except for: Sips with Meds  Diet effective now                    EDUCATION NEEDS:  Not appropriate for education at this time  Skin:  Skin Assessment: Reviewed RN Assessment  Last BM:  5/22 - rectal tube  Height:  Ht Readings from Last 1 Encounters:  01/20/24 5\' 10"  (1.778 m)   Weight:  Wt Readings from Last 1 Encounters:  01/20/24 130 kg   Ideal Body Weight:  75.45 kg  BMI:  Body mass index is 41.12 kg/m.  Estimated Nutritional Needs:  Kcal:  2250-2450 Protein:  115-125g Fluid:  2L/day    Juan Clarke RD, LDN Contact via Secure Chat.

## 2024-01-20 NOTE — Progress Notes (Signed)
 NAME:  Juan Clarke, MRN:  [id number], DOB:  10-09-1980, LOS: 2 ADMISSION DATE:  01/17/2024, CONSULTATION DATE:  01/18/2024 REFERRING MD:  Eldon Greenland, MD, CHIEF COMPLAINT:  ETOH withdrawal   History of Present Illness:  43 y/o male with  Alcoholism, who presented to ED via EMS after he was found passed out on the cough.  He was able to eventually arouse but very confused and not able to effectively communicate.  In the ED patient very diaphoretic and confused.  However, he became increasingly more agitated.  He has received multiples doses of Ativan  and received 200mg  IV Phenobarbital .  However, he had to be placed on 4 point restraints.  His CIWA score was above 21.   ED labs revealed an increased LA 4.1, Ammonia level 125.   Pertinent  Medical History    Alcoholism F10.20 ; Severe major depression F32.2 ; Trigeminal neuralgia G50.0 ; Acquired hypothyroidism E03.9 ; Mixed hyperlipidemia E78.2 ; Essential (primary) hypertension I10 ; Idiopathic chronic gout without tophus,     Significant Hospital Events: Including procedures, antibiotic start and stop dates in addition to other pertinent events   5/21 Intubated and admitted to ICU 5/22 Worsening agitation overnight with increasing sedation requirements >>propofol dc'd due to high TGS,  ketamine added 5/22 Vomiting around ETT, aspiration    Interim History / Subjective:    Critically ill on high-dose fentanyl , Versed , max Precedex , ketamine added yesterday. Febrile Tmax 104 Abdomen firm, tube feeds held overnight. Urine output 375   Objective    Blood pressure (!) 182/96, pulse (!) 113, temperature (!) 102.4 F (39.1 C), resp. rate (!) 21, height 5\' 10"  (1.778 m), weight 130 kg, SpO2 97%.    Vent Mode: PRVC FiO2 (%):  [30 %-40 %] 40 % Set Rate:  [20 bmp] 20 bmp Vt Set:  [580 mL] 580 mL PEEP:  [5 cmH20] 5 cmH20 Plateau Pressure:  [16 cmH20-30 cmH20] 30 cmH20   Intake/Output Summary (Last 24 hours) at 01/20/2024  6578 Last data filed at 01/20/2024 0701 Gross per 24 hour  Intake 3713.97 ml  Output 975 ml  Net 2738.97 ml   Filed Weights   01/18/24 0700 01/19/24 0346 01/20/24 0117  Weight: 125.2 kg 129 kg 130 kg    Examination: General: acute on chronic adult male , lying in icu bed on vent-no acute distress HEENT: Normocephalic, PERRLA intact, ETT, core track in place , OG with bilious aspirate CV: s1,s2, RRR, no MRG, No JVD  pulm: No accessory muscle use decreased breath sounds bilateral Abs: Obese bs active, soft, distended Extremities: Nonpitting edema, no deformity, moves to painful stimuli Skin: no rash  Neuro: Rass -2 to -3 responds to painful stimuli GU: Condom cath, bloody urine-from traumatic removal  X-ray abdomen shows nonspecific bowel gas pattern. Labs show normal electrolytes, triglycerides decreased from 2500 Piche to 1300, no leukocytosis  Resolved problem list   Assessment and Plan   Acute Metabolic Encephalopathy  Acute ETOH withdrawal, Delirium Tremens  Hyperammonemia Intubated for airway protection   P:  Continue Precedex  and phenobarb taper Continue fentanyl  and Versed , goal RASS 0 to -1. Increase ketamine gradually to max 2 mg an hour Continue thiamine , folic acid , multivitamin Increase lactulose  to 20 twice daily    Acute Hypoxic Respiratory Failure secondary to above  Aspiration pneumonia, witnessed aspiration 5/22 P:  Head of bed greater than 30 degrees Aspiration precautions Not ready for spontaneous breathing trials, vent settings reviewed and adjusted    High-grade fever Lactic  acidosis, resolved - sepsis from aspiration pneumonia/UTI vs withdrawal   P: Continue Unasyn for aspiration coverage, await respiratory culture  Hypertriglyceridemia -worsened by propofol, now DC'd, lipase normal Track TG's daily  Ileus -likely related to high-dose opiates and immobilization  On lactulose  and Reglan Holding tube feeds  HTN  HLD P:  Holding  antihypertensives and Lipitor  Hematuria - Traumatic Foley removal during agitation episode by patient  P: Continue condom catheter for now Bladder scan every 6 hours, reinsert if retention   Best Practice (right click and "Reselect all SmartList Selections" daily)   Diet/type: tubefeeds- npo for now  DVT prophylaxis prophylactic heparin   Pressure ulcer(s): N/A GI prophylaxis: H2B Lines: N/A Foley:  Yes, and it is still needed Code Status:  full code  Critical care time: 35 mins    The patient is critically ill with multiple organ system failure and requires high complexity decision making for assessment and support, frequent evaluation and titration of therapies, advanced monitoring, review of radiographic studies and interpretation of complex data.   Critical Care Time devoted to patient care services, exclusive of separately billable procedures, described in this note is 35 minutes.   Jennifer Moellers Villa Greaser MD  01/20/2024, 8:19 AM  Please see [email or web address] for pager details.  From 7A-7P if no response, please call [phone]. After hours, please call ELink [phone].

## 2024-01-20 NOTE — Progress Notes (Signed)
 eLink Physician-Brief Progress Note Patient Name: Juan Clarke DOB: July 14, 1981 MRN: [id number]   Date of Service  01/20/2024  HPI/Events of Note  Abd firm on exam with 500 cc NG output overnight.  eICU Interventions  Portable abd film ordered     Intervention Category Intermediate Interventions: Other:  Linda Repress, P 01/20/2024, 5:20 AM

## 2024-01-20 NOTE — Plan of Care (Signed)
   Problem: Safety: Goal: Violent Restraint(s) Outcome: Completed/Met

## 2024-01-20 NOTE — Progress Notes (Signed)
 eLink Physician-Brief Progress Note Patient Name: Juan Clarke DOB: Jul 27, 1981 MRN: [id number]   Date of Service  01/20/2024  HPI/Events of Note  Ongoing restless agitation related to alcohol withdrawal.  Remains on precedex , fentanyl , ketamine, versed  drips along with phenobarb taper.  eICU Interventions  Additional bolus of versed  5 mg IV given otherwise continue to follow titration orders     Intervention Category Major Interventions: Delirium, psychosis, severe agitation - evaluation and management  Linda Repress, P 01/20/2024, 4:44 AM

## 2024-01-21 ENCOUNTER — Inpatient Hospital Stay (HOSPITAL_COMMUNITY)

## 2024-01-21 DIAGNOSIS — F10931 Alcohol use, unspecified with withdrawal delirium: Secondary | ICD-10-CM | POA: Diagnosis not present

## 2024-01-21 DIAGNOSIS — G9341 Metabolic encephalopathy: Secondary | ICD-10-CM | POA: Diagnosis not present

## 2024-01-21 DIAGNOSIS — J9601 Acute respiratory failure with hypoxia: Secondary | ICD-10-CM | POA: Diagnosis not present

## 2024-01-21 DIAGNOSIS — R509 Fever, unspecified: Secondary | ICD-10-CM | POA: Diagnosis not present

## 2024-01-21 LAB — CBC WITH DIFFERENTIAL/PLATELET
Abs Immature Granulocytes: 0.04 10*3/uL (ref 0.00–0.07)
Basophils Absolute: 0.1 10*3/uL (ref 0.0–0.1)
Basophils Relative: 1 %
Eosinophils Absolute: 0.6 10*3/uL — ABNORMAL HIGH (ref 0.0–0.5)
Eosinophils Relative: 8 %
HCT: 33.6 % — ABNORMAL LOW (ref 39.0–52.0)
Hemoglobin: 10.7 g/dL — ABNORMAL LOW (ref 13.0–17.0)
Immature Granulocytes: 1 %
Lymphocytes Relative: 20 %
Lymphs Abs: 1.5 10*3/uL (ref 0.7–4.0)
MCH: 29.8 pg (ref 26.0–34.0)
MCHC: 31.8 g/dL (ref 30.0–36.0)
MCV: 93.6 fL (ref 80.0–100.0)
Monocytes Absolute: 0.6 10*3/uL (ref 0.1–1.0)
Monocytes Relative: 9 %
Neutro Abs: 4.5 10*3/uL (ref 1.7–7.7)
Neutrophils Relative %: 61 %
Platelets: 146 10*3/uL — ABNORMAL LOW (ref 150–400)
RBC: 3.59 MIL/uL — ABNORMAL LOW (ref 4.22–5.81)
RDW: 13.3 % (ref 11.5–15.5)
WBC: 7.2 10*3/uL (ref 4.0–10.5)
nRBC: 0 % (ref 0.0–0.2)

## 2024-01-21 LAB — BASIC METABOLIC PANEL WITH GFR
Anion gap: 8 (ref 5–15)
BUN: 14 mg/dL (ref 6–20)
CO2: 25 mmol/L (ref 22–32)
Calcium: 8.5 mg/dL — ABNORMAL LOW (ref 8.9–10.3)
Chloride: 106 mmol/L (ref 98–111)
Creatinine, Ser: 0.98 mg/dL (ref 0.61–1.24)
GFR, Estimated: >60 mL/min (ref >60)
Glucose, Bld: 132 mg/dL — ABNORMAL HIGH (ref 70–99)
Potassium: 3.6 mmol/L (ref 3.5–5.1)
Sodium: 139 mmol/L (ref 135–145)

## 2024-01-21 LAB — GLUCOSE, CAPILLARY
Glucose-Capillary: 130 mg/dL — ABNORMAL HIGH (ref 70–99)
Glucose-Capillary: 142 mg/dL — ABNORMAL HIGH (ref 70–99)
Glucose-Capillary: 109 mg/dL — ABNORMAL HIGH (ref 70–99)
Glucose-Capillary: 113 mg/dL — ABNORMAL HIGH (ref 70–99)
Glucose-Capillary: 117 mg/dL — ABNORMAL HIGH (ref 70–99)

## 2024-01-21 LAB — AMMONIA: Ammonia: 73 umol/L — ABNORMAL HIGH (ref 9–35)

## 2024-01-21 LAB — MAGNESIUM: Magnesium: 2.3 mg/dL (ref 1.7–2.4)

## 2024-01-21 LAB — PHOSPHORUS: Phosphorus: 3.5 mg/dL (ref 2.5–4.6)

## 2024-01-21 MED ORDER — MIDAZOLAM HCL 2 MG/2ML IJ SOLN
INTRAMUSCULAR | Status: AC
  Filled 2024-01-21: qty 4

## 2024-01-21 MED ORDER — MIDAZOLAM HCL 2 MG/2ML IJ SOLN
2.0000 mg | INTRAMUSCULAR | Status: DC | PRN
  Administered 2024-01-21 – 2024-01-22 (×4): 4 mg via INTRAVENOUS
  Administered 2024-01-22: 2 mg via INTRAVENOUS
  Administered 2024-01-22 (×3): 4 mg via INTRAVENOUS
  Administered 2024-01-22 – 2024-01-23 (×3): 2 mg via INTRAVENOUS
  Administered 2024-01-23 – 2024-01-24 (×5): 4 mg via INTRAVENOUS
  Filled 2024-01-21 (×3): qty 4

## 2024-01-21 MED ORDER — VITAL 1.5 CAL PO LIQD
1000.0000 mL | ORAL | Status: DC
  Administered 2024-01-22: 1000 mL
  Filled 2024-01-21 (×2): qty 1000

## 2024-01-21 MED ORDER — MIDAZOLAM-SODIUM CHLORIDE 100-0.9 MG/100ML-% IV SOLN
0.5000 mg/h | INTRAVENOUS | Status: DC
Start: 2024-01-21 — End: 2024-01-24
  Administered 2024-01-21: 0.5 mg/h via INTRAVENOUS
  Administered 2024-01-22: 7.5 mg/h via INTRAVENOUS
  Administered 2024-01-22: 10 mg/h via INTRAVENOUS
  Administered 2024-01-23: 9.5 mg/h via INTRAVENOUS
  Administered 2024-01-23: 9 mg/h via INTRAVENOUS
  Administered 2024-01-23: 10 mg/h via INTRAVENOUS
  Filled 2024-01-21 (×6): qty 100

## 2024-01-21 MED ORDER — LACTULOSE 10 GM/15ML PO SOLN
30.0000 g | Freq: Two times a day (BID) | ORAL | Status: DC
  Administered 2024-01-21 – 2024-01-23 (×5): 30 g
  Filled 2024-01-21 (×5): qty 45

## 2024-01-21 MED ORDER — MIDAZOLAM HCL 2 MG/2ML IJ SOLN
5.0000 mg | Freq: Once | INTRAMUSCULAR | Status: AC
  Administered 2024-01-21: 5 mg via INTRAVENOUS
  Filled 2024-01-21: qty 6

## 2024-01-21 MED ORDER — LORAZEPAM 1 MG PO TABS
2.0000 mg | ORAL_TABLET | Freq: Three times a day (TID) | ORAL | Status: DC
  Administered 2024-01-21 – 2024-01-22 (×6): 2 mg
  Filled 2024-01-21 (×6): qty 2

## 2024-01-21 MED ORDER — FAMOTIDINE 40 MG/5ML PO SUSR
20.0000 mg | Freq: Two times a day (BID) | ORAL | Status: DC
Start: 2024-01-21 — End: 2024-01-27
  Administered 2024-01-21 – 2024-01-27 (×14): 20 mg
  Filled 2024-01-21 (×14): qty 2.5

## 2024-01-21 MED ORDER — VITAL HIGH PROTEIN PO LIQD
1000.0000 mL | ORAL | Status: AC
  Administered 2024-01-21: 1000 mL

## 2024-01-21 MED ORDER — QUETIAPINE FUMARATE 100 MG PO TABS
100.0000 mg | ORAL_TABLET | Freq: Two times a day (BID) | ORAL | Status: DC
  Administered 2024-01-21 – 2024-01-22 (×2): 100 mg
  Filled 2024-01-21 (×2): qty 1

## 2024-01-21 MED ORDER — POTASSIUM CHLORIDE 20 MEQ PO PACK
40.0000 meq | PACK | Freq: Once | ORAL | Status: AC
  Administered 2024-01-21: 40 meq
  Filled 2024-01-21: qty 2

## 2024-01-21 NOTE — Progress Notes (Signed)
 NAME:  Juan Clarke, MRN:  [id number], DOB:  September 27, 1980, LOS: 3 ADMISSION DATE:  01/17/2024, CONSULTATION DATE:  01/18/2024 REFERRING MD:  Eldon Greenland, MD, CHIEF COMPLAINT:  ETOH withdrawal   History of Present Illness:  43 y/o male with  Alcoholism, who presented to ED via EMS after he was found passed out on the cough.  He was able to eventually arouse but very confused and not able to effectively communicate.  In the ED patient very diaphoretic and confused.  However, he became increasingly more agitated.  He has received multiples doses of Ativan  and received 200mg  IV Phenobarbital .  However, he had to be placed on 4 point restraints.  His CIWA score was above 21.   ED labs revealed an increased LA 4.1, Ammonia level 125.   Pertinent  Medical History    Alcoholism F10.20 ; Severe major depression F32.2 ; Trigeminal neuralgia G50.0 ; Acquired hypothyroidism E03.9 ; Mixed hyperlipidemia E78.2 ; Essential (primary) hypertension I10 ; Idiopathic chronic gout without tophus,     Significant Hospital Events: Including procedures, antibiotic start and stop dates in addition to other pertinent events   5/21 Intubated and admitted to ICU 5/22 Worsening agitation overnight with increasing sedation requirements >>propofol dc'd due to high TGS,  ketamine added 5/22 Vomiting around ETT, aspiration   5/23 high-grade fever 104, ketamine titrated to 2 mg due to severe agitation  Interim History / Subjective:    Critically ill -remains on Precedex , ketamine. Versed  tapered to off and fentanyl  lowered to 250 mics Defervesced , good urine output   Objective    Blood pressure 115/71, pulse 65, temperature (!) 96.8 F (36 C), resp. rate 20, height 5\' 10"  (1.778 m), weight 130 kg, SpO2 100%.    Vent Mode: PRVC FiO2 (%):  [30 %-35 %] 35 % Set Rate:  [20 bmp] 20 bmp Vt Set:  [580 mL] 580 mL PEEP:  [5 cmH20] 5 cmH20 Plateau Pressure:  [18 cmH20-22 cmH20] 21 cmH20   Intake/Output Summary  (Last 24 hours) at 01/21/2024 0751 Last data filed at 01/21/2024 5621 Gross per 24 hour  Intake 3202.4 ml  Output 3690 ml  Net -487.6 ml   Filed Weights   01/19/24 0346 01/20/24 0117 01/21/24 0500  Weight: 129 kg 130 kg 130 kg    Examination: General: acute on chronic adult male , lying in icu bed on vent-no acute distress HEENT: Normocephalic, PERRLA intact, ETT, core track in place , OG with bilious aspirate CV: s1,s2, RRR, no MRG, No JVD  pulm: Decreased breath sounds bilateral, no accessory muscle use Abs: Obese bs active, soft, nontender Extremities: Nonpitting edema, no deformity, moves to painful stimuli Skin: no rash  Neuro: Rass -2 does not follow commands GU: Condom cath, bloody urine-from traumatic removal  X-ray abdomen shows nonspecific bowel gas pattern. Labs show mild hypokalemia, no leukocytosis, triglycerides decreased to 1300 , ammonia decreased to 73  Resolved problem list   Assessment and Plan   Acute Metabolic Encephalopathy  Acute ETOH withdrawal, Delirium Tremens  Hyperammonemia Intubated for airway protection   P:  Continue Precedex  and phenobarb taper Continue ketamine and minimize fentanyl  Added enteral Ativan  scheduled and IV per CIWA protocol Goal RASS 0 to -1 Continue thiamine , folic acid , multivitamin Increased lactulose  to 20 twice daily    Acute Hypoxic Respiratory Failure secondary to above  Aspiration pneumonia, witnessed aspiration 5/22 P:  Head of bed greater than 30 degrees Aspiration precautions Start spontaneous breathing trials once agitation controlled  High-grade fever Lactic acidosis, resolved - sepsis from aspiration pneumonia/UTI vs withdrawal   P: Continue Unasyn for aspiration coverage, respiratory culture negative so far  Hypertriglyceridemia -worsened by propofol, now DC'd, lipase normal Track TG's intermittently  Ileus -improving, likely related to high-dose opiates and immobilization Minimize  fentanyl  Resume tube feeds at trickle On lactulose  and Reglan   HTN  HLD P:  Holding antihypertensives and Lipitor  Hematuria - Traumatic Foley removal during agitation episode by patient  P: Continue condom catheter for now Bladder scan every 6 hours, reinsert if retention   Mom updated in detail 5/23  Best Practice (right click and "Reselect all SmartList Selections" daily)   Diet/type: tubefeeds- npo for now  DVT prophylaxis prophylactic heparin   Pressure ulcer(s): N/A GI prophylaxis: H2B Lines: N/A Foley:  Yes, and it is still needed Code Status:  full code     The patient is critically ill with multiple organ system failure and requires high complexity decision making for assessment and support, frequent evaluation and titration of therapies, advanced monitoring, review of radiographic studies and interpretation of complex data.   Critical Care Time devoted to patient care services, exclusive of separately billable procedures, described in this note is 34 minutes.   Jennifer Moellers Villa Greaser MD  01/21/2024, 7:51 AM  Please see [email or web address] for pager details.  From 7A-7P if no response, please call [phone]. After hours, please call ELink [phone].

## 2024-01-21 NOTE — Progress Notes (Signed)
 Nutrition Brief Note  Consult received for trickle tube feeding initiation.  CCM placed order for Vital High Protein at 61ml/hr. RN has already initiated.  Pt now receiving lactulose  BID.  Flexiseal in place with output documented over the last 24 hours.   Secure chat sent to CCM MD.  Pt continues with some ileus.  Amenable to TF titration if tolerating well and stool output continues.   TF recommendations per RD assessment on 5/23. Vital 1.5 with goal rate of 65 ml/hr.  *Recommend restarting at 20 mL/hr and advanccing by 10 mL Q12H + 60 ml Prosource TF 20 daily Provides 2420 kcals, 125g protein and 1191 ml H2O   Monitor magnesium , potassium, and phosphorus for at least 3 days, MD to replete as needed, as pt is at risk for refeeding syndrome.   Will continue to monitor and adjust nutrition plan of care as appropriate.   Juan Clarke, RDN, LDN Clinical Nutrition See AMiON for contact information.

## 2024-01-22 ENCOUNTER — Inpatient Hospital Stay (HOSPITAL_COMMUNITY)

## 2024-01-22 ENCOUNTER — Other Ambulatory Visit: Payer: Self-pay

## 2024-01-22 DIAGNOSIS — R509 Fever, unspecified: Secondary | ICD-10-CM | POA: Diagnosis not present

## 2024-01-22 DIAGNOSIS — F10931 Alcohol use, unspecified with withdrawal delirium: Secondary | ICD-10-CM | POA: Diagnosis not present

## 2024-01-22 DIAGNOSIS — J9601 Acute respiratory failure with hypoxia: Secondary | ICD-10-CM | POA: Diagnosis not present

## 2024-01-22 DIAGNOSIS — G9341 Metabolic encephalopathy: Secondary | ICD-10-CM | POA: Diagnosis not present

## 2024-01-22 LAB — CBC WITH DIFFERENTIAL/PLATELET
Abs Immature Granulocytes: 0.04 10*3/uL (ref 0.00–0.07)
Basophils Absolute: 0 10*3/uL (ref 0.0–0.1)
Basophils Relative: 1 %
Eosinophils Absolute: 0.6 10*3/uL — ABNORMAL HIGH (ref 0.0–0.5)
Eosinophils Relative: 9 %
HCT: 32.8 % — ABNORMAL LOW (ref 39.0–52.0)
Hemoglobin: 10.4 g/dL — ABNORMAL LOW (ref 13.0–17.0)
Immature Granulocytes: 1 %
Lymphocytes Relative: 21 %
Lymphs Abs: 1.3 10*3/uL (ref 0.7–4.0)
MCH: 30.9 pg (ref 26.0–34.0)
MCHC: 31.7 g/dL (ref 30.0–36.0)
MCV: 97.3 fL (ref 80.0–100.0)
Monocytes Absolute: 0.7 10*3/uL (ref 0.1–1.0)
Monocytes Relative: 11 %
Neutro Abs: 3.6 10*3/uL (ref 1.7–7.7)
Neutrophils Relative %: 57 %
Platelets: 149 10*3/uL — ABNORMAL LOW (ref 150–400)
RBC: 3.37 MIL/uL — ABNORMAL LOW (ref 4.22–5.81)
RDW: 13.4 % (ref 11.5–15.5)
WBC: 6.3 10*3/uL (ref 4.0–10.5)
nRBC: 0 % (ref 0.0–0.2)

## 2024-01-22 LAB — BASIC METABOLIC PANEL WITH GFR
Anion gap: 9 (ref 5–15)
BUN: 13 mg/dL (ref 6–20)
CO2: 28 mmol/L (ref 22–32)
Calcium: 8.6 mg/dL — ABNORMAL LOW (ref 8.9–10.3)
Chloride: 106 mmol/L (ref 98–111)
Creatinine, Ser: 0.79 mg/dL (ref 0.61–1.24)
GFR, Estimated: >60 mL/min (ref >60)
Glucose, Bld: 97 mg/dL (ref 70–99)
Potassium: 3.6 mmol/L (ref 3.5–5.1)
Sodium: 143 mmol/L (ref 135–145)

## 2024-01-22 LAB — GLUCOSE, CAPILLARY
Glucose-Capillary: 102 mg/dL — ABNORMAL HIGH (ref 70–99)
Glucose-Capillary: 103 mg/dL — ABNORMAL HIGH (ref 70–99)
Glucose-Capillary: 111 mg/dL — ABNORMAL HIGH (ref 70–99)
Glucose-Capillary: 114 mg/dL — ABNORMAL HIGH (ref 70–99)
Glucose-Capillary: 127 mg/dL — ABNORMAL HIGH (ref 70–99)
Glucose-Capillary: 67 mg/dL — ABNORMAL LOW (ref 70–99)
Glucose-Capillary: 77 mg/dL (ref 70–99)
Glucose-Capillary: 85 mg/dL (ref 70–99)

## 2024-01-22 LAB — MAGNESIUM: Magnesium: 2.3 mg/dL (ref 1.7–2.4)

## 2024-01-22 LAB — CULTURE, RESPIRATORY W GRAM STAIN: Culture: NO GROWTH

## 2024-01-22 LAB — PHOSPHORUS: Phosphorus: 3.7 mg/dL (ref 2.5–4.6)

## 2024-01-22 LAB — TRIGLYCERIDES: Triglycerides: 556 mg/dL — ABNORMAL HIGH (ref <150)

## 2024-01-22 MED ORDER — VITAL 1.5 CAL PO LIQD
1000.0000 mL | ORAL | Status: DC
  Administered 2024-01-22 – 2024-01-25 (×3): 1000 mL
  Filled 2024-01-22 (×2): qty 1000

## 2024-01-22 MED ORDER — KETAMINE HCL 100 MG/100ML IV SOLN: 2.5000 mg/kg/h | INTRAVENOUS | Status: DC

## 2024-01-22 MED ORDER — ALBUTEROL SULFATE (2.5 MG/3ML) 0.083% IN NEBU
2.5000 mg | INHALATION_SOLUTION | RESPIRATORY_TRACT | Status: DC | PRN
  Administered 2024-01-22 (×2): 2.5 mg via RESPIRATORY_TRACT
  Filled 2024-01-22 (×2): qty 3

## 2024-01-22 MED ORDER — ACETAMINOPHEN 10 MG/ML IV SOLN: 1000.0000 mg | Freq: Once | INTRAVENOUS | Status: AC

## 2024-01-22 MED ORDER — KETAMINE HCL 10 MG/ML IJ SOLN
1.0000 mg/kg/h | Status: DC
  Administered 2024-01-22 – 2024-01-23 (×7): 3 mg/kg/h via INTRAVENOUS
  Administered 2024-01-23 (×2): 1 mg/kg/h via INTRAVENOUS
  Administered 2024-01-23 (×3): 3 mg/kg/h via INTRAVENOUS
  Administered 2024-01-24: 1 mg/kg/h via INTRAVENOUS
  Filled 2024-01-22 (×20): qty 100

## 2024-01-22 MED ORDER — KETAMINE HCL 10 MG/ML IJ SOLN
2.5000 mg/kg/h | INTRAMUSCULAR | Status: DC
Start: 2024-01-22 — End: 2024-01-22
  Filled 2024-01-22 (×2): qty 100

## 2024-01-22 MED ORDER — GUAIFENESIN 200 MG PO TABS
400.0000 mg | ORAL_TABLET | Freq: Two times a day (BID) | ORAL | Status: AC
  Administered 2024-01-22 – 2024-01-26 (×10): 400 mg
  Filled 2024-01-22 (×10): qty 2

## 2024-01-22 MED ORDER — VECURONIUM BROMIDE 10 MG IV SOLR
10.0000 mg | Freq: Once | INTRAVENOUS | Status: AC
  Administered 2024-01-22: 10 mg via INTRAVENOUS
  Filled 2024-01-22: qty 10

## 2024-01-22 MED ORDER — DEXMEDETOMIDINE HCL IN NACL 400 MCG/100ML IV SOLN
0.0000 ug/kg/h | INTRAVENOUS | Status: AC
  Administered 2024-01-22 – 2024-01-23 (×10): 1.2 ug/kg/h via INTRAVENOUS
  Filled 2024-01-22 (×9): qty 100

## 2024-01-22 MED ORDER — STERILE WATER FOR INJECTION IJ SOLN
INTRAMUSCULAR | Status: AC
  Filled 2024-01-22: qty 10

## 2024-01-22 MED ORDER — QUETIAPINE FUMARATE 100 MG PO TABS
200.0000 mg | ORAL_TABLET | Freq: Two times a day (BID) | ORAL | Status: DC
  Administered 2024-01-22 – 2024-01-23 (×2): 200 mg
  Filled 2024-01-22 (×2): qty 2

## 2024-01-22 MED ORDER — QUETIAPINE FUMARATE 100 MG PO TABS
100.0000 mg | ORAL_TABLET | Freq: Once | ORAL | Status: AC
  Administered 2024-01-22: 100 mg via ORAL
  Filled 2024-01-22: qty 1

## 2024-01-22 MED ORDER — DEXTROSE 50 % IV SOLN
INTRAVENOUS | Status: AC
Start: 2024-01-22 — End: 2024-01-22
  Administered 2024-01-22: 12.5 g via INTRAVENOUS
  Filled 2024-01-22: qty 50

## 2024-01-22 MED ORDER — POTASSIUM CHLORIDE 20 MEQ PO PACK
40.0000 meq | PACK | Freq: Once | ORAL | Status: AC
  Administered 2024-01-22: 40 meq
  Filled 2024-01-22: qty 2

## 2024-01-22 MED ORDER — SODIUM CHLORIDE 3 % IN NEBU
4.0000 mL | INHALATION_SOLUTION | Freq: Two times a day (BID) | RESPIRATORY_TRACT | Status: AC
Start: 2024-01-22 — End: 2024-01-25
  Administered 2024-01-22 – 2024-01-24 (×6): 4 mL via RESPIRATORY_TRACT
  Filled 2024-01-22 (×6): qty 4

## 2024-01-22 MED ORDER — DEXMEDETOMIDINE HCL IN NACL 200 MCG/50ML IV SOLN: 0.0000 ug/kg/h | INTRAVENOUS | Status: DC

## 2024-01-22 MED ORDER — DEXTROSE 50 % IV SOLN
12.5000 g | INTRAVENOUS | Status: AC
Start: 2024-01-22 — End: 2024-01-22

## 2024-01-22 NOTE — Progress Notes (Signed)
 RUE with multiple PIV's present in upper arm veins, including brachial/basilic. Assessed with US  for PICC placement. RUE red and swollen with PIV's in place, unable to assess fully for PICC, but some echogenicity noted and difficult to compress.  LUE red and swollen, at the level of induration in upper arm, noted echogenicity present and non compressible  basilic vein.   Brachial vein too small, with PICC occupancy 66%.  No cephalic vein noted in either upper arm.  PIV's noted to be pink and moisture present under dressings.  Dr Villa Greaser notified of assessment above.  Recommended CVC placement and BUE vascular ultrasound.

## 2024-01-22 NOTE — Plan of Care (Signed)
  Problem: Fluid Volume: Goal: Ability to maintain a balanced intake and output will improve Outcome: Progressing   Problem: Metabolic: Goal: Ability to maintain appropriate glucose levels will improve Outcome: Progressing   Problem: Skin Integrity: Goal: Risk for impaired skin integrity will decrease Outcome: Progressing   Problem: Tissue Perfusion: Goal: Adequacy of tissue perfusion will improve Outcome: Progressing   Problem: Clinical Measurements: Goal: Ability to maintain clinical measurements within normal limits will improve Outcome: Progressing Goal: Will remain free from infection Outcome: Progressing Goal: Diagnostic test results will improve Outcome: Progressing Goal: Respiratory complications will improve Outcome: Progressing Goal: Cardiovascular complication will be avoided Outcome: Progressing   Problem: Nutritional: Goal: Maintenance of adequate nutrition will improve Outcome: Not Progressing Goal: Progress toward achieving an optimal weight will improve Outcome: Not Progressing

## 2024-01-22 NOTE — Progress Notes (Signed)
 Per patient's wife, Emmitte Surgeon, okay for patient's mother, [name] Conrad, to sign consent for PICC line placement.

## 2024-01-22 NOTE — Progress Notes (Signed)
 During bedside report, moderate cuff leak noted; approx difference between inspiratory and expiratory volumes. 1 mL of air added to cuff with positive effect. About 5 minutes later, tidal volumes decreased to less than 200. Respiratory notified. Patient suctioned with no improvement. Patient began to desat to 45s, see flowsheet for documentation. Began bagging patient. Initially difficult to bag, however resistance decreased after a few breaths were delivered. O2 saturations improved with bagging; patient suctioned and lavaged by respiratory therapy.  Chest x-ray, 3% saline nebs, guaifenesin, and chest PT ordered by MD due to suspected mucous plug.

## 2024-01-22 NOTE — Progress Notes (Signed)
 NAME:  Juan Clarke, MRN:  [id number], DOB:  Nov 11, 1980, LOS: 4 ADMISSION DATE:  01/17/2024, CONSULTATION DATE:  01/18/2024 REFERRING MD:  Eldon Greenland, MD, CHIEF COMPLAINT:  ETOH withdrawal   History of Present Illness:  43 y/o male with  Alcoholism, who presented to ED via EMS after he was found passed out on the cough.  He was able to eventually arouse but very confused and not able to effectively communicate.  In the ED patient very diaphoretic and confused.  However, he became increasingly more agitated.  He has received multiples doses of Ativan  and received 200mg  IV Phenobarbital .  However, he had to be placed on 4 point restraints.  His CIWA score was above 21.   ED labs revealed an increased LA 4.1, Ammonia level 125.   Pertinent  Medical History    Alcoholism F10.20 ; Severe major depression F32.2 ; Trigeminal neuralgia G50.0 ; Acquired hypothyroidism E03.9 ; Mixed hyperlipidemia E78.2 ; Essential (primary) hypertension I10 ; Idiopathic chronic gout without tophus,     Significant Hospital Events: Including procedures, antibiotic start and stop dates in addition to other pertinent events   5/21 Intubated and admitted to ICU 5/22 Worsening agitation overnight with increasing sedation requirements >>propofol dc'd due to high TGS,  ketamine added 5/22 Vomiting around ETT, aspiration   5/23 high-grade fever 104, ketamine titrated to 2 mg due to severe agitation 5/24 breakthrough sedation, resume Versed  drip, added Seroquel, enteral Ativan    Interim History / Subjective:   Remains critically ill -on ketamine, Versed , fentanyl  and Precedex  drips Intermittent breakthrough agitation Febrile 101, now defervesced again Episode of desaturation with mucous plugs, improved with bagging and suctioning Tube feeds again held   Objective    Blood pressure 115/73, pulse 79, temperature 97.7 F (36.5 C), resp. rate (!) 23, height 5\' 10"  (1.778 m), weight 128.9 kg, SpO2 100%.     Vent Mode: PRVC FiO2 (%):  [30 %-35 %] 35 % Set Rate:  [20 bmp] 20 bmp Vt Set:  [580 mL] 580 mL PEEP:  [5 cmH20] 5 cmH20 Plateau Pressure:  [17 cmH20-28 cmH20] 28 cmH20   Intake/Output Summary (Last 24 hours) at 01/22/2024 0813 Last data filed at 01/22/2024 0530 Gross per 24 hour  Intake 3078.32 ml  Output 3775 ml  Net -696.68 ml   Filed Weights   01/20/24 0117 01/21/24 0500 01/22/24 0500  Weight: 130 kg 130 kg 128.9 kg    Examination: General: acute on chronic adult male , lying in icu bed on vent-no acute distress HEENT: Normocephalic, PERRLA intact, ETT, core track in place , OG with bilious aspirate CV: s1,s2, RRR, no MRG, No JVD  pulm: No accessory muscle use, decreased breath sounds bilateral Abs: Obese bs active, soft, nontender Extremities: Nonpitting edema, no deformity, moves to painful stimuli Skin: no rash  Neuro: Rass -2 with intermittent agitation, does not follow commands , no brisk reflexes, no myoclonus GU: Liquid stool in Flexi-Seal  X-ray abdomen shows nonspecific bowel gas pattern, confirms postpyloric position of feeding tube Chest x-ray shows improved infiltrates, tubes in position Labs show mild hypokalemia, no leukocytosis, triglycerides decreased to 556  Resolved problem list   Assessment and Plan   Acute Metabolic Encephalopathy  Acute ETOH withdrawal, Delirium Tremens  -mother reports he has been sober for 3 months with relapse , have to consider serotonin syndrome although no myoclonus Hyperammonemia Intubated for airway protection   P:  Continue Precedex  and phenobarb taper Continue ketamine and minimize fentanyl  -avoid serotonergic  meds Increase enteral Ativan  scheduled to 2 mg 3 times daily and IV per CIWA protocol Added Seroquel 100 twice daily Goal RASS 0 to -1 Continue thiamine , folic acid , multivitamin Increased lactulose  to 30 twice daily, repeat ammonia level tomorrow    Acute Hypoxic Respiratory Failure secondary to above   Aspiration pneumonia, witnessed aspiration 5/22 Episode of mucous plugging 5/25 P:  Head of bed greater than 30 degrees Aspiration precautions Add hypertonic saline nebs, Mucinex, chest PT and albuterol nebs with tracheobronchial toilet Start spontaneous breathing trials once agitation controlled    High-grade fever Lactic acidosis, resolved - sepsis from aspiration pneumonia/UTI vs withdrawal   P: Continue Unasyn for aspiration coverage, respiratory culture negative so far  Hypertriglyceridemia -worsened by propofol,  lipase normal Track TG's intermittently -improved  Ileus -improving, likely related to high-dose opiates and immobilization Intermittent aspiration Reported by RN -2 such episodes now when tube feeds restarted, confirmed panda is postpyloric Minimize fentanyl  Tube feeds on hold currently, resume D10 at 30 Completed Reglan   HTN  HLD P:  Holding antihypertensives and Lipitor  Hematuria - Traumatic Foley removal during agitation episode by patient  P: Continue condom catheter for now Bladder scan every 6 hours, reinsert if retention   Mom updated in detail 5/24  Best Practice (right click and "Reselect all SmartList Selections" daily)   Diet/type: tubefeeds- npo for now  DVT prophylaxis prophylactic heparin   Pressure ulcer(s): N/A GI prophylaxis: H2B Lines: N/A Foley:  Yes, and it is still needed Code Status:  full code     The patient is critically ill with multiple organ system failure and requires high complexity decision making for assessment and support, frequent evaluation and titration of therapies, advanced monitoring, review of radiographic studies and interpretation of complex data.   Critical Care Time devoted to patient care services, exclusive of separately billable procedures, described in this note is 35 minutes.   Jennifer Moellers Villa Greaser MD  01/22/2024, 8:13 AM  Please see [email or web address] for pager details.  From 7A-7P if no response,  please call [phone]. After hours, please call ELink [phone].

## 2024-01-22 NOTE — Progress Notes (Addendum)
 eLink Physician-Brief Progress Note Patient Name: Juan Clarke DOB: [birth year] MRN: [id number]   Date of Service  01/22/2024  HPI/Events of Note  43 y/o male with Alcoholism, who presented to ED via EMS after he was found passed out on the cough.   eICU Interventions  Renew Precedex    0140 - 1x Tylenol  for Fever  Intervention Category Minor Interventions: Routine modifications to care plan (e.g. PRN medications for pain, fever)  Alizabeth Antonio 01/22/2024, 1:02 AM

## 2024-01-22 NOTE — Procedures (Signed)
 Central Venous Catheter Insertion Procedure Note  Juan Clarke  [id number]  21-Oct-1980  Date:01/22/24  Time:5:25 PM   Provider Performing:Orpha Dain V. Villa Greaser   Procedure: Insertion of Non-tunneled Central Venous [phone]) with US  guidance ([id number])   Indication(s) Medication administration and Difficult access  Consent Risks of the procedure as well as the alternatives and risks of each were explained to the patient and/or caregiver.  Consent for the procedure was obtained and is signed in the bedside chart  Anesthesia Topical only with 1% lidocaine    Timeout Verified patient identification, verified procedure, site/side was marked, verified correct patient position, special equipment/implants available, medications/allergies/relevant history reviewed, required imaging and test results available.  Sterile Technique Maximal sterile technique including full sterile barrier drape, hand hygiene, sterile gown, sterile gloves, mask, hair covering, sterile ultrasound probe cover (if used).  Procedure Description Area of catheter insertion was cleaned with chlorhexidine  and draped in sterile fashion.  With real-time ultrasound guidance a central venous catheter was placed into the left internal jugular vein. Nonpulsatile blood flow and easy flushing noted in all ports.  The catheter was sutured in place and sterile dressing applied.  Complications/Tolerance None; patient tolerated the procedure well. Chest X-ray is ordered to verify placement for internal jugular or subclavian cannulation.   Chest x-ray is not ordered for femoral cannulation.  EBL Minimal  Specimen(s) None  Joleene Burnham V. Villa Greaser MD

## 2024-01-22 NOTE — Progress Notes (Signed)
 Prior to placement of central line, Dr. Villa Greaser requested that ordered paralytic be administered due persistent myoclonic movements of upper body despite 3mg /kg ketamine, 10mg /hr versed  + 4mg  bolus, 1.2 mg precedex , and 275 mcg fentanyl . BIS at this time was reading 88, and had been reading greater than 70 since BIS monitoring was initiated. BIS waveform appeared appropriate. Due to persistent movement, extremely high levels of sedation, and minimal patient responsiveness, 10mg  vecuronium administered at 1649 with positive effect. BP prior to administration 148/84 w/ HR 111; BP following administration 154/80 w/ HR 106. Patient became dyssynchronous with the vent and resumed movement of extremities approx. 45 minutes after paralytic administration.

## 2024-01-22 NOTE — Progress Notes (Signed)
 Hypoglycemic Event  CBG: 67  Treatment: D50 25 mL (12.5 gm)  Symptoms: None  Follow-up CBG: Time: 0820 CBG Result: 127  Possible Reasons for Event: Other: tube feeds paused overnight  Comments/MD notified: Dr. Villa Greaser aware    Kenslee Achorn

## 2024-01-23 ENCOUNTER — Inpatient Hospital Stay (HOSPITAL_COMMUNITY)

## 2024-01-23 DIAGNOSIS — R609 Edema, unspecified: Secondary | ICD-10-CM | POA: Diagnosis not present

## 2024-01-23 DIAGNOSIS — G9341 Metabolic encephalopathy: Secondary | ICD-10-CM | POA: Diagnosis not present

## 2024-01-23 DIAGNOSIS — R509 Fever, unspecified: Secondary | ICD-10-CM | POA: Diagnosis not present

## 2024-01-23 DIAGNOSIS — F10931 Alcohol use, unspecified with withdrawal delirium: Secondary | ICD-10-CM | POA: Diagnosis not present

## 2024-01-23 DIAGNOSIS — J9601 Acute respiratory failure with hypoxia: Secondary | ICD-10-CM | POA: Diagnosis not present

## 2024-01-23 LAB — BASIC METABOLIC PANEL WITH GFR
Anion gap: 9 (ref 5–15)
BUN: 9 mg/dL (ref 6–20)
CO2: 27 mmol/L (ref 22–32)
Calcium: 8.3 mg/dL — ABNORMAL LOW (ref 8.9–10.3)
Chloride: 104 mmol/L (ref 98–111)
Creatinine, Ser: 0.98 mg/dL (ref 0.61–1.24)
GFR, Estimated: >60 mL/min (ref >60)
Glucose, Bld: 114 mg/dL — ABNORMAL HIGH (ref 70–99)
Potassium: 3.4 mmol/L — ABNORMAL LOW (ref 3.5–5.1)
Sodium: 140 mmol/L (ref 135–145)

## 2024-01-23 LAB — CBC WITH DIFFERENTIAL/PLATELET
Abs Immature Granulocytes: 0.05 10*3/uL (ref 0.00–0.07)
Basophils Absolute: 0 10*3/uL (ref 0.0–0.1)
Basophils Relative: 1 %
Eosinophils Absolute: 0.5 10*3/uL (ref 0.0–0.5)
Eosinophils Relative: 8 %
HCT: 30.7 % — ABNORMAL LOW (ref 39.0–52.0)
Hemoglobin: 10 g/dL — ABNORMAL LOW (ref 13.0–17.0)
Immature Granulocytes: 1 %
Lymphocytes Relative: 15 %
Lymphs Abs: 0.9 10*3/uL (ref 0.7–4.0)
MCH: 30.7 pg (ref 26.0–34.0)
MCHC: 32.6 g/dL (ref 30.0–36.0)
MCV: 94.2 fL (ref 80.0–100.0)
Monocytes Absolute: 0.6 10*3/uL (ref 0.1–1.0)
Monocytes Relative: 10 %
Neutro Abs: 4 10*3/uL (ref 1.7–7.7)
Neutrophils Relative %: 65 %
Platelets: 193 10*3/uL (ref 150–400)
RBC: 3.26 MIL/uL — ABNORMAL LOW (ref 4.22–5.81)
RDW: 13.6 % (ref 11.5–15.5)
WBC: 6.1 10*3/uL (ref 4.0–10.5)
nRBC: 0 % (ref 0.0–0.2)

## 2024-01-23 LAB — GLUCOSE, CAPILLARY
Glucose-Capillary: 109 mg/dL — ABNORMAL HIGH (ref 70–99)
Glucose-Capillary: 114 mg/dL — ABNORMAL HIGH (ref 70–99)
Glucose-Capillary: 127 mg/dL — ABNORMAL HIGH (ref 70–99)
Glucose-Capillary: 132 mg/dL — ABNORMAL HIGH (ref 70–99)
Glucose-Capillary: 72 mg/dL (ref 70–99)
Glucose-Capillary: 82 mg/dL (ref 70–99)

## 2024-01-23 LAB — URINALYSIS, ROUTINE W REFLEX MICROSCOPIC
Bacteria, UA: NONE SEEN
Bilirubin Urine: NEGATIVE
Glucose, UA: NEGATIVE mg/dL
Hgb urine dipstick: NEGATIVE
Ketones, ur: 5 mg/dL — AB
Leukocytes,Ua: NEGATIVE
Nitrite: NEGATIVE
Protein, ur: 30 mg/dL — AB
Specific Gravity, Urine: 1.032 — ABNORMAL HIGH (ref 1.005–1.030)
pH: 5 (ref 5.0–8.0)

## 2024-01-23 LAB — CULTURE, BLOOD (ROUTINE X 2)
Culture: NO GROWTH
Culture: NO GROWTH
Special Requests: ADEQUATE
Special Requests: ADEQUATE

## 2024-01-23 LAB — PHOSPHORUS: Phosphorus: 4.9 mg/dL — ABNORMAL HIGH (ref 2.5–4.6)

## 2024-01-23 LAB — MAGNESIUM: Magnesium: 2.5 mg/dL — ABNORMAL HIGH (ref 1.7–2.4)

## 2024-01-23 LAB — AMMONIA: Ammonia: 39 umol/L — ABNORMAL HIGH (ref 9–35)

## 2024-01-23 MED ORDER — LACTATED RINGERS IV BOLUS
500.0000 mL | Freq: Once | INTRAVENOUS | Status: AC
Start: 2024-01-23 — End: 2024-01-23
  Administered 2024-01-23: 500 mL via INTRAVENOUS

## 2024-01-23 MED ORDER — HYDROMORPHONE HCL-NACL 50-0.9 MG/50ML-% IV SOLN
2.0000 mg/h | INTRAVENOUS | Status: DC
  Administered 2024-01-23 (×2): 2 mg/h via INTRAVENOUS
  Filled 2024-01-23 (×3): qty 50

## 2024-01-23 MED ORDER — HYDROMORPHONE BOLUS VIA INFUSION
1.0000 mg | INTRAVENOUS | Status: DC | PRN
  Administered 2024-01-24: 1 mg via INTRAVENOUS

## 2024-01-23 MED ORDER — LORAZEPAM 1 MG PO TABS
2.0000 mg | ORAL_TABLET | Freq: Four times a day (QID) | ORAL | Status: DC
  Administered 2024-01-23 (×4): 2 mg
  Filled 2024-01-23 (×4): qty 2

## 2024-01-23 MED ORDER — QUETIAPINE FUMARATE 100 MG PO TABS
200.0000 mg | ORAL_TABLET | Freq: Three times a day (TID) | ORAL | Status: DC
  Administered 2024-01-23 – 2024-01-27 (×12): 200 mg
  Filled 2024-01-23 (×12): qty 2

## 2024-01-23 MED ORDER — LACTULOSE 10 GM/15ML PO SOLN
20.0000 g | Freq: Two times a day (BID) | ORAL | Status: DC
  Administered 2024-01-23 – 2024-01-27 (×9): 20 g
  Filled 2024-01-23 (×9): qty 30

## 2024-01-23 MED ORDER — FENTANYL CITRATE PF 50 MCG/ML IJ SOSY
25.0000 ug | PREFILLED_SYRINGE | INTRAMUSCULAR | Status: DC | PRN
  Administered 2024-01-23: 25 ug via INTRAVENOUS
  Administered 2024-01-23: 100 ug via INTRAVENOUS
  Filled 2024-01-23 (×2): qty 2

## 2024-01-23 MED ORDER — POTASSIUM CHLORIDE 20 MEQ PO PACK
40.0000 meq | PACK | Freq: Once | ORAL | Status: AC
  Filled 2024-01-23: qty 2

## 2024-01-23 MED ORDER — DEXMEDETOMIDINE HCL IN NACL 200 MCG/50ML IV SOLN
0.0000 ug/kg/h | INTRAVENOUS | Status: DC
Start: 2024-01-23 — End: 2024-01-23
  Filled 2024-01-23: qty 50

## 2024-01-23 MED ORDER — POTASSIUM CHLORIDE 20 MEQ PO PACK
40.0000 meq | PACK | Freq: Once | ORAL | Status: AC
  Administered 2024-01-23: 40 meq
  Filled 2024-01-23: qty 2

## 2024-01-23 MED ORDER — DEXMEDETOMIDINE HCL IN NACL 400 MCG/100ML IV SOLN
0.0000 ug/kg/h | INTRAVENOUS | Status: DC
  Administered 2024-01-23 (×2): 1.2 ug/kg/h via INTRAVENOUS
  Administered 2024-01-23: 0.6 ug/kg/h via INTRAVENOUS
  Administered 2024-01-23: 1 ug/kg/h via INTRAVENOUS
  Administered 2024-01-23 (×2): 1.2 ug/kg/h via INTRAVENOUS
  Administered 2024-01-24: 1 ug/kg/h via INTRAVENOUS
  Filled 2024-01-23 (×7): qty 100

## 2024-01-23 MED ORDER — HYDROMORPHONE HCL 1 MG/ML IJ SOLN
1.0000 mg | INTRAMUSCULAR | Status: DC | PRN
  Administered 2024-01-23 (×2): 2 mg via INTRAVENOUS

## 2024-01-23 NOTE — Progress Notes (Signed)
 Nutrition Follow-up  DOCUMENTATION CODES:      INTERVENTION:  - Per CCM, continue trickle tube feeds: Vital 1.5 @ 33mL/hr - provides 720 kcals, 32g protein, and free water   - Once able to advance past trickle tube feeds, recommend:  Vital 1.5 with goal rate of 65 ml/hr.  *Recommend advancing by 10 mL Q12H + 60 ml Prosource TF 20 daily Provides 2420 kcals, 125g protein and 1191 ml H2O   - Monitor magnesium , potassium, and phosphorus for at least 3 days, MD to replete as needed, as pt is at risk for refeeding syndrome.    - If unable to advance past trickle tube feeds in the next 48 hours, would recommend recommend consideration for TPN as patient has had minimal nutrition since admission 5 days ago.    NUTRITION DIAGNOSIS:   Increased nutrient needs related to chronic illness as evidenced by estimated needs. *ongoing  GOAL:   Patient will meet greater than or equal to 90% of their needs *progressing, on trickle TF's  MONITOR:   Labs, TF tolerance, Vent status  REASON FOR ASSESSMENT:   Consult Enteral/tube feeding initiation and management (trickle TF)  ASSESSMENT:   43 y.o. male history of EtOH abuse, hypertension, hepatic encephalopathy , admitted for altered mental status.  5/21 Admit; Intubated; Cortrak placed - TF initiated 5/22 TF held after patient vomited 5/24 Trickle tube feeds restarted 5/25 TF held in the AM due to vomiting; restarted in the afternoon  Patient is currently intubated on ventilator support MV: 11.8 L/min Temp (24hrs), Avg:99.4 F (37.4 C), Min:96.1 F (35.6 C), Max:102.2 F (39 C)  Patient remains on trickle tube feeds at this time. Per discussion with RN, CCM wanting to keep patient on trickles for now. No further vomiting as of now.    Patient currently receiving trickle tube feedst and also receiving D10 @ 45mL/hr. Together, he is currently receiving 965 kcals and 32g protein.  Per CCM notes, patient continues to have  agitation breakthroughs. SBTs to start once agitation controlled.   Admit weight: 265# Current weight: 287# I&O's: +11.5L since admit   Medications reviewed and include: 1mg  folic acid , 100mg  thiamine  daily, Lactulose  BID, D10@ 14mL/hr (provides 245 kcals over 24 hours) Precedex  Ketamine Versed    Labs reviewed:  K+ 3.4 Phosphorus 4.9 Magnesium  2.5   Diet Order:   Diet Order             Diet NPO time specified  Diet effective now                   EDUCATION NEEDS:  Not appropriate for education at this time  Skin:  Skin Assessment: Reviewed RN Assessment  Last BM:  5/26 - rectal tube  Height:  Ht Readings from Last 1 Encounters:  01/21/24 5\' 10"  (1.778 m)   Weight:  Wt Readings from Last 1 Encounters:  01/23/24 130.3 kg   Ideal Body Weight:  75.45 kg  BMI:  Body mass index is 41.22 kg/m.  Estimated Nutritional Needs:  Kcal:  2250-2450 Protein:  115-125g Fluid:  2L/day    Scheryl Cushing RD, LDN Contact via Secure Chat.

## 2024-01-23 NOTE — Progress Notes (Signed)
 Sputum sample obtained and sent to the lab at this time.

## 2024-01-23 NOTE — Progress Notes (Signed)
 Dilaudid  seems to work well. Will lower ketamine to 1 with plans to taper off by tomorrow. Duplex UE shows superficial vein thrombosis  likely related to IVs, no anticoagulation for now  Devonna Oboyle V. Villa Greaser MD

## 2024-01-23 NOTE — Progress Notes (Signed)
 eLink Physician-Brief Progress Note Patient Name: Juan Clarke DOB: [birth year] MRN: [id number]   Date of Service  01/23/2024  HPI/Events of Note  Received request to renew Precedex  order Seen adequately sedated on continued Precedex   eICU Interventions  Precedex  order renewed     Intervention Category Intermediate Interventions: Other:  [name] 01/23/2024, 4:05 AM

## 2024-01-23 NOTE — Progress Notes (Signed)
   01/23/24 2000  Chest Physiotherapy Tx  CPT Delivery Source Bed;Other (Comment) (held due to agitation)  Position Supine  Respiratory  Bilateral Breath Sounds Rhonchi;Diminished

## 2024-01-23 NOTE — Plan of Care (Signed)
  Problem: Fluid Volume: Goal: Ability to maintain a balanced intake and output will improve Outcome: Progressing   Problem: Metabolic: Goal: Ability to maintain appropriate glucose levels will improve Outcome: Progressing   Problem: Nutritional: Goal: Maintenance of adequate nutrition will improve Outcome: Progressing Goal: Progress toward achieving an optimal weight will improve Outcome: Progressing   Problem: Skin Integrity: Goal: Risk for impaired skin integrity will decrease Outcome: Progressing   Problem: Clinical Measurements: Goal: Will remain free from infection Outcome: Progressing Goal: Diagnostic test results will improve Outcome: Progressing Goal: Respiratory complications will improve Outcome: Progressing Goal: Cardiovascular complication will be avoided Outcome: Progressing

## 2024-01-23 NOTE — Progress Notes (Signed)
 eLink Physician-Brief Progress Note Patient Name: Juan Clarke DOB: [birth year] MRN: [id number]   Date of Service  01/23/2024  HPI/Events of Note  May you change PRN dilaudid  order to "bolus from bag"  Camera: Vent. Intermittent agitation. From alcohol related encephalopathy. VS stable.   eICU Interventions  Can use bolus from bag , ordered.      Intervention Category Intermediate Interventions: Other:;Respiratory distress - evaluation and management  Rexann Catalan 01/23/2024, 8:28 PM  05:48 Precedex  order is about to expire. May you please reorder.  BP soft 70's. Titrating down sedation   Re ordered. Wean it.   06:12 Camera evaluation done for Can you add BP parameters. BP 79/48. Weaning sedation. Pt does wake up on vent appropriately. 35% fio2. PIP 24. In synchrony. On 4 sedation drips. Not on propofol. Temperature broke. - LR bolus and star levophed. -float CVP via left internal jugular, if low call back for more fluids.   06:30 CVP at 15. Continue care, post LR bolus, no need for more fluids.

## 2024-01-23 NOTE — Progress Notes (Signed)
 BUE venous duplex exam has been completed.  Preliminary results given to Dr. Villa Greaser.   Results can be found under chart review under CV PROC. 01/23/2024 10:58 AM Laakea Pereira RVT, RDMS

## 2024-01-23 NOTE — Progress Notes (Signed)
 NAME:  Juan Clarke, MRN:  [id number], DOB:  10-03-1980, LOS: 5 ADMISSION DATE:  01/17/2024, CONSULTATION DATE:  01/18/2024 REFERRING MD:  Eldon Greenland, MD, CHIEF COMPLAINT:  ETOH withdrawal   History of Present Illness:  43 y/o male with  Alcoholism, who presented to ED via EMS after he was found passed out on the cough.  He was able to eventually arouse but very confused and not able to effectively communicate.  In the ED patient very diaphoretic and confused.  However, he became increasingly more agitated.  He has received multiples doses of Ativan  and received 200mg  IV Phenobarbital .  However, he had to be placed on 4 point restraints.  His CIWA score was above 21.   ED labs revealed an increased LA 4.1, Ammonia level 125.   Pertinent  Medical History    Alcoholism F10.20 ; Severe major depression F32.2 ; Trigeminal neuralgia G50.0 ; Acquired hypothyroidism E03.9 ; Mixed hyperlipidemia E78.2 ; Essential (primary) hypertension I10 ; Idiopathic chronic gout without tophus,     Significant Hospital Events: Including procedures, antibiotic start and stop dates in addition to other pertinent events   5/21 Intubated and admitted to ICU 5/22 Worsening agitation overnight with increasing sedation requirements >>propofol dc'd due to high TGS,  ketamine added 5/22 Vomiting around ETT, aspiration   5/23 high-grade fever 104, ketamine titrated to 2 mg due to severe agitation 5/24 breakthrough sedation, resume Versed  drip, added Seroquel, enteral Ativan   5/25 several pushes for agitation, finally given vecuronium x 1 , left IJ CVL placed 5/25 Episode of desaturation with mucous plugs, improved with bagging and suctioning  Interim History / Subjective:   Remains critically ill -on ketamine, Versed , fentanyl  and Precedex  drips Intermittent breakthrough agitation continues Febrile 101 yesterday, now back to low-grade 100.5 Tube feeds restarted   Objective    Blood pressure (!) 97/53,  pulse 88, temperature (!) 100.5 F (38.1 C), temperature source Rectal, resp. rate 18, height 5\' 10"  (1.778 m), weight 130.3 kg, SpO2 99%.    Vent Mode: PRVC FiO2 (%):  [35 %] 35 % Set Rate:  [20 bmp] 20 bmp Vt Set:  [580 mL] 580 mL PEEP:  [5 cmH20] 5 cmH20 Plateau Pressure:  [18 cmH20-22 cmH20] 22 cmH20   Intake/Output Summary (Last 24 hours) at 01/23/2024 0836 Last data filed at 01/23/2024 0815 Gross per 24 hour  Intake 4906.85 ml  Output 3950 ml  Net 956.85 ml   Filed Weights   01/21/24 0500 01/22/24 0500 01/23/24 0459  Weight: 130 kg 128.9 kg 130.3 kg    Examination: General: acute on chronic adult male , lying in icu bed on vent-no acute distress HEENT: Normocephalic, PERRLA intact, ETT, core track in place , OG with bilious aspirate CV: s1,s2, RRR, no MRG, No JVD  pulm: Decreased secretions, no accessory muscle use decreased breath sounds bilateral Abs: Obese bs active, soft, nontender Extremities: Nonpitting edema, no deformity, moves to painful stimuli Skin: no rash  Neuro: Rass -2 with intermittent agitation, eyes partly open but does not follow commands , no brisk reflexes, no myoclonus GU: Liquid stool in Flexi-Seal  X-ray abdomen shows nonspecific bowel gas pattern, confirms postpyloric position of feeding tube Chest x-ray shows improved infiltrates, tubes/left IJ in position Labs show mild hypokalemia, no leukocytosis, triglycerides decreased to 556  Resolved problem list   Assessment and Plan   Acute Metabolic Encephalopathy  Acute ETOH withdrawal, Delirium Tremens  -mother reports he has been sober for 3 months with relapse ,  have to consider serotonin syndrome although no myoclonus Hyperammonemia Intubated for airway protection   P:  Continue Precedex  and phenobarb taper Added Versed  drip and titrated to  10 mg/h Continue ketamine  and minimize fentanyl  -avoid serotonergic meds Increase enteral Ativan  scheduled to 2 mg 3 times daily and IV per CIWA  protocol Increase Seroquel  to 200 twice daily, QTc normal 454 Goal RASS 0 to -1 Continue thiamine , folic acid , multivitamin Increased lactulose  to 30 twice daily, repeat ammonia level    Acute Hypoxic Respiratory Failure secondary to above  Aspiration pneumonia, witnessed aspiration 5/22 Episode of mucous plugging 5/25 P:  Head of bed greater than 30 degrees Aspiration precautions Added hypertonic saline nebs, Mucinex , chest PT and albuterol  nebs with tracheobronchial toilet Start spontaneous breathing trials once agitation controlled    High-grade fever Lactic acidosis, resolved - sepsis from aspiration pneumonia/UTI vs withdrawal   P: Continue Unasyn  for aspiration coverage, respiratory and repeat blood culture negative so far  Hypertriglyceridemia -worsened by propofol ,  lipase normal Track TG's intermittently -improved  Ileus -improving, likely related to high-dose opiates and immobilization Intermittent aspiration Reported by RN -2 such episodes now when tube feeds restarted, confirmed panda is postpyloric Minimize fentanyl  Tube feeds restarted at trickle Completed Reglan    HTN  HLD P:  Holding antihypertensives and Lipitor  Hematuria, resolved - Traumatic Foley removal during agitation episode by patient - Foley reinserted    Mom updated in detail 5/24  Best Practice (right click and "Reselect all SmartList Selections" daily)   Diet/type: tubefeeds DVT prophylaxis prophylactic heparin   Pressure ulcer(s): N/A GI prophylaxis: H2B Lines: N/A Foley:  Yes, and it is still needed Code Status:  full code     The patient is critically ill with multiple organ system failure and requires high complexity decision making for assessment and support, frequent evaluation and titration of therapies, advanced monitoring, review of radiographic studies and interpretation of complex data.   Critical Care Time devoted to patient care services, exclusive of separately  billable procedures, described in this note is 34 minutes.   Jennifer Moellers Villa Greaser MD  01/23/2024, 8:36 AM  Please see [email or web address] for pager details.  From 7A-7P if no response, please call [phone]. After hours, please call ELink [phone].

## 2024-01-24 ENCOUNTER — Inpatient Hospital Stay (HOSPITAL_COMMUNITY)

## 2024-01-24 DIAGNOSIS — J690 Pneumonitis due to inhalation of food and vomit: Secondary | ICD-10-CM | POA: Diagnosis not present

## 2024-01-24 DIAGNOSIS — J9601 Acute respiratory failure with hypoxia: Secondary | ICD-10-CM | POA: Diagnosis not present

## 2024-01-24 DIAGNOSIS — G9081 Serotonin syndrome: Secondary | ICD-10-CM | POA: Diagnosis not present

## 2024-01-24 DIAGNOSIS — F10931 Alcohol use, unspecified with withdrawal delirium: Secondary | ICD-10-CM | POA: Diagnosis not present

## 2024-01-24 DIAGNOSIS — G9341 Metabolic encephalopathy: Secondary | ICD-10-CM | POA: Diagnosis not present

## 2024-01-24 LAB — BLOOD GAS, ARTERIAL
Acid-Base Excess: 3.4 mmol/L — ABNORMAL HIGH (ref 0.0–2.0)
Acid-Base Excess: 3.5 mmol/L — ABNORMAL HIGH (ref 0.0–2.0)
Bicarbonate: 26.8 mmol/L (ref 20.0–28.0)
Bicarbonate: 27.8 mmol/L (ref 20.0–28.0)
Drawn by: 31394
Drawn by: 31394
FIO2: 100 %
MECHVT: 580 mL
O2 Content: 4 L/min
O2 Saturation: 96.3 %
O2 Saturation: 100 %
PEEP: 5 cmH2O
Patient temperature: 40.1
Patient temperature: 39.4
RATE: 20 {breaths}/min
pCO2 arterial: 41 mmHg (ref 32–48)
pCO2 arterial: 44 mmHg (ref 32–48)
pH, Arterial: 7.43 (ref 7.35–7.45)
pH, Arterial: 7.41 (ref 7.35–7.45)
pO2, Arterial: 83 mmHg (ref 83–108)
pO2, Arterial: 189 mmHg — ABNORMAL HIGH (ref 83–108)

## 2024-01-24 LAB — GLUCOSE, CAPILLARY
Glucose-Capillary: 74 mg/dL (ref 70–99)
Glucose-Capillary: 77 mg/dL (ref 70–99)
Glucose-Capillary: 125 mg/dL — ABNORMAL HIGH (ref 70–99)
Glucose-Capillary: 129 mg/dL — ABNORMAL HIGH (ref 70–99)
Glucose-Capillary: 95 mg/dL (ref 70–99)
Glucose-Capillary: 129 mg/dL — ABNORMAL HIGH (ref 70–99)

## 2024-01-24 LAB — CG4 I-STAT (LACTIC ACID): Lactic Acid, Venous: 1 mmol/L (ref 0.5–1.9)

## 2024-01-24 LAB — PHOSPHORUS: Phosphorus: 4.9 mg/dL — ABNORMAL HIGH (ref 2.5–4.6)

## 2024-01-24 LAB — RESPIRATORY PANEL BY PCR

## 2024-01-24 LAB — BASIC METABOLIC PANEL WITH GFR
Anion gap: 10 (ref 5–15)
BUN: 12 mg/dL (ref 6–20)
CO2: 27 mmol/L (ref 22–32)
Calcium: 8.4 mg/dL — ABNORMAL LOW (ref 8.9–10.3)
Chloride: 106 mmol/L (ref 98–111)
Creatinine, Ser: 1.21 mg/dL (ref 0.61–1.24)
GFR, Estimated: >60 mL/min (ref >60)
Glucose, Bld: 125 mg/dL — ABNORMAL HIGH (ref 70–99)
Potassium: 3.5 mmol/L (ref 3.5–5.1)
Sodium: 143 mmol/L (ref 135–145)

## 2024-01-24 LAB — LACTIC ACID, PLASMA: Lactic Acid, Venous: 1.6 mmol/L (ref 0.5–1.9)

## 2024-01-24 LAB — HEPATIC FUNCTION PANEL
ALT: 68 U/L — ABNORMAL HIGH (ref 0–44)
AST: 98 U/L — ABNORMAL HIGH (ref 15–41)
Albumin: 2.8 g/dL — ABNORMAL LOW (ref 3.5–5.0)
Alkaline Phosphatase: 158 U/L — ABNORMAL HIGH (ref 38–126)
Bilirubin, Direct: 0.3 mg/dL — ABNORMAL HIGH (ref 0.0–0.2)
Indirect Bilirubin: 0.5 mg/dL (ref 0.3–0.9)
Total Bilirubin: 0.8 mg/dL (ref 0.0–1.2)
Total Protein: 6.5 g/dL (ref 6.5–8.1)

## 2024-01-24 LAB — CBC WITH DIFFERENTIAL/PLATELET
Abs Immature Granulocytes: 0.05 10*3/uL (ref 0.00–0.07)
Basophils Absolute: 0.1 10*3/uL (ref 0.0–0.1)
Basophils Relative: 1 %
Eosinophils Absolute: 0.6 10*3/uL — ABNORMAL HIGH (ref 0.0–0.5)
Eosinophils Relative: 9 %
HCT: 28.9 % — ABNORMAL LOW (ref 39.0–52.0)
Hemoglobin: 9 g/dL — ABNORMAL LOW (ref 13.0–17.0)
Immature Granulocytes: 1 %
Lymphocytes Relative: 17 %
Lymphs Abs: 1 10*3/uL (ref 0.7–4.0)
MCH: 30.1 pg (ref 26.0–34.0)
MCHC: 31.1 g/dL (ref 30.0–36.0)
MCV: 96.7 fL (ref 80.0–100.0)
Monocytes Absolute: 0.6 10*3/uL (ref 0.1–1.0)
Monocytes Relative: 9 %
Neutro Abs: 3.7 10*3/uL (ref 1.7–7.7)
Neutrophils Relative %: 63 %
Platelets: 204 10*3/uL (ref 150–400)
RBC: 2.99 MIL/uL — ABNORMAL LOW (ref 4.22–5.81)
RDW: 13.9 % (ref 11.5–15.5)
WBC: 6 10*3/uL (ref 4.0–10.5)
nRBC: 0 % (ref 0.0–0.2)

## 2024-01-24 LAB — ACETAMINOPHEN LEVEL: Acetaminophen (Tylenol), Serum: <10 ug/mL — ABNORMAL LOW (ref 10–30)

## 2024-01-24 LAB — SALICYLATE LEVEL: Salicylate Lvl: <7 mg/dL — ABNORMAL LOW (ref 7.0–30.0)

## 2024-01-24 LAB — PROCALCITONIN: Procalcitonin: 0.1 ng/mL

## 2024-01-24 LAB — LIPASE, BLOOD: Lipase: 63 U/L — ABNORMAL HIGH (ref 11–51)

## 2024-01-24 LAB — CK: Total CK: 2194 U/L — ABNORMAL HIGH (ref 49–397)

## 2024-01-24 LAB — MAGNESIUM: Magnesium: 2.3 mg/dL (ref 1.7–2.4)

## 2024-01-24 MED ORDER — NOREPINEPHRINE 4 MG/250ML-% IV SOLN
0.0000 ug/min | INTRAVENOUS | Status: DC
  Administered 2024-01-24: 2 ug/min via INTRAVENOUS
  Filled 2024-01-24: qty 250

## 2024-01-24 MED ORDER — DEXMEDETOMIDINE HCL IN NACL 400 MCG/100ML IV SOLN
0.0000 ug/kg/h | INTRAVENOUS | Status: AC
  Administered 2024-01-24 (×3): 1.5 ug/kg/h via INTRAVENOUS
  Administered 2024-01-25: 1.1 ug/kg/h via INTRAVENOUS
  Administered 2024-01-25: 0.8 ug/kg/h via INTRAVENOUS
  Filled 2024-01-24 (×6): qty 100

## 2024-01-24 MED ORDER — ACETAMINOPHEN 160 MG/5ML PO SOLN
1000.0000 mg | Freq: Four times a day (QID) | ORAL | Status: AC | PRN
  Administered 2024-01-24 – 2024-01-26 (×4): 1000 mg
  Filled 2024-01-24 (×4): qty 40.6

## 2024-01-24 MED ORDER — LACTATED RINGERS IV BOLUS
500.0000 mL | Freq: Once | INTRAVENOUS | Status: AC
  Administered 2024-01-24: 500 mL via INTRAVENOUS

## 2024-01-24 MED ORDER — MIDAZOLAM HCL 2 MG/2ML IJ SOLN: 1.0000 mg | INTRAMUSCULAR | Status: DC | PRN

## 2024-01-24 MED ORDER — SUCCINYLCHOLINE CHLORIDE 200 MG/10ML IV SOSY
PREFILLED_SYRINGE | INTRAVENOUS | Status: AC
  Filled 2024-01-24: qty 10

## 2024-01-24 MED ORDER — KETAMINE BOLUS VIA INFUSION
0.2000 mg/kg | Freq: Once | INTRAVENOUS | Status: AC
  Administered 2024-01-24: 26.06 mg via INTRAVENOUS
  Filled 2024-01-24: qty 30

## 2024-01-24 MED ORDER — MIDAZOLAM HCL 2 MG/2ML IJ SOLN
INTRAMUSCULAR | Status: AC
  Administered 2024-01-24: 2 mg
  Filled 2024-01-24: qty 2

## 2024-01-24 MED ORDER — DOCUSATE SODIUM 50 MG/5ML PO LIQD
100.0000 mg | Freq: Two times a day (BID) | ORAL | Status: DC
  Administered 2024-01-27: 100 mg
  Filled 2024-01-24 (×3): qty 10

## 2024-01-24 MED ORDER — ROCURONIUM BROMIDE 10 MG/ML (PF) SYRINGE
PREFILLED_SYRINGE | INTRAVENOUS | Status: AC
  Administered 2024-01-24: 100 mg
  Filled 2024-01-24: qty 10

## 2024-01-24 MED ORDER — HALOPERIDOL LACTATE 5 MG/ML IJ SOLN
5.0000 mg | Freq: Once | INTRAMUSCULAR | Status: AC
  Administered 2024-01-24: 5 mg via INTRAVENOUS
  Filled 2024-01-24: qty 1

## 2024-01-24 MED ORDER — FENTANYL CITRATE (PF) 100 MCG/2ML IJ SOLN
INTRAMUSCULAR | Status: AC
Start: 2024-01-24 — End: 2024-01-24
  Administered 2024-01-24: 100 ug
  Filled 2024-01-24: qty 2

## 2024-01-24 MED ORDER — HYDROMORPHONE HCL 1 MG/ML IJ SOLN: 1.0000 mg | INTRAMUSCULAR | Status: DC | PRN

## 2024-01-24 MED ORDER — DANTROLENE SODIUM 250 MG IV SUSR
2.5000 mg/kg | INTRAVENOUS | Status: AC
  Administered 2024-01-24: 240 mg via INTRAVENOUS
  Filled 2024-01-24: qty 5

## 2024-01-24 MED ORDER — IBUPROFEN 100 MG/5ML PO SUSP
800.0000 mg | Freq: Three times a day (TID) | ORAL | Status: DC | PRN
  Administered 2024-01-26 – 2024-01-27 (×3): 800 mg
  Filled 2024-01-24 (×5): qty 40

## 2024-01-24 MED ORDER — MIDAZOLAM BOLUS VIA INFUSION
0.0000 mg | INTRAVENOUS | Status: DC | PRN
  Administered 2024-01-24: 2 mg via INTRAVENOUS
  Administered 2024-01-24 – 2024-01-25 (×3): 5 mg via INTRAVENOUS
  Administered 2024-01-26: 1 mg via INTRAVENOUS
  Administered 2024-01-26: 5 mg via INTRAVENOUS

## 2024-01-24 MED ORDER — HYDROMORPHONE HCL-NACL 50-0.9 MG/50ML-% IV SOLN
1.0000 mg/h | INTRAVENOUS | Status: DC
  Administered 2024-01-24: 1 mg/h via INTRAVENOUS
  Administered 2024-01-24 – 2024-01-25 (×2): 2 mg/h via INTRAVENOUS
  Filled 2024-01-24 (×3): qty 50

## 2024-01-24 MED ORDER — KETAMINE HCL-SODIUM CHLORIDE 1000-0.69 MG/100ML-% IV SOLN
0.3000 mg/kg/h | INTRAVENOUS | Status: DC
  Administered 2024-01-24: 0.3 mg/kg/h via INTRAVENOUS

## 2024-01-24 MED ORDER — ETOMIDATE 2 MG/ML IV SOLN
INTRAVENOUS | Status: AC
  Administered 2024-01-24: 20 mg
  Filled 2024-01-24: qty 20

## 2024-01-24 MED ORDER — DEXMEDETOMIDINE HCL IN NACL 400 MCG/100ML IV SOLN
0.0000 ug/kg/h | INTRAVENOUS | Status: AC
Start: 2024-01-24 — End: 2024-01-24
  Administered 2024-01-24: 0.9 ug/kg/h via INTRAVENOUS
  Filled 2024-01-24 (×2): qty 100

## 2024-01-24 MED ORDER — EMPTY CONTAINERS FLEXIBLE MISC
240.0000 mg | Freq: Once | Status: DC
  Filled 2024-01-24: qty 720

## 2024-01-24 MED ORDER — DEXMEDETOMIDINE HCL IN NACL 400 MCG/100ML IV SOLN
0.0000 ug/kg/h | INTRAVENOUS | Status: DC
  Administered 2024-01-24: 1.5 ug/kg/h via INTRAVENOUS
  Filled 2024-01-24: qty 100

## 2024-01-24 MED ORDER — FENTANYL CITRATE PF 50 MCG/ML IJ SOSY
PREFILLED_SYRINGE | INTRAMUSCULAR | Status: DC
Start: 2024-01-24 — End: 2024-01-24
  Filled 2024-01-24: qty 2

## 2024-01-24 MED ORDER — ROCURONIUM BROMIDE 10 MG/ML (PF) SYRINGE: 100.0000 mg | PREFILLED_SYRINGE | Freq: Once | INTRAVENOUS | Status: AC

## 2024-01-24 MED ORDER — HYDROMORPHONE HCL 1 MG/ML IJ SOLN
INTRAMUSCULAR | Status: AC
Start: 2024-01-24 — End: 2024-01-24
  Administered 2024-01-24: 1 mg
  Filled 2024-01-24: qty 1

## 2024-01-24 MED ORDER — KETAMINE HCL 50 MG/5ML IJ SOSY
PREFILLED_SYRINGE | INTRAMUSCULAR | Status: AC
  Filled 2024-01-24: qty 10

## 2024-01-24 MED ORDER — MIDAZOLAM HCL 2 MG/2ML IJ SOLN: 2.0000 mg | Freq: Once | INTRAMUSCULAR | Status: AC

## 2024-01-24 MED ORDER — LACTATED RINGERS IV SOLN: INTRAVENOUS | Status: DC

## 2024-01-24 MED ORDER — EMPTY CONTAINERS FLEXIBLE MISC
2.5000 mg/kg | Freq: Once | Status: DC
  Filled 2024-01-24: qty 719.25

## 2024-01-24 MED ORDER — LORAZEPAM 1 MG PO TABS
1.0000 mg | ORAL_TABLET | Freq: Four times a day (QID) | ORAL | Status: DC
  Administered 2024-01-24: 1 mg
  Filled 2024-01-24: qty 1

## 2024-01-24 MED ORDER — DEXMEDETOMIDINE HCL IN NACL 200 MCG/50ML IV SOLN
0.0000 ug/kg/h | INTRAVENOUS | Status: DC
  Administered 2024-01-24 (×3): 1.5 ug/kg/h via INTRAVENOUS
  Filled 2024-01-24 (×2): qty 50

## 2024-01-24 MED ORDER — FENTANYL CITRATE (PF) 100 MCG/2ML IJ SOLN: 100.0000 ug | Freq: Once | INTRAMUSCULAR | Status: AC

## 2024-01-24 MED ORDER — KETAMINE HCL 10 MG/ML IJ SOLN
1.0000 mg/kg/h | Status: DC
  Administered 2024-01-24 – 2024-01-25 (×3): 1 mg/kg/h via INTRAVENOUS
  Filled 2024-01-24 (×6): qty 100

## 2024-01-24 MED ORDER — MIDAZOLAM HCL 2 MG/2ML IJ SOLN
1.0000 mg | INTRAMUSCULAR | Status: DC | PRN
  Administered 2024-01-24 (×2): 2 mg via INTRAVENOUS
  Filled 2024-01-24 (×2): qty 2

## 2024-01-24 MED ORDER — MIDAZOLAM-SODIUM CHLORIDE 100-0.9 MG/100ML-% IV SOLN
0.5000 mg/h | INTRAVENOUS | Status: DC
  Administered 2024-01-24: 2 mg/h via INTRAVENOUS
  Administered 2024-01-25 (×2): 5 mg/h via INTRAVENOUS
  Filled 2024-01-24 (×3): qty 100

## 2024-01-24 MED ORDER — KETAMINE HCL 10 MG/ML IJ SOLN
0.5000 mg/kg/h | Status: DC
  Administered 2024-01-24: 0.3 mg/kg/h via INTRAVENOUS
  Filled 2024-01-24: qty 100

## 2024-01-24 MED ORDER — ARTIFICIAL TEARS OPHTHALMIC OINT
TOPICAL_OINTMENT | Freq: Four times a day (QID) | OPHTHALMIC | Status: DC | PRN
  Administered 2024-01-25: 1 via OPHTHALMIC
  Filled 2024-01-24: qty 3.5

## 2024-01-24 MED ORDER — LORAZEPAM 1 MG PO TABS
2.0000 mg | ORAL_TABLET | Freq: Four times a day (QID) | ORAL | Status: DC
  Administered 2024-01-24 – 2024-01-27 (×15): 2 mg
  Filled 2024-01-24 (×15): qty 2

## 2024-01-24 MED ORDER — PANTOPRAZOLE SODIUM 40 MG IV SOLR
40.0000 mg | Freq: Every day | INTRAVENOUS | Status: DC
Start: 2024-01-24 — End: 2024-01-28
  Administered 2024-01-24 – 2024-01-27 (×4): 40 mg via INTRAVENOUS
  Filled 2024-01-24 (×4): qty 10

## 2024-01-24 MED ORDER — LABETALOL HCL 5 MG/ML IV SOLN
20.0000 mg | Freq: Once | INTRAVENOUS | Status: AC
  Administered 2024-01-24: 20 mg via INTRAVENOUS
  Filled 2024-01-24: qty 4

## 2024-01-24 MED ORDER — CYPROHEPTADINE HCL 4 MG PO TABS: 2.0000 mg | ORAL_TABLET | ORAL | Status: DC | PRN

## 2024-01-24 MED ORDER — HYDROMORPHONE BOLUS VIA INFUSION
0.2500 mg | INTRAVENOUS | Status: DC | PRN
  Administered 2024-01-24 – 2024-01-26 (×6): 2 mg via INTRAVENOUS

## 2024-01-24 MED ORDER — POLYETHYLENE GLYCOL 3350 17 G PO PACK
17.0000 g | PACK | Freq: Every day | ORAL | Status: DC
Start: 2024-01-25 — End: 2024-01-27
  Administered 2024-01-25: 17 g
  Filled 2024-01-24 (×2): qty 1

## 2024-01-24 MED ORDER — SODIUM BICARBONATE 8.4 % IV SOLN
INTRAVENOUS | Status: DC
Start: 2024-01-24 — End: 2024-01-24
  Filled 2024-01-24: qty 50

## 2024-01-24 MED ORDER — ETOMIDATE 2 MG/ML IV SOLN: 20.0000 mg | Freq: Once | INTRAVENOUS | Status: AC

## 2024-01-24 MED ORDER — STERILE WATER FOR INJECTION IJ SOLN
INTRAMUSCULAR | Status: AC
  Filled 2024-01-24: qty 10

## 2024-01-24 MED ORDER — LACTATED RINGERS IV BOLUS
1000.0000 mL | Freq: Once | INTRAVENOUS | Status: AC
  Administered 2024-01-24: 1000 mL via INTRAVENOUS

## 2024-01-24 MED ORDER — CYPROHEPTADINE HCL 4 MG PO TABS
12.0000 mg | ORAL_TABLET | Freq: Once | ORAL | Status: DC
  Administered 2024-01-24: 12 mg
  Filled 2024-01-24: qty 3

## 2024-01-24 MED ORDER — HYDROMORPHONE HCL 1 MG/ML IJ SOLN
1.0000 mg | INTRAMUSCULAR | Status: DC | PRN
  Administered 2024-01-24: 1 mg via INTRAVENOUS
  Filled 2024-01-24: qty 1

## 2024-01-24 NOTE — Procedures (Signed)
 Extubation Procedure Note  Patient Details:   Name: Juan Clarke DOB: 06-14-1981 MRN: [id number]   Airway Documentation:    Vent end date: 01/24/24 Vent end time: 0820   Evaluation  O2 sats: stable throughout Complications: No apparent complications Patient did tolerate procedure well. Bilateral Breath Sounds: Rhonchi, Diminished   Yes Patient tolerated wean. Patient pulling 1.5L when told to take deep breath. Positive for cuff leak. Patient extubated to a 3L [place]. No signs of dyspnea or stridor noted. Patient resting comfortably. RN at bedside.   [name] 01/24/2024, 8:28 AM

## 2024-01-24 NOTE — Plan of Care (Signed)
  Problem: Fluid Volume: Goal: Ability to maintain a balanced intake and output will improve Outcome: Progressing   Problem: Metabolic: Goal: Ability to maintain appropriate glucose levels will improve Outcome: Progressing   Problem: Nutritional: Goal: Maintenance of adequate nutrition will improve Outcome: Progressing   Problem: Skin Integrity: Goal: Risk for impaired skin integrity will decrease Outcome: Progressing   Problem: Clinical Measurements: Goal: Respiratory complications will improve Outcome: Progressing Goal: Cardiovascular complication will be avoided Outcome: Progressing

## 2024-01-24 NOTE — Progress Notes (Signed)
 eLink Physician-Brief Progress Note Patient Name: Juan Clarke DOB: 03-25-1981 MRN: [id number]   Date of Service  01/24/2024  HPI/Events of Note  Received inquiry regarding Seroquel for patient with Serotonin syndorm  eICU Interventions  Conferred with pharm D and this should be ok to give     Intervention Category Minor Interventions: Other:  [name] 01/24/2024, 10:53 PM

## 2024-01-24 NOTE — Progress Notes (Signed)
 Patient very confused/agitated/with temp 103 on cooling blanket with ice packs. Patient combative with nurses at bedside. Informed CCM of findings; see new orders.

## 2024-01-24 NOTE — Progress Notes (Addendum)
 PCCM Interval Progress Note  Extubated earlier, initially did well.  Now spiking fevers 103 - 104F. Received 325 Acetaminophen  already. Currently on cooling blanket plus ice packs.  Agitated and restless despite 0.6 Precedex . Starting to try pull at lines. A bit delusional. HR 130 - 160. SBP 180 - 220. Received 10mg  Labetalol roughly 1 hour ago along with 2mg  Lorazepam  and 2mg  Midazolam .  QTc 400.  - EKG. - Additional 20mg  Labetalol x 1 now. -  5mg  Haldol  x 1. - Administer 1mg  Dilaudid  (already ordered). - Increase Precedex  ceiling to 1.5. - Give Dantrolene 2.5mg /kg. - Increase Acetaminophen  to 1g q6hrs x 4 doses. - Add Ibuprofen 800mg  q8hrs PRN fever. - Continue external cooling measures. - Consider adding back Ketamine infusion low dose if no response to above. - Add STAT CK, Lactic Acid, PCT. - Worst case if fever/agitation persists and can't control, might need to consider re-intubation and deeper sedation.   ADDENDUM 1415:  Remains agitated but is redirectable. He is intermittently confused but is able to answer basic orientation questions correctly. Labs returned: lactate, PCT, ABG, Lipase all normal. CK up at 2194. Temp down from 105 to 102 after Dantrolene. Currently on 1.5 Precedex .   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  On exam, he does in fact have mild ocular clonus and he is clearly agitated. He was diaphoretic earlier in the morning and afternoon and he has had intermittent tremors. He does not have noticeable hyperreflexia in upper or lower extremities and he does not have reproducible clonus; however, these are not necessarily required to clinically diagnose SS.  As such, will order cyprohepatadine 12mg  per tube x 1 followed by 2mg  q2hrs PRN for RASS > +1 (Goal RASS 0 to +1). Have also ordered 1L LR followed by MIVF 100/hr LR given rhabdo in setting serotonin  syndrome.  Hold off on reintubation and sedation for now. Will follow up again in the next few hours.   ADDENDUM 1630:  Ongoing agitation despite Precedex  and above measures (Cyprohepatadine). Discussed with pharmacy earlier regarding low dose Ketamine infusion. Will order 0.2mg /kg bolus followed by low dose infusion 0.3mg /kg/hr. Re-evaluate in the next 30 - 60 minutes regarding effect or not. If not and no change, then proceed with intubation. Will update family.   ADDENDUM 1720:  No changes despite above. Discussed with mother and wife and best and safest course here is to proceed with re-intubation and sedation again especially prior to night shift change over.  Reintubated easily. Transient drop in O2 sats all the way to single digits but doubt accuracy as rebounded very quickly to 100. Sedation and mechanical ventilation reordered.   Additional CC time throughout the day including chart review, discussions with nursing and pharmacy at least 60 minutes.   Rafael Bun, PA - C [facility] For pager details, please see AMION or use Epic chat  After 1900, please call [facility] for cross coverage needs 01/24/2024, 11:22 AM

## 2024-01-24 NOTE — Progress Notes (Signed)
 eLink Physician-Brief Progress Note Patient Name: Juan Clarke DOB: 1980/12/14 MRN: [id number]   Date of Service  01/24/2024  HPI/Events of Note  Received request to re-order Ketamine and Precedex   eICU Interventions  Ketamine is still ordered Precedex  ordered to complete in 4 hours (unclear reason) ordered to continued for 10 more hours     Intervention Category Intermediate Interventions: Other:  [name] 01/24/2024, 11:12 PM

## 2024-01-24 NOTE — Progress Notes (Signed)
 Patient aspirating, stopped tube feed. Patient to be intubated and tube feed to remain off until patient safely able to receive tube feed.

## 2024-01-24 NOTE — Progress Notes (Addendum)
 NAME:  Juan Clarke, MRN:  [id number], DOB:  1981-03-23, LOS: 6 ADMISSION DATE:  01/17/2024, CONSULTATION DATE:  01/18/2024 REFERRING MD:  Eldon Greenland, MD, CHIEF COMPLAINT:  ETOH withdrawal   History of Present Illness:  43 y/o male with  Alcoholism, who presented to ED via EMS after he was found passed out on the cough.  He was able to eventually arouse but very confused and not able to effectively communicate.  In the ED patient very diaphoretic and confused.  However, he became increasingly more agitated.  He has received multiples doses of Ativan  and received 200mg  IV Phenobarbital .  However, he had to be placed on 4 point restraints.  His CIWA score was above 21.   ED labs revealed an increased LA 4.1, Ammonia level 125.   Pertinent  Medical History    Alcoholism F10.20 ; Severe major depression F32.2 ; Trigeminal neuralgia G50.0 ; Acquired hypothyroidism E03.9 ; Mixed hyperlipidemia E78.2 ; Essential (primary) hypertension I10 ; Idiopathic chronic gout without tophus,     Significant Hospital Events: Including procedures, antibiotic start and stop dates in addition to other pertinent events   5/21 Intubated and admitted to ICU 5/22 Worsening agitation overnight with increasing sedation requirements >>propofol dc'd due to high TGS,  ketamine added 5/22 Vomiting around ETT, aspiration   5/23 high-grade fever 104, ketamine titrated to 2 mg due to severe agitation 5/24 breakthrough sedation, resume Versed  drip, added Seroquel, enteral Ativan   5/25 several pushes for agitation, finally given vecuronium x 1 , left IJ CVL placed 5/25 Episode of desaturation with mucous plugs, improved with bagging and suctioning 5/27 extubated  Interim History / Subjective:  Was on SBT at [facility] 5/5 at the time of my exam, awake despite being on Ketamine, Dilaudid , Precedex , Midazolam . Follows all commands. RSBI 20 - 25  Objective    Blood pressure (!) 157/113, pulse (!) 110, temperature 99.9 F  (37.7 C), temperature source Other (Comment), resp. rate 20, height 5\' 10"  (1.778 m), weight 130.3 kg, SpO2 98%. CVP:  [15 mmHg-18 mmHg] 18 mmHg  Vent Mode: PS[facility];CPAP FiO2 (%):  [35 %] 35 % Set Rate:  [20 bmp] 20 bmp Vt Set:  [580 mL] 580 mL PEEP:  [5 cmH20] 5 cmH20 Pressure Support:  [5 cmH20] 5 cmH20 Plateau Pressure:  [19 cmH20-22 cmH20] 19 cmH20   Intake/Output Summary (Last 24 hours) at 01/24/2024 0856 Last data filed at 01/24/2024 0758 Gross per 24 hour  Intake 3903.68 ml  Output 2745 ml  Net 1158.68 ml   Filed Weights   01/21/24 0500 01/22/24 0500 01/23/24 0459  Weight: 130 kg 128.9 kg 130.3 kg    Examination: General: Adult male, awake and slightly agitated on vent but in NAD. Neuro: Awake on vent despite multiple sedation agents. Follows all commands. HEENT: [place]/AT. Sclerae anicteric. ETT in place. Cardiovascular: RRR, no M/R/G.  Lungs: Respirations even and unlabored.  CTA bilaterally, No W/R/R. Abdomen: BS x 4, soft, NT/ND.  Musculoskeletal: No gross deformities, no edema.    Assessment and Plan   Acute Metabolic Encephalopathy  Acute ETOH withdrawal, Delirium Tremens  -mother reports he has been sober for 3 months with relapse , have to consider serotonin syndrome although no myoclonus Hyperammonemia Intubated for airway protection  P:  Continue Precedex  for now Stop Dilaudid , Ketamine, Midazolam  and proceed with extubation, see below Continue enteral Ativan  scheduled 2mg  4 times daily and IV per CIWA protocol Continue Lactulose  20g BID  Continue Seroquel 200mg  TID Continue thiamine , folic  acid, multivitamin  Acute Hypoxic Respiratory Failure secondary to above  Aspiration pneumonia, witnessed aspiration 5/22 Episode of mucous plugging 5/25 P:  Extubate now Bedside swallow eval and advance as able (already asking for sprite) Continue Unasyn Bronchial hygiene Continue Chest PT  Hypertriglyceridemia - worsened by propofol,  lipase normal P: Track TG's  intermittently  Ileus - improving, likely related to high-dose opiates and immobilization Nutrition P: Avoid opiates as able Completed reglan Continue trickle TF's until taking and tolerating PO  HTN  HLD P:  Holding antihypertensives and Lipitor  Hematuria, resolved - 2/2 traumatic Foley removal during agitation episode by patient P: Continue new foley for now, remove as he awakens more and after extubation  Mobility P: PT/OT eval   Monitor in ICU following extubation. Will revisit later this afternoon and if doing well will downgrade to progressive and ask TRH to assume care in AM 5/28 with PCCM off.  Best Practice (right click and "Reselect all SmartList Selections" daily)   Diet/type: tubefeeds DVT prophylaxis prophylactic heparin   Pressure ulcer(s): N/A GI prophylaxis: H2B Lines: N/A Foley:  Yes, and it is still needed Code Status:  full code  CC time: 35 min.    Rafael Bun, PA - C [facility] For pager details, please see AMION or use Epic chat  After 1900, please call ELINK for cross coverage needs 01/24/2024, 9:15 AM

## 2024-01-24 NOTE — Progress Notes (Signed)
 Post itnuation rounds  Still very agitated ET tube satisfctory  Plan - increase dilaudid  range  - incrase versed  range - increse keptamine range  - continue precedex      SIGNATURE    Dr. Maire Scot, M.D., F.C.C.P,  Pulmonary and Critical Care Medicine Staff Physician, [facility] Director - Interstitial Lung Disease  Program  Pulmonary Fibrosis [facility] at [facility] [place], [place], [address]   Pager: [phone], If no answer  -> Check AMION or Try [phone] Telephone (clinical office): [phone] Telephone (research): [phone]  6:36 PM 01/24/2024

## 2024-01-24 NOTE — Procedures (Signed)
 Intubation Procedure Note  Juan Clarke  [id number]  04-24-1981  Date:01/24/24  Time:5:43 PM   Provider Performing:Janine Reller Dione Franks    Procedure: Intubation (31500)  Indication(s) Respiratory Failure  Consent Risks of the procedure as well as the alternatives and risks of each were explained to the patient and/or caregiver.  Consent for the procedure was obtained and is signed in the bedside chart   Anesthesia Etomidate , Versed , Fentanyl , and Rocuronium   Time Out Verified patient identification, verified procedure, site/side was marked, verified correct patient position, special equipment/implants available, medications/allergies/relevant history reviewed, required imaging and test results available.   Sterile Technique Usual hand hygeine, masks, and gloves were used   Procedure Description Patient positioned in bed supine.  Sedation given as noted above.  Patient was intubated with endotracheal tube using Glidescope.  View was Grade 1 full glottis .  Number of attempts was 1.  Colorimetric CO2 detector was consistent with tracheal placement.   Complications/Tolerance None; patient tolerated the procedure well. Transient drop in O2 sats during bagging but question accuracy as rebounded very quickly to 100 as soon as started bagging again after trachea was intubated. Chest X-ray is ordered to verify placement.   EBL Minimal   Specimen(s) None   Rafael Bun, PA - C [facility] For pager details, please see AMION or use Epic chat  After 1900, please call [facility] for cross coverage needs 01/24/2024, 5:44 PM

## 2024-01-25 ENCOUNTER — Inpatient Hospital Stay (HOSPITAL_COMMUNITY)

## 2024-01-25 DIAGNOSIS — R6510 Systemic inflammatory response syndrome (SIRS) of non-infectious origin without acute organ dysfunction: Secondary | ICD-10-CM

## 2024-01-25 DIAGNOSIS — F10931 Alcohol use, unspecified with withdrawal delirium: Secondary | ICD-10-CM | POA: Diagnosis not present

## 2024-01-25 DIAGNOSIS — J9601 Acute respiratory failure with hypoxia: Secondary | ICD-10-CM | POA: Diagnosis not present

## 2024-01-25 DIAGNOSIS — G9081 Serotonin syndrome: Secondary | ICD-10-CM | POA: Diagnosis not present

## 2024-01-25 LAB — BASIC METABOLIC PANEL WITH GFR
Anion gap: 9 (ref 5–15)
BUN: 18 mg/dL (ref 6–20)
CO2: 26 mmol/L (ref 22–32)
Calcium: 8.4 mg/dL — ABNORMAL LOW (ref 8.9–10.3)
Chloride: 103 mmol/L (ref 98–111)
Creatinine, Ser: 0.84 mg/dL (ref 0.61–1.24)
GFR, Estimated: >60 mL/min (ref >60)
Glucose, Bld: 118 mg/dL — ABNORMAL HIGH (ref 70–99)
Potassium: 3.5 mmol/L (ref 3.5–5.1)
Sodium: 138 mmol/L (ref 135–145)

## 2024-01-25 LAB — CULTURE, RESPIRATORY W GRAM STAIN

## 2024-01-25 LAB — GLUCOSE, CAPILLARY
Glucose-Capillary: 103 mg/dL — ABNORMAL HIGH (ref 70–99)
Glucose-Capillary: 105 mg/dL — ABNORMAL HIGH (ref 70–99)
Glucose-Capillary: 112 mg/dL — ABNORMAL HIGH (ref 70–99)
Glucose-Capillary: 113 mg/dL — ABNORMAL HIGH (ref 70–99)
Glucose-Capillary: 115 mg/dL — ABNORMAL HIGH (ref 70–99)
Glucose-Capillary: 54 mg/dL — ABNORMAL LOW (ref 70–99)
Glucose-Capillary: 84 mg/dL (ref 70–99)
Glucose-Capillary: 99 mg/dL (ref 70–99)

## 2024-01-25 LAB — TRIGLYCERIDES: Triglycerides: 268 mg/dL — ABNORMAL HIGH (ref <150)

## 2024-01-25 LAB — CBC
HCT: 28.3 % — ABNORMAL LOW (ref 39.0–52.0)
Hemoglobin: 8.9 g/dL — ABNORMAL LOW (ref 13.0–17.0)
MCH: 30.6 pg (ref 26.0–34.0)
MCHC: 31.4 g/dL (ref 30.0–36.0)
MCV: 97.3 fL (ref 80.0–100.0)
Platelets: 179 10*3/uL (ref 150–400)
RBC: 2.91 MIL/uL — ABNORMAL LOW (ref 4.22–5.81)
RDW: 13.4 % (ref 11.5–15.5)
WBC: 6.2 10*3/uL (ref 4.0–10.5)
nRBC: 0 % (ref 0.0–0.2)

## 2024-01-25 LAB — MAGNESIUM: Magnesium: 2.4 mg/dL (ref 1.7–2.4)

## 2024-01-25 LAB — PHOSPHORUS: Phosphorus: 4.9 mg/dL — ABNORMAL HIGH (ref 2.5–4.6)

## 2024-01-25 MED ORDER — PHENOBARBITAL 32.4 MG PO TABS
97.2000 mg | ORAL_TABLET | Freq: Three times a day (TID) | ORAL | Status: DC
  Administered 2024-01-25 – 2024-01-26 (×3): 97.2 mg
  Filled 2024-01-25 (×3): qty 3

## 2024-01-25 MED ORDER — PHENOBARBITAL 32.4 MG PO TABS
32.4000 mg | ORAL_TABLET | Freq: Three times a day (TID) | ORAL | Status: DC
  Administered 2024-01-27 (×2): 32.4 mg
  Filled 2024-01-25 (×2): qty 1

## 2024-01-25 MED ORDER — FUROSEMIDE 10 MG/ML IJ SOLN
80.0000 mg | Freq: Once | INTRAMUSCULAR | Status: AC
  Administered 2024-01-25: 80 mg via INTRAVENOUS
  Filled 2024-01-25: qty 8

## 2024-01-25 MED ORDER — DEXMEDETOMIDINE HCL IN NACL 400 MCG/100ML IV SOLN
0.0000 ug/kg/h | INTRAVENOUS | Status: AC
  Administered 2024-01-25: 1.2 ug/kg/h via INTRAVENOUS
  Administered 2024-01-25: 0.6 ug/kg/h via INTRAVENOUS
  Administered 2024-01-25: 0.4 ug/kg/h via INTRAVENOUS
  Filled 2024-01-25 (×2): qty 100

## 2024-01-25 MED ORDER — DEXMEDETOMIDINE HCL IN NACL 200 MCG/50ML IV SOLN
0.0000 ug/kg/h | INTRAVENOUS | Status: AC
  Administered 2024-01-25: 0.8 ug/kg/h via INTRAVENOUS
  Filled 2024-01-25: qty 50

## 2024-01-25 MED ORDER — HYDRALAZINE HCL 20 MG/ML IJ SOLN
10.0000 mg | Freq: Once | INTRAMUSCULAR | Status: AC
  Administered 2024-01-25: 10 mg via INTRAVENOUS
  Filled 2024-01-25: qty 1

## 2024-01-25 MED ORDER — DEXTROSE 50 % IV SOLN
INTRAVENOUS | Status: AC
  Filled 2024-01-25: qty 50

## 2024-01-25 MED ORDER — DEXTROSE 50 % IV SOLN
25.0000 g | INTRAVENOUS | Status: AC
Start: 2024-01-25 — End: 2024-01-25
  Administered 2024-01-25: 25 g via INTRAVENOUS

## 2024-01-25 MED ORDER — ORAL CARE MOUTH RINSE
15.0000 mL | OROMUCOSAL | Status: DC
Start: 2024-01-25 — End: 2024-01-27
  Administered 2024-01-25 – 2024-01-27 (×17): 15 mL via OROMUCOSAL

## 2024-01-25 MED ORDER — POTASSIUM CHLORIDE 20 MEQ PO PACK
40.0000 meq | PACK | Freq: Once | ORAL | Status: AC
  Administered 2024-01-25: 40 meq
  Filled 2024-01-25: qty 2

## 2024-01-25 MED ORDER — PHENOBARBITAL 32.4 MG PO TABS
64.8000 mg | ORAL_TABLET | Freq: Three times a day (TID) | ORAL | Status: DC
  Administered 2024-01-26 – 2024-01-27 (×3): 64.8 mg
  Filled 2024-01-25 (×3): qty 2

## 2024-01-25 MED ORDER — ORAL CARE MOUTH RINSE: 15.0000 mL | OROMUCOSAL | Status: DC | PRN

## 2024-01-25 NOTE — Progress Notes (Signed)
 Patient became agitated and biting his ET Tube after Precedex  was turned off. HR sustained in the 130 despite receiving 5MG  of versed  and a bolus of Dilaudid . CCM made aware.   Juan Clarke

## 2024-01-25 NOTE — Progress Notes (Signed)
 NAME:  Juan Clarke, MRN:  [id number], DOB:  03-20-1981, LOS: 7 ADMISSION DATE:  01/17/2024, CONSULTATION DATE:  01/18/2024 REFERRING MD: Wallis Gun - EDP, CHIEF COMPLAINT:  EtOH withdrawal   History of Present Illness:  43 y/o male with alcoholism, who presented to ED via EMS after he was found passed out on the cough.  He was able to eventually arouse but very confused and not able to effectively communicate.  In the ED patient very diaphoretic and confused.  However, he became increasingly more agitated.  He has received multiples doses of Ativan  and received 200mg  IV Phenobarbital .  However, he had to be placed on 4 point restraints.  His CIWA score was above 21.   ED labs revealed an increased LA 4.1, Ammonia level 125.  Pertinent Medical History:   Past Medical History:  Diagnosis Date   GAD (generalized anxiety disorder)    HTN (hypertension)    Insomnia    Migraine without aura    Overweight    Trigeminal neuralgia    Significant Hospital Events: Including procedures, antibiotic start and stop dates in addition to other pertinent events   5/21 - Intubated and admitted to ICU 5/22 - Worsening agitation overnight with increasing sedation requirements >>propofol dc'd due to high TGS,  ketamine added 5/22 - Vomiting around ETT, aspiration   5/23 - High-grade fever 104, ketamine titrated to 2 mg due to severe agitation 5/24 - Breakthrough sedation, resume Versed  drip, added Seroquel, enteral Ativan   5/25 - Several pushes for agitation, finally given vecuronium x 1 , left IJ CVL placed 5/25 - Episode of desaturation with mucous plugs, improved with bagging and suctioning 5/27 - Extubated. Increased agitation/delirium despite all available measures, ultimately reintubated. Febrile to 103-104F despite APAP, concerning for serotonin syndrome. Cyproheptadine given.  Interim History / Subjective:  Extubated yesterday afternoon Increasing delirium/agitation with inability to control  safely Required reintubation Concern for SS with Tmax 104F (several agents on home med list increasing risk) This AM, awake/following commands on significant amounts of sedation (Precedex , Dilaudid , Versed , Ketamine gtts) D/w pharmacy, repeat phenobarb taper to facilitate sedation weaning Will attempt OGT placement for abdominal decompression, ?ileus  Objective:   Blood pressure 124/80, pulse 61, temperature 97.6 F (36.4 C), temperature source Axillary, resp. rate 20, height 5\' 10"  (1.778 m), weight 131 kg, SpO2 100%. CVP:  [12 mmHg-19 mmHg] 16 mmHg  Vent Mode: PRVC FiO2 (%):  [35 %-100 %] 60 % Set Rate:  [16 bmp-20 bmp] 16 bmp Vt Set:  [580 mL] 580 mL PEEP:  [5 cmH20] 5 cmH20 Pressure Support:  [5 cmH20] 5 cmH20 Plateau Pressure:  [21 cmH20-25 cmH20] 21 cmH20   Intake/Output Summary (Last 24 hours) at 01/25/2024 0729 Last data filed at 01/25/2024 7829 Gross per 24 hour  Intake 5491.37 ml  Output 3895 ml  Net 1596.37 ml   Filed Weights   01/23/24 0459 01/24/24 0707 01/25/24 0500  Weight: 130.3 kg 133.1 kg 131 kg   Physical Examination: General: Acutely ill-appearing younger man in NAD. Awake but appears lethargic. HEENT: [place]/AT, mildly injected sclera with scleral edema noted, mild exophthalmos, PERRL 2mm reactive, moist mucous membranes. Neuro: Awake, slow to respond but nodding appropriately to questions. Responds to verbal stimuli. Following commands consistently. Moves all 4 extremities spontaneously. Generalized weakness. +Cough and +Gag  CV: RRR, no m/g/r. PULM: Breathing even and unlabored on vent (PEEP 5, FiO2 50%). Lung fields diminished bilaterally at bases, R > L. GI: Soft, nontender, mild-moderate distention, hypoactive bowel sounds.  Extremities: Trace bilateral symmetric LE edema noted. Skin: Warm/dry, no rashes.  Assessment and Plan:   Acute Metabolic Encephalopathy  Acute ETOH withdrawal, ?Delirium Tremens  - mother reports he has been sober for 3 months with  relapse ?Serotonin syndrome although no clonus, six different agents on home med list with risk for SS (paroxetine , duloxetine , carbamazepine , trazodone, dextromethorphan, ?Vyvanse), Zofran  on hospital med list (discontinued) Hyperammonemia S/p intubation for airway protection  - Monitor for signs/symptoms of withdrawal - Precedex , wean as able; on multiple sedating agents for control of agitated delirium - Repeat Phenobarbital  taper to facilitate sedation weaning and eventual extubation - CIWA with Ativan  PRN - Continue thiamine /folate - Lactulose  BID - Continue Seroquel TID PRN - S/p cyproheptadine administration  Acute Hypoxic Respiratory Failure secondary to above  Aspiration pneumonia, witnessed aspiration 5/22 Episode of mucous plugging 5/25 - Continue full vent support (4-8cc/kg IBW) - Wean FiO2 for O2 sat > 90% - Daily WUA/SBT, mental status intact - VAP bundle - Pulmonary hygiene - PAD protocol for sedation: Ketamine, Precedex , Versed , and Dilaudid  for goal RASS 0 to -1; need to slowly reduce amount of agents being used as polypharmacy likely clouding clinical picture  - Continue Unasyn for aspiration PNA coverage  Hypertriglyceridemia - worsened by propofol, lipase normal Downtrending. - Trend TG to normal  Ileus - improving, likely related to high-dose opiates and immobilization Nutrition - Continue Cortrak - Resume TF as tolerated, held in the setting of ?aspiration event - Reattempt OGT placement for abdominal decompression (?ileus), would not reattempt more than one additional time to avoid risk of OGT trauma - Limit opioid medications as able - Bowel regimen - Flexiseal in place  HTN  HLD - Hold home antihypertensives - Hold home statin  Hematuria, resolved - 2/2 traumatic Foley removal during agitation episode by patient - Continue Foley - Monitor I&Os  Physical deconditioning - PT/OT evaluations as appropriate  Best Practice: (right click and  "Reselect all SmartList Selections" daily)   Diet/type: tubefeeds DVT prophylaxis prophylactic heparin   Pressure ulcer(s): N/A GI prophylaxis: H2B Lines: N/A Foley:  Yes, and it is still needed Code Status:  full code  Critical care time:   The patient is critically ill with multiple organ system failure and requires high complexity decision making for assessment and support, frequent evaluation and titration of therapies, advanced monitoring, review of radiographic studies and interpretation of complex data.   Critical Care Time devoted to patient care services, exclusive of separately billable procedures, described in this note is 38 minutes.  Star East, PA-C [facility] 01/25/24 7:30 AM  Please see [email or web address] for pager details.  From 7A-7P if no response, please call [phone] After hours, please call ELink [phone]

## 2024-01-25 NOTE — Progress Notes (Signed)
 [facility] ADULT ICU REPLACEMENT PROTOCOL   The patient does apply for the [facility] Adult ICU Electrolyte Replacment Protocol based on the criteria listed below:   1.Exclusion criteria: TCTS, ECMO, Dialysis, and Myasthenia Gravis patients 2. Is GFR >/= 30 ml/min? Yes.    Patient's GFR today is >60 3. Is SCr </= 2? Yes.   Patient's SCr is 0.84 mg/dL 4. Did SCr increase >/= 0.5 in 24 hours? No. 5.Pt's weight >40kg  Yes.   6. Abnormal electrolyte(s): K+ 3.5  7. Electrolytes replaced per protocol 8.  Call MD STAT for K+ </= 2.5, Phos </= 1, or Mag </= 1 Physician:  Paulett Boros [facility] 01/25/2024 5:58 AM

## 2024-01-25 NOTE — Progress Notes (Signed)
 Nutrition Follow-up  DOCUMENTATION CODES:      INTERVENTION:  - Per CCM, can restart trickle tube feeds today via post-pyloric Cortrak: Vital 1.5 @ 21mL/hr - provides 720 kcals, 32g protein, and free water    - Once able to advance past trickle tube feeds, recommend:  Vital 1.5 with goal rate of 65 ml/hr.  *Recommend advancing by 10 mL Q12H + 60 ml Prosource TF 20 daily Provides 2420 kcals, 125g protein and 1191 ml H2O   - Monitor magnesium , potassium, and phosphorus for at least 3 days, MD to replete as needed, as pt is at risk for refeeding syndrome.    - If unable to advance past trickle tube feeds in the next 48 hours, would recommend recommend consideration for TPN as patient has had minimal nutrition since admission 1 week ago.   NUTRITION DIAGNOSIS:   Increased nutrient needs related to chronic illness as evidenced by estimated needs. *ongoing  GOAL:   Patient will meet greater than or equal to 90% of their needs *progressing, restart trickle tube feeds  MONITOR:   Labs, TF tolerance, Vent status  REASON FOR ASSESSMENT:   Consult Enteral/tube feeding initiation and management (trickle TF)  ASSESSMENT:   43 y.o. male history of EtOH abuse, hypertension, hepatic encephalopathy , admitted for altered mental status.  5/21 Admit; Intubated; Cortrak placed - TF initiated 5/22 TF held after patient vomited 5/24 Trickle tube feeds restarted 5/25 TF held in the AM due to vomiting; restarted in the afternoon  5/27 Patient extubated in the AM; TF held in the PM; Patient with increased agitation and delirium so subsequently re-intubated  Patient is currently intubated on ventilator support MV: 11.6 L/min Temp (24hrs), Avg:100.5 F (38.1 C), Min:96.8 F (36 C), Max:103 F (39.4 C)  Patient extubated but then re-intubated yesterday.  Per abdominal xray yesterday, Cortrak remains post-pyloric in the distal descending duodenum.  Initial plan for OGT for  abdominal decompression due to questionable ileus but now plan to hold off on tube placement.    Per discussion with CCM PA-C, can restart trickle tube feeds today. Discussed with RN.   Patient also remains on D10. In addition to trickle tube feeds, patient will be receiving 965 kcals over 24 hours.    Admit weight: 265# Current weight: 287# I&O's: +11.5L since admit   Medications reviewed and include: 1mg  folic acid , 100mg  thiamine  daily, Colace. Miralax, Lactulose  BID, D10@ 76mL/hr (provides 245 kcals over 24 hours) Precedex  Ketamine Versed  Dilaudid     Labs reviewed:  K+ 3.4 Phosphorus 4.9 Magnesium  2.5   Diet Order:   Diet Order             Diet NPO time specified  Diet effective now                   EDUCATION NEEDS:  Not appropriate for education at this time  Skin:  Skin Assessment: Reviewed RN Assessment  Last BM:  5/26 - rectal tube  Height:  Ht Readings from Last 1 Encounters:  01/21/24 5\' 10"  (1.778 m)   Weight:  Wt Readings from Last 1 Encounters:  01/25/24 131 kg   Ideal Body Weight:  75.45 kg  BMI:  Body mass index is 41.44 kg/m.  Estimated Nutritional Needs:  Kcal:  2250-2650 kcals Protein:  120-150 grams Fluid:  >/= 2.2L    Scheryl Cushing RD, LDN Contact via Secure Chat.

## 2024-01-25 NOTE — Progress Notes (Signed)
 OT Cancellation Note  Patient Details Name: DILLARD PASCAL MRN: [id number] DOB: 29-Sep-1980   Cancelled Treatment:    Reason Eval/Treat Not Completed: Medical issues which prohibited therapy Patient was intubated with respiratory failure on 5/27 around 6pm. Per therapy protocol, OT to hold for 24 hrs and check back after intubation.  Wynette Heckler, MS Acute Rehabilitation Department Office# [phone]  01/25/2024, 7:02 AM

## 2024-01-25 NOTE — Progress Notes (Signed)
 Hypoglycemic Event  CBG: 54 @ 1926     Treatment: D50 50 mL (25 gm)  Symptoms: Sweaty, Shaky, and Nervous/irritable  Follow-up CBG: Time:1941 CBG Result:115   Possible Reasons for Event: Inadequate meal intake  Comments/MD notified:Elink Nurse Rosealee Concha, RN notified, and Elink Dr. Piedad Brewer. D10 rate changed to 50 mL/hr from 30 mL/hr per new orders    Teddi Favors, RN

## 2024-01-25 NOTE — Progress Notes (Signed)
 PT Cancellation Note  Patient Details Name: Juan Clarke MRN: [id number] DOB: 1981/08/24   Cancelled Treatment:    Reason Eval/Treat Not Completed: Medical issues which prohibited therapy Patient now on the vent.  Will follow. Abelina Hoes PT Acute Rehabilitation Services Office [phone]   Dareen Ebbing 01/25/2024, 8:01 AM

## 2024-01-25 NOTE — Progress Notes (Addendum)
 eLink Physician-Brief Progress Note Patient Name: Juan Clarke DOB: 1980/10/07 MRN: [id number]   Date of Service  01/25/2024  HPI/Events of Note  Patient very restless/agitated on the vent   just received versed  bolus plus she is trying to increase precedex  gtt but it is only able to be titrated q15 mins and she is asking if we can change frequency?   Also, Ketamine was stopped on dayshift, still on [facility] and she is asking if we can restart gtt?   Also, CBG = 54  Camera evaluation: Discussed with RN.  On versed  at 5, precedex  at 0.7, dilaudid  ar 2. In synchrony with Vent, stable vitals. Agitation on and off.   eICU Interventions  S/p d50 received already. For now titrate up current sedation's.  Increased D10 from 30 to 50 ml/hr. Keep CBG < 180, over 80.  Precedex  titration adjusted      Intervention Category Intermediate Interventions: Other: (agitation)  Rexann Catalan 01/25/2024, 7:46 PM  01:04 Bedside RN asking to renew Precedex  oreder. Re ordered

## 2024-01-25 NOTE — Plan of Care (Signed)
  Problem: Metabolic: Goal: Ability to maintain appropriate glucose levels will improve Outcome: Progressing   Problem: Skin Integrity: Goal: Risk for impaired skin integrity will decrease Outcome: Progressing   Problem: Tissue Perfusion: Goal: Adequacy of tissue perfusion will improve Outcome: Progressing   Problem: Clinical Measurements: Goal: Cardiovascular complication will be avoided Outcome: Progressing   Problem: Coping: Goal: Level of anxiety will decrease Outcome: Progressing   Problem: Elimination: Goal: Will not experience complications related to bowel motility Outcome: Progressing   Problem: Pain Managment: Goal: General experience of comfort will improve and/or be controlled Outcome: Progressing   Problem: Safety: Goal: Ability to remain free from injury will improve Outcome: Not Progressing   Problem: Safety: Goal: Non-violent Restraint(s) Outcome: Not Progressing

## 2024-01-26 ENCOUNTER — Inpatient Hospital Stay (HOSPITAL_COMMUNITY)

## 2024-01-26 DIAGNOSIS — G9081 Serotonin syndrome: Secondary | ICD-10-CM | POA: Diagnosis not present

## 2024-01-26 DIAGNOSIS — R6510 Systemic inflammatory response syndrome (SIRS) of non-infectious origin without acute organ dysfunction: Secondary | ICD-10-CM | POA: Diagnosis not present

## 2024-01-26 DIAGNOSIS — J9601 Acute respiratory failure with hypoxia: Secondary | ICD-10-CM | POA: Diagnosis not present

## 2024-01-26 DIAGNOSIS — F10931 Alcohol use, unspecified with withdrawal delirium: Secondary | ICD-10-CM | POA: Diagnosis not present

## 2024-01-26 LAB — FERRITIN: Ferritin: 396 ng/mL — ABNORMAL HIGH (ref 24–336)

## 2024-01-26 LAB — CBC
HCT: 27.7 % — ABNORMAL LOW (ref 39.0–52.0)
Hemoglobin: 8.6 g/dL — ABNORMAL LOW (ref 13.0–17.0)
MCH: 30.3 pg (ref 26.0–34.0)
MCHC: 31 g/dL (ref 30.0–36.0)
MCV: 97.5 fL (ref 80.0–100.0)
Platelets: 236 10*3/uL (ref 150–400)
RBC: 2.84 MIL/uL — ABNORMAL LOW (ref 4.22–5.81)
RDW: 13.2 % (ref 11.5–15.5)
WBC: 6.6 10*3/uL (ref 4.0–10.5)
nRBC: 0 % (ref 0.0–0.2)

## 2024-01-26 LAB — GLUCOSE, CAPILLARY
Glucose-Capillary: 102 mg/dL — ABNORMAL HIGH (ref 70–99)
Glucose-Capillary: 109 mg/dL — ABNORMAL HIGH (ref 70–99)
Glucose-Capillary: 77 mg/dL (ref 70–99)
Glucose-Capillary: 87 mg/dL (ref 70–99)
Glucose-Capillary: 100 mg/dL — ABNORMAL HIGH (ref 70–99)
Glucose-Capillary: 90 mg/dL (ref 70–99)

## 2024-01-26 LAB — BASIC METABOLIC PANEL WITH GFR
Anion gap: 8 (ref 5–15)
BUN: 23 mg/dL — ABNORMAL HIGH (ref 6–20)
CO2: 28 mmol/L (ref 22–32)
Calcium: 8.3 mg/dL — ABNORMAL LOW (ref 8.9–10.3)
Chloride: 100 mmol/L (ref 98–111)
Creatinine, Ser: 1.24 mg/dL (ref 0.61–1.24)
GFR, Estimated: >60 mL/min (ref >60)
Glucose, Bld: 151 mg/dL — ABNORMAL HIGH (ref 70–99)
Potassium: 3.3 mmol/L — ABNORMAL LOW (ref 3.5–5.1)
Sodium: 136 mmol/L (ref 135–145)

## 2024-01-26 LAB — MAGNESIUM
Magnesium: 2.5 mg/dL — ABNORMAL HIGH (ref 1.7–2.4)
Magnesium: 2.4 mg/dL (ref 1.7–2.4)

## 2024-01-26 LAB — PHOSPHORUS
Phosphorus: 5.7 mg/dL — ABNORMAL HIGH (ref 2.5–4.6)
Phosphorus: 3.7 mg/dL (ref 2.5–4.6)

## 2024-01-26 LAB — C-REACTIVE PROTEIN: CRP: 23.4 mg/dL — ABNORMAL HIGH (ref <1.0)

## 2024-01-26 LAB — SEDIMENTATION RATE: Sed Rate: 109 mm/h — ABNORMAL HIGH (ref 0–16)

## 2024-01-26 LAB — CK: Total CK: 4538 U/L — ABNORMAL HIGH (ref 49–397)

## 2024-01-26 MED ORDER — POTASSIUM CHLORIDE 20 MEQ PO PACK
20.0000 meq | PACK | ORAL | Status: AC
  Administered 2024-01-26 (×2): 20 meq
  Filled 2024-01-26: qty 1

## 2024-01-26 MED ORDER — HYDROMORPHONE HCL 1 MG/ML IJ SOLN
1.0000 mg | INTRAMUSCULAR | Status: DC | PRN
  Administered 2024-01-26 – 2024-01-30 (×6): 1 mg via INTRAVENOUS
  Filled 2024-01-26 (×6): qty 1

## 2024-01-26 MED ORDER — DEXMEDETOMIDINE HCL IN NACL 200 MCG/50ML IV SOLN: 0.0000 ug/kg/h | INTRAVENOUS | Status: DC

## 2024-01-26 MED ORDER — ACETAMINOPHEN 160 MG/5ML PO SOLN
650.0000 mg | Freq: Four times a day (QID) | ORAL | Status: DC | PRN
  Administered 2024-01-26 – 2024-01-27 (×3): 650 mg
  Filled 2024-01-26 (×3): qty 20.3

## 2024-01-26 MED ORDER — DEXMEDETOMIDINE HCL IN NACL 400 MCG/100ML IV SOLN
0.0000 ug/kg/h | INTRAVENOUS | Status: DC
  Administered 2024-01-26 (×2): 0.7 ug/kg/h via INTRAVENOUS
  Administered 2024-01-26: 0.8 ug/kg/h via INTRAVENOUS
  Administered 2024-01-26: 0.7 ug/kg/h via INTRAVENOUS
  Administered 2024-01-26: 1.4 ug/kg/h via INTRAVENOUS
  Administered 2024-01-27 (×2): 0.8 ug/kg/h via INTRAVENOUS
  Administered 2024-01-27 (×2): 0.5 ug/kg/h via INTRAVENOUS
  Administered 2024-01-28: 0.6 ug/kg/h via INTRAVENOUS
  Administered 2024-01-28: 0.4 ug/kg/h via INTRAVENOUS
  Administered 2024-01-28: 0.5 ug/kg/h via INTRAVENOUS
  Filled 2024-01-26 (×12): qty 100

## 2024-01-26 MED ORDER — POTASSIUM CHLORIDE 10 MEQ/50ML IV SOLN
10.0000 meq | INTRAVENOUS | Status: AC
  Administered 2024-01-26 (×4): 10 meq via INTRAVENOUS
  Filled 2024-01-26 (×4): qty 50

## 2024-01-26 MED ORDER — DEXMEDETOMIDINE HCL IN NACL 200 MCG/50ML IV SOLN
0.0000 ug/kg/h | INTRAVENOUS | Status: DC
  Administered 2024-01-26 (×3): 1.4 ug/kg/h via INTRAVENOUS
  Administered 2024-01-26: 1.3 ug/kg/h via INTRAVENOUS
  Administered 2024-01-26: 1.4 ug/kg/h via INTRAVENOUS
  Filled 2024-01-26 (×4): qty 50
  Filled 2024-01-26: qty 100

## 2024-01-26 MED ORDER — VITAL 1.5 CAL PO LIQD
1000.0000 mL | ORAL | Status: DC
  Administered 2024-01-26 – 2024-01-27 (×2): 1000 mL
  Filled 2024-01-26 (×6): qty 1000

## 2024-01-26 NOTE — Plan of Care (Signed)
  Problem: Coping: Goal: Ability to adjust to condition or change in health will improve Outcome: Progressing   Problem: Tissue Perfusion: Goal: Adequacy of tissue perfusion will improve Outcome: Progressing   Problem: Education: Goal: Knowledge of General Education information will improve Description: Including pain rating scale, medication(s)/side effects and non-pharmacologic comfort measures Outcome: Progressing   Problem: Clinical Measurements: Goal: Diagnostic test results will improve Outcome: Progressing Goal: Respiratory complications will improve Outcome: Progressing Goal: Cardiovascular complication will be avoided Outcome: Progressing   Problem: Nutritional: Goal: Maintenance of adequate nutrition will improve Outcome: Not Progressing Goal: Progress toward achieving an optimal weight will improve Outcome: Not Progressing

## 2024-01-26 NOTE — Progress Notes (Signed)
 Extra Ketamine gtt that was tubed at beginning of shift was picked up by [facility] @ current time of this note due to Ketamine gtt not infusing at this time.

## 2024-01-26 NOTE — Progress Notes (Signed)
 Bipap is PRN order; not needed at this time.  Patient is getting tube feedings through nose at this time.

## 2024-01-26 NOTE — Procedures (Signed)
 Extubation Procedure Note  Patient Details:   Name: Juan Clarke DOB: 24-May-1981 MRN: [id number]   Airway Documentation:    Vent end date: 01/26/24 Vent end time: 1420   Evaluation  O2 sats: stable throughout Complications: No apparent complications Patient did tolerate procedure well. Bilateral Breath Sounds: Clear, Diminished  Pt placed on 4L. Pt is able to speak,  Larwence Poet 01/26/2024, 2:27 PM

## 2024-01-26 NOTE — Progress Notes (Signed)
 OT Cancellation Note  Patient Details Name: SERAFINO BURCIAGA MRN: [id number] DOB: 1981-08-03   Cancelled Treatment:    Reason Eval/Treat Not Completed: Medical issues which prohibited therapy Vented this a with potential for weaning. OT will re-attempt evaluation as soon as patient medically able and schedule allows.   Magon Croson OT/L Acute Rehabilitation Department  [phone]    01/26/2024, 2:18 PM

## 2024-01-26 NOTE — Progress Notes (Signed)
 NAME:  Juan Clarke, MRN:  [id number], DOB:  04-Jul-1981, LOS: 8 ADMISSION DATE:  01/17/2024, CONSULTATION DATE:  01/18/2024 REFERRING MD: Wallis Gun - EDP, CHIEF COMPLAINT:  EtOH withdrawal   History of Present Illness:  43 y/o male with alcoholism, who presented to ED via EMS after he was found passed out on the cough.  He was able to eventually arouse but very confused and not able to effectively communicate.  In the ED patient very diaphoretic and confused.  However, he became increasingly more agitated.  He has received multiples doses of Ativan  and received 200mg  IV Phenobarbital .  However, he had to be placed on 4 point restraints.  His CIWA score was above 21.   ED labs revealed an increased LA 4.1, Ammonia level 125.  Pertinent Medical History:   Past Medical History:  Diagnosis Date   GAD (generalized anxiety disorder)    HTN (hypertension)    Insomnia    Migraine without aura    Overweight    Trigeminal neuralgia    Significant Hospital Events: Including procedures, antibiotic start and stop dates in addition to other pertinent events   5/21 - Intubated and admitted to ICU 5/22 - Worsening agitation overnight with increasing sedation requirements >>propofol dc'd due to high TGS,  ketamine added 5/22 - Vomiting around ETT, aspiration   5/23 - High-grade fever 104, ketamine titrated to 2 mg due to severe agitation 5/24 - Breakthrough sedation, resume Versed  drip, added Seroquel, enteral Ativan   5/25 - Several pushes for agitation, finally given vecuronium x 1 , left IJ CVL placed 5/25 - Episode of desaturation with mucous plugs, improved with bagging and suctioning 5/27 - Extubated. Increased agitation/delirium despite all available measures, ultimately reintubated. Febrile to 103-104F despite APAP, concerning for serotonin syndrome. Cyproheptadine given. 5/28 - Febrile to 102.63F ~1925, calm/appropriate on sedation throughout the day. Ketamine discontinued.  Interim History /  Subjective:  No significant events overnight Febrile to Tmax 102.63F ~1925 Improved mentation this AM, calm and appropriate, using clipboard to write Remains on Versed  2.5, Precedex  0.7, Dilaudid  PRN Shivering/shaking chills with temp ~100F this AM, improved with warm blankets Weaning on vent PSV 5/5 Will attempt to optimize from a sedation standpoint and extubate today 5/29  Objective:   Blood pressure (!) 102/54, pulse 64, temperature 99.6 F (37.6 C), temperature source Rectal, resp. rate 20, height 5\' 10"  (1.778 m), weight 131 kg, SpO2 95%. CVP:  [8 mmHg-21 mmHg] 13 mmHg  Vent Mode: CPAP FiO2 (%):  [45 %] 45 % Set Rate:  [20 bmp] 20 bmp Vt Set:  [580 mL] 580 mL PEEP:  [5 cmH20] 5 cmH20 Pressure Support:  [5 cmH20] 5 cmH20 Plateau Pressure:  [19 cmH20-24 cmH20] 23 cmH20   Intake/Output Summary (Last 24 hours) at 01/26/2024 0748 Last data filed at 01/26/2024 8295 Gross per 24 hour  Intake 4258.05 ml  Output 5505 ml  Net -1246.95 ml   Filed Weights   01/23/24 0459 01/24/24 0707 01/25/24 0500  Weight: 130.3 kg 133.1 kg 131 kg   Physical Examination: General: Acutely ill-appearing younger man in NAD. +Shivering, relieved by warm blankets. HEENT: [place]/AT, anicteric sclera, mildly injected R > L. Improved scleral edema from prior. PERRL, moist mucous membranes. Neuro: Awake, nodding appropriately to questions. Writing with clipboard. Responds to verbal stimuli. Following commands consistently. Moves all 4 extremities spontaneously. +Cough and +Gag  CV: Mildly tachycardic to low 100s, no m/g/r. PULM: Breathing even and unlabored on vent (PSV 5/5, FiO2 40%). Lung fields  diminished in lower fields but improved from prior. GI: Soft, nontender, mildly distended. Hypoactive bowel sounds. Extremities: Trace bilateral symmetric LE edema noted. Skin: Warm/dry, no rashes.  Assessment and Plan:   Acute Metabolic Encephalopathy  Acute ETOH withdrawal, ?Delirium Tremens  - mother reports he  has been sober for 3 months with relapse ?Serotonin syndrome although no clonus, six different agents on home med list with risk for SS (paroxetine , duloxetine , carbamazepine , trazodone, dextromethorphan, ?Vyvanse), Zofran  on hospital med list (discontinued) Hyperammonemia S/p intubation for airway protection  - Monitor for signs/symptoms of withdrawal - Precedex , leave in place for now (plan to extubate on Precedex ) - Repeat Phenobarbital  taper in place, goal to facilitate sedation weaning and extubation - CIWA with Ativan  PRN, Versed  gtt tapering to off - Continue thiamine /folate/MV - Lactulose  BID - Seroquel TID PRN, consider discontinuation - S/p cyproheptadine, trend CK  Acute Hypoxic Respiratory Failure secondary to above  Aspiration pneumonia, witnessed aspiration 5/22 Episode of mucous plugging 5/25 - Continue full vent support (4-8cc/kg IBW); still potential extubation today 5/29 if optimized from a sedation standpoint - Wean FiO2 for O2 sat > 90% - Daily WUA/SBT, mental status intact and weaning well on vent 5/29AM - VAP bundle - Pulmonary hygiene - PAD protocol for sedation: Precedex  and Versed  for goal RASS 0 to -1; Dilaudid  PRN, ketamine discontinued 5/28 - Unasyn for aspiration PNA coverage - S/p Lasix 80mg  IV x 1 with good diuresis, 4L UOP, net negative today 5/28-5/29  Hypertriglyceridemia - worsened by propofol, lipase normal Downtrending. - TG improved  Ileus - improving, likely related to high-dose opiates and immobilization Nutrition - Continue Cortrak - TF as tolerated - Hold on OGT placement, no significant gastric distention on AXR, suspect more colonic distention 2/2 lactulose /mild ileus, unlikely to be improved by OGT decompression - Limit opioid medications as able - Bowel regimen - Flexiseal in place for skin protection  HTN  HLD - Consider resumption of home antihypertensives - Hold statin for now  Hematuria, resolved - 2/2 traumatic Foley  removal during agitation episode by patient - Continue Foley  Physical deconditioning - PT/OT evaluations  Best Practice: (right click and "Reselect all SmartList Selections" daily)   Diet/type: tubefeeds DVT prophylaxis prophylactic heparin   Pressure ulcer(s): N/A GI prophylaxis: H2B Lines: Central line Foley:  Yes, and it is still needed Code Status:  full code  Critical care time:   The patient is critically ill with multiple organ system failure and requires high complexity decision making for assessment and support, frequent evaluation and titration of therapies, advanced monitoring, review of radiographic studies and interpretation of complex data.   Critical Care Time devoted to patient care services, exclusive of separately billable procedures, described in this note is 36 minutes.  Star [place], PA-C [facility] 01/26/24 7:48 AM  Please see [email or web address] for pager details.  From 7A-7P if no response, please call [phone] After hours, please call ELink [phone]

## 2024-01-26 NOTE — Plan of Care (Signed)
  Problem: Metabolic: Goal: Ability to maintain appropriate glucose levels will improve Outcome: Progressing   Problem: Nutritional: Goal: Maintenance of adequate nutrition will improve Outcome: Progressing   Problem: Skin Integrity: Goal: Risk for impaired skin integrity will decrease Outcome: Progressing   Problem: Clinical Measurements: Goal: Respiratory complications will improve Outcome: Progressing Goal: Cardiovascular complication will be avoided Outcome: Progressing   Problem: Nutrition: Goal: Adequate nutrition will be maintained Outcome: Progressing   Problem: Elimination: Goal: Will not experience complications related to bowel motility Outcome: Progressing Goal: Will not experience complications related to urinary retention Outcome: Progressing   Problem: Safety: Goal: Ability to remain free from injury will improve Outcome: Progressing   Problem: Safety: Goal: Non-violent Restraint(s) Outcome: Progressing

## 2024-01-26 NOTE — Progress Notes (Signed)
 eLink Physician-Brief Progress Note Patient Name: Juan Clarke DOB: March 10, 1981 MRN: [id number]   Date of Service  01/26/2024  HPI/Events of Note  acute agitated encephalopahty - etoh wd and serotonin syndrome - extubated on versed  gtt and precedex    Persistent low-grade fevers  eICU Interventions  Renew acetaminophen  per tube     Intervention Category Minor Interventions: Routine modifications to care plan (e.g. PRN medications for pain, fever)  Juan Clarke 01/26/2024, 8:08 PM

## 2024-01-26 NOTE — Progress Notes (Signed)
 Updated patient's wife, Mansfield Seip on patient status. Per patient's wife there are lawyers involved in custody of their children at this time. This nurse encouraged the wife to avoid discussions of this at this time with the patient as the patient can become increasingly agitated with things outside of his control at this time and low stimulation would be best for his health. Patient's wife agreed to this.

## 2024-01-26 NOTE — Progress Notes (Signed)
 Brief Nutrition Support Update Note  Patient remains intubated. Trickle TF's restarted yesterday and patient tolerating. Discussed with CCM PA-C, can begin advancing tube feeds today. Will plan for slow advancement as patient with minimal nutrition since admit 8 days ago. Patient also continues to receive D10, now increased to 78mL/hr which provides 408 kcals over 24 hours. Patient hopefully to be extubated today, but plan to continue tube feeds until patient able to eat/drink and consume adequate oral intake.  Interventions: - Begin advancing tube feeds today: Vital 1.5 with goal rate of 65 ml/hr.  *Increase to 8mL/hr and then begin advanncing by 10 mL Q12H 60 ml Prosource TF 20 daily Provides 2420 kcals, 125g protein and 1191 ml H2O   - Monitor magnesium , potassium, and phosphorus for at least 3 days, MD to replete as needed, as pt is at risk for refeeding syndrome.   - Now that advancing tube feeds, recommend discontinuing D10.   Scheryl Cushing RD, LDN Contact via [employer].

## 2024-01-27 DIAGNOSIS — G9081 Serotonin syndrome: Secondary | ICD-10-CM | POA: Diagnosis not present

## 2024-01-27 DIAGNOSIS — R6510 Systemic inflammatory response syndrome (SIRS) of non-infectious origin without acute organ dysfunction: Secondary | ICD-10-CM | POA: Diagnosis not present

## 2024-01-27 DIAGNOSIS — F10931 Alcohol use, unspecified with withdrawal delirium: Secondary | ICD-10-CM | POA: Diagnosis not present

## 2024-01-27 DIAGNOSIS — J9601 Acute respiratory failure with hypoxia: Secondary | ICD-10-CM | POA: Diagnosis not present

## 2024-01-27 LAB — BASIC METABOLIC PANEL WITH GFR
Anion gap: 6 (ref 5–15)
BUN: 16 mg/dL (ref 6–20)
CO2: 25 mmol/L (ref 22–32)
Calcium: 8.3 mg/dL — ABNORMAL LOW (ref 8.9–10.3)
Chloride: 106 mmol/L (ref 98–111)
Creatinine, Ser: 0.88 mg/dL (ref 0.61–1.24)
GFR, Estimated: >60 mL/min (ref >60)
Glucose, Bld: 122 mg/dL — ABNORMAL HIGH (ref 70–99)
Potassium: 3.6 mmol/L (ref 3.5–5.1)
Sodium: 137 mmol/L (ref 135–145)

## 2024-01-27 LAB — GLUCOSE, CAPILLARY
Glucose-Capillary: 92 mg/dL (ref 70–99)
Glucose-Capillary: 94 mg/dL (ref 70–99)
Glucose-Capillary: 95 mg/dL (ref 70–99)
Glucose-Capillary: 90 mg/dL (ref 70–99)

## 2024-01-27 LAB — HEPATIC FUNCTION PANEL
ALT: 122 U/L — ABNORMAL HIGH (ref 0–44)
AST: 85 U/L — ABNORMAL HIGH (ref 15–41)
Albumin: 2.7 g/dL — ABNORMAL LOW (ref 3.5–5.0)
Alkaline Phosphatase: 204 U/L — ABNORMAL HIGH (ref 38–126)
Bilirubin, Direct: 0.2 mg/dL (ref 0.0–0.2)
Indirect Bilirubin: 0.4 mg/dL (ref 0.3–0.9)
Total Bilirubin: 0.6 mg/dL (ref 0.0–1.2)
Total Protein: 6.6 g/dL (ref 6.5–8.1)

## 2024-01-27 LAB — CK: Total CK: 419 U/L — ABNORMAL HIGH (ref 49–397)

## 2024-01-27 LAB — CULTURE, BLOOD (ROUTINE X 2)
Culture: NO GROWTH
Culture: NO GROWTH

## 2024-01-27 LAB — ANCA PROFILE
Anti-MPO Antibodies: <0.2 U (ref 0.0–0.9)
Anti-PR3 Antibodies: <0.2 U (ref 0.0–0.9)
Atypical P-ANCA titer: <1:20 {titer}
C-ANCA: <1:20 {titer}
P-ANCA: <1:20 {titer}

## 2024-01-27 LAB — CBC
HCT: 31.8 % — ABNORMAL LOW (ref 39.0–52.0)
Hemoglobin: 10.2 g/dL — ABNORMAL LOW (ref 13.0–17.0)
MCH: 29.9 pg (ref 26.0–34.0)
MCHC: 32.1 g/dL (ref 30.0–36.0)
MCV: 93.3 fL (ref 80.0–100.0)
Platelets: 318 10*3/uL (ref 150–400)
RBC: 3.41 MIL/uL — ABNORMAL LOW (ref 4.22–5.81)
RDW: 12.9 % (ref 11.5–15.5)
WBC: 7.9 10*3/uL (ref 4.0–10.5)
nRBC: 0 % (ref 0.0–0.2)

## 2024-01-27 LAB — ANTI-DNA ANTIBODY, DOUBLE-STRANDED: ds DNA Ab: <1 [IU]/mL (ref 0–9)

## 2024-01-27 LAB — RHEUMATOID FACTOR: Rheumatoid fact SerPl-aCnc: 15.3 [IU]/mL — ABNORMAL HIGH (ref <14.0)

## 2024-01-27 LAB — PHOSPHORUS: Phosphorus: 3.4 mg/dL (ref 2.5–4.6)

## 2024-01-27 LAB — CYCLIC CITRUL PEPTIDE ANTIBODY, IGG/IGA: CCP Antibodies IgG/IgA: 10 U (ref 0–19)

## 2024-01-27 LAB — MAGNESIUM: Magnesium: 2.4 mg/dL (ref 1.7–2.4)

## 2024-01-27 LAB — C4 COMPLEMENT: Complement C4, Body Fluid: 26 mg/dL (ref 12–38)

## 2024-01-27 LAB — C3 COMPLEMENT: C3 Complement: 292 mg/dL — ABNORMAL HIGH (ref 82–167)

## 2024-01-27 MED ORDER — QUETIAPINE FUMARATE 100 MG PO TABS
100.0000 mg | ORAL_TABLET | Freq: Two times a day (BID) | ORAL | Status: AC
  Administered 2024-01-28 (×2): 100 mg via ORAL
  Filled 2024-01-27 (×2): qty 1

## 2024-01-27 MED ORDER — POTASSIUM CHLORIDE 20 MEQ PO PACK
40.0000 meq | PACK | Freq: Once | ORAL | Status: AC
  Administered 2024-01-27: 40 meq
  Filled 2024-01-27: qty 2

## 2024-01-27 MED ORDER — CYPROHEPTADINE HCL 4 MG PO TABS: 2.0000 mg | ORAL_TABLET | ORAL | Status: DC | PRN

## 2024-01-27 MED ORDER — PHENOBARBITAL 32.4 MG PO TABS
32.4000 mg | ORAL_TABLET | Freq: Three times a day (TID) | ORAL | Status: AC
  Administered 2024-01-28: 32.4 mg via ORAL
  Filled 2024-01-27: qty 1

## 2024-01-27 MED ORDER — LACTULOSE 10 GM/15ML PO SOLN
20.0000 g | Freq: Two times a day (BID) | ORAL | Status: DC
  Administered 2024-01-28 (×2): 20 g via ORAL
  Filled 2024-01-27 (×2): qty 30

## 2024-01-27 MED ORDER — ACETAMINOPHEN 160 MG/5ML PO SOLN
650.0000 mg | Freq: Four times a day (QID) | ORAL | Status: AC | PRN
  Administered 2024-01-28: 650 mg via ORAL
  Filled 2024-01-27: qty 20.3

## 2024-01-27 MED ORDER — IBUPROFEN 800 MG PO TABS: 800.0000 mg | ORAL_TABLET | Freq: Three times a day (TID) | ORAL | Status: DC | PRN

## 2024-01-27 MED ORDER — POLYETHYLENE GLYCOL 3350 17 G PO PACK: 17.0000 g | PACK | Freq: Every day | ORAL | Status: DC

## 2024-01-27 MED ORDER — QUETIAPINE FUMARATE 100 MG PO TABS
100.0000 mg | ORAL_TABLET | Freq: Two times a day (BID) | ORAL | Status: DC
  Administered 2024-01-27: 100 mg
  Filled 2024-01-27: qty 1

## 2024-01-27 MED ORDER — FAMOTIDINE 20 MG PO TABS
20.0000 mg | ORAL_TABLET | Freq: Two times a day (BID) | ORAL | Status: DC
  Administered 2024-01-28 – 2024-01-30 (×5): 20 mg via ORAL
  Filled 2024-01-27 (×5): qty 1

## 2024-01-27 MED ORDER — LORAZEPAM 1 MG PO TABS
2.0000 mg | ORAL_TABLET | Freq: Four times a day (QID) | ORAL | Status: DC
  Administered 2024-01-28 (×4): 2 mg via ORAL
  Filled 2024-01-27 (×5): qty 2

## 2024-01-27 MED ORDER — DOCUSATE SODIUM 100 MG PO CAPS
100.0000 mg | ORAL_CAPSULE | Freq: Two times a day (BID) | ORAL | Status: DC
  Administered 2024-01-28: 100 mg via ORAL
  Filled 2024-01-27 (×4): qty 1

## 2024-01-27 NOTE — Plan of Care (Signed)
   Problem: Education: Goal: Ability to describe self-care measures that may prevent or decrease complications (Diabetes Survival Skills Education) will improve Outcome: Progressing Goal: Individualized Educational Video(s) Outcome: Progressing

## 2024-01-27 NOTE — Evaluation (Signed)
 Physical Therapy Evaluation Patient Details Name: Juan Clarke MRN: [id number] DOB: 05-11-1981 Today's Date: 01/27/2024  History of Present Illness  Juan Clarke is a  43 y/o male with PMH for Alcoholism, ETOH abuse, Severe major depression, Trigeminal Neuralgia, HTN, Gout and Hypothyroidism who presented to ED via EMS after he was found passed out on the couch.  He was able to eventually arouse but very confused and not able to effectively communicate.  In the ED patient very diaphoretic and confused.  However, he became increasingly more agitated.  He has received multiples doses of Ativan  and received 200mg  IV Phenobarbital .  However, he had to be placed on 4 point restraints.  His CIWA score was above 21.    ED labs revealed an increased LA 4.1, Ammonia level 125. Admitted with dx Acute ETOH withdraw, ARF, DT's, lactic acidosis.  Pt intubated for several days and extubated 01/26/24  Clinical Impression  PTA, patient was living in a half way house for alcohol rehab but since relapse patient reports his ability to return has been terminated. 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 Currently, patient weaned from vent yesterday and now on O2 via [place] 3 ltrs with prolonged bed rest 10+ days and up for 1st time this session. Patient presents with deficits outlined below (see PT Problem List for details) most significantly +2 support due to multiple lines/leads/O2 dep and rectal tube management), decreased activity tolerance and balance impacting functional mobility and BADL's. Anticipate with continue Acute hospital level PT services patient should continue to progress to allow for discharge to ETOH abuse program if able to mobilize and perform at mod I level. Will continue to follow acutely.       If plan is discharge home, recommend the following: A lot of help with walking and/or transfers;A little help with bathing/dressing/bathroom;Assistance with cooking/housework;Assist for  transportation;Help with stairs or ramp for entrance   Can travel by private vehicle        Equipment Recommendations Rolling walker (2 wheels) (dependent on acute stay progress)  Recommendations for Other Services       Functional Status Assessment Patient has had a recent decline in their functional status and demonstrates the ability to make significant improvements in function in a reasonable and predictable amount of time.     Precautions / Restrictions Precautions Precautions: Fall;Other (comment) Precaution/Restrictions Comments: CVC triple lumen, Coretrak, rectal tube Restrictions Weight Bearing Restrictions Per Provider Order: No Other Position/Activity Restrictions: watch SpO2      Mobility  Bed Mobility Overal bed mobility: Needs Assistance Bed Mobility: Supine to Sit     Supine to sit: +2 for physical assistance, +2 for safety/equipment, HOB elevated, Used rails, Min assist     General bed mobility comments: multiple lines, leads and rectal tube safety needs vs physical deficits    Transfers Overall transfer level: Needs assistance Equipment used: Rolling walker (2 wheels) Transfers: Sit to/from Stand Sit to Stand: Min assist, +2 physical assistance, +2 safety/equipment, From elevated surface           General transfer comment: Steady assist with cues for LE management and use of UEs to self assist    Ambulation/Gait Ambulation/Gait assistance: Min assist, +2 physical assistance, +2 safety/equipment Gait Distance (Feet): 150 Feet Assistive device: Rolling walker (2 wheels) Gait Pattern/deviations: Step-to pattern, Step-through pattern, Decreased step length - right, Decreased step length - left, Shuffle, Trunk flexed Gait velocity: decr     General Gait Details: Steady assist and increased time  with cues for posture and position from [employer]            Wheelchair Mobility     Tilt Bed    Modified Rankin (Stroke Patients Only)        Balance Overall balance assessment: Needs assistance Sitting-balance support: No upper extremity supported, Feet supported Sitting balance-Leahy Scale: Fair     Standing balance support: Bilateral upper extremity supported Standing balance-Leahy Scale: Poor                               Pertinent Vitals/Pain Pain Assessment Pain Assessment: Faces Faces Pain Scale: Hurts a little bit Pain Location: "stiff and slightly all over" Pain Descriptors / Indicators: Sore Pain Intervention(s): Limited activity within patient's tolerance, Monitored during session    Home Living Family/patient expects to be discharged to:: Unsure                   Additional Comments: patient reports he was evicted from halfway house due to relapse of ETOH abuse    Prior Function Prior Level of Function : Independent/Modified Independent             Mobility Comments: 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 ADLs Comments: independent     Extremity/Trunk Assessment   Upper Extremity Assessment Upper Extremity Assessment: Overall WFL for tasks assessed    Lower Extremity Assessment Lower Extremity Assessment: Generalized weakness    Cervical / Trunk Assessment Cervical / Trunk Assessment: Normal  Communication   Communication Communication: No apparent difficulties    Cognition Arousal: Alert Behavior During Therapy: WFL for tasks assessed/performed   PT - Cognitive impairments: No apparent impairments                         Following commands: Intact       Cueing Cueing Techniques: Verbal cues     General Comments General comments (skin integrity, edema, etc.): SpO2 on 3 ltrs via [place] 98-100% with cues for breathing integration    Exercises     Assessment/Plan    PT Assessment Patient needs continued PT services  PT Problem List Decreased strength;Decreased range of motion;Decreased activity tolerance;Decreased  balance;Decreased mobility;Decreased coordination;Decreased knowledge of use of DME;Obesity       PT Treatment Interventions DME instruction;Gait training;Stair training;Functional mobility training;Therapeutic activities;Therapeutic exercise;Balance training;Patient/family education    PT Goals (Current goals can be found in the Care Plan section)  Acute Rehab PT Goals Patient Stated Goal: Regain IND PT Goal Formulation: With patient Time For Goal Achievement: 02/09/24 Potential to Achieve Goals: Good    Frequency Min 3X/week     Co-evaluation PT/OT/SLP Co-Evaluation/Treatment: Yes Reason for Co-Treatment: Complexity of the patient's impairments (multi-system involvement);For patient/therapist safety;To address functional/ADL transfers PT goals addressed during session: Mobility/safety with mobility;Balance OT goals addressed during session: ADL's and self-care;Proper use of Adaptive equipment and DME       AM-PAC PT "6 Clicks" Mobility  Outcome Measure Help needed turning from your back to your side while in a flat bed without using bedrails?: A Lot Help needed moving from lying on your back to sitting on the side of a flat bed without using bedrails?: A Lot Help needed moving to and from a bed to a chair (including a wheelchair)?: A Lot Help needed standing up from a chair using your arms (e.g., wheelchair or bedside chair)?: A Lot  Help needed to walk in hospital room?: A Lot Help needed climbing 3-5 steps with a railing? : A Lot 6 Click Score: 12    End of Session Equipment Utilized During Treatment: Gait belt;Oxygen Activity Tolerance: Patient tolerated treatment well Patient left: in chair;with call bell/phone within reach;with chair alarm set;with nursing/sitter in room Nurse Communication: Mobility status PT Visit Diagnosis: Muscle weakness (generalized) (M62.81);Difficulty in walking, not elsewhere classified (R26.2);Unsteadiness on feet (R26.81)    Time:  1610-9604 PT Time Calculation (min) (ACUTE ONLY): 43 min   Charges:   PT Evaluation $PT Eval Moderate Complexity: 1 Mod PT Treatments $Gait Training: 8-22 mins PT General Charges $$ ACUTE PT VISIT: 1 Visit         Thedora Finlay PT Acute Rehabilitation Services Pager [phone] Office [phone]   Aiken Withem 01/27/2024, 12:40 PM

## 2024-01-27 NOTE — TOC Progression Note (Signed)
 Transition of Care [facility]) - Progression Note    Patient Details  Name: Juan Clarke MRN: [id number] Date of Birth: 12/24/1980  [facility] CM/SW Contact  Tessie Fila, RN Phone Number: 01/27/2024, 4:03 PM  Clinical Narrative:    NCM received consult for [facility] referral. NCM faxed referral to [facility] [phone]. Awaiting a response. TOC continuing to follow.     Barriers to Discharge: Continued Medical Work up  Expected Discharge Plan and Services                                               Social Determinants of Health (SDOH) Interventions SDOH Screenings   Food Insecurity: Patient Unable To Answer (01/18/2024)  Housing: Patient Unable To Answer (01/18/2024)  Transportation Needs: Patient Unable To Answer (01/18/2024)  Utilities: Patient Unable To Answer (01/18/2024)  Social Connections: Unknown (01/11/2022)   Received from [facility]  Tobacco Use: Low Risk  (01/17/2024)    Readmission Risk Interventions    01/20/2024   12:44 PM 01/20/2024    9:59 AM 01/18/2024    3:19 PM  Readmission Risk Prevention Plan  Transportation Screening Complete Complete Complete  PCP or Specialist Appt within 5-7 Days   Complete  PCP or Specialist Appt within 3-5 Days Complete Complete   Home Care Screening   Complete  Medication Review (RN CM)   Complete  HRI or Home Care Consult Complete    Social Work Consult for Recovery Care Planning/Counseling Complete    Palliative Care Screening  Complete   Medication Review Oceanographer) Complete

## 2024-01-27 NOTE — Progress Notes (Addendum)
 NAME:  BARRETT GOLDIE, MRN:  [id number], DOB:  1981/02/02, LOS: 9 ADMISSION DATE:  01/17/2024, CONSULTATION DATE:  01/18/2024 REFERRING MD: Wallis Gun - EDP, CHIEF COMPLAINT:  EtOH withdrawal   History of Present Illness:  43 y/o male with alcoholism, who presented to ED via EMS after he was found passed out on the cough.  He was able to eventually arouse but very confused and not able to effectively communicate.  In the ED patient very diaphoretic and confused.  However, he became increasingly more agitated.  He has received multiples doses of Ativan  and received 200mg  IV Phenobarbital .  However, he had to be placed on 4 point restraints.  His CIWA score was above 21. ED labs revealed an increased LA 4.1, Ammonia level 125.  Pertinent Medical History:   Past Medical History:  Diagnosis Date   GAD (generalized anxiety disorder)    HTN (hypertension)    Insomnia    Migraine without aura    Overweight    Trigeminal neuralgia    Significant Hospital Events: Including procedures, antibiotic start and stop dates in addition to other pertinent events   5/21 - Intubated and admitted to ICU 5/22 - Worsening agitation overnight with increasing sedation requirements >>propofol dc'd due to high TGS,  ketamine added 5/22 - Vomiting around ETT, aspiration   5/23 - High-grade fever 104, ketamine titrated to 2 mg due to severe agitation 5/24 - Breakthrough sedation, resume Versed  drip, added Seroquel, enteral Ativan   5/25 - Several pushes for agitation, finally given vecuronium x 1 , left IJ CVL placed 5/25 - Episode of desaturation with mucous plugs, improved with bagging and suctioning 5/27 - Extubated. Increased agitation/delirium despite all available measures, ultimately reintubated. Febrile to 103-104F despite APAP, concerning for serotonin syndrome. Cyproheptadine given. 5/28 - Febrile to 102.562F ~1925, calm/appropriate on sedation throughout the day. Ketamine discontinued. Phenobarbital  taper  re-dosed. 5/29 - Remains on Precedex , Versed  gtt. Dilaudid /Ketamine discontinued. Extubated to 4LNC, tolerated well. Ongoing intermittent fevers to 101.562F. Improved mental status.  Interim History / Subjective:  No significant events overnight, slept well Extubated 5/29, tolerated well Mental status much improved this morning, alert/calm/cooperative C/o bladder/abdominal pressure, requests catheter removal Feels he can drink more and try to eat today Can remove Cortrak if tolerating PO Weaning Precedex  as able PT/OT consult  Objective:   Blood pressure 118/64, pulse 89, temperature (!) 101.2 F (38.4 C), temperature source Rectal, resp. rate 17, height 5\' 10"  (1.778 m), weight 109.1 kg, SpO2 98%. CVP:  [16 mmHg-18 mmHg] 16 mmHg  Vent Mode: CPAP;PSV FiO2 (%):  [40 %-45 %] 40 % PEEP:  [5 cmH20] 5 cmH20 Pressure Support:  [5 cmH20] 5 cmH20   Intake/Output Summary (Last 24 hours) at 01/27/2024 0725 Last data filed at 01/27/2024 0636 Gross per 24 hour  Intake 2426.79 ml  Output 3445 ml  Net -1018.21 ml   Filed Weights   01/24/24 0707 01/25/24 0500 01/27/24 0500  Weight: 133.1 kg 131 kg 109.1 kg   Physical Examination: General: Acutely ill-appearing younger man in NAD. Pleasant and conversant. HEENT: [place]/AT, anicteric sclera, PERRL, dry mucous membranes. Cortrak in place. Neuro: Awake, oriented x 4. Responds to verbal stimuli. Following commands consistently. Moves all 4 extremities spontaneously.  CV: RRR, no m/g/r. PULM: Breathing even and unlabored on 3LNC. Lung fields diminished at bases, improved from prior. GI: Soft, nontender, mild distention. Hypoactive bowel sounds. Extremities: No LE edema noted. Skin: Warm/dry, no rashes.  Assessment and Plan:   Acute Metabolic Encephalopathy  Acute ETOH withdrawal, ?Delirium Tremens  - mother reports he has been sober for 3 months with relapse ?Serotonin syndrome although no clonus, six different agents on home med list with risk  for SS (paroxetine , duloxetine , carbamazepine , trazodone, dextromethorphan, ?Vyvanse), Zofran  on hospital med list (discontinued) Hyperammonemia S/p intubation for airway protection  - Monitor for signs/symptoms of withdrawal - Precedex , weaning as able - Repeat Phenobarbital  taper in place, goal to further facilitate sedation weaning - CIWA with Ativan  PRN + Versed  gtt discontinued - Continue thiamine /folate/MV - Lactulose  BID, reduce as indicated (titrate to 2-3 BM/day) - Seroquel TID PRN, consider discontinuation as agitation/delirium has improved significantly - Consider clonidine  to help with Precedex  weaning, if needeed - S/p cyproheptadine - CK normalizing  Acute Hypoxic Respiratory Failure secondary to above  Aspiration pneumonia, witnessed aspiration 5/22 Episode of mucous plugging 5/25 - Extubated 5/29 to 4LNC - Supplemental O2 support for SpO2 > 90% - Pulmonary hygiene - PAD protocol for sedation: Precedex  for goal RASS 0 to -1; Dilaudid  PRN - Unasyn for aspiration coverage - Monitor I&Os, diurese as needed  Elevated ESR/CRP - Unclear etiology at present, AI panel sent in the setting of FUO + elevated CK - F/u pending studies  Hypertriglyceridemia - worsened by propofol, lipase normal Downtrending. -  TG improved  Ileus - improving, likely related to high-dose opiates and immobilization Nutrition - Continue Cortrak until tolerating PO, once taking solids can discontinue - D/c TF once taking adequate PO - Limit opioid medications as able - Bowel regimen - Flexiseal to be removed  HTN  HLD - Resume home antihypertensives, statin  Hematuria, resolved - 2/2 traumatic Foley removal during agitation episode by patient - D/c Foley today, 5/30  Physical deconditioning - PT/OT  Best Practice: (right click and "Reselect all SmartList Selections" daily)   Diet/type: tubefeeds, ADAT to Regular DVT prophylaxis prophylactic heparin   Pressure ulcer(s): N/A GI  prophylaxis: H2B Lines: Central line Foley:  removal ordered  Code Status:  full code  Critical care time:   The patient is critically ill with multiple organ system failure and requires high complexity decision making for assessment and support, frequent evaluation and titration of therapies, advanced monitoring, review of radiographic studies and interpretation of complex data.   Critical Care Time devoted to patient care services, exclusive of separately billable procedures, described in this note is 34 minutes.  Star East, PA-C [facility] 01/27/24 7:25 AM  Please see [email or web address] for pager details.  From 7A-7P if no response, please call [phone] After hours, please call ELink [phone]

## 2024-01-27 NOTE — Progress Notes (Signed)
 [facility] ADULT ICU REPLACEMENT PROTOCOL   The patient does apply for the [facility] Adult ICU Electrolyte Replacment Protocol based on the criteria listed below:   1.Exclusion criteria: TCTS, ECMO, Dialysis, and Myasthenia Gravis patients 2. Is GFR >/= 30 ml/min? Yes.    Patient's GFR today is >60 3. Is SCr </= 2? Yes.   Patient's SCr is 0.88 mg/dL 4. Did SCr increase >/= 0.5 in 24 hours? No. 5.Pt's weight >40kg  Yes.   6. Abnormal electrolyte(s):   K 3.6  7. Electrolytes replaced per protocol 8.  Call MD STAT for K+ </= 2.5, Phos </= 1, or Mag </= 1 Physician:  A. Paliwal  Devine Dant R Ramiyah Mcclenahan 01/27/2024 6:03 AM

## 2024-01-27 NOTE — Evaluation (Addendum)
 Occupational Therapy Evaluation Patient Details Name: Juan Clarke MRN: [id number] DOB: [birth year] Today's Date: 01/27/2024   History of Present Illness   Juan Clarke is a  43 y/o male with PMH for Alcoholism, ETOH abuse, Severe major depression, Trigeminal Neuralgia, HTN, Gout and Hypothyroidism who presented to ED via EMS after he was found passed out on the couch.  He was able to eventually arouse but very confused and not able to effectively communicate.  In the ED patient very diaphoretic and confused.  However, he became increasingly more agitated.  He has received multiples doses of Ativan  and received 200mg  IV Phenobarbital .  However, he had to be placed on 4 point restraints.  His CIWA score was above 21.    ED labs revealed an increased LA 4.1, Ammonia level 125. Admitted with dx Acute EYOH withdraw, ARF, DT's, lactic acidosis     Clinical Impressions PTA, patient was living in a half way house for alcohol rehab but since relapse patient reports his ability to return has been terminated. 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 Currently, patient weaned from vent yesterday and now on O2 via [place] 3 ltrs with prolonged bed rest 10+ days and up for 1st time this session. Patient presents with deficits outlined below (see OT Problem List for details) most significantly +2 support due to multiple lines/leads/O2 dep and rectal tube management), decreased activity tolerance and balance impacting functional mobility (see PT eval for hallway distances) and BADL's. Anticipate with continue Acute hospital level OT services patient should continue to progress to allow for discharge to ETOH abuse program if able to mobilize and perform at mod I level. Will continue to follow acutely.      If plan is discharge home, recommend the following:   A lot of help with walking and/or transfers;A lot of help with bathing/dressing/bathroom;Assistance with cooking/housework;Assistance with  feeding;Direct supervision/assist for medications management;Direct supervision/assist for financial management;Assist for transportation;Help with stairs or ramp for entrance     Functional Status Assessment   Patient has had a recent decline in their functional status and demonstrates the ability to make significant improvements in function in a reasonable and predictable amount of time.     Equipment Recommendations   Other (comment) (TBD but most likely no OT DME except shower chair)      Precautions/Restrictions   Precautions Precautions: Fall;Other (comment) Precaution/Restrictions Comments: CVC triple lumen, Coretrak, rectal tube Restrictions Weight Bearing Restrictions Per Provider Order: No Other Position/Activity Restrictions: watch SpO2     Mobility Bed Mobility Overal bed mobility: Needs Assistance Bed Mobility: Supine to Sit     Supine to sit: +2 for physical assistance, +2 for safety/equipment, HOB elevated, Used rails, Min assist     General bed mobility comments: multiple lines, leads and rectal tube safety needs vs physical deficits    Transfers Overall transfer level: Needs assistance Equipment used: Rolling walker (2 wheels) Transfers: Sit to/from Stand, Bed to chair/wheelchair/BSC Sit to Stand: Min assist, +2 physical assistance, +2 safety/equipment, From elevated surface     Step pivot transfers: Min assist, +2 physical assistance, +2 safety/equipment, From elevated surface     General transfer comment: see PT for hallway distances      Balance Overall balance assessment: Mild deficits observed, not formally tested (due to prolonged bed rest 10+ days)  ADL either performed or assessed with clinical judgement   ADL Overall ADL's : Needs assistance/impaired Eating/Feeding: NPO (coretrak in place as of this am)   Grooming: Wash/dry hands;Wash/dry face;Sitting;Set up   Upper Body  Bathing: Minimal assistance;Sitting   Lower Body Bathing: Maximal assistance;Sit to/from stand   Upper Body Dressing : Minimal assistance;Sitting   Lower Body Dressing: Maximal assistance;Sit to/from stand   Toilet Transfer: +2 for physical assistance;+2 for safety/equipment;Minimal assistance   Toileting- Clothing Manipulation and Hygiene: Set up;Bed level (use urinal as Foley just pulled)       Functional mobility during ADLs: +2 for physical assistance;+2 for safety/equipment;Minimal assistance;Rolling walker (2 wheels) (multiple lines and leads management) General ADL Comments: multiple lines, leads and rectal tube limiting LB self care but anticipate as med stability increased assistance levels will decrease     Vision Baseline Vision/History: 1 Wears glasses;0 No visual deficits Ability to See in Adequate Light: 0 Adequate Patient Visual Report: No change from baseline Vision Assessment?: No apparent visual deficits;Wears glasses for reading;Wears glasses for driving     Perception Perception: Within Functional Limits       Praxis Praxis: [facility]       Pertinent Vitals/Pain Pain Assessment Pain Assessment: Faces Faces Pain Scale: Hurts a little bit Facial Expression: Tense Body Movements: Protection Muscle Tension: Tense, rigid Compliance with ventilator (intubated pts.): N/A Vocalization (extubated pts.): Talking in normal tone or no sound CPOT Total: 3 Pain Location: "stiff and slightly all over" Pain Descriptors / Indicators: Sore Pain Intervention(s): Monitored during session, Relaxation     Extremity/Trunk Assessment Upper Extremity Assessment Upper Extremity Assessment: Overall WFL for tasks assessed;Right hand dominant   Lower Extremity Assessment Lower Extremity Assessment: Defer to PT evaluation   Cervical / Trunk Assessment Cervical / Trunk Assessment: Normal (mildly increased body habitus)   Communication Communication Communication: No apparent  difficulties   Cognition Arousal: Alert Behavior During Therapy: WFL for tasks assessed/performed Cognition: No apparent impairments                               Following commands: Intact       Cueing  General Comments   Cueing Techniques: Verbal cues  SpO2 on 3 ltrs via [place] 98-100% with cues for breathing integration           Home Living Family/patient expects to be discharged to:: Unsure                                 Additional Comments: patient reports he was evicted from halfway house due to relapse of ETOH abuse      Prior Functioning/Environment Prior Level of Function : Independent/Modified Independent             Mobility Comments: 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 ADLs Comments: independent    OT Problem List: Decreased activity tolerance;Impaired balance (sitting and/or standing);Cardiopulmonary status limiting activity;Obesity;Pain   OT Treatment/Interventions: Self-care/ADL training;Therapeutic exercise;Neuromuscular education;Energy conservation;DME and/or AE instruction;Therapeutic activities;Patient/family education;Balance training      OT Goals(Current goals can be found in the care plan section)   Acute Rehab OT Goals Patient Stated Goal: to get stronger OT Goal Formulation: With patient Time For Goal Achievement: 02/10/24 Potential to Achieve Goals: Good ADL Goals Pt Will Perform Grooming: with modified independence;sitting Pt Will Perform Upper Body Bathing: with modified independence;sitting Pt  Will Perform Lower Body Bathing: with modified independence;sit to/from stand Pt Will Perform Upper Body Dressing: with modified independence;sitting Pt Will Perform Lower Body Dressing: with modified independence;sit to/from stand Pt Will Transfer to Toilet: with modified independence;ambulating Pt Will Perform Tub/Shower Transfer: with supervision;shower seat;grab bars Additional ADL Goal  #1: Patient will teach back 4/4 ECTs during ADL's and mobility independently   OT Frequency:  Min 2X/week    Co-evaluation PT/OT/SLP Co-Evaluation/Treatment: Yes Reason for Co-Treatment: Complexity of the patient's impairments (multi-system involvement);For patient/therapist safety;To address functional/ADL transfers PT goals addressed during session: Mobility/safety with mobility;Balance OT goals addressed during session: ADL's and self-care;Proper use of Adaptive equipment and DME      AM-PAC OT "6 Clicks" Daily Activity     Outcome Measure Help from another person eating meals?: Total Help from another person taking care of personal grooming?: A Little Help from another person toileting, which includes using toliet, bedpan, or urinal?: A Lot Help from another person bathing (including washing, rinsing, drying)?: A Lot Help from another person to put on and taking off regular upper body clothing?: A Little Help from another person to put on and taking off regular lower body clothing?: A Lot 6 Click Score: 13   End of Session Equipment Utilized During Treatment: Gait belt;Rolling walker (2 wheels);Oxygen Nurse Communication: Mobility status  Activity Tolerance: Patient tolerated treatment well Patient left: in chair;with call bell/phone within reach;with chair alarm set;with nursing/sitter in room  OT Visit Diagnosis: Unsteadiness on feet (R26.81);Pain                Time: 1610-9604 OT Time Calculation (min): 47 min Charges:  OT General Charges $OT Visit: 1 Visit OT Evaluation $OT Eval Moderate Complexity: 1 Mod OT Treatments $Therapeutic Activity: 23-37 mins  Marjorie Lussier OT/L Acute Rehabilitation Department  [phone]  01/27/2024, 12:16 PM

## 2024-01-27 NOTE — Progress Notes (Signed)
 eLink Physician-Brief Progress Note Patient Name: Juan Clarke DOB: 11-17-1980 MRN: [id number]   Date of Service  01/27/2024  HPI/Events of Note  Patient advanced to soft diet, this evening, he developed nausea and emesis and  feeding tube came out with the emesis.  The patient was concerned that the part of the bridle may have been swallowed.  Reports no respiratory distress, no abdominal discomfort, no hematemesis.  The explanted feeding tube is intact  eICU Interventions  No intervention, continue observation unless the patient becomes symptomatic     Intervention Category Minor Interventions: Clinical assessment - ordering diagnostic tests  Litisha Guagliardo 01/27/2024, 11:48 PM

## 2024-01-28 DIAGNOSIS — J9601 Acute respiratory failure with hypoxia: Secondary | ICD-10-CM | POA: Diagnosis not present

## 2024-01-28 DIAGNOSIS — F10931 Alcohol use, unspecified with withdrawal delirium: Secondary | ICD-10-CM | POA: Diagnosis not present

## 2024-01-28 DIAGNOSIS — G9341 Metabolic encephalopathy: Secondary | ICD-10-CM | POA: Diagnosis not present

## 2024-01-28 LAB — BASIC METABOLIC PANEL WITH GFR
Anion gap: 8 (ref 5–15)
BUN: 16 mg/dL (ref 6–20)
CO2: 23 mmol/L (ref 22–32)
Calcium: 8.3 mg/dL — ABNORMAL LOW (ref 8.9–10.3)
Chloride: 103 mmol/L (ref 98–111)
Creatinine, Ser: 0.97 mg/dL (ref 0.61–1.24)
GFR, Estimated: >60 mL/min (ref >60)
Glucose, Bld: 114 mg/dL — ABNORMAL HIGH (ref 70–99)
Potassium: 4 mmol/L (ref 3.5–5.1)
Sodium: 134 mmol/L — ABNORMAL LOW (ref 135–145)

## 2024-01-28 LAB — GLUCOSE, CAPILLARY
Glucose-Capillary: 100 mg/dL — ABNORMAL HIGH (ref 70–99)
Glucose-Capillary: 101 mg/dL — ABNORMAL HIGH (ref 70–99)
Glucose-Capillary: 105 mg/dL — ABNORMAL HIGH (ref 70–99)
Glucose-Capillary: 109 mg/dL — ABNORMAL HIGH (ref 70–99)
Glucose-Capillary: 111 mg/dL — ABNORMAL HIGH (ref 70–99)
Glucose-Capillary: 86 mg/dL (ref 70–99)
Glucose-Capillary: 91 mg/dL (ref 70–99)

## 2024-01-28 LAB — CBC
HCT: 32 % — ABNORMAL LOW (ref 39.0–52.0)
Hemoglobin: 10.3 g/dL — ABNORMAL LOW (ref 13.0–17.0)
MCH: 30.3 pg (ref 26.0–34.0)
MCHC: 32.2 g/dL (ref 30.0–36.0)
MCV: 94.1 fL (ref 80.0–100.0)
Platelets: 268 10*3/uL (ref 150–400)
RBC: 3.4 MIL/uL — ABNORMAL LOW (ref 4.22–5.81)
RDW: 13 % (ref 11.5–15.5)
WBC: 9.1 10*3/uL (ref 4.0–10.5)
nRBC: 0 % (ref 0.0–0.2)

## 2024-01-28 LAB — ALDOLASE: Aldolase: 10.1 U/L (ref 3.3–10.3)

## 2024-01-28 LAB — TRIGLYCERIDES: Triglycerides: 278 mg/dL — ABNORMAL HIGH (ref <150)

## 2024-01-28 LAB — PHOSPHORUS: Phosphorus: 4.5 mg/dL (ref 2.5–4.6)

## 2024-01-28 LAB — MAGNESIUM: Magnesium: 2.5 mg/dL — ABNORMAL HIGH (ref 1.7–2.4)

## 2024-01-28 MED ORDER — HYDROCHLOROTHIAZIDE 25 MG PO TABS
25.0000 mg | ORAL_TABLET | Freq: Every day | ORAL | Status: DC
  Administered 2024-01-28 – 2024-01-30 (×3): 25 mg via ORAL
  Filled 2024-01-28 (×3): qty 1

## 2024-01-28 MED ORDER — ALLOPURINOL 300 MG PO TABS
300.0000 mg | ORAL_TABLET | Freq: Every day | ORAL | Status: DC
  Administered 2024-01-28 – 2024-01-30 (×3): 300 mg via ORAL
  Filled 2024-01-28: qty 1
  Filled 2024-01-28: qty 3
  Filled 2024-01-28: qty 1

## 2024-01-28 MED ORDER — LISINOPRIL 20 MG PO TABS
20.0000 mg | ORAL_TABLET | Freq: Every day | ORAL | Status: DC
  Administered 2024-01-28 – 2024-01-30 (×3): 20 mg via ORAL
  Filled 2024-01-28 (×2): qty 1
  Filled 2024-01-28: qty 2

## 2024-01-28 MED ORDER — METHOCARBAMOL 500 MG PO TABS
500.0000 mg | ORAL_TABLET | Freq: Three times a day (TID) | ORAL | Status: DC | PRN
  Administered 2024-01-29 – 2024-01-30 (×2): 500 mg via ORAL
  Filled 2024-01-28 (×2): qty 1

## 2024-01-28 MED ORDER — PROPRANOLOL HCL 20 MG PO TABS
20.0000 mg | ORAL_TABLET | Freq: Three times a day (TID) | ORAL | Status: DC
  Administered 2024-01-28 – 2024-01-30 (×6): 20 mg via ORAL
  Filled 2024-01-28 (×6): qty 1

## 2024-01-28 MED ORDER — LISINOPRIL-HYDROCHLOROTHIAZIDE 20-25 MG PO TABS: 1.0000 | ORAL_TABLET | Freq: Every day | ORAL | Status: DC

## 2024-01-28 MED ORDER — DEXTROMETHORPHAN POLISTIREX ER 30 MG/5ML PO SUER: 15.0000 mg | Freq: Three times a day (TID) | ORAL | Status: DC | PRN

## 2024-01-28 MED ORDER — SODIUM CHLORIDE 0.9% FLUSH: 10.0000 mL | INTRAVENOUS | Status: DC | PRN

## 2024-01-28 MED ORDER — TRAZODONE HCL 50 MG PO TABS
50.0000 mg | ORAL_TABLET | Freq: Every evening | ORAL | Status: DC | PRN
  Administered 2024-01-30: 50 mg via ORAL
  Filled 2024-01-28: qty 1

## 2024-01-28 MED ORDER — CARBAMAZEPINE 200 MG PO TABS
400.0000 mg | ORAL_TABLET | Freq: Every day | ORAL | Status: DC
  Administered 2024-01-29: 400 mg via ORAL
  Filled 2024-01-28 (×4): qty 2

## 2024-01-28 MED ORDER — ORAL CARE MOUTH RINSE: 15.0000 mL | OROMUCOSAL | Status: DC | PRN

## 2024-01-28 MED ORDER — ATORVASTATIN CALCIUM 10 MG PO TABS
20.0000 mg | ORAL_TABLET | Freq: Every day | ORAL | Status: DC
  Administered 2024-01-29 – 2024-01-30 (×2): 20 mg via ORAL
  Filled 2024-01-28 (×2): qty 2

## 2024-01-28 MED ORDER — LEVOTHYROXINE SODIUM 75 MCG PO TABS
75.0000 ug | ORAL_TABLET | Freq: Every day | ORAL | Status: DC
  Administered 2024-01-28 – 2024-01-30 (×3): 75 ug via ORAL
  Filled 2024-01-28 (×3): qty 1

## 2024-01-28 MED ORDER — DULOXETINE HCL 20 MG PO CPEP
40.0000 mg | ORAL_CAPSULE | Freq: Every day | ORAL | Status: DC
  Administered 2024-01-29 – 2024-01-30 (×2): 40 mg via ORAL
  Filled 2024-01-28 (×2): qty 2

## 2024-01-28 MED ORDER — DEXTROMETHORPHAN HBR 15 MG PO CAPS: 15.0000 mg | ORAL_CAPSULE | Freq: Three times a day (TID) | ORAL | Status: DC | PRN

## 2024-01-28 MED ORDER — SODIUM CHLORIDE 0.9% FLUSH
10.0000 mL | Freq: Two times a day (BID) | INTRAVENOUS | Status: DC
  Administered 2024-01-28: 10 mL
  Administered 2024-01-28: 30 mL
  Administered 2024-01-29 – 2024-01-30 (×3): 10 mL

## 2024-01-28 NOTE — Progress Notes (Signed)
   01/28/24 2304  BiPAP/CPAP/SIPAP  Reason BIPAP/CPAP not in use Other(comment) (BIPAP NOT IN ROOM)

## 2024-01-28 NOTE — Progress Notes (Signed)
   01/28/24 0211  BiPAP/CPAP/SIPAP  Reason BIPAP/CPAP not in use Other(comment) (BIPAP PRN, NOT IN ROOM)  BiPAP/CPAP /SiPAP Vitals  Temp (!) 102 F (38.9 C)  Pulse Rate (!) 105  Resp (!) 26  SpO2 100 %  MEWS Score/Color  MEWS Score 5  MEWS Score Color Red

## 2024-01-28 NOTE — Plan of Care (Signed)
  Problem: Education: Goal: Ability to describe self-care measures that may prevent or decrease complications (Diabetes Survival Skills Education) will improve Outcome: Progressing Goal: Individualized Educational Video(s) Outcome: Progressing   Problem: Coping: Goal: Ability to adjust to condition or change in health will improve Outcome: Progressing   Problem: Fluid Volume: Goal: Ability to maintain a balanced intake and output will improve Outcome: Progressing   Problem: Health Behavior/Discharge Planning: Goal: Ability to identify and utilize available resources and services will improve Outcome: Progressing Goal: Ability to manage health-related needs will improve Outcome: Progressing   Problem: Metabolic: Goal: Ability to maintain appropriate glucose levels will improve Outcome: Progressing   Problem: Nutritional: Goal: Maintenance of adequate nutrition will improve Outcome: Progressing Goal: Progress toward achieving an optimal weight will improve Outcome: Progressing   Problem: Skin Integrity: Goal: Risk for impaired skin integrity will decrease Outcome: Progressing   Problem: Tissue Perfusion: Goal: Adequacy of tissue perfusion will improve Outcome: Progressing   Problem: Education: Goal: Knowledge of General Education information will improve Description: Including pain rating scale, medication(s)/side effects and non-pharmacologic comfort measures Outcome: Progressing   Problem: Health Behavior/Discharge Planning: Goal: Ability to manage health-related needs will improve Outcome: Progressing   Problem: Clinical Measurements: Goal: Ability to maintain clinical measurements within normal limits will improve Outcome: Progressing Goal: Will remain free from infection Outcome: Progressing Goal: Diagnostic test results will improve Outcome: Progressing Goal: Respiratory complications will improve Outcome: Progressing Goal: Cardiovascular complication will  be avoided Outcome: Progressing   Problem: Activity: Goal: Risk for activity intolerance will decrease Outcome: Progressing   Problem: Nutrition: Goal: Adequate nutrition will be maintained Outcome: Progressing   Problem: Coping: Goal: Level of anxiety will decrease Outcome: Progressing   Problem: Elimination: Goal: Will not experience complications related to bowel motility Outcome: Progressing Goal: Will not experience complications related to urinary retention Outcome: Progressing   Problem: Pain Managment: Goal: General experience of comfort will improve and/or be controlled Outcome: Progressing   Problem: Safety: Goal: Ability to remain free from injury will improve Outcome: Progressing   Problem: Skin Integrity: Goal: Risk for impaired skin integrity will decrease Outcome: Progressing   Problem: Safety: Goal: Non-violent Restraint(s) Outcome: Progressing   Problem: Activity: Goal: Ability to tolerate increased activity will improve Outcome: Progressing   Problem: Respiratory: Goal: Ability to maintain a clear airway and adequate ventilation will improve Outcome: Progressing   Problem: Role Relationship: Goal: Method of communication will improve Outcome: Progressing

## 2024-01-28 NOTE — Plan of Care (Signed)
 Patient vomited once this shift causing Coretrax to come out, labetalol  given twice this shift due to elevated blood pressure, CVL does not draw back blood even after several rounds of flushing lines, patient now lab draw and Phlebotomy is aware, no other issue overnight, patient still on precedex  at 0.6 mcg, tolerating soft diet and thin liquids, Plan of care and goals were dicussed with time given for questions.  Problem: Education: Goal: Ability to describe self-care measures that may prevent or decrease complications (Diabetes Survival Skills Education) will improve Outcome: Progressing Goal: Individualized Educational Video(s) Outcome: Progressing   Problem: Coping: Goal: Ability to adjust to condition or change in health will improve Outcome: Progressing   Problem: Fluid Volume: Goal: Ability to maintain a balanced intake and output will improve Outcome: Progressing   Problem: Health Behavior/Discharge Planning: Goal: Ability to identify and utilize available resources and services will improve Outcome: Progressing Goal: Ability to manage health-related needs will improve Outcome: Progressing   Problem: Metabolic: Goal: Ability to maintain appropriate glucose levels will improve Outcome: Progressing   Problem: Nutritional: Goal: Maintenance of adequate nutrition will improve Outcome: Progressing Goal: Progress toward achieving an optimal weight will improve Outcome: Progressing   Problem: Skin Integrity: Goal: Risk for impaired skin integrity will decrease Outcome: Progressing   Problem: Tissue Perfusion: Goal: Adequacy of tissue perfusion will improve Outcome: Progressing   Problem: Education: Goal: Knowledge of General Education information will improve Description: Including pain rating scale, medication(s)/side effects and non-pharmacologic comfort measures Outcome: Progressing   Problem: Health Behavior/Discharge Planning: Goal: Ability to manage  health-related needs will improve Outcome: Progressing   Problem: Clinical Measurements: Goal: Ability to maintain clinical measurements within normal limits will improve Outcome: Progressing Goal: Will remain free from infection Outcome: Progressing Goal: Diagnostic test results will improve Outcome: Progressing Goal: Respiratory complications will improve Outcome: Progressing Goal: Cardiovascular complication will be avoided Outcome: Progressing   Problem: Activity: Goal: Risk for activity intolerance will decrease Outcome: Progressing   Problem: Nutrition: Goal: Adequate nutrition will be maintained Outcome: Progressing   Problem: Coping: Goal: Level of anxiety will decrease Outcome: Progressing   Problem: Elimination: Goal: Will not experience complications related to bowel motility Outcome: Progressing Goal: Will not experience complications related to urinary retention Outcome: Progressing   Problem: Pain Managment: Goal: General experience of comfort will improve and/or be controlled Outcome: Progressing   Problem: Safety: Goal: Ability to remain free from injury will improve Outcome: Progressing   Problem: Skin Integrity: Goal: Risk for impaired skin integrity will decrease Outcome: Progressing   Problem: Safety: Goal: Non-violent Restraint(s) Outcome: Progressing   Problem: Activity: Goal: Ability to tolerate increased activity will improve Outcome: Progressing   Problem: Respiratory: Goal: Ability to maintain a clear airway and adequate ventilation will improve Outcome: Progressing   Problem: Role Relationship: Goal: Method of communication will improve Outcome: Progressing

## 2024-01-28 NOTE — Plan of Care (Signed)
  Problem: Coping: Goal: Ability to adjust to condition or change in health will improve Outcome: Progressing   Problem: Health Behavior/Discharge Planning: Goal: Ability to identify and utilize available resources and services will improve Outcome: Progressing Goal: Ability to manage health-related needs will improve Outcome: Progressing   Problem: Nutritional: Goal: Maintenance of adequate nutrition will improve Outcome: Progressing

## 2024-01-28 NOTE — Progress Notes (Signed)
 NAME:  HOLLY PRING, MRN:  [id number], DOB:  1980/09/27, LOS: 10 ADMISSION DATE:  01/17/2024, CONSULTATION DATE:  01/18/2024 REFERRING MD: Wallis Gun - EDP, CHIEF COMPLAINT:  EtOH withdrawal   History of Present Illness:  43 y/o male with alcoholism, who presented to ED via EMS after he was found passed out on the cough.  He was able to eventually arouse but very confused and not able to effectively communicate.  In the ED patient very diaphoretic and confused.  However, he became increasingly more agitated.  He has received multiples doses of Ativan  and received 200mg  IV Phenobarbital .  However, he had to be placed on 4 point restraints.  His CIWA score was above 21. ED labs revealed an increased LA 4.1, Ammonia level 125.  Pertinent Medical History:   Past Medical History:  Diagnosis Date   GAD (generalized anxiety disorder)    HTN (hypertension)    Insomnia    Migraine without aura    Overweight    Trigeminal neuralgia    Significant Hospital Events: Including procedures, antibiotic start and stop dates in addition to other pertinent events   5/21 - Intubated and admitted to ICU 5/22 - Worsening agitation overnight with increasing sedation requirements >>propofol  dc'd due to high TGS,  ketamine  added 5/22 - Vomiting around ETT, aspiration   5/23 - High-grade fever 104, ketamine  titrated to 2 mg due to severe agitation 5/24 - Breakthrough sedation, resume Versed  drip, added Seroquel , enteral Ativan   5/25 - Several pushes for agitation, finally given vecuronium  x 1 , left IJ CVL placed 5/25 - Episode of desaturation with mucous plugs, improved with bagging and suctioning 5/27 - Extubated. Increased agitation/delirium despite all available measures, ultimately reintubated. Febrile to 103-104F despite APAP, concerning for serotonin syndrome. Cyproheptadine  given. 5/28 - Febrile to 102.86F ~1925, calm/appropriate on sedation throughout the day. Ketamine  discontinued. Phenobarbital  taper  re-dosed. 5/29 - Remains on Precedex , Versed  gtt. Dilaudid /Ketamine  discontinued. Extubated to 4LNC, tolerated well. Ongoing intermittent fevers to 101.86F. Improved mental status.  Interim History / Subjective:  No significant overnight events Slept well Extubated 5/29 Mental status continues to improve Alert and interactive, does not appear agitated   Objective:   Blood pressure (!) 145/68, pulse 77, temperature (!) 100.9 F (38.3 C), resp. rate 16, height 5\' 10"  (1.778 m), weight 100 kg, SpO2 (!) 83%.        Intake/Output Summary (Last 24 hours) at 01/28/2024 1216 Last data filed at 01/28/2024 0800 Gross per 24 hour  Intake 1808.38 ml  Output 2440 ml  Net -631.62 ml   Filed Weights   01/25/24 0500 01/27/24 0500 01/28/24 0444  Weight: 131 kg 109.1 kg 100 kg   Physical Examination: General: Middle-age, does appear comfortable HEENT: Moist oral mucosa Neuro: Awake alert interactive CV: RRR, no m/g/r. PULM: On room air clear breath sounds  Tmax 101 No significant leukocytosis  Assessment and Plan:   Acute metabolic encephalopathy EtOH withdrawal DTs - Appears to be stabilizing - Being weaned off medications for agitation - Takes the Metubine did not been weaned off - On phenobarb taper - CIWA with Ativan  as needed - On thiamine , folate, multivitamin on lactulose  twice daily   There was a concern for serotonin syndrome with him being on multiple medications prior to coming into the hospital including paroxetine , duloxetine , carbamazepine , trazodone, dextromethorphan, Vyvanse - Did receive cyproheptadine  - CK normalizing  Seroquel  3 times daily for agitation being weaned  Acute hypoxemic respiratory failure Aspiration pneumonia On Unasyn  -Completed 10  days of antibiotics  Elevated ESR CRP - Etiology unclear - Autoimmune panel negative so far  Hypertriglyceridemia is improving  High ileus is improving - Tolerating  orally  Hypertension Hyperlipidemia - Resume home antihypertensives  Physical deconditioning   Introduce home meds, was able to confirm his home medications Start Robaxin, start Synthroid , antihypertensive - Had recently been prescribed Cymbalta  at 60 mg - Reinitiating at 40 mg   Best Practice: (right click and "Reselect all SmartList Selections" daily)   Diet/type: Regular diet DVT prophylaxis prophylactic heparin   Pressure ulcer(s): N/A GI prophylaxis: H2B Lines: Central line-discontinue 01/28/2024 Foley:  removal ordered  Code Status:  full code  Critical care time:   Myer Artis, MD [facility] Pager: See Tilford Foley

## 2024-01-29 DIAGNOSIS — F10931 Alcohol use, unspecified with withdrawal delirium: Secondary | ICD-10-CM

## 2024-01-29 LAB — CBC
HCT: 35.9 % — ABNORMAL LOW (ref 39.0–52.0)
Hemoglobin: 11.7 g/dL — ABNORMAL LOW (ref 13.0–17.0)
MCH: 29.9 pg (ref 26.0–34.0)
MCHC: 32.6 g/dL (ref 30.0–36.0)
MCV: 91.8 fL (ref 80.0–100.0)
Platelets: 248 10*3/uL (ref 150–400)
RBC: 3.91 MIL/uL — ABNORMAL LOW (ref 4.22–5.81)
RDW: 13 % (ref 11.5–15.5)
WBC: 11.3 10*3/uL — ABNORMAL HIGH (ref 4.0–10.5)
nRBC: 0 % (ref 0.0–0.2)

## 2024-01-29 LAB — BASIC METABOLIC PANEL WITH GFR
Anion gap: 11 (ref 5–15)
BUN: 14 mg/dL (ref 6–20)
CO2: 24 mmol/L (ref 22–32)
Calcium: 8.8 mg/dL — ABNORMAL LOW (ref 8.9–10.3)
Chloride: 98 mmol/L (ref 98–111)
Creatinine, Ser: 0.87 mg/dL (ref 0.61–1.24)
GFR, Estimated: >60 mL/min (ref >60)
Glucose, Bld: 108 mg/dL — ABNORMAL HIGH (ref 70–99)
Potassium: 3.5 mmol/L (ref 3.5–5.1)
Sodium: 133 mmol/L — ABNORMAL LOW (ref 135–145)

## 2024-01-29 LAB — GLUCOSE, CAPILLARY
Glucose-Capillary: 120 mg/dL — ABNORMAL HIGH (ref 70–99)
Glucose-Capillary: 105 mg/dL — ABNORMAL HIGH (ref 70–99)
Glucose-Capillary: 128 mg/dL — ABNORMAL HIGH (ref 70–99)
Glucose-Capillary: 106 mg/dL — ABNORMAL HIGH (ref 70–99)
Glucose-Capillary: 98 mg/dL (ref 70–99)

## 2024-01-29 LAB — AMMONIA: Ammonia: 38 umol/L — ABNORMAL HIGH (ref 9–35)

## 2024-01-29 MED ORDER — LORAZEPAM 1 MG PO TABS
2.0000 mg | ORAL_TABLET | Freq: Three times a day (TID) | ORAL | Status: DC
  Administered 2024-01-29 – 2024-01-30 (×4): 2 mg via ORAL
  Filled 2024-01-29 (×4): qty 2

## 2024-01-29 MED ORDER — FOLIC ACID 1 MG PO TABS
1.0000 mg | ORAL_TABLET | Freq: Every day | ORAL | Status: DC
  Administered 2024-01-29 – 2024-01-30 (×2): 1 mg via ORAL
  Filled 2024-01-29 (×2): qty 1

## 2024-01-29 MED ORDER — LACTULOSE 10 GM/15ML PO SOLN
20.0000 g | Freq: Every day | ORAL | Status: DC
  Filled 2024-01-29: qty 30

## 2024-01-29 MED ORDER — ACETAMINOPHEN 325 MG PO TABS
650.0000 mg | ORAL_TABLET | Freq: Four times a day (QID) | ORAL | Status: DC | PRN
  Administered 2024-01-29 – 2024-01-30 (×3): 650 mg via ORAL
  Filled 2024-01-29 (×3): qty 2

## 2024-01-29 MED ORDER — POLYETHYLENE GLYCOL 3350 17 G PO PACK: 17.0000 g | PACK | Freq: Every day | ORAL | Status: DC | PRN

## 2024-01-29 NOTE — Progress Notes (Signed)
 PROGRESS NOTE    Juan Clarke  RUE:[id number] DOB: [birth year] DOA: 01/17/2024 PCP: Perley Bradley, MD    Brief Narrative:  43 y/o male with alcoholism, who presented to ED via EMS after he was found passed out on the couch. He was able to eventually arouse but very confused and not able to effectively communicate. In the ED patient very diaphoretic and confused. However, he became increasingly more agitated. He has received multiples doses of Ativan  and received 200mg  IV Phenobarbital . However, he had to be placed on 4 point restraints. His CIWA score was above 21. ED labs revealed an increased LA 4.1, Ammonia level 125.   Patient had to be intubated and has remained in the ICU for the last 11 days.   Assessment and Plan: Acute metabolic encephalopathy EtOH withdrawal DTs - Appears to be stabilizing - Being weaned off medications for agitation Eventual plan is for transition to [facility] pending but will plan to wean lactulose    There was a concern for serotonin syndrome with him being on multiple medications prior to coming into the hospital including paroxetine , duloxetine , carbamazepine , trazodone, dextromethorphan, Vyvanse - Did receive cyproheptadine  - CK normalizing Seroquel  weaned off   Acute hypoxemic respiratory failure Aspiration pneumonia -Completed 10 days of antibiotics -Wean to room air   Elevated ESR CRP - Etiology unclear - Autoimmune panel shows positive rheumatoid factor - Outpatient follow-up   Hypertriglyceridemia is improving   High ileus is resolved - Tolerating orally   Hypertension Hyperlipidemia - Resume home meds - Labs pending   Physical deconditioning  -Ambulate as able  Obesity Estimated body mass index is 31.63 kg/m as calculated from the following:   Height as of this encounter: 5\' 10"  (1.778 m).   Weight as of this encounter: 100 kg.   DVT prophylaxis: heparin  injection 5,000 Units Start: 01/18/24 0600 SCDs Start:  01/18/24 0304    Code Status: Full Code   Disposition Plan:  Level of care: Med-Surg Status is: Inpatient     Consultants:  PCCM  Subjective: Feels better asking if he will be able to go to [facility] today States he is having multiple stools up to 8 yesterday  Objective: Vitals:   01/28/24 1832 01/28/24 1842 01/28/24 1927 01/29/24 0357  BP: (!) 139/52 (!) 139/52 (!) 163/97 (!) 147/93  Pulse: 99 99 85 89  Resp:   18 18  Temp:   99.2 F (37.3 C) 97.9 F (36.6 C)  TempSrc:      SpO2:   99% 97%  Weight:      Height:        Intake/Output Summary (Last 24 hours) at 01/29/2024 0958 Last data filed at 01/28/2024 2033 Gross per 24 hour  Intake 188.33 ml  Output 850 ml  Net -661.67 ml   Filed Weights   01/25/24 0500 01/27/24 0500 01/28/24 0444  Weight: 131 kg 109.1 kg 100 kg    Examination:   General: Appearance:    Obese male in no acute distress     Lungs:     Clear to auscultation bilaterally, respirations unlabored  Heart:    Normal heart rate. Normal rhythm. No murmurs, rubs, or gallops.    MS:   All extremities are intact.    Neurologic:   Awake, alert       Data Reviewed: I have personally reviewed following labs and imaging studies  CBC: Recent Labs  Lab 01/23/24 0428 01/24/24 0535 01/25/24 1478 01/26/24 2956 01/27/24 0357 01/28/24 2130  WBC 6.1 6.0 6.2 6.6 7.9 9.1  NEUTROABS 4.0 3.7  --   --   --   --   HGB 10.0* 9.0* 8.9* 8.6* 10.2* 10.3*  HCT 30.7* 28.9* 28.3* 27.7* 31.8* 32.0*  MCV 94.2 96.7 97.3 97.5 93.3 94.1  PLT 193 204 179 236 318 268   Basic Metabolic Panel: Recent Labs  Lab 01/24/24 0535 01/25/24 0516 01/26/24 0552 01/26/24 1604 01/27/24 0357 01/28/24 0724  NA 143 138 136  --  137 134*  K 3.5 3.5 3.3*  --  3.6 4.0  CL 106 103 100  --  106 103  CO2 27 26 28   --  25 23  GLUCOSE 125* 118* 151*  --  122* 114*  BUN 12 18 23*  --  16 16  CREATININE 1.21 0.84 1.24  --  0.88 0.97  CALCIUM  8.4* 8.4* 8.3*  --  8.3* 8.3*   MG 2.3 2.4 2.5* 2.4 2.4 2.5*  PHOS 4.9* 4.9* 5.7* 3.7 3.4 4.5   GFR: Estimated Creatinine Clearance: 118.8 mL/min (by C-G formula based on SCr of 0.97 mg/dL). Liver Function Tests: Recent Labs  Lab 01/24/24 1148 01/27/24 0357  AST 98* 85*  ALT 68* 122*  ALKPHOS 158* 204*  BILITOT 0.8 0.6  PROT 6.5 6.6  ALBUMIN 2.8* 2.7*   Recent Labs  Lab 01/24/24 1148  LIPASE 63*   Recent Labs  Lab 01/23/24 0930  AMMONIA 39*   Coagulation Profile: No results for input(s): "INR", "PROTIME" in the last 168 hours. Cardiac Enzymes: Recent Labs  Lab 01/24/24 1148 01/26/24 0346 01/27/24 0357  CKTOTAL 2,194* 4,538* 419*   BNP (last 3 results) No results for input(s): "PROBNP" in the last 8760 hours. HbA1C: No results for input(s): "HGBA1C" in the last 72 hours. CBG: Recent Labs  Lab 01/28/24 1549 01/28/24 2023 01/28/24 2333 01/29/24 0354 01/29/24 0729  GLUCAP 105* 91 101* 120* 105*   Lipid Profile: Recent Labs    01/28/24 0724  TRIG 278*   Thyroid Function Tests: No results for input(s): "TSH", "T4TOTAL", "FREET4", "T3FREE", "THYROIDAB" in the last 72 hours. Anemia Panel: Recent Labs    01/26/24 1648  FERRITIN 396*   Sepsis Labs: Recent Labs  Lab 01/24/24 1148 01/24/24 1220  PROCALCITON 0.10  --   LATICACIDVEN 1.6 1.0    Recent Results (from the past 240 hours)  Culture, Respiratory w Gram Stain     Status: None   Collection Time: 01/20/24  9:00 AM   Specimen: Tracheal Aspirate; Respiratory  Result Value Ref Range Status   Specimen Description   Final    TRACHEAL ASPIRATE Performed at [facility], [address], [place], [place] [address]    Special Requests   Final    NONE Performed at [facility], [address] [place], [place] [address]    Gram Stain   Final    ABUNDANT WBC PRESENT, PREDOMINANTLY PMN NO ORGANISMS SEEN    Culture   Final    NO GROWTH 2 DAYS Performed at [facility], [address], [place], [place] [address]    Report Status 01/22/2024 FINAL  Final  Culture, blood (Routine X 2) w Reflex to ID Panel     Status: None   Collection Time: 01/22/24  2:52 PM   Specimen: BLOOD  Result Value Ref Range Status   Specimen Description   Final    BLOOD LEFT ANTECUBITAL Performed at [facility], [address] [place], [place] [address]    Special Requests   Final    BOTTLES DRAWN AEROBIC AND ANAEROBIC Blood Culture results may not be optimal due to an inadequate volume of blood received in culture bottles Performed at [facility], [address], [place], [place] [address]    Culture   Final    NO GROWTH 5 DAYS Performed at [facility], [address], [place], [place] [address]    Report Status 01/27/2024 FINAL  Final  Culture, blood (Routine X 2) w Reflex to ID Panel     Status: None   Collection Time: 01/22/24  2:52 PM   Specimen: BLOOD  Result Value Ref Range Status   Specimen Description   Final    BLOOD BLOOD LEFT HAND Performed at [facility], [address] [place], [place] [address]    Special Requests   Final    BOTTLES DRAWN AEROBIC ONLY Blood Culture results may not be optimal due to an inadequate volume of blood received in culture bottles Performed at [facility], [address], [place], [place] [address]    Culture   Final    NO GROWTH 5 DAYS Performed at [facility], [address] [place], [place] [address]    Report Status 01/27/2024 FINAL  Final  Culture, Respiratory w Gram Stain     Status: None   Collection Time: 01/23/24  7:50 AM   Specimen: Tracheal Aspirate; Respiratory  Result Value Ref Range Status   Specimen Description   Final    TRACHEAL ASPIRATE Performed at [facility], [address] [place], [place] [address]    Special Requests   Final    NONE Performed at [facility], [address], [place], [place] [address]    Gram Stain   Final    FEW WBC PRESENT, PREDOMINANTLY PMN NO ORGANISMS SEEN    Culture   Final    NO GROWTH 2 DAYS Performed at [facility], [address], [place], [place] [address]    Report Status 01/25/2024 FINAL  Final  Respiratory (~20 pathogens) panel by PCR     Status: None   Collection Time: 01/24/24 11:48 AM   Specimen: Nasopharyngeal Swab; Respiratory  Result Value Ref Range Status   Adenovirus NOT DETECTED NOT DETECTED Final   Coronavirus 229E NOT DETECTED NOT DETECTED Final    Comment: (NOTE) The Coronavirus on the Respiratory Panel, DOES NOT test for the novel  Coronavirus (2019 nCoV)    Coronavirus HKU1 NOT DETECTED NOT DETECTED Final   Coronavirus NL63 NOT DETECTED NOT DETECTED Final   Coronavirus OC43 NOT DETECTED NOT DETECTED Final   Metapneumovirus NOT DETECTED NOT DETECTED Final   Rhinovirus / Enterovirus NOT DETECTED NOT DETECTED Final   Influenza A NOT DETECTED NOT DETECTED Final   Influenza B NOT DETECTED NOT DETECTED Final   Parainfluenza Virus 1 NOT DETECTED NOT DETECTED Final   Parainfluenza Virus 2 NOT DETECTED NOT DETECTED Final   Parainfluenza Virus 3 NOT DETECTED NOT DETECTED Final   Parainfluenza Virus 4 NOT DETECTED NOT DETECTED Final   Respiratory Syncytial Virus NOT DETECTED NOT DETECTED Final   Bordetella pertussis NOT DETECTED NOT DETECTED Final   Bordetella Parapertussis NOT DETECTED NOT DETECTED Final   Chlamydophila pneumoniae NOT DETECTED NOT DETECTED Final   Mycoplasma pneumoniae NOT DETECTED NOT DETECTED Final    Comment: Performed at [facility], [address] [place], [place] [address]         Radiology Studies: No results found.      Scheduled Meds:  allopurinol   300 mg Oral Daily   atorvastatin   20 mg Oral Daily   carbamazepine   400 mg Oral QHS   Chlorhexidine  Gluconate Cloth  6 each Topical Daily   docusate sodium   100 mg Oral BID   DULoxetine   40 mg Oral Daily    famotidine   20 mg Oral BID   folic acid   1 mg Oral Daily   heparin   5,000 Units Subcutaneous Q8H   lisinopril   20 mg Oral Daily   And   hydrochlorothiazide  25 mg Oral Daily   insulin  aspart  0-15 Units Subcutaneous Q4H   [START ON 01/30/2024] lactulose   20 g Oral Daily   levothyroxine   75 mcg Oral QAC breakfast   LORazepam   2 mg Oral TID   propranolol  20 mg Oral TID   sodium chloride  flush  10-40 mL Intracatheter Q12H   thiamine   100 mg Oral Daily   Or   thiamine   100 mg Intravenous Daily   Continuous Infusions:   LOS: 11 days    Time spent: 45 minutes spent on chart review, discussion with nursing staff, consultants, updating family and interview/physical exam; more than 50% of that time was spent in counseling and/or coordination of care.    Enrigue Harvard, DO [facility] Available via Epic secure chat 7am-7pm After these hours, please refer to coverage provider listed on [email or web address] 01/29/2024, 9:58 AM

## 2024-01-29 NOTE — Progress Notes (Signed)
 PT Cancellation Note  Patient Details Name: Juan Clarke MRN: [id number] DOB: [birth year]   Cancelled Treatment:    Reason Eval/Treat Not Completed:  Attempted PT tx session-pt politley declined participation at this time. Will check back as schedule allows.    Tanda Falter, PT Acute Rehabilitation  Office: [phone]

## 2024-01-29 NOTE — Progress Notes (Signed)
   01/29/24 2247  BiPAP/CPAP/SIPAP  Reason BIPAP/CPAP not in use Non-compliant (PATIENT REFUSES, UNIT NOT IN ROOM)

## 2024-01-29 NOTE — Progress Notes (Signed)
 Physical Therapy Treatment Patient Details Name: RALLY OUCH MRN: [id number] DOB: [birth year] Today's Date: 01/29/2024   History of Present Illness Juan Clarke is a  43 y/o male with PMH for Alcoholism, ETOH abuse, Severe major depression, Trigeminal Neuralgia, HTN, Gout and Hypothyroidism who presented to ED via EMS after he was found passed out on the couch.  He was able to eventually arouse but very confused and not able to effectively communicate.  In the ED patient very diaphoretic and confused.  However, he became increasingly more agitated.  He has received multiples doses of Ativan  and received 200mg  IV Phenobarbital .  However, he had to be placed on 4 point restraints.  His CIWA score was above 21.    ED labs revealed an increased LA 4.1, Ammonia level 125. Admitted with dx Acute ETOH withdraw, ARF, DT's, lactic acidosis.  Pt intubated for several days and extubated 01/26/24    PT Comments  Pt agreeable to work with therapy. He reports he has been ambulating to/from bathroom unassisted. He tolerated session well. He is hopeful to be able to go rehab soon.    If plan is discharge home, recommend the following:     Can travel by private vehicle        Equipment Recommendations  None recommended by PT    Recommendations for Other Services       Precautions / Restrictions Precautions Precautions: Fall Restrictions Weight Bearing Restrictions Per Provider Order: No     Mobility  Bed Mobility Overal bed mobility: Modified Independent                  Transfers Overall transfer level: Modified independent                      Ambulation/Gait Ambulation/Gait assistance: Supervision Gait Distance (Feet): 300 Feet Assistive device: None Gait Pattern/deviations: Step-through pattern, Decreased stride length       General Gait Details: intermittent unsteadiness but no overt LOB. tolerated distance well. denied dizziness   Stairs              Wheelchair Mobility     Tilt Bed    Modified Rankin (Stroke Patients Only)       Balance Overall balance assessment: Needs assistance         Standing balance support: During functional activity Standing balance-Leahy Scale: Fair                              Hotel manager: No apparent difficulties  Cognition Arousal: Alert Behavior During Therapy: WFL for tasks assessed/performed   PT - Cognitive impairments: No apparent impairments                         Following commands: Intact      Cueing Cueing Techniques: Verbal cues  Exercises      General Comments        Pertinent Vitals/Pain Pain Assessment Pain Assessment: No/denies pain    Home Living                          Prior Function            PT Goals (current goals can now be found in the care plan section) Progress towards PT goals: Progressing toward goals    Frequency    Min 3X/week  PT Plan      Co-evaluation              AM-PAC PT "6 Clicks" Mobility   Outcome Measure  Help needed turning from your back to your side while in a flat bed without using bedrails?: None Help needed moving from lying on your back to sitting on the side of a flat bed without using bedrails?: None Help needed moving to and from a bed to a chair (including a wheelchair)?: None Help needed standing up from a chair using your arms (e.g., wheelchair or bedside chair)?: None Help needed to walk in hospital room?: A Little Help needed climbing 3-5 steps with a railing? : A Little 6 Click Score: 22    End of Session Equipment Utilized During Treatment: Gait belt Activity Tolerance: Patient tolerated treatment well Patient left: in bed;with call bell/phone within reach   PT Visit Diagnosis: Difficulty in walking, not elsewhere classified (R26.2)     Time: 1308-6578 PT Time Calculation (min) (ACUTE ONLY): 15 min  Charges:     $Gait Training: 8-22 mins PT General Charges $$ ACUTE PT VISIT: 1 Visit                        Tanda Falter, PT Acute Rehabilitation  Office: [phone]

## 2024-01-29 NOTE — Plan of Care (Signed)
  Problem: Education: Goal: Ability to describe self-care measures that may prevent or decrease complications (Diabetes Survival Skills Education) will improve Outcome: Progressing Goal: Individualized Educational Video(s) Outcome: Progressing   Problem: Coping: Goal: Ability to adjust to condition or change in health will improve Outcome: Progressing   Problem: Fluid Volume: Goal: Ability to maintain a balanced intake and output will improve Outcome: Progressing   Problem: Health Behavior/Discharge Planning: Goal: Ability to identify and utilize available resources and services will improve Outcome: Progressing Goal: Ability to manage health-related needs will improve Outcome: Progressing   Problem: Metabolic: Goal: Ability to maintain appropriate glucose levels will improve Outcome: Progressing   Problem: Nutritional: Goal: Maintenance of adequate nutrition will improve Outcome: Progressing Goal: Progress toward achieving an optimal weight will improve Outcome: Progressing   Problem: Skin Integrity: Goal: Risk for impaired skin integrity will decrease Outcome: Progressing   Problem: Tissue Perfusion: Goal: Adequacy of tissue perfusion will improve Outcome: Progressing   Problem: Education: Goal: Knowledge of General Education information will improve Description: Including pain rating scale, medication(s)/side effects and non-pharmacologic comfort measures Outcome: Progressing   Problem: Health Behavior/Discharge Planning: Goal: Ability to manage health-related needs will improve Outcome: Progressing   Problem: Clinical Measurements: Goal: Ability to maintain clinical measurements within normal limits will improve Outcome: Progressing Goal: Will remain free from infection Outcome: Progressing Goal: Diagnostic test results will improve Outcome: Progressing Goal: Respiratory complications will improve Outcome: Progressing Goal: Cardiovascular complication will  be avoided Outcome: Progressing   Problem: Activity: Goal: Risk for activity intolerance will decrease Outcome: Progressing   Problem: Nutrition: Goal: Adequate nutrition will be maintained Outcome: Progressing   Problem: Coping: Goal: Level of anxiety will decrease Outcome: Progressing   Problem: Elimination: Goal: Will not experience complications related to bowel motility Outcome: Progressing Goal: Will not experience complications related to urinary retention Outcome: Progressing   Problem: Pain Managment: Goal: General experience of comfort will improve and/or be controlled Outcome: Progressing   Problem: Safety: Goal: Ability to remain free from injury will improve Outcome: Progressing   Problem: Skin Integrity: Goal: Risk for impaired skin integrity will decrease Outcome: Progressing   Problem: Safety: Goal: Non-violent Restraint(s) Outcome: Progressing   Problem: Activity: Goal: Ability to tolerate increased activity will improve Outcome: Progressing   Problem: Respiratory: Goal: Ability to maintain a clear airway and adequate ventilation will improve Outcome: Progressing   Problem: Role Relationship: Goal: Method of communication will improve Outcome: Progressing

## 2024-01-30 ENCOUNTER — Other Ambulatory Visit (HOSPITAL_COMMUNITY): Payer: Self-pay

## 2024-01-30 ENCOUNTER — Other Ambulatory Visit (HOSPITAL_BASED_OUTPATIENT_CLINIC_OR_DEPARTMENT_OTHER): Payer: Self-pay

## 2024-01-30 ENCOUNTER — Inpatient Hospital Stay (HOSPITAL_COMMUNITY)

## 2024-01-30 ENCOUNTER — Encounter (HOSPITAL_COMMUNITY): Payer: Self-pay

## 2024-01-30 ENCOUNTER — Other Ambulatory Visit: Payer: Self-pay

## 2024-01-30 ENCOUNTER — Emergency Department (HOSPITAL_COMMUNITY)

## 2024-01-30 ENCOUNTER — Inpatient Hospital Stay (HOSPITAL_COMMUNITY)
Admission: EM | Admit: 2024-01-30 | Discharge: 2024-02-04 | DRG: 175 | Disposition: A | Discharge status: Home / Self Care | Attending: Internal Medicine | Admitting: Internal Medicine

## 2024-01-30 DIAGNOSIS — R9431 Abnormal electrocardiogram [ECG] [EKG]: Secondary | ICD-10-CM | POA: Diagnosis present

## 2024-01-30 DIAGNOSIS — R000 Tachycardia, unspecified: Secondary | ICD-10-CM | POA: Diagnosis present

## 2024-01-30 DIAGNOSIS — F32A Depression, unspecified: Secondary | ICD-10-CM | POA: Diagnosis present

## 2024-01-30 DIAGNOSIS — I82433 Acute embolism and thrombosis of popliteal vein, bilateral: Secondary | ICD-10-CM | POA: Diagnosis present

## 2024-01-30 DIAGNOSIS — R7401 Elevation of levels of liver transaminase levels: Secondary | ICD-10-CM | POA: Diagnosis present

## 2024-01-30 DIAGNOSIS — Z79890 Hormone replacement therapy (postmenopausal): Secondary | ICD-10-CM

## 2024-01-30 DIAGNOSIS — I700 Atherosclerosis of aorta: Secondary | ICD-10-CM | POA: Diagnosis present

## 2024-01-30 DIAGNOSIS — F1010 Alcohol abuse, uncomplicated: Secondary | ICD-10-CM | POA: Diagnosis present

## 2024-01-30 DIAGNOSIS — I2699 Other pulmonary embolism without acute cor pulmonale: Secondary | ICD-10-CM | POA: Diagnosis present

## 2024-01-30 DIAGNOSIS — E039 Hypothyroidism, unspecified: Secondary | ICD-10-CM | POA: Diagnosis present

## 2024-01-30 DIAGNOSIS — Z888 Allergy status to other drugs, medicaments and biological substances status: Secondary | ICD-10-CM

## 2024-01-30 DIAGNOSIS — I82453 Acute embolism and thrombosis of peroneal vein, bilateral: Secondary | ICD-10-CM | POA: Diagnosis present

## 2024-01-30 DIAGNOSIS — F10931 Alcohol use, unspecified with withdrawal delirium: Secondary | ICD-10-CM | POA: Diagnosis not present

## 2024-01-30 DIAGNOSIS — I2609 Other pulmonary embolism with acute cor pulmonale: Secondary | ICD-10-CM | POA: Diagnosis not present

## 2024-01-30 DIAGNOSIS — Z808 Family history of malignant neoplasm of other organs or systems: Secondary | ICD-10-CM

## 2024-01-30 DIAGNOSIS — I82431 Acute embolism and thrombosis of right popliteal vein: Secondary | ICD-10-CM | POA: Diagnosis present

## 2024-01-30 DIAGNOSIS — F1011 Alcohol abuse, in remission: Secondary | ICD-10-CM | POA: Diagnosis present

## 2024-01-30 DIAGNOSIS — Z813 Family history of other psychoactive substance abuse and dependence: Secondary | ICD-10-CM

## 2024-01-30 DIAGNOSIS — D72829 Elevated white blood cell count, unspecified: Secondary | ICD-10-CM | POA: Diagnosis present

## 2024-01-30 DIAGNOSIS — F411 Generalized anxiety disorder: Secondary | ICD-10-CM | POA: Diagnosis present

## 2024-01-30 DIAGNOSIS — R7989 Other specified abnormal findings of blood chemistry: Secondary | ICD-10-CM | POA: Diagnosis present

## 2024-01-30 DIAGNOSIS — Z8249 Family history of ischemic heart disease and other diseases of the circulatory system: Secondary | ICD-10-CM

## 2024-01-30 DIAGNOSIS — Z6836 Body mass index (BMI) 36.0-36.9, adult: Secondary | ICD-10-CM

## 2024-01-30 DIAGNOSIS — R509 Fever, unspecified: Secondary | ICD-10-CM | POA: Diagnosis present

## 2024-01-30 DIAGNOSIS — N179 Acute kidney failure, unspecified: Secondary | ICD-10-CM | POA: Diagnosis present

## 2024-01-30 DIAGNOSIS — Z818 Family history of other mental and behavioral disorders: Secondary | ICD-10-CM

## 2024-01-30 DIAGNOSIS — I4581 Long QT syndrome: Principal | ICD-10-CM

## 2024-01-30 DIAGNOSIS — Z79899 Other long term (current) drug therapy: Secondary | ICD-10-CM

## 2024-01-30 DIAGNOSIS — I10 Essential (primary) hypertension: Secondary | ICD-10-CM | POA: Diagnosis present

## 2024-01-30 DIAGNOSIS — E66812 Obesity, class 2: Secondary | ICD-10-CM | POA: Diagnosis present

## 2024-01-30 DIAGNOSIS — E782 Mixed hyperlipidemia: Secondary | ICD-10-CM | POA: Diagnosis present

## 2024-01-30 LAB — CBC
HCT: 35.2 % — ABNORMAL LOW (ref 39.0–52.0)
HCT: 37.5 % — ABNORMAL LOW (ref 39.0–52.0)
Hemoglobin: 11.9 g/dL — ABNORMAL LOW (ref 13.0–17.0)
Hemoglobin: 12.7 g/dL — ABNORMAL LOW (ref 13.0–17.0)
MCH: 29.9 pg (ref 26.0–34.0)
MCH: 30.2 pg (ref 26.0–34.0)
MCHC: 33.8 g/dL (ref 30.0–36.0)
MCHC: 33.9 g/dL (ref 30.0–36.0)
MCV: 88.2 fL (ref 80.0–100.0)
MCV: 89.3 fL (ref 80.0–100.0)
Platelets: 255 10*3/uL (ref 150–400)
Platelets: 307 10*3/uL (ref 150–400)
RBC: 3.94 MIL/uL — ABNORMAL LOW (ref 4.22–5.81)
RBC: 4.25 MIL/uL (ref 4.22–5.81)
RDW: 12.9 % (ref 11.5–15.5)
RDW: 13.1 % (ref 11.5–15.5)
WBC: 13.2 10*3/uL — ABNORMAL HIGH (ref 4.0–10.5)
WBC: 13.3 10*3/uL — ABNORMAL HIGH (ref 4.0–10.5)
nRBC: 0 % (ref 0.0–0.2)
nRBC: 0.2 % (ref 0.0–0.2)

## 2024-01-30 LAB — GLUCOSE, CAPILLARY
Glucose-Capillary: 101 mg/dL — ABNORMAL HIGH (ref 70–99)
Glucose-Capillary: 101 mg/dL — ABNORMAL HIGH (ref 70–99)
Glucose-Capillary: 88 mg/dL (ref 70–99)
Glucose-Capillary: 94 mg/dL (ref 70–99)

## 2024-01-30 LAB — BASIC METABOLIC PANEL WITH GFR
Anion gap: 12 (ref 5–15)
BUN: 18 mg/dL (ref 6–20)
CO2: 22 mmol/L (ref 22–32)
Calcium: 9 mg/dL (ref 8.9–10.3)
Chloride: 97 mmol/L — ABNORMAL LOW (ref 98–111)
Creatinine, Ser: 1.02 mg/dL (ref 0.61–1.24)
GFR, Estimated: >60 mL/min (ref >60)
Glucose, Bld: 105 mg/dL — ABNORMAL HIGH (ref 70–99)
Potassium: 3.4 mmol/L — ABNORMAL LOW (ref 3.5–5.1)
Sodium: 131 mmol/L — ABNORMAL LOW (ref 135–145)

## 2024-01-30 MED ORDER — AMOXICILLIN-POT CLAVULANATE 875-125 MG PO TABS
1.0000 | ORAL_TABLET | Freq: Two times a day (BID) | ORAL | 0 refills | Status: DC
  Filled 2024-01-30: qty 10, 5d supply, fill #0

## 2024-01-30 MED ORDER — AMOXICILLIN-POT CLAVULANATE 875-125 MG PO TABS
1.0000 | ORAL_TABLET | Freq: Two times a day (BID) | ORAL | Status: DC
  Administered 2024-01-30: 1 via ORAL
  Filled 2024-01-30: qty 1

## 2024-01-30 MED ORDER — LACTULOSE 10 GM/15ML PO SOLN
20.0000 g | Freq: Every day | ORAL | 0 refills | Status: DC
  Filled 2024-01-30: qty 236, 7d supply, fill #0

## 2024-01-30 MED ORDER — ADULT MULTIVITAMIN W/MINERALS CH
1.0000 | ORAL_TABLET | Freq: Every day | ORAL | Status: DC
  Administered 2024-01-30: 1 via ORAL
  Filled 2024-01-30: qty 1

## 2024-01-30 MED ORDER — ENSURE PLUS HIGH PROTEIN PO LIQD: 237.0000 mL | Freq: Three times a day (TID) | ORAL | Status: DC

## 2024-01-30 MED ORDER — VITAMIN B-1 100 MG PO TABS
100.0000 mg | ORAL_TABLET | Freq: Every day | ORAL | 0 refills | Status: DC
Start: 2024-01-31 — End: 2024-02-04
  Filled 2024-01-30: qty 30, 30d supply, fill #0

## 2024-01-30 MED ORDER — CARBAMAZEPINE 200 MG PO TABS: 400.0000 mg | ORAL_TABLET | Freq: Every day | ORAL | Status: DC

## 2024-01-30 MED ORDER — LORAZEPAM 1 MG PO TABS: 1.0000 mg | ORAL_TABLET | Freq: Three times a day (TID) | ORAL | Status: DC

## 2024-01-30 MED ORDER — DULOXETINE HCL 20 MG PO CPEP
40.0000 mg | ORAL_CAPSULE | Freq: Every day | ORAL | 0 refills | Status: DC
  Filled 2024-01-30: qty 60, 30d supply, fill #0

## 2024-01-30 MED ORDER — POTASSIUM CHLORIDE CRYS ER 20 MEQ PO TBCR
40.0000 meq | EXTENDED_RELEASE_TABLET | Freq: Once | ORAL | Status: AC
  Administered 2024-01-30: 40 meq via ORAL
  Filled 2024-01-30: qty 4

## 2024-01-30 MED ORDER — LIDOCAINE 5 % EX PTCH
1.0000 | MEDICATED_PATCH | CUTANEOUS | Status: DC
  Administered 2024-01-30: 1 via TRANSDERMAL
  Filled 2024-01-30: qty 1

## 2024-01-30 MED ORDER — LORAZEPAM 1 MG PO TABS
1.0000 mg | ORAL_TABLET | Freq: Three times a day (TID) | ORAL | 0 refills | Status: DC | PRN
  Filled 2024-01-30: qty 10, 4d supply, fill #0

## 2024-01-30 NOTE — TOC Transition Note (Signed)
 Transition of Care [facility]) - Discharge Note   Patient Details  Name: Juan Clarke MRN: [id number] Date of Birth: 1981/02/04  Transition of Care [facility]) CM/SW Contact:  Marty Sleet, LCSW Phone Number: 01/30/2024, 3:52 PM   Clinical Narrative:    Pt wanting to leave to spend time with his children and wants to follow up with [facility] on his own. Spoke with [facility] who will keep his admissions appointment for  02/01/24 ar 12:30pm. Pt can come on his own for admission as long as there has been no use from hospital discharge to admission to rehab. Information for rehab admission placed on AVS.      Barriers to Discharge: Continued Medical Work up   Patient Goals and CMS Choice            Discharge Placement                       Discharge Plan and Services Additional resources added to the After Visit Summary for                                       Social Drivers of Health (SDOH) Interventions SDOH Screenings   Food Insecurity: Patient Unable To Answer (01/18/2024)  Housing: Patient Unable To Answer (01/18/2024)  Transportation Needs: Patient Unable To Answer (01/18/2024)  Utilities: Patient Unable To Answer (01/18/2024)  Social Connections: Unknown (01/11/2022)   Received from [facility]  Tobacco Use: Low Risk  (01/17/2024)     Readmission Risk Interventions    01/20/2024   12:44 PM 01/20/2024    9:59 AM 01/18/2024    3:19 PM  Readmission Risk Prevention Plan  Transportation Screening Complete Complete Complete  PCP or Specialist Appt within 5-7 Days   Complete  PCP or Specialist Appt within 3-5 Days Complete Complete   Home Care Screening   Complete  Medication Review (RN CM)   Complete  HRI or Home Care Consult Complete    Social Work Consult for Recovery Care Planning/Counseling Complete    Palliative Care Screening  Complete   Medication Review Oceanographer) Complete

## 2024-01-30 NOTE — ED Triage Notes (Signed)
 Pt reports ongoing chest pain since being d/c from inpatient here. Pt reports it was hurting at the time but has gotten worse since. Pt reports increased pain with inspiration. Pt reports it feels similar to a spasm.

## 2024-01-30 NOTE — Progress Notes (Signed)
 Physical Therapy Treatment and Discharge Patient Details Name: Juan Clarke MRN: [id number] DOB: 03-04-1981 Today's Date: 01/30/2024   History of Present Illness Kelsie Kramp is a  43 y/o male with PMH for Alcoholism, ETOH abuse, Severe major depression, Trigeminal Neuralgia, HTN, Gout and Hypothyroidism who presented to ED via EMS after he was found passed out on the couch.  He was able to eventually arouse but very confused and not able to effectively communicate.  In the ED patient very diaphoretic and confused.  However, he became increasingly more agitated.  He has received multiples doses of Ativan  and received 200mg  IV Phenobarbital .  However, he had to be placed on 4 point restraints.  His CIWA score was above 21.    ED labs revealed an increased LA 4.1, Ammonia level 125. Admitted with dx Acute ETOH withdraw, ARF, DT's, lactic acidosis.  Pt intubated for several days and extubated 01/26/24    PT Comments  Pt has made excellent progress, meeting PT goals. Encouraged pt to continue to mobilize, no further need for PT in acute setting    If plan is discharge home, recommend the following: Help with stairs or ramp for entrance;Assist for transportation   Can travel by private vehicle        Equipment Recommendations  None recommended by PT    Recommendations for Other Services       Precautions / Restrictions Restrictions Weight Bearing Restrictions Per Provider Order: No     Mobility  Bed Mobility               General bed mobility comments: in recliner and returned to same    Transfers Overall transfer level: Modified independent                 General transfer comment: no physical assist, able to stand without use of hands    Ambulation/Gait Ambulation/Gait assistance: Supervision, Modified independent (Device/Increase time), Independent Gait Distance (Feet): 300 Feet Assistive device: None Gait Pattern/deviations: Step-through pattern, Decreased  stride length       General Gait Details: slight unsteadiness initially  but no overt LOB. tolerated distance well. denied dizziness   Stairs             Wheelchair Mobility     Tilt Bed    Modified Rankin (Stroke Patients Only)       Balance                             High level balance activites: Side stepping, Direction changes, Turns, Head turns High Level Balance Comments: supervision for safety, mildly delayed balance reactions. pt is able to utilize phone, switch phone to both hands and pull up pic while amb without overt LOB            Communication Communication Communication: No apparent difficulties  Cognition Arousal: Alert Behavior During Therapy: WFL for tasks assessed/performed   PT - Cognitive impairments: No apparent impairments                         Following commands: Intact      Cueing    Exercises      General Comments        Pertinent Vitals/Pain Pain Assessment Pain Assessment: 0-10 Faces Pain Scale: Hurts a little bit Pain Location: R lung/side region Pain Descriptors / Indicators: Discomfort Pain Intervention(s): Limited activity within patient's tolerance, Monitored during session,  Premedicated before session    Home Living                          Prior Function            PT Goals (current goals can now be found in the care plan section) Acute Rehab PT Goals PT Goal Formulation: With patient Time For Goal Achievement: 02/09/24 Potential to Achieve Goals: Good Progress towards PT goals: Progressing toward goals    Frequency           PT Plan      Co-evaluation              AM-PAC PT "6 Clicks" Mobility   Outcome Measure  Help needed turning from your back to your side while in a flat bed without using bedrails?: None Help needed moving from lying on your back to sitting on the side of a flat bed without using bedrails?: None Help needed moving to and from a  bed to a chair (including a wheelchair)?: None Help needed standing up from a chair using your arms (e.g., wheelchair or bedside chair)?: None Help needed to walk in hospital room?: None Help needed climbing 3-5 steps with a railing? : None 6 Click Score: 24    End of Session   Activity Tolerance: Patient tolerated treatment well Patient left: in chair;with call bell/phone within reach   PT Visit Diagnosis: Difficulty in walking, not elsewhere classified (R26.2)     Time: 1452-1500 PT Time Calculation (min) (ACUTE ONLY): 8 min  Charges:    $Gait Training: 8-22 mins PT General Charges $$ ACUTE PT VISIT: 1 Visit                     Jack Bolio, PT  Acute Rehab Dept [facility]) [phone]  01/30/2024    [facility] 01/30/2024, 3:06 PM

## 2024-01-30 NOTE — Progress Notes (Addendum)
 PROGRESS NOTE    Juan Clarke  WUJ:[id number] DOB: [birth year] DOA: 01/17/2024 PCP: Perley Bradley, MD    Brief Narrative:  43 y/o male with alcoholism, who presented to ED via EMS after he was found passed out on the couch. He was able to eventually arouse but very confused and not able to effectively communicate. In the ED patient very diaphoretic and confused. However, he became increasingly more agitated. He has received multiples doses of Ativan  and received 200mg  IV Phenobarbital . However, he had to be placed on 4 point restraints. His CIWA score was above 21. ED labs revealed an increased LA 4.1, Ammonia level 125.   Patient had to be intubated and has remained in the ICU for the last 11 days.   Assessment and Plan: Acute metabolic encephalopathy EtOH withdrawal DTs - Appears to be stabilizing - Being weaned off medications for agitation Eventual plan is for transition to [facility] pending but will plan to wean lactulose  for 3 bowel movements per day   There was a concern for serotonin syndrome with him being on multiple medications prior to coming into the hospital including paroxetine , duloxetine , carbamazepine , trazodone, dextromethorphan, Vyvanse - Did receive cyproheptadine  - CK normalizing Seroquel  weaned off   Fever - Incentive spirometer - Resumed p.o. antibiotics and monitor - If fever again over 100.5 we will get blood cultures  Acute hypoxemic respiratory failure Aspiration pneumonia -Completed 10 days of antibiotics -Wean to room air   Elevated ESR CRP - Etiology unclear - Autoimmune panel shows positive rheumatoid factor - Outpatient follow-up   Hypertriglyceridemia is improving   High ileus is resolved - Tolerating orally   Hypertension Hyperlipidemia - Resume home meds - Labs pending   Physical deconditioning  -Ambulate as able  Obesity- type 1 Estimated body mass index is 31.63 kg/m as calculated from the following:   Height  as of this encounter: 5\' 10"  (1.778 m).   Weight as of this encounter: 100 kg.   DVT prophylaxis: heparin  injection 5,000 Units Start: 01/18/24 0600 SCDs Start: 01/18/24 0304    Code Status: Full Code   Disposition Plan:  Level of care: Med-Surg Status is: Inpatient     Consultants:  PCCM  Subjective: [facility] on [facility]  Objective: Vitals:   01/30/24 0410 01/30/24 0705 01/30/24 0944 01/30/24 1153  BP: (!) 141/81  (!) 141/81 (!) 146/98  Pulse: (!) 102  (!) 102   Resp: 18   18  Temp: 100 F (37.8 C)   98.5 F (36.9 C)  TempSrc:      SpO2: 93%     Weight:  100 kg    Height:       No intake or output data in the 24 hours ending 01/30/24 1310  Filed Weights   01/27/24 0500 01/28/24 0444 01/30/24 0705  Weight: 109.1 kg 100 kg 100 kg    Examination:   General: Appearance:    Obese male in no acute distress     Lungs:      respirations unlabored  Heart:    Tachycardic. Normal rhythm. No murmurs, rubs, or gallops.    MS:   All extremities are intact.    Neurologic:   Awake, alert       Data Reviewed: I have personally reviewed following labs and imaging studies  CBC: Recent Labs  Lab 01/24/24 0535 01/25/24 0516 01/26/24 0552 01/27/24 0357 01/28/24 0724 01/29/24 1042 01/30/24 0921  WBC 6.0   < > 6.6 7.9 9.1 11.3*  13.2*  NEUTROABS 3.7  --   --   --   --   --   --   HGB 9.0*   < > 8.6* 10.2* 10.3* 11.7* 11.9*  HCT 28.9*   < > 27.7* 31.8* 32.0* 35.9* 35.2*  MCV 96.7   < > 97.5 93.3 94.1 91.8 89.3  PLT 204   < > 236 318 268 248 255   < > = values in this interval not displayed.   Basic Metabolic Panel: Recent Labs  Lab 01/25/24 0516 01/26/24 0552 01/26/24 1604 01/27/24 0357 01/28/24 0724 01/29/24 1042 01/30/24 0921  NA 138 136  --  137 134* 133* 131*  K 3.5 3.3*  --  3.6 4.0 3.5 3.4*  CL 103 100  --  106 103 98 97*  CO2 26 28  --  25 23 24 22   GLUCOSE 118* 151*  --  122* 114* 108* 105*  BUN 18 23*  --  16 16 14 18   CREATININE  0.84 1.24  --  0.88 0.97 0.87 1.02  CALCIUM  8.4* 8.3*  --  8.3* 8.3* 8.8* 9.0  MG 2.4 2.5* 2.4 2.4 2.5*  --   --   PHOS 4.9* 5.7* 3.7 3.4 4.5  --   --    GFR: Estimated Creatinine Clearance: 113 mL/min (by C-G formula based on SCr of 1.02 mg/dL). Liver Function Tests: Recent Labs  Lab 01/24/24 1148 01/27/24 0357  AST 98* 85*  ALT 68* 122*  ALKPHOS 158* 204*  BILITOT 0.8 0.6  PROT 6.5 6.6  ALBUMIN 2.8* 2.7*   Recent Labs  Lab 01/24/24 1148  LIPASE 63*   Recent Labs  Lab 01/29/24 1042  AMMONIA 38*   Coagulation Profile: No results for input(s): "INR", "PROTIME" in the last 168 hours. Cardiac Enzymes: Recent Labs  Lab 01/24/24 1148 01/26/24 0346 01/27/24 0357  CKTOTAL 2,194* 4,538* 419*   BNP (last 3 results) No results for input(s): "PROBNP" in the last 8760 hours. HbA1C: No results for input(s): "HGBA1C" in the last 72 hours. CBG: Recent Labs  Lab 01/29/24 2001 01/30/24 0029 01/30/24 0412 01/30/24 0816 01/30/24 1150  GLUCAP 98 101* 101* 88 94   Lipid Profile: Recent Labs    01/28/24 0724  TRIG 278*   Thyroid Function Tests: No results for input(s): "TSH", "T4TOTAL", "FREET4", "T3FREE", "THYROIDAB" in the last 72 hours. Anemia Panel: No results for input(s): "VITAMINB12", "FOLATE", "FERRITIN", "TIBC", "IRON", "RETICCTPCT" in the last 72 hours.  Sepsis Labs: Recent Labs  Lab 01/24/24 1148 01/24/24 1220  PROCALCITON 0.10  --   LATICACIDVEN 1.6 1.0    Recent Results (from the past 240 hours)  Culture, blood (Routine X 2) w Reflex to ID Panel     Status: None   Collection Time: 01/22/24  2:52 PM   Specimen: BLOOD  Result Value Ref Range Status   Specimen Description   Final    BLOOD LEFT ANTECUBITAL Performed at [facility], [address] [place], [place] [address]    Special Requests   Final    BOTTLES DRAWN AEROBIC AND ANAEROBIC Blood Culture results may not be optimal due to an inadequate volume of blood received in  culture bottles Performed at [facility], [address] [place], [place] [address]    Culture   Final    NO GROWTH 5 DAYS Performed at [facility], [address], [place], [place] [address]    Report Status 01/27/2024 FINAL  Final  Culture, blood (Routine X 2) w Reflex to ID Panel     Status: None   Collection Time: 01/22/24  2:52 PM   Specimen: BLOOD  Result Value Ref Range Status   Specimen Description   Final    BLOOD BLOOD LEFT HAND Performed at [facility], [address] [place], [place] [address]    Special Requests   Final    BOTTLES DRAWN AEROBIC ONLY Blood Culture results may not be optimal due to an inadequate volume of blood received in culture bottles Performed at [facility], [address], [place], [place] [address]    Culture   Final    NO GROWTH 5 DAYS Performed at [facility], [address] [place], [place] [address]    Report Status 01/27/2024 FINAL  Final  Culture, Respiratory w Gram Stain     Status: None   Collection Time: 01/23/24  7:50 AM   Specimen: Tracheal Aspirate; Respiratory  Result Value Ref Range Status   Specimen Description   Final    TRACHEAL ASPIRATE Performed at [facility], [address] [place], [place] [address]    Special Requests   Final    NONE Performed at [facility], [address] [place], [place] [address]    Gram Stain   Final    FEW WBC PRESENT, PREDOMINANTLY PMN NO ORGANISMS SEEN    Culture   Final    NO GROWTH 2 DAYS Performed at [facility], [address] [place], [place] [address]    Report Status 01/25/2024 FINAL  Final  Respiratory (~20 pathogens) panel by PCR     Status: None   Collection Time: 01/24/24 11:48 AM   Specimen: Nasopharyngeal Swab; Respiratory  Result Value Ref Range Status   Adenovirus NOT DETECTED NOT DETECTED Final   Coronavirus 229E NOT DETECTED NOT DETECTED  Final    Comment: (NOTE) The Coronavirus on the Respiratory Panel, DOES NOT test for the novel  Coronavirus (2019 nCoV)    Coronavirus HKU1 NOT DETECTED NOT DETECTED Final   Coronavirus NL63 NOT DETECTED NOT DETECTED Final   Coronavirus OC43 NOT DETECTED NOT DETECTED Final   Metapneumovirus NOT DETECTED NOT DETECTED Final   Rhinovirus / Enterovirus NOT DETECTED NOT DETECTED Final   Influenza A NOT DETECTED NOT DETECTED Final   Influenza B NOT DETECTED NOT DETECTED Final   Parainfluenza Virus 1 NOT DETECTED NOT DETECTED Final   Parainfluenza Virus 2 NOT DETECTED NOT DETECTED Final   Parainfluenza Virus 3 NOT DETECTED NOT DETECTED Final   Parainfluenza Virus 4 NOT DETECTED NOT DETECTED Final   Respiratory Syncytial Virus NOT DETECTED NOT DETECTED Final   Bordetella pertussis NOT DETECTED NOT DETECTED Final   Bordetella Parapertussis NOT DETECTED NOT DETECTED Final   Chlamydophila pneumoniae NOT DETECTED NOT DETECTED Final   Mycoplasma pneumoniae NOT DETECTED NOT DETECTED Final    Comment: Performed at [facility], [address], [place], [place] [address]         Radiology Studies: DG Chest 2 View Result Date: 01/30/2024 CLINICAL DATA:  Fever EXAM: CHEST - 2 VIEW COMPARISON:  Chest radiograph dated 01/26/2024 FINDINGS: Lines/tubes: Interval extubation and removal of enteric tube. Lungs: Improved lung volumes. Minimal residual patchy left basilar opacities. Pleura: Decreased trace bilateral pleural effusions. No pneumothorax. Heart/mediastinum: The heart size and mediastinal contours are within normal limits. Bones: No acute osseous abnormality. IMPRESSION: 1. Improved lung volumes with minimal  residual patchy left basilar opacities, likely atelectasis. 2. Decreased trace bilateral pleural effusions. Electronically Signed   By: Limin  Xu M.D.   On: 01/30/2024 08:41        Scheduled Meds:  allopurinol   300 mg Oral Daily   amoxicillin -clavulanate  1 tablet Oral Q12H    atorvastatin   20 mg Oral Daily   carbamazepine   400 mg Oral QHS   docusate sodium   100 mg Oral BID   DULoxetine   40 mg Oral Daily   famotidine   20 mg Oral BID   feeding supplement  237 mL Oral TID BM   folic acid   1 mg Oral Daily   heparin   5,000 Units Subcutaneous Q8H   lisinopril   20 mg Oral Daily   And   hydrochlorothiazide  25 mg Oral Daily   insulin  aspart  0-15 Units Subcutaneous Q4H   lactulose   20 g Oral Daily   levothyroxine   75 mcg Oral QAC breakfast   lidocaine   1 patch Transdermal Q24H   LORazepam   2 mg Oral TID   multivitamin with minerals  1 tablet Oral Daily   propranolol  20 mg Oral TID   sodium chloride  flush  10-40 mL Intracatheter Q12H   thiamine   100 mg Oral Daily   Or   thiamine   100 mg Intravenous Daily   Continuous Infusions:   LOS: 12 days    Time spent: 45 minutes spent on chart review, discussion with nursing staff, consultants, updating family and interview/physical exam; more than 50% of that time was spent in counseling and/or coordination of care.    Enrigue Harvard, DO [facility] Available via Epic secure chat 7am-7pm After these hours, please refer to coverage provider listed on [email or web address] 01/30/2024, 1:10 PM

## 2024-01-30 NOTE — Plan of Care (Signed)
 No acute events overnight. Problem: Education: Goal: Ability to describe self-care measures that may prevent or decrease complications (Diabetes Survival Skills Education) will improve Outcome: Progressing Goal: Individualized Educational Video(s) Outcome: Progressing   Problem: Coping: Goal: Ability to adjust to condition or change in health will improve Outcome: Progressing   Problem: Fluid Volume: Goal: Ability to maintain a balanced intake and output will improve Outcome: Progressing   Problem: Health Behavior/Discharge Planning: Goal: Ability to identify and utilize available resources and services will improve Outcome: Progressing Goal: Ability to manage health-related needs will improve Outcome: Progressing   Problem: Metabolic: Goal: Ability to maintain appropriate glucose levels will improve Outcome: Progressing   Problem: Nutritional: Goal: Maintenance of adequate nutrition will improve Outcome: Progressing Goal: Progress toward achieving an optimal weight will improve Outcome: Progressing   Problem: Skin Integrity: Goal: Risk for impaired skin integrity will decrease Outcome: Progressing   Problem: Tissue Perfusion: Goal: Adequacy of tissue perfusion will improve Outcome: Progressing   Problem: Education: Goal: Knowledge of General Education information will improve Description: Including pain rating scale, medication(s)/side effects and non-pharmacologic comfort measures Outcome: Progressing   Problem: Health Behavior/Discharge Planning: Goal: Ability to manage health-related needs will improve Outcome: Progressing   Problem: Clinical Measurements: Goal: Ability to maintain clinical measurements within normal limits will improve Outcome: Progressing Goal: Will remain free from infection Outcome: Progressing Goal: Diagnostic test results will improve Outcome: Progressing Goal: Respiratory complications will improve Outcome: Progressing Goal:  Cardiovascular complication will be avoided Outcome: Progressing   Problem: Activity: Goal: Risk for activity intolerance will decrease Outcome: Progressing   Problem: Nutrition: Goal: Adequate nutrition will be maintained Outcome: Progressing   Problem: Coping: Goal: Level of anxiety will decrease Outcome: Progressing   Problem: Elimination: Goal: Will not experience complications related to bowel motility Outcome: Progressing Goal: Will not experience complications related to urinary retention Outcome: Progressing   Problem: Pain Managment: Goal: General experience of comfort will improve and/or be controlled Outcome: Progressing   Problem: Safety: Goal: Ability to remain free from injury will improve Outcome: Progressing   Problem: Skin Integrity: Goal: Risk for impaired skin integrity will decrease Outcome: Progressing   Problem: Safety: Goal: Non-violent Restraint(s) Outcome: Progressing   Problem: Activity: Goal: Ability to tolerate increased activity will improve Outcome: Progressing   Problem: Respiratory: Goal: Ability to maintain a clear airway and adequate ventilation will improve Outcome: Progressing   Problem: Role Relationship: Goal: Method of communication will improve Outcome: Progressing

## 2024-01-30 NOTE — Progress Notes (Signed)
 Nutrition Follow-up  INTERVENTION:   -Ensure Plus High Protein po TID, each supplement provides 350 kcal and 20 grams of protein.   -Multivitamin with minerals daily  NUTRITION DIAGNOSIS:   Increased nutrient needs related to chronic illness as evidenced by estimated needs.  Ongoing.  GOAL:   Patient will meet greater than or equal to 90% of their needs  Progressing.  MONITOR:   PO intake, Supplement acceptance  ASSESSMENT:   43 y.o. male history of EtOH abuse, hypertension, hepatic encephalopathy , admitted for altered mental status.  5/21 Admit; Intubated; Cortrak placed - TF initiated 5/22 TF held after patient vomited 5/24 Trickle tube feeds restarted 5/25 TF held in the AM due to vomiting; restarted in the afternoon  5/27 Patient extubated in the AM; TF held in the PM; re-intubated 5/29 extubated, started on clear liquids 5/30 Cortrak removed, Soft diet 5/31 Regular diet  Patient with improved mental status. Now on regular diet. Will order Ensure supplements for additional kcals and protein to aid recovery. Will order daily MVI as well.  Admission weight: 264 lbs Current weight: 220 lbs  Medications: Colace, Pepcid , Folic acid , Lactulose , Thiamine   Labs reviewed: CBGs: 88-101 Low Na Low K  Diet Order:   Diet Order             Diet regular Room service appropriate? Yes; Fluid consistency: Thin  Diet effective now                   EDUCATION NEEDS:   Not appropriate for education at this time  Skin:  Skin Assessment: Reviewed RN Assessment  Last BM:  6/1 -type 7  Height:   Ht Readings from Last 1 Encounters:  01/26/24 5\' 10"  (1.778 m)    Weight:   Wt Readings from Last 1 Encounters:  01/30/24 100 kg    Ideal Body Weight:  75.45 kg  BMI:  Body mass index is 31.63 kg/m.  Estimated Nutritional Needs:   Kcal:  1308-6578  Protein:  115-135g  Fluid:  2.2L/day   Arna Better, MS, RD, LDN Inpatient Clinical  Dietitian Contact via Secure chat

## 2024-01-30 NOTE — Progress Notes (Signed)
 Occupational Therapy Treatment Patient Details Name: Juan Clarke MRN: [id number] DOB: [birth year] Today's Date: 01/30/2024   History of present illness Juan Clarke is a  43 y/o male with PMH for Alcoholism, ETOH abuse, Severe major depression, Trigeminal Neuralgia, HTN, Gout and Hypothyroidism who presented to ED via EMS after he was found passed out on the couch.  He was able to eventually arouse but very confused and not able to effectively communicate.  In the ED patient very diaphoretic and confused.  However, he became increasingly more agitated.  He has received multiples doses of Ativan  and received 200mg  IV Phenobarbital .  However, he had to be placed on 4 point restraints.  His CIWA score was above 21.    ED labs revealed an increased LA 4.1, Ammonia level 125. Admitted with dx Acute ETOH withdraw, ARF, DT's, lactic acidosis.  Pt intubated for several days and extubated 01/26/24   OT comments  Patient seen for skilled OT session this am. Reports some residual pain with inspiration but as per patient current rec is for increasing IS use. Patient also reports having independently taken shower standing level yesterday with use of grab bars. Demonstrated mod I to and from bathroom this session with AD. Min cues for pacing and slower position changes due to prolonged bed rest. OT educated and issued Energy Conservation Handouts as well as simple UE HEP to include breathing integration strategies. Will continue to follow acutely to continue to progress and allow for discharge. Currently, no follow up OT recommended and dispo remains appropriate.       If plan is discharge home, recommend the following:  A little help with walking and/or transfers;A little help with bathing/dressing/bathroom;Assist for transportation;Help with stairs or ramp for entrance   Equipment Recommendations  None recommended by OT       Precautions / Restrictions Precautions Precautions: Fall Restrictions Weight  Bearing Restrictions Per Provider Order: No       Mobility Bed Mobility Overal bed mobility: Modified Independent                  Transfers Overall transfer level: Modified independent           Step pivot transfers: Modified independent (Device/Increase time)     General transfer comment: min cues for pacing for amb to and from bed to toilet, reports stood for shower with use of grab bars yesterday     Balance Overall balance assessment: Needs assistance         Standing balance support: During functional activity Standing balance-Leahy Scale: Fair                             ADL either performed or assessed with clinical judgement   ADL Overall ADL's : Needs assistance/impaired Eating/Feeding: Independent   Grooming: Wash/dry hands;Wash/dry face;Oral care;Sitting;Modified independent   Upper Body Bathing: Modified independent;Sitting   Lower Body Bathing: Supervison/ safety;Sit to/from stand   Upper Body Dressing : Modified independent;Sitting   Lower Body Dressing: Supervision/safety;Sitting/lateral leans   Toilet Transfer: Modified Independent Toilet Transfer Details (indicate cue type and reason): min cues for slower position changes Toileting- Clothing Manipulation and Hygiene: Modified independent;Sitting/lateral lean   Tub/ Shower Transfer: Supervision/safety   Functional mobility during ADLs: Modified independent General ADL Comments: min cues for ECT strategies due to prolonged 13+ days of bedrest    Extremity/Trunk Assessment Upper Extremity Assessment Upper Extremity Assessment: Overall WFL for tasks assessed  Lower Extremity Assessment Lower Extremity Assessment: Defer to PT evaluation        Vision   Vision Assessment?: No apparent visual deficits;Wears glasses for reading;Wears glasses for driving         Communication Communication Communication: No apparent difficulties   Cognition Arousal: Alert Behavior  During Therapy: WFL for tasks assessed/performed Cognition: No apparent impairments                               Following commands: Intact        Cueing   Cueing Techniques: Verbal cues  Exercises Exercises: General Upper Extremity General Exercises - Upper Extremity Elbow Extension: 15 reps, Both, Theraband, Other (comment) (grasp ball 20 reps grasp, pinch, chest press)    Shoulder Instructions       General Comments on RA with 98%    Pertinent Vitals/ Pain       Pain Assessment Pain Assessment: 0-10 Faces Pain Scale: Hurts even more Pain Location: R lung/side region Pain Descriptors / Indicators: Sharp, Aching Pain Intervention(s): Limited activity within patient's tolerance, Monitored during session, Patient requesting pain meds-RN notified, Repositioned, Relaxation   Frequency  Min 2X/week        Progress Toward Goals  OT Goals(current goals can now be found in the care plan section)  Progress towards OT goals: Progressing toward goals  Acute Rehab OT Goals Patient Stated Goal: to help this breathing OT Goal Formulation: With patient Time For Goal Achievement: 02/10/24 Potential to Achieve Goals: Good ADL Goals Pt Will Perform Grooming: with modified independence;sitting Pt Will Perform Upper Body Bathing: with modified independence;sitting Pt Will Perform Lower Body Bathing: with modified independence;sit to/from stand Pt Will Perform Upper Body Dressing: with modified independence;sitting Pt Will Perform Lower Body Dressing: with modified independence;sit to/from stand Pt Will Transfer to Toilet: with modified independence;ambulating Pt Will Perform Tub/Shower Transfer: with supervision;shower seat;grab bars Additional ADL Goal #1: Patient will teach back 4/4 ECTs during ADL's and mobility independently  Plan      Co-evaluation    PT/OT/SLP Co-Evaluation/Treatment: Yes            AM-PAC OT "6 Clicks" Daily Activity     Outcome  Measure   Help from another person eating meals?: None Help from another person taking care of personal grooming?: None Help from another person toileting, which includes using toliet, bedpan, or urinal?: A Little Help from another person bathing (including washing, rinsing, drying)?: A Little Help from another person to put on and taking off regular upper body clothing?: None Help from another person to put on and taking off regular lower body clothing?: A Little 6 Click Score: 21    End of Session Equipment Utilized During Treatment: Gait belt  OT Visit Diagnosis: Unsteadiness on feet (R26.81);Pain   Activity Tolerance Patient tolerated treatment well   Patient Left in bed;with call bell/phone within reach   Nurse Communication Patient requests pain meds        Time: 1110-1140 OT Time Calculation (min): 30 min  Charges: OT General Charges $OT Visit: 1 Visit OT Treatments $Therapeutic Activity: 23-37 mins  Maximino Cozzolino OT/L Acute Rehabilitation Department  [phone]  01/30/2024, 1:17 PM

## 2024-01-30 NOTE — TOC Progression Note (Signed)
 Transition of Care [facility]) - Progression Note    Patient Details  Name: Juan Clarke MRN: [id number] Date of Birth: 01/21/1981  [facility]) CM/SW Contact  Marty Sleet, LCSW Phone Number: 01/30/2024, 11:18 AM  Clinical Narrative:    Resent referral to [facility] with updated progress notes from MD and PT.  Received call from [facility] that pt has tentatively been approved for rehab pending a telephone call intake with patient. Pt provided with phone number [phone]) to call this afternoon to complete intake. [facility] states that patient would need to come door to door. Spoke with pt about transferring to rehab directly from the hospital. Pt agreeable to transfer door to door. CSW inquired if pt could admit tomorrow, Tuesday, however was informed that no admission staff available tomorrow. [facility] able to accept on Wednesday, 6/4 at 12:30pm.      Barriers to Discharge: Continued Medical Work up  Expected Discharge Plan and Services                                               Social Determinants of Health (SDOH) Interventions SDOH Screenings   Food Insecurity: Patient Unable To Answer (01/18/2024)  Housing: Patient Unable To Answer (01/18/2024)  Transportation Needs: Patient Unable To Answer (01/18/2024)  Utilities: Patient Unable To Answer (01/18/2024)  Social Connections: Unknown (01/11/2022)   Received from [facility], [facility]  Tobacco Use: Low Risk  (01/17/2024)    Readmission Risk Interventions    01/20/2024   12:44 PM 01/20/2024    9:59 AM 01/18/2024    3:19 PM  Readmission Risk Prevention Plan  Transportation Screening Complete Complete Complete  PCP or Specialist Appt within 5-7 Days   Complete  PCP or Specialist Appt within 3-5 Days Complete Complete   Home Care Screening   Complete  Medication Review (RN CM)   Complete  HRI or Home Care Consult Complete    Social Work Consult for Recovery Care  Planning/Counseling Complete    Palliative Care Screening  Complete   Medication Review Oceanographer) Complete

## 2024-01-30 NOTE — Discharge Summary (Signed)
 Physician Discharge Summary  Juan Clarke ZOX:[id number] DOB: 1980-12-30 DOA: 01/17/2024  PCP: Perley Bradley, MD  Admit date: 01/17/2024 Discharge date: 01/30/2024  Admitted From: Home Discharge disposition: Home   Recommendations for Outpatient Follow-Up:   Patient originally slated to stay in hospital until Wednesday and go directly to [facility] but apparently having some family issues and elected to leave on  6/2.  In order to facilitate a safe discharge I have prescribed medications for the patient.   Discharge Diagnosis:   Principal Problem:   Alcohol withdrawal delirium (HCC) Active Problems:   Serotonin syndrome    Discharge Condition: Stable  Diet recommendation:   Regular.  Wound care: None.  Code status: Full.   History of Present Illness:   Patient seen in ED.  He is a 43 y/o male with PMH for Alcoholism, ETOH abuse, Severe major depression, Trigeminal Neuralgia, HTN, Gout and Hypothyroidism who presented to ED via EMS after he was found passed out on the cough.  He was able to eventually arouse but very confused and not able to effectively communicate.  In the ED patient very diaphoretic and confused.  However, he became increasingly more agitated.  He has received multiples doses of Ativan  and received 200mg  IV Phenobarbital .  However, he had to be placed on 4 point restraints.  His CIWA score was above 21.   ED labs revealed an increased LA 4.1, Ammonia level 125.   Hospital Course by Problem:   Acute metabolic encephalopathy EtOH withdrawal DTs - Stable - Being weaned off medications Eventual plan was to transition to [facility] but patient having family issues insisting on leaving on 6/2.  Patient states he knows he cannot drink alcohol - Lactulose -titrate to 3 bowel movements per day   There was a concern for serotonin syndrome with him being on multiple medications prior to coming into the hospital including paroxetine ,  duloxetine , carbamazepine , trazodone, dextromethorphan, Vyvanse - Did receive cyproheptadine  - CK normalizing Seroquel  weaned off   Fever - Incentive spirometer - Resumed p.o. antibiotics and monitor    Acute hypoxemic respiratory failure Aspiration pneumonia -Completed 10 days of antibiotics -Weaned to room air   Elevated ESR CRP - Etiology unclear - Autoimmune panel shows positive rheumatoid factor - Outpatient follow-up   Hypertriglyceridemia is improving   High ileus is resolved - Tolerating orally   Hypertension Hyperlipidemia - Resume home meds  Hypokalemia - Replete   Physical deconditioning  -Ambulate as able   Obesity- type 1 Estimated body mass index is 31.63 kg/m as calculated from the following:   Height as of this encounter: 5\' 10"  (1.778 m).   Weight as of this encounter: 100 kg.     Medical Consultants:   PCCM   Discharge Exam:   Vitals:   01/30/24 0944 01/30/24 1153  BP: (!) 141/81 (!) 146/98  Pulse: (!) 102   Resp:  18  Temp:  98.5 F (36.9 C)  SpO2:     Vitals:   01/30/24 0410 01/30/24 0705 01/30/24 0944 01/30/24 1153  BP: (!) 141/81  (!) 141/81 (!) 146/98  Pulse: (!) 102  (!) 102   Resp: 18   18  Temp: 100 F (37.8 C)   98.5 F (36.9 C)  TempSrc:      SpO2: 93%     Weight:  100 kg    Height:        General exam: Appears calm and comfortable.    The results of  significant diagnostics from this hospitalization (including imaging, microbiology, ancillary and laboratory) are listed below for reference.     Procedures and Diagnostic Studies:   DG Abd 1 View Result Date: 01/18/2024 CLINICAL DATA:  NG placement. EXAM: ABDOMEN - 1 VIEW COMPARISON:  Abdominal radiograph dated 01/18/2024. FINDINGS: Feeding tube with tip in the region of the second portion of the duodenum. Air is noted in the stomach. No acute osseous pathology. IMPRESSION: Feeding tube with tip in the region of the second portion of the duodenum.  Electronically Signed   By: Angus Bark M.D.   On: 01/18/2024 13:57   DG Abd Portable 1 View Result Date: 01/18/2024 CLINICAL DATA:  Nasogastric tube placement EXAM: PORTABLE ABDOMEN - 1 VIEW COMPARISON:  None Available. FINDINGS: Nasogastric tube tip overlies the expected distal esophagus at least 15 cm above the gastroesophageal junction. The abdominal gas pattern is indeterminate due to a paucity of intra-abdominal gas. IMPRESSION: 1. Nasogastric tube tip within the distal esophagus at least 15 cm above the gastroesophageal junction. Advancement of the catheter by 20-25 cm would optimally position the device. Electronically Signed   By: Worthy Heads M.D.   On: 01/18/2024 05:54   DG Abd Portable 1V Result Date: 01/18/2024 CLINICAL DATA:  Nasogastric tube placement EXAM: PORTABLE ABDOMEN - 1 VIEW COMPARISON:  Earlier the same day FINDINGS: No visible enteric tube. The epigastrium is incompletely covered. Moderate gaseous distention of the stomach. IMPRESSION: No visible enteric tube. The epigastrium is incompletely covered. Moderate gaseous distention of the stomach. Electronically Signed   By: Ronnette Coke M.D.   On: 01/18/2024 04:52   DG Chest Portable 1 View Result Date: 01/18/2024 CLINICAL DATA:  Check endotracheal tube placement EXAM: PORTABLE CHEST 1 VIEW COMPARISON:  01/17/2024 FINDINGS: Cardiac shadow is enlarged but accentuated by the frontal technique. Endotracheal tube is noted in satisfactory position. The lungs are hypoinflated but clear. IMPRESSION: Tubes and lines as described. Electronically Signed   By: Violeta Grey M.D.   On: 01/18/2024 02:55   DG Abd Portable 1V Result Date: 01/18/2024 CLINICAL DATA:  Check gastric catheter placement EXAM: PORTABLE ABDOMEN - 1 VIEW COMPARISON:  None Available. FINDINGS: Gastric catheter is noted in the distal esophagus. This should be advanced several cm deeper into the stomach. No obstructive changes are seen. IMPRESSION: Gastric catheter  in the distal esophagus. This should be advanced several cm into the abdomen. Electronically Signed   By: Violeta Grey M.D.   On: 01/18/2024 02:54   CT HEAD WO CONTRAST ( ) Result Date: 01/17/2024 CLINICAL DATA:  Altered mental status EXAM: CT HEAD WITHOUT CONTRAST TECHNIQUE: Contiguous axial images were obtained from the base of the skull through the vertex without intravenous contrast. RADIATION DOSE REDUCTION: This exam was performed according to the departmental dose-optimization program which includes automated exposure control, adjustment of the mA and/or kV according to patient size and/or use of iterative reconstruction technique. COMPARISON:  11/28/2022 FINDINGS: Brain: No acute intracranial abnormality. Specifically, no hemorrhage, hydrocephalus, mass lesion, acute infarction, or significant intracranial injury. Vascular: No hyperdense vessel or unexpected calcification. Skull: No acute calvarial abnormality. Sinuses/Orbits: No acute findings Other: None IMPRESSION: No acute intracranial abnormality. Electronically Signed   By: Janeece Mechanic M.D.   On: 01/17/2024 23:35   DG Chest Portable 1 View Result Date: 01/17/2024 CLINICAL DATA:  Altered mental status EXAM: PORTABLE CHEST 1 VIEW COMPARISON:  08/11/2023 FINDINGS: Cardiac shadow is enlarged. The lungs are hypoinflated but clear. No bony abnormality is noted. IMPRESSION: No acute  abnormality noted. Electronically Signed   By: Violeta Grey M.D.   On: 01/17/2024 23:08     Labs:   Basic Metabolic Panel: Recent Labs  Lab 01/25/24 0516 01/26/24 0552 01/26/24 1604 01/27/24 0357 01/28/24 0724 01/29/24 1042 01/30/24 0921  NA 138 136  --  137 134* 133* 131*  K 3.5 3.3*  --  3.6 4.0 3.5 3.4*  CL 103 100  --  106 103 98 97*  CO2 26 28  --  25 23 24 22   GLUCOSE 118* 151*  --  122* 114* 108* 105*  BUN 18 23*  --  16 16 14 18   CREATININE 0.84 1.24  --  0.88 0.97 0.87 1.02  CALCIUM  8.4* 8.3*  --  8.3* 8.3* 8.8* 9.0  MG 2.4 2.5* 2.4 2.4  2.5*  --   --   PHOS 4.9* 5.7* 3.7 3.4 4.5  --   --    GFR Estimated Creatinine Clearance: 113 mL/min (by C-G formula based on SCr of 1.02 mg/dL). Liver Function Tests: Recent Labs  Lab 01/24/24 1148 01/27/24 0357  AST 98* 85*  ALT 68* 122*  ALKPHOS 158* 204*  BILITOT 0.8 0.6  PROT 6.5 6.6  ALBUMIN 2.8* 2.7*   Recent Labs  Lab 01/24/24 1148  LIPASE 63*   Recent Labs  Lab 01/29/24 1042  AMMONIA 38*   Coagulation profile No results for input(s): "INR", "PROTIME" in the last 168 hours.  CBC: Recent Labs  Lab 01/24/24 0535 01/25/24 0516 01/26/24 0552 01/27/24 0357 01/28/24 0724 01/29/24 1042 01/30/24 0921  WBC 6.0   < > 6.6 7.9 9.1 11.3* 13.2*  NEUTROABS 3.7  --   --   --   --   --   --   HGB 9.0*   < > 8.6* 10.2* 10.3* 11.7* 11.9*  HCT 28.9*   < > 27.7* 31.8* 32.0* 35.9* 35.2*  MCV 96.7   < > 97.5 93.3 94.1 91.8 89.3  PLT 204   < > 236 318 268 248 255   < > = values in this interval not displayed.   Cardiac Enzymes: Recent Labs  Lab 01/24/24 1148 01/26/24 0346 01/27/24 0357  CKTOTAL 2,194* 4,538* 419*   BNP: Invalid input(s): "POCBNP" CBG: Recent Labs  Lab 01/29/24 2001 01/30/24 0029 01/30/24 0412 01/30/24 0816 01/30/24 1150  GLUCAP 98 101* 101* 88 94   D-Dimer No results for input(s): "DDIMER" in the last 72 hours. Hgb A1c No results for input(s): "HGBA1C" in the last 72 hours. Lipid Profile Recent Labs    01/28/24 0724  TRIG 278*   Thyroid function studies No results for input(s): "TSH", "T4TOTAL", "T3FREE", "THYROIDAB" in the last 72 hours.  Invalid input(s): "FREET3" Anemia work up No results for input(s): "VITAMINB12", "FOLATE", "FERRITIN", "TIBC", "IRON", "RETICCTPCT" in the last 72 hours. Microbiology Recent Results (from the past 240 hours)  Culture, blood (Routine X 2) w Reflex to ID Panel     Status: None   Collection Time: 01/22/24  2:52 PM   Specimen: BLOOD  Result Value Ref Range Status   Specimen Description   Final     BLOOD LEFT ANTECUBITAL Performed at [facility], [address], [place], [place] [address]    Special Requests   Final    BOTTLES DRAWN AEROBIC AND ANAEROBIC Blood Culture results may not be optimal due to an inadequate volume of blood received in culture bottles Performed at [facility], [address] [place], [place]  [address]    Culture   Final    NO GROWTH 5 DAYS Performed at [facility], [address], [place], [place] [address]    Report Status 01/27/2024 FINAL  Final  Culture, blood (Routine X 2) w Reflex to ID Panel     Status: None   Collection Time: 01/22/24  2:52 PM   Specimen: BLOOD  Result Value Ref Range Status   Specimen Description   Final    BLOOD BLOOD LEFT HAND Performed at [facility], [address], [place], [place] [address]    Special Requests   Final    BOTTLES DRAWN AEROBIC ONLY Blood Culture results may not be optimal due to an inadequate volume of blood received in culture bottles Performed at [facility], [address], [place], [place] [address]    Culture   Final    NO GROWTH 5 DAYS Performed at [facility], [address], [place], [place] [address]    Report Status 01/27/2024 FINAL  Final  Culture, Respiratory w Gram Stain     Status: None   Collection Time: 01/23/24  7:50 AM   Specimen: Tracheal Aspirate; Respiratory  Result Value Ref Range Status   Specimen Description   Final    TRACHEAL ASPIRATE Performed at [facility], [address] [place], [place] [address]    Special Requests   Final    NONE Performed at [facility], [address] [place], [place] [address]    Gram Stain   Final    FEW WBC PRESENT, PREDOMINANTLY PMN NO ORGANISMS SEEN    Culture   Final    NO GROWTH 2 DAYS Performed at [facility], [address], [place], [place] [address]    Report Status 01/25/2024 FINAL   Final  Respiratory (~20 pathogens) panel by PCR     Status: None   Collection Time: 01/24/24 11:48 AM   Specimen: Nasopharyngeal Swab; Respiratory  Result Value Ref Range Status   Adenovirus NOT DETECTED NOT DETECTED Final   Coronavirus 229E NOT DETECTED NOT DETECTED Final    Comment: (NOTE) The Coronavirus on the Respiratory Panel, DOES NOT test for the novel  Coronavirus (2019 nCoV)    Coronavirus HKU1 NOT DETECTED NOT DETECTED Final   Coronavirus NL63 NOT DETECTED NOT DETECTED Final   Coronavirus OC43 NOT DETECTED NOT DETECTED Final   Metapneumovirus NOT DETECTED NOT DETECTED Final   Rhinovirus / Enterovirus NOT DETECTED NOT DETECTED Final   Influenza A NOT DETECTED NOT DETECTED Final   Influenza B NOT DETECTED NOT DETECTED Final   Parainfluenza Virus 1 NOT DETECTED NOT DETECTED Final   Parainfluenza Virus 2 NOT DETECTED NOT DETECTED Final   Parainfluenza Virus 3 NOT DETECTED NOT DETECTED Final   Parainfluenza Virus 4 NOT DETECTED NOT DETECTED Final   Respiratory Syncytial Virus NOT DETECTED NOT DETECTED Final   Bordetella pertussis NOT DETECTED NOT DETECTED Final   Bordetella Parapertussis NOT DETECTED NOT DETECTED Final   Chlamydophila pneumoniae NOT DETECTED NOT DETECTED Final   Mycoplasma pneumoniae NOT DETECTED NOT DETECTED Final    Comment: Performed at [facility], [address], [place], [place] [address]     Discharge Instructions:   Discharge Instructions     Diet general   Complete by: As directed    Discharge instructions   Complete by: As directed    Continue IS Finish abx Report to [facility] [address]  Wednesday   Increase activity slowly   Complete by: As directed       Allergies as of 01/30/2024       Reactions   Lexapro [escitalopram Oxalate] Other (See Comments)   Lack of therapeutic effect    Zoloft [sertraline Hcl] Other (See Comments)   Lack of therapeutic effect         Medication List     TAKE these medications    acetaminophen   325 MG tablet Commonly known as: TYLENOL  Take 325-650 mg by mouth every 6 (six) hours as needed for mild pain (pain score 1-3) or headache.   allopurinol  300 MG tablet Commonly known as: ZYLOPRIM  Take 300 mg by mouth daily.   amoxicillin -clavulanate 875-125 MG tablet Commonly known as: AUGMENTIN Take 1 tablet by mouth every 12 (twelve) hours.   atorvastatin  20 MG tablet Commonly known as: LIPITOR Take 20 mg by mouth daily.   carbamazepine  200 MG tablet Commonly known as: TEGRETOL  Take 2 tablets (400 mg total) by mouth at bedtime. What changed: how much to take   Dextromethorphan HBr 15 MG Caps Take 15 mg by mouth every 8 (eight) hours as needed (for coughing).   diphenhydrAMINE  25 mg capsule Commonly known as: BENADRYL  Take 25 mg by mouth every 6 (six) hours as needed for itching or allergies.   DULoxetine  HCl 40 MG Cpep Take 1 capsule (40 mg total) by mouth daily. Start taking on: January 31, 2024 What changed:  medication strength how much to take additional instructions   ketotifen 0.035 % ophthalmic solution Commonly known as: ZADITOR Place 1 drop into both eyes 2 (two) times daily as needed (for itching).   lactulose  10 GM/15ML solution Commonly known as: CHRONULAC  Take 30 mLs (20 g total) by mouth daily. Start taking on: January 31, 2024   levothyroxine  75 MCG tablet Commonly known as: SYNTHROID  Take 75 mcg by mouth daily before breakfast.   lip balm Oint Apply 1 Application topically as needed for lip care.   lisinopril -hydrochlorothiazide 20-25 MG tablet Commonly known as: ZESTORETIC Take 1 tablet by mouth daily.   LORazepam  1 MG tablet Commonly known as: ATIVAN  Take 1 tablet (1 mg total) by mouth every 8 (eight) hours as needed for anxiety.   methocarbamol 500 MG tablet Commonly known as: ROBAXIN Take 500 mg by mouth every 8 (eight) hours as needed for muscle spasms.   Pepcid  Complete 10-800-165 MG chewable tablet Generic drug: famotidine -calcium   carbonate-magnesium  hydroxide Chew 1 tablet by mouth daily as needed (for heartburn or reflux).   propranolol 20 MG tablet Commonly known as: INDERAL Take 20 mg by mouth 3 (three) times daily.   thiamine  100 MG tablet Commonly known as: Vitamin B-1 Take 1 tablet (100 mg total) by mouth daily. Start taking on: January 31, 2024   traZODone 50 MG tablet Commonly known as: DESYREL Take 50 mg by mouth at bedtime as needed for sleep.        Follow-up Information     [facility] Go on 02/01/2024.   Why: You have an admission to [facility] for rehab on 02/01/24 at 12:30pm. Contact information: [address] [place] [address] [phone]                  Time coordinating discharge: 45 min  Signed:  Enrigue Harvard DO  [facility] 01/30/2024, 4:01 PM

## 2024-01-31 ENCOUNTER — Encounter (HOSPITAL_COMMUNITY): Payer: Self-pay

## 2024-01-31 ENCOUNTER — Inpatient Hospital Stay (HOSPITAL_COMMUNITY)

## 2024-01-31 ENCOUNTER — Emergency Department (HOSPITAL_COMMUNITY)

## 2024-01-31 DIAGNOSIS — Z6836 Body mass index (BMI) 36.0-36.9, adult: Secondary | ICD-10-CM | POA: Diagnosis not present

## 2024-01-31 DIAGNOSIS — E039 Hypothyroidism, unspecified: Secondary | ICD-10-CM | POA: Diagnosis present

## 2024-01-31 DIAGNOSIS — F1011 Alcohol abuse, in remission: Secondary | ICD-10-CM | POA: Diagnosis present

## 2024-01-31 DIAGNOSIS — I2699 Other pulmonary embolism without acute cor pulmonale: Secondary | ICD-10-CM | POA: Diagnosis not present

## 2024-01-31 DIAGNOSIS — E782 Mixed hyperlipidemia: Secondary | ICD-10-CM | POA: Diagnosis present

## 2024-01-31 DIAGNOSIS — Z86711 Personal history of pulmonary embolism: Secondary | ICD-10-CM | POA: Diagnosis not present

## 2024-01-31 DIAGNOSIS — I82433 Acute embolism and thrombosis of popliteal vein, bilateral: Secondary | ICD-10-CM | POA: Diagnosis present

## 2024-01-31 DIAGNOSIS — R9431 Abnormal electrocardiogram [ECG] [EKG]: Secondary | ICD-10-CM | POA: Diagnosis present

## 2024-01-31 DIAGNOSIS — I10 Essential (primary) hypertension: Secondary | ICD-10-CM | POA: Diagnosis present

## 2024-01-31 DIAGNOSIS — E66812 Obesity, class 2: Secondary | ICD-10-CM | POA: Diagnosis present

## 2024-01-31 DIAGNOSIS — I700 Atherosclerosis of aorta: Secondary | ICD-10-CM | POA: Diagnosis present

## 2024-01-31 DIAGNOSIS — N179 Acute kidney failure, unspecified: Secondary | ICD-10-CM | POA: Diagnosis present

## 2024-01-31 DIAGNOSIS — R7989 Other specified abnormal findings of blood chemistry: Secondary | ICD-10-CM | POA: Diagnosis present

## 2024-01-31 DIAGNOSIS — Z888 Allergy status to other drugs, medicaments and biological substances status: Secondary | ICD-10-CM | POA: Diagnosis not present

## 2024-01-31 DIAGNOSIS — F411 Generalized anxiety disorder: Secondary | ICD-10-CM | POA: Diagnosis present

## 2024-01-31 DIAGNOSIS — R7401 Elevation of levels of liver transaminase levels: Secondary | ICD-10-CM | POA: Diagnosis present

## 2024-01-31 DIAGNOSIS — I2602 Saddle embolus of pulmonary artery with acute cor pulmonale: Secondary | ICD-10-CM | POA: Diagnosis not present

## 2024-01-31 DIAGNOSIS — R748 Abnormal levels of other serum enzymes: Secondary | ICD-10-CM | POA: Insufficient documentation

## 2024-01-31 DIAGNOSIS — F1020 Alcohol dependence, uncomplicated: Secondary | ICD-10-CM | POA: Insufficient documentation

## 2024-01-31 DIAGNOSIS — Z813 Family history of other psychoactive substance abuse and dependence: Secondary | ICD-10-CM | POA: Diagnosis not present

## 2024-01-31 DIAGNOSIS — R509 Fever, unspecified: Secondary | ICD-10-CM | POA: Diagnosis present

## 2024-01-31 DIAGNOSIS — I82453 Acute embolism and thrombosis of peroneal vein, bilateral: Secondary | ICD-10-CM | POA: Diagnosis present

## 2024-01-31 DIAGNOSIS — Z808 Family history of malignant neoplasm of other organs or systems: Secondary | ICD-10-CM | POA: Diagnosis not present

## 2024-01-31 DIAGNOSIS — I2609 Other pulmonary embolism with acute cor pulmonale: Secondary | ICD-10-CM | POA: Diagnosis present

## 2024-01-31 DIAGNOSIS — Z79899 Other long term (current) drug therapy: Secondary | ICD-10-CM | POA: Diagnosis not present

## 2024-01-31 DIAGNOSIS — Z8249 Family history of ischemic heart disease and other diseases of the circulatory system: Secondary | ICD-10-CM | POA: Diagnosis not present

## 2024-01-31 DIAGNOSIS — F32A Depression, unspecified: Secondary | ICD-10-CM | POA: Diagnosis present

## 2024-01-31 DIAGNOSIS — I82431 Acute embolism and thrombosis of right popliteal vein: Secondary | ICD-10-CM | POA: Diagnosis present

## 2024-01-31 DIAGNOSIS — E66813 Obesity, class 3: Secondary | ICD-10-CM | POA: Insufficient documentation

## 2024-01-31 DIAGNOSIS — Z818 Family history of other mental and behavioral disorders: Secondary | ICD-10-CM | POA: Diagnosis not present

## 2024-01-31 DIAGNOSIS — Z79890 Hormone replacement therapy (postmenopausal): Secondary | ICD-10-CM | POA: Diagnosis not present

## 2024-01-31 LAB — BASIC METABOLIC PANEL WITH GFR
Anion gap: 14 (ref 5–15)
BUN: 22 mg/dL — ABNORMAL HIGH (ref 6–20)
CO2: 20 mmol/L — ABNORMAL LOW (ref 22–32)
Calcium: 9.2 mg/dL (ref 8.9–10.3)
Chloride: 95 mmol/L — ABNORMAL LOW (ref 98–111)
Creatinine, Ser: 0.96 mg/dL (ref 0.61–1.24)
GFR, Estimated: >60 mL/min (ref >60)
Glucose, Bld: 112 mg/dL — ABNORMAL HIGH (ref 70–99)
Potassium: 3.8 mmol/L (ref 3.5–5.1)
Sodium: 129 mmol/L — ABNORMAL LOW (ref 135–145)

## 2024-01-31 LAB — URINALYSIS, W/ REFLEX TO CULTURE (INFECTION SUSPECTED)
Bacteria, UA: NONE SEEN
Bilirubin Urine: NEGATIVE
Glucose, UA: NEGATIVE mg/dL
Hgb urine dipstick: NEGATIVE
Ketones, ur: NEGATIVE mg/dL
Leukocytes,Ua: NEGATIVE
Nitrite: NEGATIVE
Protein, ur: NEGATIVE mg/dL
Specific Gravity, Urine: 1.035 — ABNORMAL HIGH (ref 1.005–1.030)
pH: 5 (ref 5.0–8.0)

## 2024-01-31 LAB — COMPREHENSIVE METABOLIC PANEL WITH GFR
ALT: 239 U/L — ABNORMAL HIGH (ref 0–44)
AST: 126 U/L — ABNORMAL HIGH (ref 15–41)
Albumin: 4 g/dL (ref 3.5–5.0)
Alkaline Phosphatase: 284 U/L — ABNORMAL HIGH (ref 38–126)
Anion gap: 13 (ref 5–15)
BUN: 22 mg/dL — ABNORMAL HIGH (ref 6–20)
CO2: 21 mmol/L — ABNORMAL LOW (ref 22–32)
Calcium: 9.2 mg/dL (ref 8.9–10.3)
Chloride: 94 mmol/L — ABNORMAL LOW (ref 98–111)
Creatinine, Ser: 1.32 mg/dL — ABNORMAL HIGH (ref 0.61–1.24)
GFR, Estimated: >60 mL/min (ref >60)
Glucose, Bld: 111 mg/dL — ABNORMAL HIGH (ref 70–99)
Potassium: 3.7 mmol/L (ref 3.5–5.1)
Sodium: 128 mmol/L — ABNORMAL LOW (ref 135–145)
Total Bilirubin: 0.9 mg/dL (ref 0.0–1.2)
Total Protein: 8.1 g/dL (ref 6.5–8.1)

## 2024-01-31 LAB — DIFFERENTIAL
Abs Immature Granulocytes: 0.18 10*3/uL — ABNORMAL HIGH (ref 0.00–0.07)
Basophils Absolute: 0.2 10*3/uL — ABNORMAL HIGH (ref 0.0–0.1)
Basophils Relative: 1 %
Eosinophils Absolute: 0.2 10*3/uL (ref 0.0–0.5)
Eosinophils Relative: 2 %
Immature Granulocytes: 1 %
Lymphocytes Relative: 13 %
Lymphs Abs: 1.6 10*3/uL (ref 0.7–4.0)
Monocytes Absolute: 1.1 10*3/uL — ABNORMAL HIGH (ref 0.1–1.0)
Monocytes Relative: 9 %
Neutro Abs: 9.4 10*3/uL — ABNORMAL HIGH (ref 1.7–7.7)
Neutrophils Relative %: 74 %

## 2024-01-31 LAB — RESP PANEL BY RT-PCR (RSV, FLU A&B, COVID)  RVPGX2
Influenza A by PCR: NEGATIVE
Influenza B by PCR: NEGATIVE
Resp Syncytial Virus by PCR: NEGATIVE
SARS Coronavirus 2 by RT PCR: NEGATIVE

## 2024-01-31 LAB — CK: Total CK: 112 U/L (ref 49–397)

## 2024-01-31 LAB — I-STAT CG4 LACTIC ACID, ED: Lactic Acid, Venous: 0.8 mmol/L (ref 0.5–1.9)

## 2024-01-31 LAB — TROPONIN I (HIGH SENSITIVITY)
Troponin I (High Sensitivity): 5 ng/L (ref <18)
Troponin I (High Sensitivity): 8 ng/L (ref <18)

## 2024-01-31 LAB — PHOSPHORUS: Phosphorus: 4.4 mg/dL (ref 2.5–4.6)

## 2024-01-31 LAB — PROTIME-INR
INR: 1.2 (ref 0.8–1.2)
Prothrombin Time: 15.7 s — ABNORMAL HIGH (ref 11.4–15.2)

## 2024-01-31 LAB — MAGNESIUM: Magnesium: 2.3 mg/dL (ref 1.7–2.4)

## 2024-01-31 LAB — HEPARIN LEVEL (UNFRACTIONATED): Heparin Unfractionated: <0.1 [IU]/mL — ABNORMAL LOW (ref 0.30–0.70)

## 2024-01-31 MED ORDER — CARVEDILOL 6.25 MG PO TABS
6.2500 mg | ORAL_TABLET | Freq: Two times a day (BID) | ORAL | Status: DC
  Administered 2024-01-31 – 2024-02-04 (×8): 6.25 mg via ORAL
  Filled 2024-01-31 (×3): qty 1
  Filled 2024-01-31: qty 2
  Filled 2024-01-31 (×4): qty 1

## 2024-01-31 MED ORDER — ALLOPURINOL 300 MG PO TABS
300.0000 mg | ORAL_TABLET | Freq: Every day | ORAL | Status: DC
  Administered 2024-01-31 – 2024-02-04 (×5): 300 mg via ORAL
  Filled 2024-01-31: qty 3
  Filled 2024-01-31: qty 1
  Filled 2024-01-31 (×2): qty 3
  Filled 2024-01-31: qty 1
  Filled 2024-01-31: qty 3

## 2024-01-31 MED ORDER — LACTATED RINGERS IV SOLN: INTRAVENOUS | Status: DC

## 2024-01-31 MED ORDER — METHOCARBAMOL 500 MG PO TABS
500.0000 mg | ORAL_TABLET | Freq: Once | ORAL | Status: AC
  Administered 2024-01-31: 500 mg via ORAL
  Filled 2024-01-31: qty 1

## 2024-01-31 MED ORDER — VANCOMYCIN HCL 2000 MG/400ML IV SOLN
2000.0000 mg | Freq: Once | INTRAVENOUS | Status: AC
  Administered 2024-01-31: 2000 mg via INTRAVENOUS
  Filled 2024-01-31: qty 400

## 2024-01-31 MED ORDER — LACTATED RINGERS IV BOLUS
1000.0000 mL | Freq: Once | INTRAVENOUS | Status: AC
  Administered 2024-01-31: 1000 mL via INTRAVENOUS

## 2024-01-31 MED ORDER — HEPARIN BOLUS VIA INFUSION
4000.0000 [IU] | Freq: Once | INTRAVENOUS | Status: AC
  Administered 2024-01-31: 4000 [IU] via INTRAVENOUS
  Filled 2024-01-31: qty 4000

## 2024-01-31 MED ORDER — MORPHINE SULFATE (PF) 4 MG/ML IV SOLN
4.0000 mg | Freq: Once | INTRAVENOUS | Status: AC
  Administered 2024-01-31: 4 mg via INTRAVENOUS
  Filled 2024-01-31: qty 1

## 2024-01-31 MED ORDER — HYDROMORPHONE HCL 1 MG/ML IJ SOLN
1.0000 mg | INTRAMUSCULAR | Status: AC | PRN
  Administered 2024-01-31 (×3): 1 mg via INTRAVENOUS
  Filled 2024-01-31 (×3): qty 1

## 2024-01-31 MED ORDER — LACTATED RINGERS IV BOLUS (SEPSIS): 500.0000 mL | Freq: Once | INTRAVENOUS | Status: DC

## 2024-01-31 MED ORDER — CARBAMAZEPINE 200 MG PO TABS
400.0000 mg | ORAL_TABLET | Freq: Every day | ORAL | Status: DC
  Administered 2024-01-31 – 2024-02-02 (×3): 400 mg via ORAL
  Filled 2024-01-31 (×3): qty 2

## 2024-01-31 MED ORDER — FENTANYL CITRATE PF 50 MCG/ML IJ SOSY
50.0000 ug | PREFILLED_SYRINGE | Freq: Once | INTRAMUSCULAR | Status: AC
  Administered 2024-01-31: 50 ug via INTRAVENOUS
  Filled 2024-01-31: qty 1

## 2024-01-31 MED ORDER — LACTATED RINGERS IV BOLUS (SEPSIS): 1000.0000 mL | Freq: Once | INTRAVENOUS | Status: DC

## 2024-01-31 MED ORDER — METOPROLOL TARTRATE 5 MG/5ML IV SOLN
5.0000 mg | Freq: Once | INTRAVENOUS | Status: AC
  Administered 2024-01-31: 5 mg via INTRAVENOUS
  Filled 2024-01-31: qty 5

## 2024-01-31 MED ORDER — ACETAMINOPHEN 325 MG PO TABS
325.0000 mg | ORAL_TABLET | Freq: Once | ORAL | Status: AC | PRN
  Administered 2024-01-31: 325 mg via ORAL
  Filled 2024-01-31: qty 1

## 2024-01-31 MED ORDER — LACTATED RINGERS IV BOLUS (SEPSIS)
1000.0000 mL | Freq: Once | INTRAVENOUS | Status: AC
  Administered 2024-01-31: 1000 mL via INTRAVENOUS

## 2024-01-31 MED ORDER — LEVOTHYROXINE SODIUM 75 MCG PO TABS
75.0000 ug | ORAL_TABLET | Freq: Every day | ORAL | Status: DC
  Administered 2024-02-01 – 2024-02-04 (×4): 75 ug via ORAL
  Filled 2024-01-31 (×4): qty 1

## 2024-01-31 MED ORDER — POTASSIUM CHLORIDE CRYS ER 20 MEQ PO TBCR
40.0000 meq | EXTENDED_RELEASE_TABLET | Freq: Once | ORAL | Status: AC
  Administered 2024-01-31: 40 meq via ORAL
  Filled 2024-01-31: qty 2

## 2024-01-31 MED ORDER — KETOROLAC TROMETHAMINE 15 MG/ML IJ SOLN
15.0000 mg | Freq: Once | INTRAMUSCULAR | Status: AC
  Administered 2024-01-31: 15 mg via INTRAVENOUS
  Filled 2024-01-31: qty 1

## 2024-01-31 MED ORDER — OXYCODONE HCL 5 MG PO TABS
5.0000 mg | ORAL_TABLET | ORAL | Status: DC | PRN
  Administered 2024-01-31 – 2024-02-01 (×4): 5 mg via ORAL
  Filled 2024-01-31 (×4): qty 1

## 2024-01-31 MED ORDER — PROCHLORPERAZINE EDISYLATE 10 MG/2ML IJ SOLN
10.0000 mg | Freq: Four times a day (QID) | INTRAMUSCULAR | Status: DC | PRN
  Administered 2024-02-01: 10 mg via INTRAVENOUS
  Filled 2024-01-31: qty 2

## 2024-01-31 MED ORDER — VANCOMYCIN HCL IN DEXTROSE 1-5 GM/200ML-% IV SOLN: 1000.0000 mg | Freq: Once | INTRAVENOUS | Status: DC

## 2024-01-31 MED ORDER — ORAL CARE MOUTH RINSE: 15.0000 mL | OROMUCOSAL | Status: DC | PRN

## 2024-01-31 MED ORDER — HEPARIN (PORCINE) 25000 UT/250ML-% IV SOLN
2300.0000 [IU]/h | INTRAVENOUS | Status: DC
  Administered 2024-01-31: 2100 [IU]/h via INTRAVENOUS
  Administered 2024-01-31: 1650 [IU]/h via INTRAVENOUS
  Administered 2024-02-01: 2500 [IU]/h via INTRAVENOUS
  Administered 2024-02-01: 2200 [IU]/h via INTRAVENOUS
  Filled 2024-01-31 (×4): qty 250

## 2024-01-31 MED ORDER — METHOCARBAMOL 500 MG PO TABS: 500.0000 mg | ORAL_TABLET | Freq: Three times a day (TID) | ORAL | Status: DC | PRN

## 2024-01-31 MED ORDER — METHOCARBAMOL 500 MG PO TABS
500.0000 mg | ORAL_TABLET | Freq: Four times a day (QID) | ORAL | Status: AC | PRN
  Administered 2024-01-31 (×2): 500 mg via ORAL
  Filled 2024-01-31 (×2): qty 1

## 2024-01-31 MED ORDER — HEPARIN BOLUS VIA INFUSION
2700.0000 [IU] | Freq: Once | INTRAVENOUS | Status: AC
  Administered 2024-01-31: 2700 [IU] via INTRAVENOUS
  Filled 2024-01-31: qty 2700

## 2024-01-31 MED ORDER — LORAZEPAM 1 MG PO TABS
1.0000 mg | ORAL_TABLET | Freq: Three times a day (TID) | ORAL | Status: AC | PRN
  Administered 2024-01-31 (×2): 1 mg via ORAL
  Filled 2024-01-31 (×2): qty 1

## 2024-01-31 MED ORDER — SODIUM CHLORIDE 0.9 % IV SOLN
2.0000 g | Freq: Once | INTRAVENOUS | Status: AC
  Administered 2024-01-31: 2 g via INTRAVENOUS
  Filled 2024-01-31: qty 12.5

## 2024-01-31 MED ORDER — CHLORHEXIDINE GLUCONATE CLOTH 2 % EX PADS
6.0000 | MEDICATED_PAD | Freq: Every day | CUTANEOUS | Status: DC
Start: 2024-01-31 — End: 2024-02-04
  Administered 2024-01-31 – 2024-02-01 (×2): 6 via TOPICAL

## 2024-01-31 MED ORDER — IOHEXOL 350 MG/ML SOLN
75.0000 mL | Freq: Once | INTRAVENOUS | Status: AC | PRN
  Administered 2024-01-31: 75 mL via INTRAVENOUS

## 2024-01-31 MED ORDER — ACETAMINOPHEN 650 MG RE SUPP: 325.0000 mg | Freq: Once | RECTAL | Status: AC | PRN

## 2024-01-31 MED ORDER — HYDROMORPHONE HCL 1 MG/ML IJ SOLN
0.5000 mg | INTRAMUSCULAR | Status: DC | PRN
  Administered 2024-01-31 – 2024-02-01 (×2): 1 mg via INTRAVENOUS
  Filled 2024-01-31 (×2): qty 1

## 2024-01-31 MED ORDER — KETOROLAC TROMETHAMINE 15 MG/ML IJ SOLN: 15.0000 mg | Freq: Once | INTRAMUSCULAR | Status: DC

## 2024-01-31 NOTE — ED Provider Notes (Signed)
 718 case d/w Dr. Bonita Bussing, of [facility].  Please start O2.  Dr. Bonita Bussing will see for admission    Jiraiya Mcewan, MD 01/31/24 1610

## 2024-01-31 NOTE — ED Provider Notes (Signed)
 [place] EMERGENCY DEPARTMENT AT [facility] Provider Note   CSN: [id number] Arrival date & time: 01/30/24  2302     History  Chief Complaint  Patient presents with   Chest Pain   Shortness of Breath    Juan Clarke is a 43 y.o. male.  Comes for chest pain and shortness of breath since discharged about 12 hours prior to arrival.  Had been admitted for DTs and concern of possible serotonin syndrome, states he was in the ICU and was intubated twice.  Developed chest pain the day before he was discharged, states he had a chest x-ray that looked okay but states that he did not have the chest pain addressed on his discharge, went home and his symptoms worsened.  He is having pain in [place] take "little baby breaths".  Denies lower extremity pain or swelling.  Denies cough, states he has had low-grade fever.   Chest Pain Associated symptoms: shortness of breath   Shortness of Breath Associated symptoms: chest pain        Home Medications Prior to Admission medications   Medication Sig Start Date End Date Taking? Authorizing Provider  acetaminophen  (TYLENOL ) 325 MG tablet Take 325-650 mg by mouth every 6 (six) hours as needed for mild pain (pain score 1-3) or headache.    [provider]  allopurinol  (ZYLOPRIM ) 300 MG tablet Take 300 mg by mouth daily.    [provider]  amoxicillin -clavulanate (AUGMENTIN) 875-125 MG tablet Take 1 tablet by mouth every 12 (twelve) hours. 01/30/24   Vann, Jessica U, DO  atorvastatin  (LIPITOR) 20 MG tablet Take 20 mg by mouth daily.    [provider]  carbamazepine  (TEGRETOL ) 200 MG tablet Take 2 tablets (400 mg total) by mouth at bedtime. 01/30/24   Vann, Jessica U, DO  Dextromethorphan HBr 15 MG CAPS Take 15 mg by mouth every 8 (eight) hours as needed (for coughing).    [provider]  diphenhydrAMINE  (BENADRYL ) 25 mg capsule Take 25 mg by mouth every 6 (six) hours as needed for itching or allergies.     [provider]  DULoxetine  (CYMBALTA ) 20 MG capsule Take 2 capsules (40 mg total) by mouth daily. 01/31/24   Vann, Jessica U, DO  ketotifen (ZADITOR) 0.035 % ophthalmic solution Place 1 drop into both eyes 2 (two) times daily as needed (for itching).    [provider]  lactulose  (CHRONULAC ) 10 GM/15ML solution Take 30 mLs (20 g total) by mouth daily. 01/31/24   Vann, Jessica U, DO  levothyroxine  (SYNTHROID ) 75 MCG tablet Take 75 mcg by mouth daily before breakfast. 12/21/23   [provider]  lip balm (BLISTEX) OINT Apply 1 Application topically as needed for lip care.    [provider]  lisinopril -hydrochlorothiazide (ZESTORETIC) 20-25 MG tablet Take 1 tablet by mouth daily. 12/15/18   [provider]  LORazepam  (ATIVAN ) 1 MG tablet Take 1 tablet (1 mg total) by mouth every 8 (eight) hours as needed for anxiety. 01/30/24   Vann, Jessica U, DO  methocarbamol (ROBAXIN) 500 MG tablet Take 500 mg by mouth every 8 (eight) hours as needed for muscle spasms.    [provider]  PEPCID  COMPLETE [phone] MG chewable tablet Chew 1 tablet by mouth daily as needed (for heartburn or reflux).    [provider]  propranolol (INDERAL) 20 MG tablet Take 20 mg by mouth 3 (three) times daily.    [provider]  thiamine  (VITAMIN B-1) 100  MG tablet Take 1 tablet (100 mg total) by mouth daily. 01/31/24   Vann, Jessica U, DO  traZODone (DESYREL) 50 MG tablet Take 50 mg by mouth at bedtime as needed for sleep.    [provider]      Allergies    Lexapro [escitalopram oxalate] and Zoloft [sertraline hcl]    Review of Systems   Review of Systems  Respiratory:  Positive for shortness of breath.   Cardiovascular:  Positive for chest pain.    Physical Exam Updated Vital Signs BP 121/75   Pulse (!) 125   Temp 100.2 F (37.9 C) (Oral)   Resp (!) 22   SpO2 96%  Physical Exam Vitals and nursing note reviewed.  Constitutional:       General: He is not in acute distress.    Appearance: He is well-developed.  HENT:     Head: Normocephalic and atraumatic.  Eyes:     Conjunctiva/sclera: Conjunctivae normal.  Cardiovascular:     Rate and Rhythm: Normal rate and regular rhythm.     Heart sounds: No murmur heard. Pulmonary:     Effort: Pulmonary effort is normal. No respiratory distress.     Breath sounds: Normal breath sounds.  Abdominal:     Palpations: Abdomen is soft.     Tenderness: There is no abdominal tenderness.  Musculoskeletal:        General: No swelling. Normal range of motion.     Cervical back: Neck supple.     Right lower leg: No tenderness. No edema.     Left lower leg: No tenderness. No edema.  Skin:    General: Skin is warm and dry.     Capillary Refill: Capillary refill takes less than 2 seconds.  Neurological:     Mental Status: He is alert.  Psychiatric:        Mood and Affect: Mood normal.     ED Results / Procedures / Treatments   Labs (all labs ordered are listed, but only abnormal results are displayed) Labs Reviewed  BASIC METABOLIC PANEL WITH GFR - Abnormal; Notable for the following components:      Result Value   Sodium 129 (*)    Chloride 95 (*)    CO2 20 (*)    Glucose, Bld 112 (*)    BUN 22 (*)    All other components within normal limits  CBC - Abnormal; Notable for the following components:   WBC 13.3 (*)    Hemoglobin 12.7 (*)    HCT 37.5 (*)    All other components within normal limits  COMPREHENSIVE METABOLIC PANEL WITH GFR - Abnormal; Notable for the following components:   Sodium 128 (*)    Chloride 94 (*)    CO2 21 (*)    Glucose, Bld 111 (*)    BUN 22 (*)    Creatinine, Ser 1.32 (*)    AST 126 (*)    ALT 239 (*)    Alkaline Phosphatase 284 (*)    All other components within normal limits  PROTIME-INR - Abnormal; Notable for the following components:   Prothrombin Time 15.7 (*)    All other components within normal limits  DIFFERENTIAL - Abnormal;  Notable for the following components:   Neutro Abs 9.4 (*)    Monocytes Absolute 1.1 (*)    Basophils Absolute 0.2 (*)    Abs Immature Granulocytes 0.18 (*)    All other components within normal limits  CULTURE, BLOOD (ROUTINE X 2)  CULTURE, BLOOD (ROUTINE X 2)  RESP PANEL BY RT-PCR (RSV, FLU A&B, COVID)  RVPGX2  URINALYSIS, W/ REFLEX TO CULTURE (INFECTION SUSPECTED)  CK  I-STAT CG4 LACTIC ACID, ED  TROPONIN I (HIGH SENSITIVITY)  TROPONIN I (HIGH SENSITIVITY)    EKG None  Radiology DG Chest 2 View Result Date: 01/30/2024 CLINICAL DATA:  Chest pain.  Right-sided chest pain for 24 hours. EXAM: CHEST - 2 VIEW COMPARISON:  01/30/2024 FINDINGS: Shallow inspiration. Cardiac enlargement with mild pulmonary vascular congestion. Small bilateral pleural effusions. No airspace disease or consolidation. No pneumothorax. Mediastinal contours appear intact. IMPRESSION: Cardiac enlargement with pulmonary vascular congestion and small bilateral pleural effusions. No edema or consolidation is identified. Electronically Signed   By: Boyce Byes M.D.   On: 01/30/2024 23:58   DG Chest 2 View Result Date: 01/30/2024 CLINICAL DATA:  Fever EXAM: CHEST - 2 VIEW COMPARISON:  Chest radiograph dated 01/26/2024 FINDINGS: Lines/tubes: Interval extubation and removal of enteric tube. Lungs: Improved lung volumes. Minimal residual patchy left basilar opacities. Pleura: Decreased trace bilateral pleural effusions. No pneumothorax. Heart/mediastinum: The heart size and mediastinal contours are within normal limits. Bones: No acute osseous abnormality. IMPRESSION: 1. Improved lung volumes with minimal residual patchy left basilar opacities, likely atelectasis. 2. Decreased trace bilateral pleural effusions. Electronically Signed   By: Limin  Xu M.D.   On: 01/30/2024 08:41    Procedures .Critical Care  Performed by: Aimee Houseman, PA-C Authorized by: Aimee Houseman, PA-C   Critical care provider statement:     Critical care time (minutes):  35   Critical care time was exclusive of:  Separately billable procedures and treating other patients   Critical care was necessary to treat or prevent imminent or life-threatening deterioration of the following conditions: PE with pulmonary infarction and right heart strain.   Critical care was time spent personally by me on the following activities:  Development of treatment plan with patient or surrogate, discussions with consultants, evaluation of patient's response to treatment, examination of patient, ordering and review of laboratory studies, ordering and review of radiographic studies, ordering and performing treatments and interventions, pulse oximetry, re-evaluation of patient's condition and review of old charts   I assumed direction of critical care for this patient from another provider in my specialty: no     Care discussed with: admitting provider       Medications Ordered in ED Medications  lactated ringers  bolus 1,000 mL (1,000 mLs Intravenous New Bag/Given 01/31/24 0438)    ED Course/ Medical Decision Making/ A&P                                 Medical Decision Making This patient presents to the ED for concern of chest pain, shortness of breath, pleurisy, this involves an extensive number of treatment options, and is a complaint that carries with it a high risk of complications and morbidity.  The differential diagnosis includes ACS, PE, pneumonia, sepsis, other   Co morbidities that complicate the patient evaluation :  HX alcohol withdrawal   Additional history obtained:  Additional history obtained from EMR External records from outside source obtained and reviewed including prior notes, labs   Lab Tests:  I Ordered, and personally interpreted labs.  The pertinent results include: CBC-leukocytosis 13.3, BMP with moderate hyponatremia, elevated LFTs   Imaging Studies ordered:  I ordered imaging studies including chest x-ray which  shows bilateral pleural effusions, CT angio  PE shows PE and right main pulmonary artery distally, evidence of right pulmonary infarction I independently visualized and interpreted imaging within scope of identifying emergent findings  I agree with the radiologist interpretation   Cardiac Monitoring: / EKG:  The patient was maintained on a cardiac monitor.  I personally viewed and interpreted the cardiac monitored which showed an underlying rhythm of: Long QT, sinus rhythm with tachycardia   Consultations Obtained:  I requested consultation with the hospitalist,  and discussed lab and imaging findings as well as pertinent plan - they recommend: To hospitalist with heparin    Problem List / ED Course / Critical interventions / Medication management  Chest pain and shortness of breath-presented with right-sided chest pain shortness of breath, started while he was hospitalized, he checked in around 11 PM on 6/2, had been discharged earlier that day, states pain started on 6/1 while he was hospitalized.  He had a chest x-ray that was normal, he states he was having some pain today, had been getting Dilaudid .  When he got home the medication wore off and he started having severe pain, not able to take full deep breaths due to the significant discomfort.  He checked back and was noted to be significant tachycardic.  Not hypoxic but looked unwell, and had temperature of 100.2.  Also noted to have leukocytosis.  Given vital signs and recent intubation, we covered with antibiotics for possible abscess due to pneumonia and/or CTA to rule out PE as well.  CT did show a large PE.  Will be started heparin , patient is agreeable with plan of admission.  Multiple doses of morphine, pain mildly improved but he still uncomfortable.  I have reviewed the patients home medicines and have made adjustments as needed       Amount and/or Complexity of Data Reviewed Labs: ordered. Radiology:  ordered.  Risk Prescription drug management. Decision regarding hospitalization.           Final Clinical Impression(s) / ED Diagnoses Final diagnoses:  None    Rx / DC Orders ED Discharge Orders     None         Joshua Nieves 01/31/24 1610    Palumbo, April, MD 01/31/24 2304

## 2024-01-31 NOTE — Progress Notes (Signed)
 PHARMACY - ANTICOAGULATION CONSULT NOTE  Pharmacy Consult for heparin  Indication: pulmonary embolus  Allergies  Allergen Reactions   Lexapro [Escitalopram Oxalate] Other (See Comments)    Lack of therapeutic effect    Zoloft [Sertraline Hcl] Other (See Comments)    Lack of therapeutic effect     Patient Measurements:    Vital Signs: Temp: 100.2 F (37.9 C) (06/03 0331) Temp Source: Oral (06/03 0331) BP: 137/78 (06/03 0630) Pulse Rate: 116 (06/03 0639)  Labs: Recent Labs    01/29/24 1042 01/30/24 0921 01/30/24 2344 01/31/24 0416 01/31/24 0432  HGB 11.7* 11.9* 12.7*  --   --   HCT 35.9* 35.2* 37.5*  --   --   PLT 248 255 307  --   --   LABPROT  --   --   --  15.7*  --   INR  --   --   --  1.2  --   CREATININE 0.87 1.02 0.96 1.32*  --   TROPONINIHS  --   --  5  --  8    Estimated Creatinine Clearance: 87.3 mL/min (A) (by C-G formula based on SCr of 1.32 mg/dL (H)).   Medical History: Past Medical History:  Diagnosis Date   GAD (generalized anxiety disorder)    HTN (hypertension)    Insomnia    Migraine without aura    Overweight    Trigeminal neuralgia     Medications: No anticoagulants PTA  Assessment: Pt is a 69 yoM with PMH significant for EtOH abuse and recent prolonged hospitalization (5/20 > 6/2) with DTs. Pt received subcutaneous heparin  for DVT prophylaxis throughout admission (last dose 6/2 @ 14:24)  Pt presented to ED the same day of discharge with complaint of chest pain. CTA + acute PE with mild right heart strain.   Today, 01/31/24 CBC from 6/2: Hgb slightly low (12.7), Plt WNL SCr 1.32 elevated from yesterday  Goal of Therapy:  Heparin  level 0.3-0.7 units/ml Monitor platelets by anticoagulation protocol: Yes   Plan:  Heparin  bolus of 2700 units IV once Initiate heparin  infusion at 1650 units/hr Check 6 hour heparin  level CBC, heparin  level daily Monitor for signs of bleeding  Shireen Dory, PharmD 01/31/2024,7:15 AM

## 2024-01-31 NOTE — ED Notes (Signed)
Updated vitals

## 2024-01-31 NOTE — Progress Notes (Signed)
 Bilateral lower extremity venous duplex has been completed. Preliminary results can be found in CV Proc through chart review.  Results were given to Dr. Bonita Bussing.  01/31/24 1:20 PM Birda Buffy RVT

## 2024-01-31 NOTE — ED Notes (Addendum)
 [employer] pushed this outside and got this patient into his called , stated the mom will wait inside. This writer went outside to the patients car and told him due to his health concerns we will need him to stay inside to keep a closer eye on him. Patient states he understands, I help him back inside to the waiting area

## 2024-01-31 NOTE — ED Notes (Signed)
 Patients mother states patient chest pain is getting worst triage nurse notified

## 2024-01-31 NOTE — Progress Notes (Signed)
 PHARMACY - ANTICOAGULATION CONSULT NOTE  Pharmacy Consult for heparin  Indication: pulmonary embolus  Allergies  Allergen Reactions   Lexapro [Escitalopram Oxalate] Other (See Comments)    Lack of therapeutic effect    Zoloft [Sertraline Hcl] Other (See Comments)    Lack of therapeutic effect     Patient Measurements:    Vital Signs: Temp: 101.4 F (38.6 C) (06/03 1500) Temp Source: Oral (06/03 1500) BP: 166/99 (06/03 1500) Pulse Rate: 100 (06/03 1537)  Labs: Recent Labs    01/29/24 1042 01/30/24 0921 01/30/24 2344 01/31/24 0416 01/31/24 0432 01/31/24 1610  HGB 11.7* 11.9* 12.7*  --   --   --   HCT 35.9* 35.2* 37.5*  --   --   --   PLT 248 255 307  --   --   --   LABPROT  --   --   --  15.7*  --   --   INR  --   --   --  1.2  --   --   HEPARINUNFRC  --   --   --   --   --  <0.10*  CREATININE 0.87 1.02 0.96 1.32*  --   --   CKTOTAL  --   --   --   --   --  112  TROPONINIHS  --   --  5  --  8  --     Estimated Creatinine Clearance: 87.3 mL/min (A) (by C-G formula based on SCr of 1.32 mg/dL (H)).  Medications: No anticoagulants PTA  Assessment: Pt is a 67 yoM with PMH significant for EtOH abuse and recent prolonged hospitalization (5/20 > 6/2) with DTs. Pt received subcutaneous heparin  for DVT prophylaxis throughout admission (last dose 6/2 @ 14:24)  Pt presented to ED the same day of discharge with complaint of chest pain. CTA + acute PE with mild right heart strain.   Today, 01/31/24 CBC from 6/2: Hgb slightly low (12.7), Plt WNL SCr 1.32 elevated from yesterday First heparin  level undetectable on 1650 units/hr No infusion or bleeding issues per RN  Goal of Therapy:  Heparin  level 0.3-0.7 units/ml Monitor platelets by anticoagulation protocol: Yes   Plan:  Heparin  IV bolus 4000 units x 1 Increase heparin  to 2100 units/hr Recheck heparin  level 6 hr after rate change CBC, heparin  level daily Monitor for signs of bleeding  Javarie Crisp A,  PharmD 01/31/2024,5:26 PM

## 2024-01-31 NOTE — ED Notes (Signed)
 Pts vitals updated, triage nurse made aware of pts temp and conditions.

## 2024-01-31 NOTE — ED Notes (Signed)
 Unable to obtain second IV access at this time.

## 2024-01-31 NOTE — Plan of Care (Signed)
  Problem: Clinical Measurements: Goal: Diagnostic test results will improve Outcome: Not Progressing Goal: Respiratory complications will improve Outcome: Not Progressing Goal: Cardiovascular complication will be avoided Outcome: Not Progressing   Problem: Coping: Goal: Level of anxiety will decrease Outcome: Not Progressing   Problem: Elimination: Goal: Will not experience complications related to urinary retention Outcome: Progressing

## 2024-01-31 NOTE — Sepsis Progress Note (Signed)
 Elink monitoring for the code sepsis protocol.

## 2024-01-31 NOTE — H&P (Signed)
 History and Physical    Patient: Juan Clarke:[id number] DOB: 27-Jul-1981 DOA: 01/30/2024 DOS: the patient was seen and examined on 01/31/2024 PCP: Perley Bradley, MD  Patient coming from: Home  Chief Complaint:  Chief Complaint  Patient presents with   Chest Pain   Shortness of Breath   HPI: Juan Clarke is a 43 y.o. male with medical history significant of GAD, class I obesity, alcohol abuse, who was discharged yesterday after 13-day admission due to acute metabolic encephalopathy in the setting of EtOH withdrawal and serotonin syndrome who returned to the emergency department due to pleuritic chest pain associated with dyspnea. No palpitations, diaphoresis, PND, orthopnea or pitting edema of the lower extremities. He denied fever, chills, rhinorrhea, sore throat, wheezing or hemoptysis.  No abdominal pain, nausea, emesis, diarrhea, constipation, melena or hematochezia.  No flank pain, dysuria, frequency or hematuria.  No polyuria, polydipsia, polyphagia or blurred vision.   Lab work: Urinalysis was Hospital doctor with a specific gravity 1.035 CBC showed white count 13.3, hemoglobin 12.7 g/dL and platelets 811.  PT was 15.7 and INR 1.2.  Lactic acid and troponin x 2 normal.  CMP showed sodium 128, potassium 3.7, chloride 94 and CO2 21 mmol/L with a normal anion gap.  Glucose 111, BUN 22 and creatinine 1.32 mg/dL.  AST 826, ALT 239 alkaline phosphatase 284 units/L.  Calcium  and the rest of the LFTs were normal.  Negative coronavirus, influenza and RSV PCR.  Imaging: CTA chest positive for acute pulmonary embolism with thrombus identified at the bifurcation of the distal right main pulmonary artery.  Thrombus extending into the right lower lobar segmental and subsegmental pulmonary arteries.  Small filling defect is also identified with the segmental branch of the left upper lobe pulmonary artery.  RV to LV ratio is 1.04.  There is small bilateral pleural effusions.  Peripheral groundglass attenuation  and mild consolidative change within the right lower lobe compatible with infarct.  Distal esophagus possible esophagitis.  CAD.  Aortic atherosclerosis.   ED course: Initial vital signs were temperature 99.5 F, pulse unremittingly aspiration 18, BP 128/87 mmHg O2 sat 95% on room air.  The patient received cefepime , vancomycin , LR 2000 mL liter bolus followed by LR 150 mL/h, morphine 4 mg IVP x 2, fentanyl  50 mcg IVP x 1 and was started on heparin .  Review of Systems: As mentioned in the history of present illness. All other systems reviewed and are negative. Past Medical History:  Diagnosis Date   GAD (generalized anxiety disorder)    HTN (hypertension)    Insomnia    Migraine without aura    Overweight    Trigeminal neuralgia    Past Surgical History:  Procedure Laterality Date   Face surgery  11/14/14   TONSILLECTOMY     43 yr old   Social History:  reports that he has never smoked. He has never used smokeless tobacco. He reports current alcohol use of about 42.0 standard drinks of alcohol per week. He reports current drug use. Frequency: 7.00 times per week. Drug: Marijuana.  Allergies  Allergen Reactions   Lexapro [Escitalopram Oxalate] Other (See Comments)    Lack of therapeutic effect    Zoloft [Sertraline Hcl] Other (See Comments)    Lack of therapeutic effect     Family History  Problem Relation Age of Onset   Migraines Mother    Hypertension Father    Mental illness Sister    Drug abuse Sister    Brain cancer Maternal Grandmother  Heart disease Paternal Grandfather     Prior to Admission medications   Medication Sig Start Date End Date Taking? Authorizing Provider  acetaminophen  (TYLENOL ) 325 MG tablet Take 325-650 mg by mouth every 6 (six) hours as needed for mild pain (pain score 1-3) or headache.    [provider]  allopurinol  (ZYLOPRIM ) 300 MG tablet Take 300 mg by mouth daily.    [provider]  amoxicillin -clavulanate (AUGMENTIN)  875-125 MG tablet Take 1 tablet by mouth every 12 (twelve) hours. 01/30/24   Vann, Jessica U, DO  atorvastatin  (LIPITOR) 20 MG tablet Take 20 mg by mouth daily.    [provider]  carbamazepine  (TEGRETOL ) 200 MG tablet Take 2 tablets (400 mg total) by mouth at bedtime. 01/30/24   Vann, Jessica U, DO  Dextromethorphan HBr 15 MG CAPS Take 15 mg by mouth every 8 (eight) hours as needed (for coughing).    [provider]  diphenhydrAMINE  (BENADRYL ) 25 mg capsule Take 25 mg by mouth every 6 (six) hours as needed for itching or allergies.    [provider]  DULoxetine  (CYMBALTA ) 20 MG capsule Take 2 capsules (40 mg total) by mouth daily. 01/31/24   Vann, Jessica U, DO  ketotifen (ZADITOR) 0.035 % ophthalmic solution Place 1 drop into both eyes 2 (two) times daily as needed (for itching).    [provider]  lactulose  (CHRONULAC ) 10 GM/15ML solution Take 30 mLs (20 g total) by mouth daily. 01/31/24   Vann, Jessica U, DO  levothyroxine  (SYNTHROID ) 75 MCG tablet Take 75 mcg by mouth daily before breakfast. 12/21/23   [provider]  lip balm (BLISTEX) OINT Apply 1 Application topically as needed for lip care.    [provider]  lisinopril -hydrochlorothiazide (ZESTORETIC) 20-25 MG tablet Take 1 tablet by mouth daily. 12/15/18   [provider]  LORazepam  (ATIVAN ) 1 MG tablet Take 1 tablet (1 mg total) by mouth every 8 (eight) hours as needed for anxiety. 01/30/24   Vann, Jessica U, DO  methocarbamol (ROBAXIN) 500 MG tablet Take 500 mg by mouth every 8 (eight) hours as needed for muscle spasms.    [provider]  PEPCID  COMPLETE [phone] MG chewable tablet Chew 1 tablet by mouth daily as needed (for heartburn or reflux).    [provider]  propranolol (INDERAL) 20 MG tablet Take 20 mg by mouth 3 (three) times daily.    [provider]  thiamine  (VITAMIN B-1) 100 MG tablet Take 1 tablet (100 mg total) by mouth daily. 01/31/24    Vann, Jessica U, DO  traZODone (DESYREL) 50 MG tablet Take 50 mg by mouth at bedtime as needed for sleep.    [provider]    Physical Exam: Vitals:   01/31/24 0406 01/31/24 0530 01/31/24 0630 01/31/24 0639  BP: 121/75 129/83 137/78   Pulse: (!) 125 (!) 124 (!) 124 (!) 116  Resp: (!) 22 (!) 24 (!) 24 (!) 23  Temp:      TempSrc:      SpO2: 96% 96% 96% 95%   Physical Exam Vitals reviewed.  Constitutional:      General: He is awake. He is not in acute distress.    Appearance: He is well-developed. He is ill-appearing.  HENT:     Head: Normocephalic.     Nose: No rhinorrhea.  Eyes:     General: No scleral icterus.    Pupils: Pupils are equal, round, and reactive to light.  Neck:  Vascular: No JVD.  Cardiovascular:     Rate and Rhythm: Regular rhythm. Tachycardia present.     Heart sounds: S1 normal and S2 normal.  Pulmonary:     Breath sounds: No wheezing, rhonchi or rales.  Abdominal:     General: Bowel sounds are normal.     Palpations: Abdomen is soft.     Tenderness: There is no abdominal tenderness. There is no right CVA tenderness, left CVA tenderness or guarding.  Musculoskeletal:     Cervical back: Neck supple.     Right lower leg: No edema.     Left lower leg: No edema.  Skin:    General: Skin is warm and dry.  Neurological:     General: No focal deficit present.     Mental Status: He is alert and oriented to person, place, and time.  Psychiatric:        Mood and Affect: Mood normal.        Behavior: Behavior normal. Behavior is cooperative.     Data Reviewed:  Results are pending, will review when available.  EKG: Vent. rate 123 BPM PR interval 122 ms QRS duration 90 ms QT/QTcB 357/511 ms P-R-T axes 66 104 -9 Sinus tachycardia Probable left atrial enlargement Right axis deviation Borderline T wave abnormalities Prolonged QT interval  Assessment and Plan: Principal Problem:   Pulmonary embolism (HCC) With right heart strain and  associated with:   Acute bilateral deep vein thrombosis (DVT) of popliteal veins (HCC) Observation/PCU. Supplemental oxygen as needed. Continue heparin  infusion. Explained that he will be switched to DOAC Hydromorphone  as needed for pain. Obtain echocardiogram.  Active Problems:   Generalized anxiety disorder Associated with history of depression. Hold duloxetine  due to transaminitis.    ETOH abuse Currently in remission for the past 2 weeks.    Prolonged QT interval Avoid QT prolonging meds as possible. KCl supplementation. Keep electrolytes optimized. Check EKG in the morning.    Acquired hypothyroidism Continue levothyroxine  75 mcg p.o. daily.    Essential hypertension Begin carvedilol 6.25 mg p.o. twice daily.    Mixed hyperlipidemia Hold statin due to abnormal LFTs.    Class 2 obesity Current BMI 36.15 kg/m. Would benefit from lifestyle modifications. Follow-up closely with PCP and/or bariatric clinic.      Advance Care Planning:   Code Status: Full Code   Consults:   Family Communication:   Severity of Illness: The appropriate patient status for this patient is INPATIENT. Inpatient status is judged to be reasonable and necessary in order to provide the required intensity of service to ensure the patient's safety. The patient's presenting symptoms, physical exam findings, and initial radiographic and laboratory data in the context of their chronic comorbidities is felt to place them at high risk for further clinical deterioration. Furthermore, it is not anticipated that the patient will be medically stable for discharge from the hospital within 2 midnights of admission.   * I certify that at the point of admission it is my clinical judgment that the patient will require inpatient hospital care spanning beyond 2 midnights from the point of admission due to high intensity of service, high risk for further deterioration and high frequency of surveillance  required.*  Author: Danice Dural, MD 01/31/2024 7:49 AM  For on call review [email or web address].   This document was prepared using Dragon voice recognition software and may contain some unintended transcription errors.

## 2024-02-01 ENCOUNTER — Telehealth (HOSPITAL_COMMUNITY): Payer: Self-pay | Admitting: Pharmacy Technician

## 2024-02-01 ENCOUNTER — Inpatient Hospital Stay (HOSPITAL_COMMUNITY)

## 2024-02-01 ENCOUNTER — Other Ambulatory Visit (HOSPITAL_COMMUNITY): Payer: Self-pay

## 2024-02-01 DIAGNOSIS — I82433 Acute embolism and thrombosis of popliteal vein, bilateral: Secondary | ICD-10-CM | POA: Diagnosis not present

## 2024-02-01 DIAGNOSIS — I2602 Saddle embolus of pulmonary artery with acute cor pulmonale: Secondary | ICD-10-CM | POA: Diagnosis not present

## 2024-02-01 DIAGNOSIS — I2609 Other pulmonary embolism with acute cor pulmonale: Secondary | ICD-10-CM | POA: Diagnosis not present

## 2024-02-01 LAB — COMPREHENSIVE METABOLIC PANEL WITH GFR
ALT: 172 U/L — ABNORMAL HIGH (ref 0–44)
AST: 75 U/L — ABNORMAL HIGH (ref 15–41)
Albumin: 3.3 g/dL — ABNORMAL LOW (ref 3.5–5.0)
Alkaline Phosphatase: 247 U/L — ABNORMAL HIGH (ref 38–126)
Anion gap: 12 (ref 5–15)
BUN: 14 mg/dL (ref 6–20)
CO2: 22 mmol/L (ref 22–32)
Calcium: 8.6 mg/dL — ABNORMAL LOW (ref 8.9–10.3)
Chloride: 97 mmol/L — ABNORMAL LOW (ref 98–111)
Creatinine, Ser: 0.89 mg/dL (ref 0.61–1.24)
GFR, Estimated: >60 mL/min (ref >60)
Glucose, Bld: 94 mg/dL (ref 70–99)
Potassium: 4.4 mmol/L (ref 3.5–5.1)
Sodium: 131 mmol/L — ABNORMAL LOW (ref 135–145)
Total Bilirubin: 1.2 mg/dL (ref 0.0–1.2)
Total Protein: 7.1 g/dL (ref 6.5–8.1)

## 2024-02-01 LAB — ECHOCARDIOGRAM COMPLETE
AR max vel: 2.5 cm2
AV Area VTI: 2.84 cm2
AV Area mean vel: 2.38 cm2
AV Mean grad: 5 mmHg
AV Peak grad: 9.2 mmHg
Ao pk vel: 1.52 m/s
Area-P 1/2: 4.71 cm2
Calc EF: 70.3 %
Height: 72 in
S' Lateral: 2.3 cm
Single Plane A2C EF: 72 %
Single Plane A4C EF: 67.8 %
Weight: 4264.58 [oz_av]

## 2024-02-01 LAB — CBC
HCT: 35.1 % — ABNORMAL LOW (ref 39.0–52.0)
Hemoglobin: 11.2 g/dL — ABNORMAL LOW (ref 13.0–17.0)
MCH: 29.9 pg (ref 26.0–34.0)
MCHC: 31.9 g/dL (ref 30.0–36.0)
MCV: 93.9 fL (ref 80.0–100.0)
Platelets: 279 10*3/uL (ref 150–400)
RBC: 3.74 MIL/uL — ABNORMAL LOW (ref 4.22–5.81)
RDW: 13.3 % (ref 11.5–15.5)
WBC: 12.7 10*3/uL — ABNORMAL HIGH (ref 4.0–10.5)
nRBC: 0 % (ref 0.0–0.2)

## 2024-02-01 LAB — HEPARIN LEVEL (UNFRACTIONATED)
Heparin Unfractionated: 0.3 [IU]/mL (ref 0.30–0.70)
Heparin Unfractionated: <0.1 [IU]/mL — ABNORMAL LOW (ref 0.30–0.70)
Heparin Unfractionated: <0.1 [IU]/mL — ABNORMAL LOW (ref 0.30–0.70)

## 2024-02-01 MED ORDER — ACETAMINOPHEN 325 MG PO TABS
650.0000 mg | ORAL_TABLET | Freq: Four times a day (QID) | ORAL | Status: DC | PRN
  Administered 2024-02-01 – 2024-02-02 (×3): 650 mg via ORAL
  Filled 2024-02-01 (×3): qty 2

## 2024-02-01 MED ORDER — LORAZEPAM 1 MG PO TABS: 1.0000 mg | ORAL_TABLET | Freq: Four times a day (QID) | ORAL | Status: DC | PRN

## 2024-02-01 MED ORDER — HEPARIN BOLUS VIA INFUSION
3000.0000 [IU] | Freq: Once | INTRAVENOUS | Status: AC
  Administered 2024-02-01: 3000 [IU] via INTRAVENOUS
  Filled 2024-02-01: qty 3000

## 2024-02-01 MED ORDER — ENOXAPARIN SODIUM 120 MG/0.8ML IJ SOSY
1.0000 mg/kg | PREFILLED_SYRINGE | Freq: Two times a day (BID) | INTRAMUSCULAR | Status: DC
  Administered 2024-02-02 (×2): 120 mg via SUBCUTANEOUS
  Filled 2024-02-01 (×4): qty 0.8

## 2024-02-01 MED ORDER — ENOXAPARIN SODIUM 120 MG/0.8ML IJ SOSY
1.0000 mg/kg | PREFILLED_SYRINGE | INTRAMUSCULAR | Status: AC
  Administered 2024-02-01: 120 mg via SUBCUTANEOUS
  Filled 2024-02-01: qty 0.8

## 2024-02-01 MED ORDER — HYDROMORPHONE HCL 1 MG/ML IJ SOLN
0.5000 mg | INTRAMUSCULAR | Status: AC | PRN
  Administered 2024-02-01 – 2024-02-02 (×5): 1 mg via INTRAVENOUS
  Filled 2024-02-01 (×6): qty 1

## 2024-02-01 MED ORDER — PERFLUTREN LIPID MICROSPHERE
1.0000 mL | INTRAVENOUS | Status: AC | PRN
  Administered 2024-02-01: 2 mL via INTRAVENOUS

## 2024-02-01 MED ORDER — OXYCODONE HCL 5 MG PO TABS
10.0000 mg | ORAL_TABLET | ORAL | Status: DC | PRN
  Administered 2024-02-01 (×2): 10 mg via ORAL
  Filled 2024-02-01 (×2): qty 2

## 2024-02-01 MED ORDER — ACETAMINOPHEN 650 MG RE SUPP: 325.0000 mg | Freq: Once | RECTAL | Status: AC | PRN

## 2024-02-01 MED ORDER — ACETAMINOPHEN 325 MG PO TABS
325.0000 mg | ORAL_TABLET | Freq: Once | ORAL | Status: AC | PRN
  Administered 2024-02-01: 325 mg via ORAL
  Filled 2024-02-01: qty 1

## 2024-02-01 MED ORDER — HEPARIN BOLUS VIA INFUSION
3000.0000 [IU] | Freq: Once | INTRAVENOUS | Status: DC
  Filled 2024-02-01: qty 3000

## 2024-02-01 NOTE — Progress Notes (Addendum)
 PHARMACY - ANTICOAGULATION CONSULT NOTE  Pharmacy Consult for heparin  Indication: pulmonary embolus  Allergies  Allergen Reactions   Lexapro [Escitalopram Oxalate] Other (See Comments)    Lack of therapeutic effect    Zoloft [Sertraline Hcl] Other (See Comments)    Lack of therapeutic effect     Patient Measurements: Height: 6' (182.9 cm) Weight: 120.9 kg (266 lb 8.6 oz) IBW/kg (Calculated) : 77.6 HEPARIN  DW (KG): 104.2  Vital Signs: Temp: 100.8 F (38.2 C) (06/04 1200) Temp Source: Oral (06/04 1200) BP: 162/90 (06/04 1637) Pulse Rate: 108 (06/04 1637)  Labs: Recent Labs    01/30/24 0921 01/30/24 2344 01/31/24 0416 01/31/24 0432 01/31/24 1610 01/31/24 1610 02/01/24 0105 02/01/24 0829 02/01/24 1550  HGB 11.9* 12.7*  --   --   --   --  11.2*  --   --   HCT 35.2* 37.5*  --   --   --   --  35.1*  --   --   PLT 255 307  --   --   --   --  279  --   --   LABPROT  --   --  15.7*  --   --   --   --   --   --   INR  --   --  1.2  --   --   --   --   --   --   HEPARINUNFRC  --   --   --   --  <0.10*   < > 0.30 <0.10* <0.10*  CREATININE 1.02 0.96 1.32*  --   --   --  0.89  --   --   CKTOTAL  --   --   --   --  112  --   --   --   --   TROPONINIHS  --  5  --  8  --   --   --   --   --    < > = values in this interval not displayed.    Estimated Creatinine Clearance: 146.6 mL/min (by C-G formula based on SCr of 0.89 mg/dL).  Medications: No anticoagulants PTA  Assessment: Pt is a 30 yoM with PMH significant for EtOH abuse and recent prolonged hospitalization (5/20 > 6/2) with DTs. Pt received subcutaneous heparin  for DVT prophylaxis throughout admission (last dose 6/2 @ 14:24)  Pt presented to ED the same day of discharge with complaint of chest pain. CTA + acute PE with mild right heart strain. Ultrasound lower extremities revealed bilateral DVT.  Today, 02/01/24 Heparin  level again < 0.10 - remains undetectable after 3000 unit heparin  bolus and heparin  rate increase  to 2500 units/hr. RN reports "IV looks red and is tender to touch. patient is requesting for me to start a new IV. looks as if heparin  is not getting into his veins".  No bleeding noted  CBC stable  Goal of Therapy:  Heparin  level 0.3-0.7 units/ml Monitor platelets by anticoagulation protocol: Yes   Plan:  RN to establish new IV site for heparin  infusion Re-bolus 3000 units heparin  Continue heparin  infusion @ slightly lower rate of 2300 units/hr check heparin  level 6 hr after bolus and rate change CBC, heparin  level daily Monitor for signs of bleeding  Rubie Corona, Pharm.D Use secure chat for questions 02/01/2024 5:06 PM  Addendum: Unable to get new IV access  Plan: per d/w Dr Tharon Finder:  start enoxaparin  1 mg/kg sq q12h = 120 mg  sq q12hrs  Rubie Corona, Pharm.D Use secure chat for questions 02/01/2024 6:10 PM

## 2024-02-01 NOTE — Progress Notes (Signed)
 While rounding on unit, I sought to check in with Mr. Mcmillen who I recognized and seemed awake and alert.  Mr. Lesch received my visit but expressed reservation in engaging partly as he identifies as atheist. He shared that this was actually a readmission to ICU.  I assured Mr. Kerstein that I am available to support him regardless of belief. I let him know that [facility] chaplains are always available simply to listen or support him in whatever way he deems meaningful. I offered normalization of emotions, as his health status may have caused fear or other emotion.  I asked about his comfort level and whether he had needs. I expressed my hope for his continued improvement and let him know how to request a visit through the care team if desired.  Tyliah Schlereth L. Minetta Aly, M.Div [phone]

## 2024-02-01 NOTE — Progress Notes (Signed)
*  PRELIMINARY RESULTS* Echocardiogram 2D Echocardiogram has been performed.  Juan Clarke 02/01/2024, 8:58 AM

## 2024-02-01 NOTE — Progress Notes (Signed)
 PHARMACY - ANTICOAGULATION CONSULT NOTE  Pharmacy Consult for heparin  Indication: pulmonary embolus  Allergies  Allergen Reactions   Lexapro [Escitalopram Oxalate] Other (See Comments)    Lack of therapeutic effect    Zoloft [Sertraline Hcl] Other (See Comments)    Lack of therapeutic effect     Patient Measurements: Height: 6' (182.9 cm) Weight: 120.9 kg (266 lb 8.6 oz) IBW/kg (Calculated) : 77.6 HEPARIN  DW (KG): 104.2  Vital Signs: Temp: 99.5 F (37.5 C) (06/04 0500) Temp Source: Oral (06/04 0327) BP: 154/52 (06/04 0600) Pulse Rate: 118 (06/04 0600)  Labs: Recent Labs    01/30/24 0921 01/30/24 2344 01/31/24 0416 01/31/24 0432 01/31/24 1610 02/01/24 0105 02/01/24 0829  HGB 11.9* 12.7*  --   --   --  11.2*  --   HCT 35.2* 37.5*  --   --   --  35.1*  --   PLT 255 307  --   --   --  279  --   LABPROT  --   --  15.7*  --   --   --   --   INR  --   --  1.2  --   --   --   --   HEPARINUNFRC  --   --   --   --  <0.10* 0.30 <0.10*  CREATININE 1.02 0.96 1.32*  --   --  0.89  --   CKTOTAL  --   --   --   --  112  --   --   TROPONINIHS  --  5  --  8  --   --   --     Estimated Creatinine Clearance: 146.6 mL/min (by C-G formula based on SCr of 0.89 mg/dL).  Medications: No anticoagulants PTA  Assessment: Pt is a 89 yoM with PMH significant for EtOH abuse and recent prolonged hospitalization (5/20 > 6/2) with DTs. Pt received subcutaneous heparin  for DVT prophylaxis throughout admission (last dose 6/2 @ 14:24)  Pt presented to ED the same day of discharge with complaint of chest pain. CTA + acute PE with mild right heart strain. Ultrasound lower extremities revealed bilateral DVT.  Today, 02/01/24 Heparin  level < 0.10 - undetectable with heparin  infusion at 2200 units/hr CBC stable Confirmed with nurse, no infusion issues noted No other complications of therapy noted  Goal of Therapy:  Heparin  level 0.3-0.7 units/ml Monitor platelets by anticoagulation protocol:  Yes   Plan:  Bolus 3000 units heparin  Increase heparin  infusion to 2500 units/hr Recheck heparin  level 6 hr after rate change CBC, heparin  level daily Monitor for signs of bleeding  Lolita Rise, PharmD, BCPS Clinical Pharmacist 02/01/2024 9:05 AM

## 2024-02-01 NOTE — Plan of Care (Signed)
 Patient transition off heparin  gtt now receiving Lovenox , still has complaints of pain which is being covered with PRN medications, tolerating his diet, still complains of diarrhea although last BM was June 2nd,plan of care and goals reviewed, patient handbook/guide at bedside.  Problem: Education: Goal: Knowledge of General Education information will improve Description: Including pain rating scale, medication(s)/side effects and non-pharmacologic comfort measures Outcome: Progressing   Problem: Health Behavior/Discharge Planning: Goal: Ability to manage health-related needs will improve Outcome: Progressing   Problem: Clinical Measurements: Goal: Ability to maintain clinical measurements within normal limits will improve Outcome: Progressing Goal: Will remain free from infection Outcome: Progressing Goal: Diagnostic test results will improve Outcome: Progressing Goal: Respiratory complications will improve Outcome: Progressing Goal: Cardiovascular complication will be avoided Outcome: Progressing   Problem: Activity: Goal: Risk for activity intolerance will decrease Outcome: Progressing   Problem: Nutrition: Goal: Adequate nutrition will be maintained Outcome: Progressing   Problem: Coping: Goal: Level of anxiety will decrease Outcome: Progressing   Problem: Elimination: Goal: Will not experience complications related to bowel motility Outcome: Progressing Goal: Will not experience complications related to urinary retention Outcome: Progressing   Problem: Pain Managment: Goal: General experience of comfort will improve and/or be controlled Outcome: Progressing   Problem: Safety: Goal: Ability to remain free from injury will improve Outcome: Progressing   Problem: Skin Integrity: Goal: Risk for impaired skin integrity will decrease Outcome: Progressing

## 2024-02-01 NOTE — Progress Notes (Signed)
 PHARMACY - ANTICOAGULATION CONSULT NOTE  Pharmacy Consult for heparin  Indication: pulmonary embolus  Allergies  Allergen Reactions   Lexapro [Escitalopram Oxalate] Other (See Comments)    Lack of therapeutic effect    Zoloft [Sertraline Hcl] Other (See Comments)    Lack of therapeutic effect     Patient Measurements: Height: 6' (182.9 cm) Weight: 120.9 kg (266 lb 8.6 oz) IBW/kg (Calculated) : 77.6 HEPARIN  DW (KG): 104.2  Vital Signs: Temp: 100.1 F (37.8 C) (06/04 0145) Temp Source: Oral (06/04 0000) BP: 154/80 (06/03 2300) Pulse Rate: 105 (06/03 2300)  Labs: Recent Labs    01/30/24 0921 01/30/24 2344 01/31/24 0416 01/31/24 0432 01/31/24 1610 02/01/24 0105  HGB 11.9* 12.7*  --   --   --  11.2*  HCT 35.2* 37.5*  --   --   --  35.1*  PLT 255 307  --   --   --  279  LABPROT  --   --  15.7*  --   --   --   INR  --   --  1.2  --   --   --   HEPARINUNFRC  --   --   --   --  <0.10* 0.30  CREATININE 1.02 0.96 1.32*  --   --  0.89  CKTOTAL  --   --   --   --  112  --   TROPONINIHS  --  5  --  8  --   --     Estimated Creatinine Clearance: 146.6 mL/min (by C-G formula based on SCr of 0.89 mg/dL).  Medications: No anticoagulants PTA  Assessment: Pt is a 57 yoM with PMH significant for EtOH abuse and recent prolonged hospitalization (5/20 > 6/2) with DTs. Pt received subcutaneous heparin  for DVT prophylaxis throughout admission (last dose 6/2 @ 14:24)  Pt presented to ED the same day of discharge with complaint of chest pain. CTA + acute PE with mild right heart strain.   Today, 02/01/24 Heparin  level = 0.3 (therapeutic but at very low end of goal range) with heparin  @ 2100 untis/hr Hgb 11.2, Plt 279k SCr 0.89 No infusion or bleeding issues reported  Goal of Therapy:  Heparin  level 0.3-0.7 units/ml Monitor platelets by anticoagulation protocol: Yes   Plan:  Increase heparin  to 2200 units/hr to ensure therapeutic dose Recheck heparin  level 6 hr after rate  change CBC, heparin  level daily Monitor for signs of bleeding  Latoiya Maradiaga, Gabriel John, PharmD 02/01/2024,2:39 AM

## 2024-02-01 NOTE — Progress Notes (Signed)
 PROGRESS NOTE    Juan Clarke  ZOX:[id number] DOB: [birth year] DOA: 01/30/2024 PCP: Perley Bradley, MD    Brief Narrative:  43 year old male with history of generalized anxiety disorder, alcohol abuse who was discharged 1 day ago after 13 days of hospitalization for alcohol withdrawal syndrome, metabolic encephalopathy, mechanical ventilation and extubation.  He had some chest discomfort on discharge however he was stable.  He came back to the hospital complaining of worsening chest pain with shortness of breath.  In the emergency room hemodynamically stable.  Patient was found to have acute PE right main pulmonary artery with right ventricular strain, infarction of the lung.  Also found to have lower extremity DVT.  Admitted on IV heparin .  Subjective: Patient seen and examined.  Complains of mild to moderate chest pain worse on deep breathing.  He has temperature 101.  No cough.  Remains on minimal oxygen.  Getting echocardiogram. Assessment & Plan:    Acute PE with cor pulmonale Acute right lower extremity DVT Patient currently hemodynamically stable.  On minimal room air secondary to pleural infarction.  Remains on heparin .  Echocardiogram pending to look for any right ventricular strain however anticipating conservative management with anticoagulation. Continue heparin  today, patient is not ready to transition to DOAC. Incentive spirometry, breathing exercises, mobility and adequate pain control.  Anticipate discharge on Eliquis when medically stable.  Fever: Suspect metabolic fever due to pulmonary infarction.  Chest.  Therapy as above.  Patient had received IV fluid boluses and first dose of antibiotics.  Holding further antibiotics yet.  Cultures are negative so far.  Hypothyroidism: On Synthroid .  Hypertension, on Coreg.  Alcohol abuse, anxiety disorder: Currently needing as needed medications.  He is total abstinence for last 2 weeks.  Looking forward to continue outpatient  rehab.     DVT prophylaxis: Heparin  infusion   Code Status: Full code Family Communication: None at the bedside Disposition Plan: Status is: Inpatient Remains inpatient appropriate because: Severe significant symptoms, on a heparin  infusion     Consultants:  None  Procedures:  None  Antimicrobials:  None     Objective: Vitals:   02/01/24 0400 02/01/24 0500 02/01/24 0600 02/01/24 0900  BP: (!) 153/63 (!) 168/83 (!) 154/52 (!) 146/83  Pulse: (!) 104 (!) 111 (!) 118   Resp: (!) 21 (!) 26 (!) 25   Temp:  99.5 F (37.5 C)  (!) 101.8 F (38.8 C)  TempSrc:    Oral  SpO2: 95% 96% 97%   Weight:      Height:        Intake/Output Summary (Last 24 hours) at 02/01/2024 1112 Last data filed at 02/01/2024 0900 Gross per 24 hour  Intake 552.55 ml  Output 1875 ml  Net -1322.45 ml   Filed Weights   01/31/24 1500  Weight: 120.9 kg    Examination:  General exam: Appears calm and comfortable.  Slightly tremulous and febrile. Respiratory system: Clear to auscultation. Respiratory effort normal.  No added sounds. Cardiovascular system: S1 & S2 heard, RRR.  Tachycardic.  No edema.   Gastrointestinal system: Abdomen is nondistended, soft and nontender. No organomegaly or masses felt. Normal bowel sounds heard. Central nervous system: Alert and oriented. No focal neurological deficits. Extremities: Symmetric 5 x 5 power.  No edema or swelling.     Data Reviewed: I have personally reviewed following labs and imaging studies  CBC: Recent Labs  Lab 01/28/24 0724 01/29/24 1042 01/30/24 0921 01/30/24 2344 02/01/24 0105  WBC 9.1 11.3* 13.2*  13.3* 12.7*  NEUTROABS  --   --   --  9.4*  --   HGB 10.3* 11.7* 11.9* 12.7* 11.2*  HCT 32.0* 35.9* 35.2* 37.5* 35.1*  MCV 94.1 91.8 89.3 88.2 93.9  PLT 268 248 255 307 279   Basic Metabolic Panel: Recent Labs  Lab 01/26/24 0552 01/26/24 1604 01/27/24 0357 01/28/24 0724 01/29/24 1042 01/30/24 0921 01/30/24 2344  01/31/24 0416 01/31/24 1610 02/01/24 0105  NA 136  --  137 134* 133* 131* 129* 128*  --  131*  K 3.3*  --  3.6 4.0 3.5 3.4* 3.8 3.7  --  4.4  CL 100  --  106 103 98 97* 95* 94*  --  97*  CO2 28  --  25 23 24 22  20* 21*  --  22  GLUCOSE 151*  --  122* 114* 108* 105* 112* 111*  --  94  BUN 23*  --  16 16 14 18  22* 22*  --  14  CREATININE 1.24  --  0.88 0.97 0.87 1.02 0.96 1.32*  --  0.89  CALCIUM  8.3*  --  8.3* 8.3* 8.8* 9.0 9.2 9.2  --  8.6*  MG 2.5* 2.4 2.4 2.5*  --   --   --   --  2.3  --   PHOS 5.7* 3.7 3.4 4.5  --   --   --   --  4.4  --    GFR: Estimated Creatinine Clearance: 146.6 mL/min (by C-G formula based on SCr of 0.89 mg/dL). Liver Function Tests: Recent Labs  Lab 01/27/24 0357 01/31/24 0416 02/01/24 0105  AST 85* 126* 75*  ALT 122* 239* 172*  ALKPHOS 204* 284* 247*  BILITOT 0.6 0.9 1.2  PROT 6.6 8.1 7.1  ALBUMIN 2.7* 4.0 3.3*   No results for input(s): "LIPASE", "AMYLASE" in the last 168 hours. Recent Labs  Lab 01/29/24 1042  AMMONIA 38*   Coagulation Profile: Recent Labs  Lab 01/31/24 0416  INR 1.2   Cardiac Enzymes: Recent Labs  Lab 01/26/24 0346 01/27/24 0357 01/31/24 1610  CKTOTAL 4,538* 419* 112   BNP (last 3 results) No results for input(s): "PROBNP" in the last 8760 hours. HbA1C: No results for input(s): "HGBA1C" in the last 72 hours. CBG: Recent Labs  Lab 01/29/24 2001 01/30/24 0029 01/30/24 0412 01/30/24 0816 01/30/24 1150  GLUCAP 98 101* 101* 88 94   Lipid Profile: No results for input(s): "CHOL", "HDL", "LDLCALC", "TRIG", "CHOLHDL", "LDLDIRECT" in the last 72 hours. Thyroid Function Tests: No results for input(s): "TSH", "T4TOTAL", "FREET4", "T3FREE", "THYROIDAB" in the last 72 hours. Anemia Panel: No results for input(s): "VITAMINB12", "FOLATE", "FERRITIN", "TIBC", "IRON", "RETICCTPCT" in the last 72 hours. Sepsis Labs: Recent Labs  Lab 01/31/24 0406  LATICACIDVEN 0.8    Recent Results (from the past 240 hours)   Culture, blood (Routine X 2) w Reflex to ID Panel     Status: None   Collection Time: 01/22/24  2:52 PM   Specimen: BLOOD  Result Value Ref Range Status   Specimen Description   Final    BLOOD LEFT ANTECUBITAL Performed at [facility], [address] [place], [place] [address]    Special Requests   Final    BOTTLES DRAWN AEROBIC AND ANAEROBIC Blood Culture results may not be optimal due to an inadequate volume of blood received in culture bottles Performed at [facility], [address], [place], [place] [address]    Culture   Final  NO GROWTH 5 DAYS Performed at [facility], [address], [place], [place] [address]    Report Status 01/27/2024 FINAL  Final  Culture, blood (Routine X 2) w Reflex to ID Panel     Status: None   Collection Time: 01/22/24  2:52 PM   Specimen: BLOOD  Result Value Ref Range Status   Specimen Description   Final    BLOOD BLOOD LEFT HAND Performed at [facility], [address] [place], [place] [address]    Special Requests   Final    BOTTLES DRAWN AEROBIC ONLY Blood Culture results may not be optimal due to an inadequate volume of blood received in culture bottles Performed at [facility], [address], [place], [place] [address]    Culture   Final    NO GROWTH 5 DAYS Performed at [facility], [address], [place], [place] [address]    Report Status 01/27/2024 FINAL  Final  Culture, Respiratory w Gram Stain     Status: None   Collection Time: 01/23/24  7:50 AM   Specimen: Tracheal Aspirate; Respiratory  Result Value Ref Range Status   Specimen Description   Final    TRACHEAL ASPIRATE Performed at [facility], [address] [place], [place] [address]    Special Requests   Final    NONE Performed at [facility], [address] [place], [place] [address]    Gram Stain   Final    FEW WBC PRESENT,  PREDOMINANTLY PMN NO ORGANISMS SEEN    Culture   Final    NO GROWTH 2 DAYS Performed at [facility], [address] [place], [place] [address]    Report Status 01/25/2024 FINAL  Final  Respiratory (~20 pathogens) panel by PCR     Status: None   Collection Time: 01/24/24 11:48 AM   Specimen: Nasopharyngeal Swab; Respiratory  Result Value Ref Range Status   Adenovirus NOT DETECTED NOT DETECTED Final   Coronavirus 229E NOT DETECTED NOT DETECTED Final    Comment: (NOTE) The Coronavirus on the Respiratory Panel, DOES NOT test for the novel  Coronavirus (2019 nCoV)    Coronavirus HKU1 NOT DETECTED NOT DETECTED Final   Coronavirus NL63 NOT DETECTED NOT DETECTED Final   Coronavirus OC43 NOT DETECTED NOT DETECTED Final   Metapneumovirus NOT DETECTED NOT DETECTED Final   Rhinovirus / Enterovirus NOT DETECTED NOT DETECTED Final   Influenza A NOT DETECTED NOT DETECTED Final   Influenza B NOT DETECTED NOT DETECTED Final   Parainfluenza Virus 1 NOT DETECTED NOT DETECTED Final   Parainfluenza Virus 2 NOT DETECTED NOT DETECTED Final   Parainfluenza Virus 3 NOT DETECTED NOT DETECTED Final   Parainfluenza Virus 4 NOT DETECTED NOT DETECTED Final   Respiratory Syncytial Virus NOT DETECTED NOT DETECTED Final   Bordetella pertussis NOT DETECTED NOT DETECTED Final   Bordetella Parapertussis NOT DETECTED NOT DETECTED Final   Chlamydophila pneumoniae NOT DETECTED NOT DETECTED Final   Mycoplasma pneumoniae NOT DETECTED NOT DETECTED Final    Comment: Performed at [facility], [address], [place], [place] [address]  Resp panel by RT-PCR (RSV, Flu A&B, Covid)     Status: None   Collection Time: 01/31/24  4:15 AM   Specimen: Nasal Swab  Result Value Ref Range Status   SARS Coronavirus 2 by RT PCR NEGATIVE NEGATIVE Final    Comment: (NOTE) SARS-CoV-2 target nucleic acids are  NOT DETECTED.  The SARS-CoV-2 RNA is generally detectable in upper respiratory specimens during the acute phase  of infection. The lowest concentration of SARS-CoV-2 viral copies this assay can detect is 138 copies/mL. A negative result does not preclude SARS-Cov-2 infection and should not be used as the sole basis for treatment or other patient management decisions. A negative result may occur with  improper specimen collection/handling, submission of specimen other than nasopharyngeal swab, presence of viral mutation(s) within the areas targeted by this assay, and inadequate number of viral copies(<138 copies/mL). A negative result must be combined with clinical observations, patient history, and epidemiological information. The expected result is Negative.  Fact Sheet for Patients:  BloggerCourse.com  Fact Sheet for Healthcare Providers:  SeriousBroker.it  This test is no t yet approved or cleared by the [place]  FDA and  has been authorized for detection and/or diagnosis of SARS-CoV-2 by FDA under an Emergency Use Authorization (EUA). This EUA will remain  in effect (meaning this test can be used) for the duration of the COVID-19 declaration under Section 564(b)(1) of the Act, 21 U.S.C.section 360bbb-3(b)(1), unless the authorization is terminated  or revoked sooner.       Influenza A by PCR NEGATIVE NEGATIVE Final   Influenza B by PCR NEGATIVE NEGATIVE Final    Comment: (NOTE) The Xpert Xpress SARS-CoV-2/FLU/RSV plus assay is intended as an aid in the diagnosis of influenza from Nasopharyngeal swab specimens and should not be used as a sole basis for treatment. Nasal washings and aspirates are unacceptable for Xpert Xpress SARS-CoV-2/FLU/RSV testing.  Fact Sheet for Patients: BloggerCourse.com  Fact Sheet for Healthcare Providers: SeriousBroker.it  This test is not yet approved or cleared by the [place]  FDA and has been authorized for detection and/or diagnosis of  SARS-CoV-2 by FDA under an Emergency Use Authorization (EUA). This EUA will remain in effect (meaning this test can be used) for the duration of the COVID-19 declaration under Section 564(b)(1) of the Act, 21 U.S.C. section 360bbb-3(b)(1), unless the authorization is terminated or revoked.     Resp Syncytial Virus by PCR NEGATIVE NEGATIVE Final    Comment: (NOTE) Fact Sheet for Patients: BloggerCourse.com  Fact Sheet for Healthcare Providers: SeriousBroker.it  This test is not yet approved or cleared by the [place]  FDA and has been authorized for detection and/or diagnosis of SARS-CoV-2 by FDA under an Emergency Use Authorization (EUA). This EUA will remain in effect (meaning this test can be used) for the duration of the COVID-19 declaration under Section 564(b)(1) of the Act, 21 U.S.C. section 360bbb-3(b)(1), unless the authorization is terminated or revoked.  Performed at [facility], [address], [place], [place] [address]   Blood Culture (routine x 2)     Status: None (Preliminary result)   Collection Time: 01/31/24  4:10 PM   Specimen: BLOOD RIGHT HAND  Result Value Ref Range Status   Specimen Description   Final    BLOOD RIGHT HAND Performed at [facility], [address] [place], [place] [address]    Special Requests   Final    BOTTLES DRAWN AEROBIC AND ANAEROBIC Blood Culture adequate volume Performed at [facility], [address], [place], [place] [address]    Culture PENDING  Incomplete   Report Status PENDING  Incomplete         Radiology Studies: ECHOCARDIOGRAM COMPLETE Result Date: 02/01/2024    ECHOCARDIOGRAM REPORT   Patient Name:   AYHAM WORD Chastang Date of  Exam: 02/01/2024 Medical Rec #:  [id number]        Height:       72.0 in Accession #:    [id number]       Weight:       266.5 lb Date of Birth:  [birth year]        BSA:          2.407 m Patient Age:     43 years         BP:           154/52 mmHg Patient Gender: M                HR:           109 bpm. Exam Location:  Inpatient Procedure: 2D Echo, Color Doppler and Cardiac Doppler (Both Spectral and Color            Flow Doppler were utilized during procedure). Indications:    I26.02 Pulmonary embolus  History:        Patient has no prior history of Echocardiogram examinations.                 Risk Factors:Hypertension.  Sonographer:    Andrena Bang Referring Phys: [id number] DAVID MANUEL ORTIZ  Sonographer Comments: Pt short of breath during exam. IMPRESSIONS  1. Left ventricular ejection fraction, by estimation, is 70 to 75%. The left ventricle has hyperdynamic function. The left ventricle has no regional wall motion abnormalities. There is moderate concentric left ventricular hypertrophy. Left ventricular diastolic parameters are indeterminate.  2. Right ventricular systolic function is normal. The right ventricular size is normal. Tricuspid regurgitation signal is inadequate for assessing PA pressure.  3. The mitral valve is normal in structure. Trivial mitral valve regurgitation. No evidence of mitral stenosis.  4. The aortic valve is normal in structure. Aortic valve regurgitation is not visualized. No aortic stenosis is present.  5. The inferior vena cava is dilated in size with >50% respiratory variability, suggesting right atrial pressure of 8 mmHg. FINDINGS  Left Ventricle: Left ventricular ejection fraction, by estimation, is 70 to 75%. The left ventricle has hyperdynamic function. The left ventricle has no regional wall motion abnormalities. Definity contrast agent was given IV to delineate the left ventricular endocardial borders. The left ventricular internal cavity size was normal in size. There is moderate concentric left ventricular hypertrophy. Left ventricular diastolic parameters are indeterminate. Right Ventricle: The right ventricular size is normal. No increase in right ventricular wall  thickness. Right ventricular systolic function is normal. Tricuspid regurgitation signal is inadequate for assessing PA pressure. Left Atrium: Left atrial size was normal in size. Right Atrium: Right atrial size was normal in size. Pericardium: There is no evidence of pericardial effusion. Mitral Valve: The mitral valve is normal in structure. Trivial mitral valve regurgitation. No evidence of mitral valve stenosis. Tricuspid Valve: The tricuspid valve is normal in structure. Tricuspid valve regurgitation is trivial. No evidence of tricuspid stenosis. Aortic Valve: The aortic valve is normal in structure. Aortic valve regurgitation is not visualized. No aortic stenosis is present. Aortic valve mean gradient measures 5.0 mmHg. Aortic valve peak gradient measures 9.2 mmHg. Aortic valve area, by VTI measures 2.84 cm. Pulmonic Valve: The pulmonic valve was normal in structure. Pulmonic valve regurgitation is not visualized. No evidence of pulmonic stenosis. Aorta: The aortic root is normal in size and structure. Venous: The inferior vena cava is dilated in size with greater than 50% respiratory variability, suggesting right atrial pressure of  8 mmHg. IAS/Shunts: No atrial level shunt detected by color flow Doppler.  LEFT VENTRICLE PLAX 2D LVIDd:         4.00 cm      Diastology LVIDs:         2.30 cm      LV e' medial:    6.84 cm/s LV PW:         1.70 cm      LV E/e' medial:  11.2 LV IVS:        1.50 cm      LV e' lateral:   15.70 cm/s LVOT diam:     2.00 cm      LV E/e' lateral: 4.9 LV SV:         67 LV SV Index:   28 LVOT Area:     3.14 cm  LV Volumes (MOD) LV vol d, MOD A2C: 119.0 ml LV vol d, MOD A4C: 104.0 ml LV vol s, MOD A2C: 33.3 ml LV vol s, MOD A4C: 33.5 ml LV SV MOD A2C:     85.7 ml LV SV MOD A4C:     104.0 ml LV SV MOD BP:      79.8 ml RIGHT VENTRICLE RV S prime:     20.50 cm/s TAPSE (M-mode): 2.0 cm LEFT ATRIUM             Index LA Vol (A2C):   17.7 ml 7.35 ml/m LA Vol (A4C):   46.9 ml 19.48 ml/m LA  Biplane Vol: 29.4 ml 12.21 ml/m  AORTIC VALVE AV Area (Vmax):    2.50 cm AV Area (Vmean):   2.38 cm AV Area (VTI):     2.84 cm AV Vmax:           152.00 cm/s AV Vmean:          103.000 cm/s AV VTI:            0.237 m AV Peak Grad:      9.2 mmHg AV Mean Grad:      5.0 mmHg LVOT Vmax:         121.00 cm/s LVOT Vmean:        78.000 cm/s LVOT VTI:          0.214 m LVOT/AV VTI ratio: 0.90  AORTA Ao Asc diam: 3.20 cm MITRAL VALVE MV Area (PHT): 4.71 cm    SHUNTS MV Decel Time: 161 msec    Systemic VTI:  0.21 m MV E velocity: 76.30 cm/s  Systemic Diam: 2.00 cm MV A velocity: 67.30 cm/s MV E/A ratio:  1.13 Jules Oar MD Electronically signed by Jules Oar MD Signature Date/Time: 02/01/2024/9:12:39 AM    Final    VAS US  LOWER EXTREMITY VENOUS (DVT) Result Date: 01/31/2024  Lower Venous DVT Study Patient Name:  STAFFORD RIVIERA  Date of Exam:   01/31/2024 Medical Rec #: [id number]         Accession #:    [id number] Date of Birth: 11-01-1980         Patient Gender: M Patient Age:   25 years [facility] Procedure:      VAS US  LOWER EXTREMITY VENOUS (DVT) Referring Phys: DAVID ORTIZ --------------------------------------------------------------------------------  Indications: Pulmonary embolism.  Risk Factors: Confirmed PE. Anticoagulation: Heparin . Comparison Study: No prior studies. Performing Technologist: Lerry Ransom RVT  Examination Guidelines: A complete evaluation includes B-mode imaging, spectral Doppler, color Doppler, and power Doppler as needed of all accessible portions of each vessel. Bilateral  testing is considered an integral part of a complete examination. Limited examinations for reoccurring indications may be performed as noted. The reflux portion of the exam is performed with the patient in reverse Trendelenburg.  +---------+---------------+---------+-----------+----------+--------------+ RIGHT    CompressibilityPhasicitySpontaneityPropertiesThrombus Aging  +---------+---------------+---------+-----------+----------+--------------+ CFV      Full           Yes      Yes                                 +---------+---------------+---------+-----------+----------+--------------+ SFJ      Full                                                        +---------+---------------+---------+-----------+----------+--------------+ FV Prox  Full                                                        +---------+---------------+---------+-----------+----------+--------------+ FV Mid   Full                                                        +---------+---------------+---------+-----------+----------+--------------+ FV DistalFull                                                        +---------+---------------+---------+-----------+----------+--------------+ PFV      Full                                                        +---------+---------------+---------+-----------+----------+--------------+ POP      None           No       No                   Acute          +---------+---------------+---------+-----------+----------+--------------+ PTV      Full                                                        +---------+---------------+---------+-----------+----------+--------------+ PERO     None                                         Acute          +---------+---------------+---------+-----------+----------+--------------+ Gastroc  Full                                                        +---------+---------------+---------+-----------+----------+--------------+   +---------+---------------+---------+-----------+----------+--------------+  LEFT     CompressibilityPhasicitySpontaneityPropertiesThrombus Aging +---------+---------------+---------+-----------+----------+--------------+ CFV      Full           Yes      Yes                                  +---------+---------------+---------+-----------+----------+--------------+ SFJ      Full                                                        +---------+---------------+---------+-----------+----------+--------------+ FV Prox  Full                                                        +---------+---------------+---------+-----------+----------+--------------+ FV Mid   Full                                                        +---------+---------------+---------+-----------+----------+--------------+ FV DistalFull                                                        +---------+---------------+---------+-----------+----------+--------------+ PFV      Full                                                        +---------+---------------+---------+-----------+----------+--------------+ POP      Full           Yes      Yes                                 +---------+---------------+---------+-----------+----------+--------------+ PTV      Full                                                        +---------+---------------+---------+-----------+----------+--------------+ PERO     Partial                                      Acute          +---------+---------------+---------+-----------+----------+--------------+     Summary: RIGHT: - Findings consistent with acute deep vein thrombosis involving the right popliteal vein, and right peroneal veins.  - No cystic structure found in the popliteal fossa.  LEFT: - Findings consistent with acute deep vein thrombosis involving the left peroneal veins.  - No cystic structure found in  the popliteal fossa.  *See table(s) above for measurements and observations. Electronically signed by Angela Kell MD on 01/31/2024 at 5:14:49 PM.    Final    CT Angio Chest PE W and/or Wo Contrast Result Date: 01/31/2024 CLINICAL DATA:  Chest pain. Recent hospital admission. Pain is increased with inspiration. EXAM: CT ANGIOGRAPHY CHEST  WITH CONTRAST TECHNIQUE: Multidetector CT imaging of the chest was performed using the standard protocol during bolus administration of intravenous contrast. Multiplanar CT image reconstructions and MIPs were obtained to evaluate the vascular anatomy. RADIATION DOSE REDUCTION: This exam was performed according to the departmental dose-optimization program which includes automated exposure control, adjustment of the mA and/or kV according to patient size and/or use of iterative reconstruction technique. CONTRAST:  75mL OMNIPAQUE IOHEXOL 350 MG/ML SOLN COMPARISON:  None Available. FINDINGS: Cardiovascular: There is satisfactory opacification of the pulmonary arteries. Thrombus is identified at the bifurcation of the distal right main pulmonary artery, image 65/6. Thrombus extends into the right lower lobe are, segmental and subsegmental pulmonary arteries. Small filling defect is also identified within a segmental branch of the left upper lobe pulmonary artery. The RV to LV ratio is equal to 40.39 mm/38.34 mm = 1.04. This is compatible with mild right heart strain. Coronary artery calcifications. No significant aortic atherosclerotic calcifications. Mediastinum/Nodes: Thyroid gland and trachea are unremarkable. Circumferential wall thickening involving the distal esophagus noted, image 94/6. No enlarged mediastinal or hilar lymph nodes. Lungs/Pleura: Small bilateral pleural effusions. There is peripheral ground-glass attenuation and mild consolidative change within the right lower lobe compatible with infarct. Areas of subsegmental atelectasis noted in the left base. Upper Abdomen: No acute abnormality. Musculoskeletal: No acute or suspicious osseous findings. Review of the MIP images confirms the above findings. IMPRESSION: 1. Examination is positive for acute pulmonary embolism with thrombus identified at the bifurcation of the distal right main pulmonary artery. Thrombus extends into the right lower lobar,  segmental and subsegmental pulmonary arteries. Small filling defect is also identified within a segmental branch of the left upper lobe pulmonary artery. 2. RV to LV ratio is equal to 40.39 mm/38.34 mm = 1.04. This is compatible with mild right heart strain. 3. Small bilateral pleural effusions. 4. Peripheral ground-glass attenuation and mild consolidative change within the right lower lobe compatible with infarct. 5. Circumferential wall thickening involving the distal esophagus. Correlate for any clinical signs or symptoms of esophagitis. 6. Coronary artery calcifications. 7.  Aortic Atherosclerosis (ICD10-I70.0). Critical Value/emergent results were called by telephone at the time of interpretation on 01/31/2024 at 6:41 am to provider Dr. April Palumbo, who verbally acknowledged these results. Electronically Signed   By: Kimberley Penman M.D.   On: 01/31/2024 06:42   DG Chest 2 View Result Date: 01/30/2024 CLINICAL DATA:  Chest pain.  Right-sided chest pain for 24 hours. EXAM: CHEST - 2 VIEW COMPARISON:  01/30/2024 FINDINGS: Shallow inspiration. Cardiac enlargement with mild pulmonary vascular congestion. Small bilateral pleural effusions. No airspace disease or consolidation. No pneumothorax. Mediastinal contours appear intact. IMPRESSION: Cardiac enlargement with pulmonary vascular congestion and small bilateral pleural effusions. No edema or consolidation is identified. Electronically Signed   By: Boyce Byes M.D.   On: 01/30/2024 23:58        Scheduled Meds:  allopurinol   300 mg Oral Daily   carbamazepine   400 mg Oral QHS   carvedilol  6.25 mg Oral BID WC   Chlorhexidine  Gluconate Cloth  6 each Topical Daily   levothyroxine   75 mcg Oral QAC breakfast   Continuous  Infusions:  heparin  2,500 Units/hr (02/01/24 0950)     LOS: 1 day    Time spent: 55 minutes    Vada Garibaldi, MD [facility]

## 2024-02-01 NOTE — TOC Initial Note (Signed)
 Transition of Care [facility]) - Initial/Assessment Note    Patient Details  Name: Juan Clarke MRN: [id number] Date of Birth: [birth year]  [facility]) CM/SW Contact:    Tessie Fila, RN Phone Number: 02/01/2024, 1:23 PM  Clinical Narrative:                 Pt is from home. Discharged from hospital 1 day ago. Pt had plan to admit into [facility] today at 12:30p. [facility] with staff at [facility] and she states once pt is medically stable and still wishes to admit, for [facility] staff to fax over new referral, and they will accept him. TOC will continue to follow for DC needs.  Expected Discharge Plan: Home/Self Care Barriers to Discharge: Continued Medical Work up   Patient Goals and CMS Choice Patient states their goals for this hospitalization and ongoing recovery are:: Return home CMS Medicare.gov Compare Post Acute Care list provided to:: Other (Comment Required) (NA) Choice offered to / list presented to : NA [place] ownership interest in [facility].provided to:: Parent NA    Expected Discharge Plan and Services In-house Referral: NA Discharge Planning Services: CM Consult Post Acute Care Choice: NA (NA) Living arrangements for the past 2 months: Single Family Home                 DME Arranged: N/A DME Agency: NA       HH Arranged: NA HH Agency: NA        Prior Living Arrangements/Services Living arrangements for the past 2 months: Single Family Home Lives with:: Self Patient language and need for interpreter reviewed:: Yes Do you feel safe going back to the place where you live?: Yes      Need for Family Participation in Patient Care: No (Comment) Care giver support system in place?: No (comment) Current home services: Other (comment) (NA) Criminal Activity/Legal Involvement Pertinent to Current Situation/Hospitalization: No - Comment as needed  Activities of Daily Living   ADL Screening (condition at time of admission) Independently  performs ADLs?: Yes (appropriate for developmental age) Is the patient deaf or have difficulty hearing?: No Does the patient have difficulty seeing, even when wearing glasses/contacts?: No Does the patient have difficulty concentrating, remembering, or making decisions?: No  Permission Sought/Granted Permission sought to share information with : Family Supports Permission granted to share information with : Yes, Verbal Permission Granted  Share Information with NAME: Conrad, [name] (Mother)  [phone]           Emotional Assessment Appearance:: Other (Comment Required (Unable to assess) Attitude/Demeanor/Rapport: Unable to Assess Affect (typically observed): Unable to Assess Orientation: : Oriented to Self, Oriented to Place, Oriented to  Time, Oriented to Situation Alcohol / Substance Use: Not Applicable Psych Involvement: No (comment)  Admission diagnosis:  Pulmonary embolism (HCC) [I26.99] 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] Other acute pulmonary embolism without acute cor pulmonale (HCC) [I26.99] Patient Active Problem List   Diagnosis Date Noted   Pulmonary embolism (HCC) 01/31/2024   Prolonged QT interval 01/31/2024   Acquired hypothyroidism 01/31/2024   Alcoholism (HCC) 01/31/2024   Elevated liver enzymes 01/31/2024   Essential hypertension 01/31/2024   Mixed hyperlipidemia 01/31/2024   Class 3 obesity 01/31/2024   Acute bilateral deep vein thrombosis (DVT) of popliteal veins (HCC) 01/31/2024   Class 2 obesity 01/31/2024   Serotonin syndrome 01/24/2024   Alcohol withdrawal delirium (HCC) 01/18/2024   Hypokalemia 08/15/2023   Acute liver failure without hepatic coma 08/11/2023   Alcohol  withdrawal syndrome, with delirium (HCC) 08/11/2023   Acute hepatic encephalopathy (HCC) 08/11/2023   Altered mental status 08/10/2023   Toxic metabolic encephalopathy 08/10/2023   Acute metabolic encephalopathy 08/10/2023   ETOH abuse 11/28/2022   Neck pain 02/25/2015   Headache  02/25/2015   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 [place] [address] Phone: [phone] Fax: [phone]     Social Drivers of Health (SDOH) Social History: SDOH Screenings   Food Insecurity: No Food Insecurity (01/31/2024)  Housing: Low Risk  (01/31/2024)  Transportation Needs: No Transportation Needs (01/31/2024)  Utilities: Not At Risk (01/31/2024)  Social Connections: Unknown (01/11/2022)   Received from [facility]  Tobacco Use: Low Risk  (01/30/2024)   SDOH Interventions:     Readmission Risk Interventions    02/01/2024    1:16 PM 01/20/2024   12:44 PM 01/20/2024    9:59 AM  Readmission Risk Prevention Plan  Transportation Screening Complete Complete Complete  PCP or Specialist Appt within 3-5 Days  Complete Complete  HRI or Home Care Consult  Complete   Social Work Consult for Recovery Care Planning/Counseling  Complete   Palliative Care Screening   Complete  Medication Review Oceanographer) Complete Complete   PCP or Specialist appointment within 3-5 days of discharge Complete    HRI or Home Care Consult Complete    SW Recovery Care/Counseling Consult Complete    Palliative Care Screening Not Applicable    Skilled Nursing Facility Complete

## 2024-02-01 NOTE — Progress Notes (Signed)
 Patients PIV with heparin  infiltrated and was causing discomfort. Heparin  Gtts was stopped  and PIV was removed. pharmacy and Md was made aware.   Lelon Putty

## 2024-02-01 NOTE — Telephone Encounter (Signed)
Patient Product/process development scientist completed.    The patient is insured through [facility]. Patient has [employer], may use a copay card, and/or apply for patient assistance if available.    Ran test claim for Eliquis 5 mg and the current 30 day co-pay is $30.00.  Ran test claim for Xarelto 20 mg and the current 30 day co-pay is $30.00.  This test claim was processed through [facility]- copay amounts may vary at other pharmacies due to pharmacy/plan contracts, or as the patient moves through the different stages of their insurance plan.     Roland Earl, CPHT Pharmacy Technician III Certified Patient Advocate [facility] Patient Advocate Team Direct Number: [phone]  Fax: [phone]

## 2024-02-02 ENCOUNTER — Other Ambulatory Visit (HOSPITAL_COMMUNITY): Payer: Self-pay

## 2024-02-02 DIAGNOSIS — I2609 Other pulmonary embolism with acute cor pulmonale: Secondary | ICD-10-CM | POA: Diagnosis not present

## 2024-02-02 DIAGNOSIS — I82433 Acute embolism and thrombosis of popliteal vein, bilateral: Secondary | ICD-10-CM | POA: Diagnosis not present

## 2024-02-02 LAB — COMPREHENSIVE METABOLIC PANEL WITH GFR
ALT: 105 U/L — ABNORMAL HIGH (ref 0–44)
AST: 46 U/L — ABNORMAL HIGH (ref 15–41)
Albumin: 2.8 g/dL — ABNORMAL LOW (ref 3.5–5.0)
Alkaline Phosphatase: 198 U/L — ABNORMAL HIGH (ref 38–126)
Anion gap: 10 (ref 5–15)
BUN: 6 mg/dL (ref 6–20)
CO2: 23 mmol/L (ref 22–32)
Calcium: 8.7 mg/dL — ABNORMAL LOW (ref 8.9–10.3)
Chloride: 96 mmol/L — ABNORMAL LOW (ref 98–111)
Creatinine, Ser: <0.3 mg/dL — ABNORMAL LOW (ref 0.61–1.24)
Glucose, Bld: 111 mg/dL — ABNORMAL HIGH (ref 70–99)
Potassium: 4.2 mmol/L (ref 3.5–5.1)
Sodium: 129 mmol/L — ABNORMAL LOW (ref 135–145)
Total Bilirubin: 1.3 mg/dL — ABNORMAL HIGH (ref 0.0–1.2)
Total Protein: 6.6 g/dL (ref 6.5–8.1)

## 2024-02-02 LAB — MAGNESIUM: Magnesium: 2.6 mg/dL — ABNORMAL HIGH (ref 1.7–2.4)

## 2024-02-02 LAB — PHOSPHORUS: Phosphorus: 3 mg/dL (ref 2.5–4.6)

## 2024-02-02 MED ORDER — HYDROCODONE-ACETAMINOPHEN 10-325 MG PO TABS
1.0000 | ORAL_TABLET | ORAL | Status: DC | PRN
  Administered 2024-02-02 – 2024-02-04 (×8): 1 via ORAL
  Filled 2024-02-02 (×8): qty 1

## 2024-02-02 MED ORDER — HYDROMORPHONE HCL 1 MG/ML IJ SOLN
0.5000 mg | INTRAMUSCULAR | Status: AC | PRN
  Administered 2024-02-02 – 2024-02-03 (×5): 1 mg via INTRAVENOUS
  Filled 2024-02-02 (×5): qty 1

## 2024-02-02 MED ORDER — APIXABAN (ELIQUIS) VTE STARTER PACK (10MG AND 5MG)
ORAL_TABLET | ORAL | 0 refills | Status: DC
  Filled 2024-02-02: qty 74, 30d supply, fill #0

## 2024-02-02 MED ORDER — ACETAMINOPHEN 325 MG PO TABS
650.0000 mg | ORAL_TABLET | Freq: Four times a day (QID) | ORAL | Status: DC | PRN
  Administered 2024-02-02: 650 mg via ORAL
  Filled 2024-02-02: qty 2

## 2024-02-02 NOTE — Plan of Care (Signed)

## 2024-02-02 NOTE — TOC Progression Note (Signed)
 Transition of Care [facility]) - Progression Note    Patient Details  Name: Juan Clarke MRN: [id number] Date of Birth: [birth year]  Transition of Care [facility]) CM/SW Contact  Tessie Fila, RN Phone Number: 02/02/2024, 2:01 PM  Clinical Narrative:    NCM met with pt at bedside to discuss him going to [facility] at discharge and if he is agreeable to [facility] sending referral. Pt is agreeable to send out referral but states that once discharged he will not go door to door, because he needs 2 to 3 days to get his self in order to go.  NCM sent out referral, awaiting a response from facility. TOC continuing to follow.    Expected Discharge Plan: Home/Self Care Barriers to Discharge: Continued Medical Work up  Expected Discharge Plan and Services In-house Referral: NA Discharge Planning Services: CM Consult Post Acute Care Choice: NA (NA) Living arrangements for the past 2 months: Single Family Home                 DME Arranged: N/A DME Agency: NA       HH Arranged: NA HH Agency: NA         Social Determinants of Health (SDOH) Interventions SDOH Screenings   Food Insecurity: No Food Insecurity (01/31/2024)  Housing: Low Risk  (01/31/2024)  Transportation Needs: No Transportation Needs (01/31/2024)  Utilities: Not At Risk (01/31/2024)  Social Connections: Unknown (01/11/2022)   Received from [facility]  Tobacco Use: Low Risk  (01/30/2024)    Readmission Risk Interventions    02/01/2024    1:16 PM 01/20/2024   12:44 PM 01/20/2024    9:59 AM  Readmission Risk Prevention Plan  Transportation Screening Complete Complete Complete  PCP or Specialist Appt within 3-5 Days  Complete Complete  HRI or Home Care Consult  Complete   Social Work Consult for Recovery Care Planning/Counseling  Complete   Palliative Care Screening   Complete  Medication Review Oceanographer) Complete Complete   PCP or Specialist appointment within 3-5 days of discharge Complete    HRI or  Home Care Consult Complete    SW Recovery Care/Counseling Consult Complete    Palliative Care Screening Not Applicable    Skilled Nursing Facility Complete

## 2024-02-02 NOTE — Progress Notes (Signed)
 PROGRESS NOTE    Juan Clarke  ZOX:[id number] DOB: Jun 16, 1981 DOA: 01/30/2024 PCP: Perley Bradley, MD    Brief Narrative:  43 year old male with history of generalized anxiety disorder, alcohol abuse who was discharged 1 day ago after 13 days of hospitalization for alcohol withdrawal syndrome, metabolic encephalopathy, mechanical ventilation and extubation.  He had some chest discomfort on discharge however he was stable.  He came back to the hospital complaining of worsening chest pain with shortness of breath.  In the emergency room hemodynamically stable.  Patient was found to have acute PE right main pulmonary artery with right ventricular strain, infarction of the lung.  Also found to have lower extremity DVT.  Admitted on IV heparin .  Subjective: Patient seen and examined.  Temperature 103 overnight but overall feels more comfortable today.  He still has significant lower chest wall pain on deep breathing.  Trying to wean off to room air.  No other overnight events.  Remains sinus rhythm with controlled heart rate on monitor.  Assessment & Plan:    Acute PE with cor pulmonale Acute right lower extremity DVT Patient currently hemodynamically stable.  On minimal oxygen to room air.   Echocardiogram without evidence of right ventricular strain. With clinical stability, heparin  converted to Lovenox  for ease of administration.  Still has significant chest wall pain and spiking fever.  Continue Lovenox  today.  patient is not ready to transition to DOAC. Incentive spirometry, breathing exercises, mobility and adequate pain control.  Anticipate discharge on Eliquis when medically stable.  Fever: Suspect metabolic fever due to pulmonary infarction.  Physiotherapy. No evidence of active infection.  Hypothyroidism: On Synthroid .  Hypertension, on Coreg.  Alcohol abuse, anxiety disorder: Currently needing as needed medications.  He is total abstinence for last 2 weeks.  Looking forward to  continue outpatient rehab.  Transfer to telemetry bed.  Mobilize.  Anticipate home tomorrow.     DVT prophylaxis: Lovenox  subcu.   Code Status: Full code Family Communication: None at the bedside.  Mother updated. Disposition Plan: Status is: Inpatient Remains inpatient appropriate because: Severe significant symptoms,      Consultants:  None  Procedures:  None  Antimicrobials:  None     Objective: Vitals:   02/02/24 0302 02/02/24 0400 02/02/24 0500 02/02/24 0600  BP: (!) 163/81 (!) 148/61 (!) 149/57 (!) 148/72  Pulse: (!) 107 (!) 108 (!) 107 (!) 101  Resp: (!) 23 (!) 23 (!) 22 20  Temp:  (!) 103 F (39.4 C) 99 F (37.2 C)   TempSrc:  Oral Oral   SpO2: 96% 96% 96% 96%  Weight:   119.8 kg   Height:        Intake/Output Summary (Last 24 hours) at 02/02/2024 0815 Last data filed at 02/02/2024 0500 Gross per 24 hour  Intake 1941.06 ml  Output 2200 ml  Net -258.94 ml   Filed Weights   01/31/24 1500 02/02/24 0500  Weight: 120.9 kg 119.8 kg    Examination:  General exam: Appears calm and comfortable.  Flat affect. Respiratory system: Clear to auscultation. Respiratory effort reduced.  No added sounds. Cardiovascular system: S1 & S2 heard, RRR.  No edema. Gastrointestinal system: Abdomen is nondistended, soft and nontender. No organomegaly or masses felt. Normal bowel sounds heard. Central nervous system: Alert and oriented. No focal neurological deficits. Extremities: Symmetric 5 x 5 power.  No edema or swelling.     Data Reviewed: I have personally reviewed following labs and imaging studies  CBC: Recent Labs  Lab 01/28/24 0724 01/29/24 1042 01/30/24 0921 01/30/24 2344 02/01/24 0105  WBC 9.1 11.3* 13.2* 13.3* 12.7*  NEUTROABS  --   --   --  9.4*  --   HGB 10.3* 11.7* 11.9* 12.7* 11.2*  HCT 32.0* 35.9* 35.2* 37.5* 35.1*  MCV 94.1 91.8 89.3 88.2 93.9  PLT 268 248 255 307 279   Basic Metabolic Panel: Recent Labs  Lab 01/26/24 1604  01/27/24 0357 01/27/24 0357 01/28/24 0724 01/29/24 1042 01/30/24 0921 01/30/24 2344 01/31/24 0416 01/31/24 1610 02/01/24 0105 02/02/24 0507  NA  --  137   < > 134*   < > 131* 129* 128*  --  131* 129*  K  --  3.6   < > 4.0   < > 3.4* 3.8 3.7  --  4.4 4.2  CL  --  106   < > 103   < > 97* 95* 94*  --  97* 96*  CO2  --  25   < > 23   < > 22 20* 21*  --  22 23  GLUCOSE  --  122*   < > 114*   < > 105* 112* 111*  --  94 111*  BUN  --  16   < > 16   < > 18 22* 22*  --  14 6  CREATININE  --  0.88   < > 0.97   < > 1.02 0.96 1.32*  --  0.89 <0.30*  CALCIUM   --  8.3*   < > 8.3*   < > 9.0 9.2 9.2  --  8.6* 8.7*  MG 2.4 2.4  --  2.5*  --   --   --   --  2.3  --  2.6*  PHOS 3.7 3.4  --  4.5  --   --   --   --  4.4  --  3.0   < > = values in this interval not displayed.   GFR: CrCl cannot be calculated (This lab value cannot be used to calculate CrCl because it is not a number: <0.30). Liver Function Tests: Recent Labs  Lab 01/27/24 0357 01/31/24 0416 02/01/24 0105 02/02/24 0507  AST 85* 126* 75* 46*  ALT 122* 239* 172* 105*  ALKPHOS 204* 284* 247* 198*  BILITOT 0.6 0.9 1.2 1.3*  PROT 6.6 8.1 7.1 6.6  ALBUMIN 2.7* 4.0 3.3* 2.8*   No results for input(s): "LIPASE", "AMYLASE" in the last 168 hours. Recent Labs  Lab 01/29/24 1042  AMMONIA 38*   Coagulation Profile: Recent Labs  Lab 01/31/24 0416  INR 1.2   Cardiac Enzymes: Recent Labs  Lab 01/27/24 0357 01/31/24 1610  CKTOTAL 419* 112   BNP (last 3 results) No results for input(s): "PROBNP" in the last 8760 hours. HbA1C: No results for input(s): "HGBA1C" in the last 72 hours. CBG: Recent Labs  Lab 01/29/24 2001 01/30/24 0029 01/30/24 0412 01/30/24 0816 01/30/24 1150  GLUCAP 98 101* 101* 88 94   Lipid Profile: No results for input(s): "CHOL", "HDL", "LDLCALC", "TRIG", "CHOLHDL", "LDLDIRECT" in the last 72 hours. Thyroid Function Tests: No results for input(s): "TSH", "T4TOTAL", "FREET4", "T3FREE", "THYROIDAB" in  the last 72 hours. Anemia Panel: No results for input(s): "VITAMINB12", "FOLATE", "FERRITIN", "TIBC", "IRON", "RETICCTPCT" in the last 72 hours. Sepsis Labs: Recent Labs  Lab 01/31/24 0406  LATICACIDVEN 0.8    Recent Results (from the past 240 hours)  Respiratory (~20 pathogens) panel by PCR     Status: None  Collection Time: 01/24/24 11:48 AM   Specimen: Nasopharyngeal Swab; Respiratory  Result Value Ref Range Status   Adenovirus NOT DETECTED NOT DETECTED Final   Coronavirus 229E NOT DETECTED NOT DETECTED Final    Comment: (NOTE) The Coronavirus on the Respiratory Panel, DOES NOT test for the novel  Coronavirus (2019 nCoV)    Coronavirus HKU1 NOT DETECTED NOT DETECTED Final   Coronavirus NL63 NOT DETECTED NOT DETECTED Final   Coronavirus OC43 NOT DETECTED NOT DETECTED Final   Metapneumovirus NOT DETECTED NOT DETECTED Final   Rhinovirus / Enterovirus NOT DETECTED NOT DETECTED Final   Influenza A NOT DETECTED NOT DETECTED Final   Influenza B NOT DETECTED NOT DETECTED Final   Parainfluenza Virus 1 NOT DETECTED NOT DETECTED Final   Parainfluenza Virus 2 NOT DETECTED NOT DETECTED Final   Parainfluenza Virus 3 NOT DETECTED NOT DETECTED Final   Parainfluenza Virus 4 NOT DETECTED NOT DETECTED Final   Respiratory Syncytial Virus NOT DETECTED NOT DETECTED Final   Bordetella pertussis NOT DETECTED NOT DETECTED Final   Bordetella Parapertussis NOT DETECTED NOT DETECTED Final   Chlamydophila pneumoniae NOT DETECTED NOT DETECTED Final   Mycoplasma pneumoniae NOT DETECTED NOT DETECTED Final    Comment: Performed at [facility], [address] [place], [place] [address]  Blood Culture (routine x 2)     Status: None (Preliminary result)   Collection Time: 01/31/24  3:46 AM   Specimen: BLOOD  Result Value Ref Range Status   Specimen Description   Final    BLOOD BLOOD RIGHT FOREARM Performed at [facility], [address] [place], [place] [address]    Special  Requests   Final    BOTTLES DRAWN AEROBIC AND ANAEROBIC Blood Culture adequate volume Performed at [facility], [address] [place], [place] [address]    Culture   Final    NO GROWTH 1 DAY Performed at [facility], [address] [place], [place] [address]    Report Status PENDING  Incomplete  Resp panel by RT-PCR (RSV, Flu A&B, Covid)     Status: None   Collection Time: 01/31/24  4:15 AM   Specimen: Nasal Swab  Result Value Ref Range Status   SARS Coronavirus 2 by RT PCR NEGATIVE NEGATIVE Final    Comment: (NOTE) SARS-CoV-2 target nucleic acids are NOT DETECTED.  The SARS-CoV-2 RNA is generally detectable in upper respiratory specimens during the acute phase of infection. The lowest concentration of SARS-CoV-2 viral copies this assay can detect is 138 copies/mL. A negative result does not preclude SARS-Cov-2 infection and should not be used as the sole basis for treatment or other patient management decisions. A negative result may occur with  improper specimen collection/handling, submission of specimen other than nasopharyngeal swab, presence of viral mutation(s) within the areas targeted by this assay, and inadequate number of viral copies(<138 copies/mL). A negative result must be combined with clinical observations, patient history, and epidemiological information. The expected result is Negative.  Fact Sheet for Patients:  BloggerCourse.com  Fact Sheet for Healthcare Providers:  SeriousBroker.it  This test is no t yet approved or cleared by the [place]  FDA and  has been authorized for detection and/or diagnosis of SARS-CoV-2 by FDA under an Emergency Use Authorization (EUA). This EUA will remain  in effect (meaning this test can be used) for the duration of the COVID-19 declaration under Section 564(b)(1) of the Act, 21 U.S.C.section 360bbb-3(b)(1), unless the authorization is  terminated  or revoked sooner.       Influenza A by PCR NEGATIVE NEGATIVE Final   Influenza B by PCR NEGATIVE NEGATIVE Final    Comment: (NOTE) The Xpert Xpress SARS-CoV-2/FLU/RSV plus assay is intended as an aid in the diagnosis of influenza from Nasopharyngeal swab specimens and should not be used as a sole basis for treatment. Nasal washings and aspirates are unacceptable for Xpert Xpress SARS-CoV-2/FLU/RSV testing.  Fact Sheet for Patients: BloggerCourse.com  Fact Sheet for Healthcare Providers: SeriousBroker.it  This test is not yet approved or cleared by the [place]  FDA and has been authorized for detection and/or diagnosis of SARS-CoV-2 by FDA under an Emergency Use Authorization (EUA). This EUA will remain in effect (meaning this test can be used) for the duration of the COVID-19 declaration under Section 564(b)(1) of the Act, 21 U.S.C. section 360bbb-3(b)(1), unless the authorization is terminated or revoked.     Resp Syncytial Virus by PCR NEGATIVE NEGATIVE Final    Comment: (NOTE) Fact Sheet for Patients: BloggerCourse.com  Fact Sheet for Healthcare Providers: SeriousBroker.it  This test is not yet approved or cleared by the [place]  FDA and has been authorized for detection and/or diagnosis of SARS-CoV-2 by FDA under an Emergency Use Authorization (EUA). This EUA will remain in effect (meaning this test can be used) for the duration of the COVID-19 declaration under Section 564(b)(1) of the Act, 21 U.S.C. section 360bbb-3(b)(1), unless the authorization is terminated or revoked.  Performed at [facility], [address], [place], [place] [address]   Blood Culture (routine x 2)     Status: None (Preliminary result)   Collection Time: 01/31/24  4:10 PM   Specimen: BLOOD RIGHT HAND  Result Value Ref Range Status   Specimen  Description   Final    BLOOD RIGHT HAND Performed at [facility], [address] [place], [place] [address]    Special Requests   Final    BOTTLES DRAWN AEROBIC AND ANAEROBIC Blood Culture adequate volume Performed at [facility], [address] [place], [place] [address]    Culture   Final    NO GROWTH < 24 HOURS Performed at [facility], [address], [place], [place] [address]    Report Status PENDING  Incomplete         Radiology Studies: ECHOCARDIOGRAM COMPLETE Result Date: 02/01/2024    ECHOCARDIOGRAM REPORT   Patient Name:   ALEKSANDR PELLOW Date of Exam: 02/01/2024 Medical Rec #:  [id number]        Height:       72.0 in Accession #:    [id number]       Weight:       266.5 lb Date of Birth:  08-09-1981        BSA:          2.407 m Patient Age:    43 years         BP:           154/52 mmHg Patient Gender: M                HR:           109 bpm. Exam Location:  Inpatient Procedure: 2D Echo, Color Doppler and Cardiac Doppler (Both Spectral and Color            Flow Doppler were utilized during procedure). Indications:    I26.02 Pulmonary embolus  History:  Patient has no prior history of Echocardiogram examinations.                 Risk Factors:Hypertension.  Sonographer:    Andrena Bang Referring Phys: [id number] DAVID MANUEL ORTIZ  Sonographer Comments: Pt short of breath during exam. IMPRESSIONS  1. Left ventricular ejection fraction, by estimation, is 70 to 75%. The left ventricle has hyperdynamic function. The left ventricle has no regional wall motion abnormalities. There is moderate concentric left ventricular hypertrophy. Left ventricular diastolic parameters are indeterminate.  2. Right ventricular systolic function is normal. The right ventricular size is normal. Tricuspid regurgitation signal is inadequate for assessing PA pressure.  3. The mitral valve is normal in structure. Trivial mitral valve regurgitation. No evidence of mitral stenosis.   4. The aortic valve is normal in structure. Aortic valve regurgitation is not visualized. No aortic stenosis is present.  5. The inferior vena cava is dilated in size with >50% respiratory variability, suggesting right atrial pressure of 8 mmHg. FINDINGS  Left Ventricle: Left ventricular ejection fraction, by estimation, is 70 to 75%. The left ventricle has hyperdynamic function. The left ventricle has no regional wall motion abnormalities. Definity contrast agent was given IV to delineate the left ventricular endocardial borders. The left ventricular internal cavity size was normal in size. There is moderate concentric left ventricular hypertrophy. Left ventricular diastolic parameters are indeterminate. Right Ventricle: The right ventricular size is normal. No increase in right ventricular wall thickness. Right ventricular systolic function is normal. Tricuspid regurgitation signal is inadequate for assessing PA pressure. Left Atrium: Left atrial size was normal in size. Right Atrium: Right atrial size was normal in size. Pericardium: There is no evidence of pericardial effusion. Mitral Valve: The mitral valve is normal in structure. Trivial mitral valve regurgitation. No evidence of mitral valve stenosis. Tricuspid Valve: The tricuspid valve is normal in structure. Tricuspid valve regurgitation is trivial. No evidence of tricuspid stenosis. Aortic Valve: The aortic valve is normal in structure. Aortic valve regurgitation is not visualized. No aortic stenosis is present. Aortic valve mean gradient measures 5.0 mmHg. Aortic valve peak gradient measures 9.2 mmHg. Aortic valve area, by VTI measures 2.84 cm. Pulmonic Valve: The pulmonic valve was normal in structure. Pulmonic valve regurgitation is not visualized. No evidence of pulmonic stenosis. Aorta: The aortic root is normal in size and structure. Venous: The inferior vena cava is dilated in size with greater than 50% respiratory variability, suggesting right  atrial pressure of 8 mmHg. IAS/Shunts: No atrial level shunt detected by color flow Doppler.  LEFT VENTRICLE PLAX 2D LVIDd:         4.00 cm      Diastology LVIDs:         2.30 cm      LV e' medial:    6.84 cm/s LV PW:         1.70 cm      LV E/e' medial:  11.2 LV IVS:        1.50 cm      LV e' lateral:   15.70 cm/s LVOT diam:     2.00 cm      LV E/e' lateral: 4.9 LV SV:         67 LV SV Index:   28 LVOT Area:     3.14 cm  LV Volumes (MOD) LV vol d, MOD A2C: 119.0 ml LV vol d, MOD A4C: 104.0 ml LV vol s, MOD A2C: 33.3 ml LV vol s, MOD A4C: 33.5  ml LV SV MOD A2C:     85.7 ml LV SV MOD A4C:     104.0 ml LV SV MOD BP:      79.8 ml RIGHT VENTRICLE RV S prime:     20.50 cm/s TAPSE (M-mode): 2.0 cm LEFT ATRIUM             Index LA Vol (A2C):   17.7 ml 7.35 ml/m LA Vol (A4C):   46.9 ml 19.48 ml/m LA Biplane Vol: 29.4 ml 12.21 ml/m  AORTIC VALVE AV Area (Vmax):    2.50 cm AV Area (Vmean):   2.38 cm AV Area (VTI):     2.84 cm AV Vmax:           152.00 cm/s AV Vmean:          103.000 cm/s AV VTI:            0.237 m AV Peak Grad:      9.2 mmHg AV Mean Grad:      5.0 mmHg LVOT Vmax:         121.00 cm/s LVOT Vmean:        78.000 cm/s LVOT VTI:          0.214 m LVOT/AV VTI ratio: 0.90  AORTA Ao Asc diam: 3.20 cm MITRAL VALVE MV Area (PHT): 4.71 cm    SHUNTS MV Decel Time: 161 msec    Systemic VTI:  0.21 m MV E velocity: 76.30 cm/s  Systemic Diam: 2.00 cm MV A velocity: 67.30 cm/s MV E/A ratio:  1.13 Jules Oar MD Electronically signed by Jules Oar MD Signature Date/Time: 02/01/2024/9:12:39 AM    Final    VAS US  LOWER EXTREMITY VENOUS (DVT) Result Date: 01/31/2024  Lower Venous DVT Study Patient Name:  CLERENCE GUBSER  Date of Exam:   01/31/2024 Medical Rec #: [id number]         Accession #:    [id number] Date of Birth: [birth year]         Patient Gender: M Patient Age:   71 years Exam Location:  [facility] Procedure:      VAS US  LOWER EXTREMITY VENOUS (DVT) Referring Phys: DAVID ORTIZ  --------------------------------------------------------------------------------  Indications: Pulmonary embolism.  Risk Factors: Confirmed PE. Anticoagulation: Heparin . Comparison Study: No prior studies. Performing Technologist: Lerry Ransom RVT  Examination Guidelines: A complete evaluation includes B-mode imaging, spectral Doppler, color Doppler, and power Doppler as needed of all accessible portions of each vessel. Bilateral testing is considered an integral part of a complete examination. Limited examinations for reoccurring indications may be performed as noted. The reflux portion of the exam is performed with the patient in reverse Trendelenburg.  +---------+---------------+---------+-----------+----------+--------------+ RIGHT    CompressibilityPhasicitySpontaneityPropertiesThrombus Aging +---------+---------------+---------+-----------+----------+--------------+ CFV      Full           Yes      Yes                                 +---------+---------------+---------+-----------+----------+--------------+ SFJ      Full                                                        +---------+---------------+---------+-----------+----------+--------------+ FV Prox  Full                                                        +---------+---------------+---------+-----------+----------+--------------+  FV Mid   Full                                                        +---------+---------------+---------+-----------+----------+--------------+ FV DistalFull                                                        +---------+---------------+---------+-----------+----------+--------------+ PFV      Full                                                        +---------+---------------+---------+-----------+----------+--------------+ POP      None           No       No                   Acute           +---------+---------------+---------+-----------+----------+--------------+ PTV      Full                                                        +---------+---------------+---------+-----------+----------+--------------+ PERO     None                                         Acute          +---------+---------------+---------+-----------+----------+--------------+ Gastroc  Full                                                        +---------+---------------+---------+-----------+----------+--------------+   +---------+---------------+---------+-----------+----------+--------------+ LEFT     CompressibilityPhasicitySpontaneityPropertiesThrombus Aging +---------+---------------+---------+-----------+----------+--------------+ CFV      Full           Yes      Yes                                 +---------+---------------+---------+-----------+----------+--------------+ SFJ      Full                                                        +---------+---------------+---------+-----------+----------+--------------+ FV Prox  Full                                                        +---------+---------------+---------+-----------+----------+--------------+  FV Mid   Full                                                        +---------+---------------+---------+-----------+----------+--------------+ FV DistalFull                                                        +---------+---------------+---------+-----------+----------+--------------+ PFV      Full                                                        +---------+---------------+---------+-----------+----------+--------------+ POP      Full           Yes      Yes                                 +---------+---------------+---------+-----------+----------+--------------+ PTV      Full                                                         +---------+---------------+---------+-----------+----------+--------------+ PERO     Partial                                      Acute          +---------+---------------+---------+-----------+----------+--------------+     Summary: RIGHT: - Findings consistent with acute deep vein thrombosis involving the right popliteal vein, and right peroneal veins.  - No cystic structure found in the popliteal fossa.  LEFT: - Findings consistent with acute deep vein thrombosis involving the left peroneal veins.  - No cystic structure found in the popliteal fossa.  *See table(s) above for measurements and observations. Electronically signed by Angela Kell MD on 01/31/2024 at 5:14:49 PM.    Final         Scheduled Meds:  allopurinol   300 mg Oral Daily   carbamazepine   400 mg Oral QHS   carvedilol  6.25 mg Oral BID WC   Chlorhexidine  Gluconate Cloth  6 each Topical Daily   enoxaparin  (LOVENOX ) injection  1 mg/kg Subcutaneous Q12H   levothyroxine   75 mcg Oral QAC breakfast   Continuous Infusions:     LOS: 2 days    Time spent: 55 minutes    Vada Garibaldi, MD [facility]

## 2024-02-02 NOTE — Plan of Care (Signed)

## 2024-02-02 NOTE — Progress Notes (Signed)
 Patient continues to complain of right sternum muscle pain, rates pain 8/10 PRN dilaudid  1 mg IV given with little to no relief, also patient has temp of 103, on call provider made aware, PRN tylenol  given.

## 2024-02-03 ENCOUNTER — Other Ambulatory Visit (HOSPITAL_COMMUNITY): Payer: Self-pay

## 2024-02-03 LAB — CBC WITH DIFFERENTIAL/PLATELET
Abs Immature Granulocytes: 0.1 10*3/uL — ABNORMAL HIGH (ref 0.00–0.07)
Basophils Absolute: 0.1 10*3/uL (ref 0.0–0.1)
Basophils Relative: 1 %
Eosinophils Absolute: 0.1 10*3/uL (ref 0.0–0.5)
Eosinophils Relative: 1 %
HCT: 32.6 % — ABNORMAL LOW (ref 39.0–52.0)
Hemoglobin: 10.2 g/dL — ABNORMAL LOW (ref 13.0–17.0)
Immature Granulocytes: 1 %
Lymphocytes Relative: 13 %
Lymphs Abs: 1.5 10*3/uL (ref 0.7–4.0)
MCH: 29.5 pg (ref 26.0–34.0)
MCHC: 31.3 g/dL (ref 30.0–36.0)
MCV: 94.2 fL (ref 80.0–100.0)
Monocytes Absolute: 1.1 10*3/uL — ABNORMAL HIGH (ref 0.1–1.0)
Monocytes Relative: 9 %
Neutro Abs: 8.4 10*3/uL — ABNORMAL HIGH (ref 1.7–7.7)
Neutrophils Relative %: 75 %
Platelets: 348 10*3/uL (ref 150–400)
RBC: 3.46 MIL/uL — ABNORMAL LOW (ref 4.22–5.81)
RDW: 13.2 % (ref 11.5–15.5)
WBC: 11.4 10*3/uL — ABNORMAL HIGH (ref 4.0–10.5)
nRBC: 0 % (ref 0.0–0.2)

## 2024-02-03 LAB — COMPREHENSIVE METABOLIC PANEL WITH GFR
ALT: 100 U/L — ABNORMAL HIGH (ref 0–44)
AST: 68 U/L — ABNORMAL HIGH (ref 15–41)
Albumin: 3 g/dL — ABNORMAL LOW (ref 3.5–5.0)
Alkaline Phosphatase: 231 U/L — ABNORMAL HIGH (ref 38–126)
Anion gap: 14 (ref 5–15)
BUN: 16 mg/dL (ref 6–20)
CO2: 22 mmol/L (ref 22–32)
Calcium: 8.6 mg/dL — ABNORMAL LOW (ref 8.9–10.3)
Chloride: 96 mmol/L — ABNORMAL LOW (ref 98–111)
Creatinine, Ser: 0.93 mg/dL (ref 0.61–1.24)
GFR, Estimated: >60 mL/min (ref >60)
Glucose, Bld: 97 mg/dL (ref 70–99)
Potassium: 4 mmol/L (ref 3.5–5.1)
Sodium: 132 mmol/L — ABNORMAL LOW (ref 135–145)
Total Bilirubin: 1.2 mg/dL (ref 0.0–1.2)
Total Protein: 7.4 g/dL (ref 6.5–8.1)

## 2024-02-03 LAB — ANTINUCLEAR ANTIBODIES, IFA: ANA Ab, IFA: NEGATIVE

## 2024-02-03 MED ORDER — METHOCARBAMOL 500 MG PO TABS
500.0000 mg | ORAL_TABLET | Freq: Four times a day (QID) | ORAL | Status: DC
  Administered 2024-02-03 – 2024-02-04 (×4): 500 mg via ORAL
  Filled 2024-02-03 (×4): qty 1

## 2024-02-03 MED ORDER — APIXABAN 5 MG PO TABS: 10.0000 mg | ORAL_TABLET | Freq: Two times a day (BID) | ORAL | Status: DC

## 2024-02-03 MED ORDER — APIXABAN 5 MG PO TABS: 5.0000 mg | ORAL_TABLET | Freq: Two times a day (BID) | ORAL | Status: DC

## 2024-02-03 MED ORDER — APIXABAN 5 MG PO TABS
10.0000 mg | ORAL_TABLET | Freq: Two times a day (BID) | ORAL | Status: DC
  Administered 2024-02-03 – 2024-02-04 (×3): 10 mg via ORAL
  Filled 2024-02-03 (×3): qty 2

## 2024-02-03 MED ORDER — HYDROMORPHONE HCL 1 MG/ML IJ SOLN
0.5000 mg | INTRAMUSCULAR | Status: AC | PRN
  Administered 2024-02-03 – 2024-02-04 (×5): 0.5 mg via INTRAVENOUS
  Filled 2024-02-03 (×5): qty 0.5

## 2024-02-03 NOTE — Progress Notes (Signed)
 PROGRESS NOTE    Japheth Diekman Hashem  ZOX:[id number] DOB: July 23, 1981 DOA: 01/30/2024 PCP: Perley Bradley, MD    Brief Narrative:  43 year old male with history of generalized anxiety disorder, alcohol abuse who was discharged 1 day ago after 13 days of hospitalization for alcohol withdrawal syndrome, metabolic encephalopathy, mechanical ventilation and extubation.  He had some chest discomfort on discharge however he was stable.  He came back to the hospital complaining of worsening chest pain with shortness of breath.  In the emergency room hemodynamically stable.  Patient was found to have acute PE right main pulmonary artery with right ventricular strain, infarction of the lung.  Also found to have lower extremity DVT.  Admitted on IV heparin .  Subjective: Patient seen and examined.  Still has significant right-sided chest pain worse with deep breathing.  On room air.  Temperature 101 overnight.  Worried about ongoing pain control and wants to use some Dilaudid .  Assessment & Plan:   Acute PE with cor pulmonale Acute right lower extremity DVT  Patient currently hemodynamically stable.  On room air. Echocardiogram without evidence of right ventricular strain. Treated with heparin  then with Lovenox .  With clinical stability, will start on Eliquis today.  Will need Eliquis ready from pharmacy to prepare for weekend discharge. Still has significant chest pain and intermittent fever.  Continue adequate oral and IV opiates for pain relief.  Start scheduled Robaxin 500 mg every 6 hours. Incentive spirometry, breathing exercises, mobility and adequate pain control.  Anticipate discharge on Eliquis when medically stable.  Fever: Suspect metabolic fever due to pulmonary infarction.  Physiotherapy. No evidence of active infection.  Hypothyroidism: On Synthroid .  Hypertension, on Coreg.  Alcohol abuse, anxiety disorder: Currently needing as needed medications.  He is total abstinence for last 2  weeks.  Looking forward to continue outpatient rehab.  Continue mobilize.  Pain control.  Anticipate home tomorrow.    DVT prophylaxis:  apixaban (ELIQUIS) tablet 10 mg  apixaban (ELIQUIS) tablet 5 mg   Code Status: Full code Family Communication: None at the bedside.   Disposition Plan: Status is: Inpatient Remains inpatient appropriate because: Still with significant pain.     Consultants:  None  Procedures:  None  Antimicrobials:  None     Objective: Vitals:   02/02/24 1940 02/03/24 0531 02/03/24 0824 02/03/24 0922  BP: (!) 150/81 (!) 145/87 134/80 121/66  Pulse: 92 91 (!) 107 96  Resp: 20  20 18   Temp: 99.4 F (37.4 C) 98.7 F (37.1 C) 100.1 F (37.8 C) 99.7 F (37.6 C)  TempSrc: Oral Oral Oral Oral  SpO2: 94% 98% 96% 94%  Weight:      Height:        Intake/Output Summary (Last 24 hours) at 02/03/2024 1154 Last data filed at 02/03/2024 1000 Gross per 24 hour  Intake 120 ml  Output --  Net 120 ml   Filed Weights   01/31/24 1500 02/02/24 0500  Weight: 120.9 kg 119.8 kg    Examination:  General exam: Appears calm and comfortable.  Complains of pain. Respiratory system: Clear to auscultation. Respiratory effort reduced.  No added sounds. Cardiovascular system: S1 & S2 heard, RRR.  No edema.  Moderate reproducible pain on the right lower costal margins. Gastrointestinal system: Abdomen is nondistended, soft and nontender. No organomegaly or masses felt. Normal bowel sounds heard. Central nervous system: Alert and oriented. No focal neurological deficits. Extremities: Symmetric 5 x 5 power.  No edema or swelling.     Data  Reviewed: I have personally reviewed following labs and imaging studies  CBC: Recent Labs  Lab 01/29/24 1042 01/30/24 0921 01/30/24 2344 02/01/24 0105 02/03/24 0805  WBC 11.3* 13.2* 13.3* 12.7* 11.4*  NEUTROABS  --   --  9.4*  --  8.4*  HGB 11.7* 11.9* 12.7* 11.2* 10.2*  HCT 35.9* 35.2* 37.5* 35.1* 32.6*  MCV 91.8 89.3  88.2 93.9 94.2  PLT 248 255 307 279 348   Basic Metabolic Panel: Recent Labs  Lab 01/28/24 0724 01/29/24 1042 01/30/24 2344 01/31/24 0416 01/31/24 1610 02/01/24 0105 02/02/24 0507 02/03/24 0805  NA 134*   < > 129* 128*  --  131* 129* 132*  K 4.0   < > 3.8 3.7  --  4.4 4.2 4.0  CL 103   < > 95* 94*  --  97* 96* 96*  CO2 23   < > 20* 21*  --  22 23 22   GLUCOSE 114*   < > 112* 111*  --  94 111* 97  BUN 16   < > 22* 22*  --  14 6 16   CREATININE 0.97   < > 0.96 1.32*  --  0.89 <0.30* 0.93  CALCIUM  8.3*   < > 9.2 9.2  --  8.6* 8.7* 8.6*  MG 2.5*  --   --   --  2.3  --  2.6*  --   PHOS 4.5  --   --   --  4.4  --  3.0  --    < > = values in this interval not displayed.   GFR: Estimated Creatinine Clearance: 139.7 mL/min (by C-G formula based on SCr of 0.93 mg/dL). Liver Function Tests: Recent Labs  Lab 01/31/24 0416 02/01/24 0105 02/02/24 0507 02/03/24 0805  AST 126* 75* 46* 68*  ALT 239* 172* 105* 100*  ALKPHOS 284* 247* 198* 231*  BILITOT 0.9 1.2 1.3* 1.2  PROT 8.1 7.1 6.6 7.4  ALBUMIN 4.0 3.3* 2.8* 3.0*   No results for input(s): "LIPASE", "AMYLASE" in the last 168 hours. Recent Labs  Lab 01/29/24 1042  AMMONIA 38*   Coagulation Profile: Recent Labs  Lab 01/31/24 0416  INR 1.2   Cardiac Enzymes: Recent Labs  Lab 01/31/24 1610  CKTOTAL 112   BNP (last 3 results) No results for input(s): "PROBNP" in the last 8760 hours. HbA1C: No results for input(s): "HGBA1C" in the last 72 hours. CBG: Recent Labs  Lab 01/29/24 2001 01/30/24 0029 01/30/24 0412 01/30/24 0816 01/30/24 1150  GLUCAP 98 101* 101* 88 94   Lipid Profile: No results for input(s): "CHOL", "HDL", "LDLCALC", "TRIG", "CHOLHDL", "LDLDIRECT" in the last 72 hours. Thyroid Function Tests: No results for input(s): "TSH", "T4TOTAL", "FREET4", "T3FREE", "THYROIDAB" in the last 72 hours. Anemia Panel: No results for input(s): "VITAMINB12", "FOLATE", "FERRITIN", "TIBC", "IRON", "RETICCTPCT" in the  last 72 hours. Sepsis Labs: Recent Labs  Lab 01/31/24 0406  LATICACIDVEN 0.8    Recent Results (from the past 240 hours)  Blood Culture (routine x 2)     Status: None (Preliminary result)   Collection Time: 01/31/24  3:46 AM   Specimen: BLOOD  Result Value Ref Range Status   Specimen Description   Final    BLOOD BLOOD RIGHT FOREARM Performed at [facility], [address] [place], [place] [address]    Special Requests   Final    BOTTLES DRAWN AEROBIC AND ANAEROBIC Blood Culture adequate volume Performed at [facility], [address]  [place], [place] [address]    Culture   Final    NO GROWTH 3 DAYS Performed at [facility], [address] [place], [place] [address]    Report Status PENDING  Incomplete  Resp panel by RT-PCR (RSV, Flu A&B, Covid)     Status: None   Collection Time: 01/31/24  4:15 AM   Specimen: Nasal Swab  Result Value Ref Range Status   SARS Coronavirus 2 by RT PCR NEGATIVE NEGATIVE Final    Comment: (NOTE) SARS-CoV-2 target nucleic acids are NOT DETECTED.  The SARS-CoV-2 RNA is generally detectable in upper respiratory specimens during the acute phase of infection. The lowest concentration of SARS-CoV-2 viral copies this assay can detect is 138 copies/mL. A negative result does not preclude SARS-Cov-2 infection and should not be used as the sole basis for treatment or other patient management decisions. A negative result may occur with  improper specimen collection/handling, submission of specimen other than nasopharyngeal swab, presence of viral mutation(s) within the areas targeted by this assay, and inadequate number of viral copies(<138 copies/mL). A negative result must be combined with clinical observations, patient history, and epidemiological information. The expected result is Negative.  Fact Sheet for Patients:  BloggerCourse.com  Fact Sheet for Healthcare Providers:   SeriousBroker.it  This test is no t yet approved or cleared by the [place]  FDA and  has been authorized for detection and/or diagnosis of SARS-CoV-2 by FDA under an Emergency Use Authorization (EUA). This EUA will remain  in effect (meaning this test can be used) for the duration of the COVID-19 declaration under Section 564(b)(1) of the Act, 21 U.S.C.section 360bbb-3(b)(1), unless the authorization is terminated  or revoked sooner.       Influenza A by PCR NEGATIVE NEGATIVE Final   Influenza B by PCR NEGATIVE NEGATIVE Final    Comment: (NOTE) The Xpert Xpress SARS-CoV-2/FLU/RSV plus assay is intended as an aid in the diagnosis of influenza from Nasopharyngeal swab specimens and should not be used as a sole basis for treatment. Nasal washings and aspirates are unacceptable for Xpert Xpress SARS-CoV-2/FLU/RSV testing.  Fact Sheet for Patients: BloggerCourse.com  Fact Sheet for Healthcare Providers: SeriousBroker.it  This test is not yet approved or cleared by the [place]  FDA and has been authorized for detection and/or diagnosis of SARS-CoV-2 by FDA under an Emergency Use Authorization (EUA). This EUA will remain in effect (meaning this test can be used) for the duration of the COVID-19 declaration under Section 564(b)(1) of the Act, 21 U.S.C. section 360bbb-3(b)(1), unless the authorization is terminated or revoked.     Resp Syncytial Virus by PCR NEGATIVE NEGATIVE Final    Comment: (NOTE) Fact Sheet for Patients: BloggerCourse.com  Fact Sheet for Healthcare Providers: SeriousBroker.it  This test is not yet approved or cleared by the [place]  FDA and has been authorized for detection and/or diagnosis of SARS-CoV-2 by FDA under an Emergency Use Authorization (EUA). This EUA will remain in effect (meaning this test can be used) for  the duration of the COVID-19 declaration under Section 564(b)(1) of the Act, 21 U.S.C. section 360bbb-3(b)(1), unless the authorization is terminated or revoked.  Performed at [facility], [address], [place], [place] [address]   Blood Culture (routine x 2)     Status: None (Preliminary result)   Collection Time: 01/31/24  4:10 PM   Specimen: BLOOD RIGHT HAND  Result Value Ref Range Status   Specimen Description   Final    BLOOD  RIGHT HAND Performed at [facility], [address], [place], [place] [address]    Special Requests   Final    BOTTLES DRAWN AEROBIC AND ANAEROBIC Blood Culture adequate volume Performed at [facility], [address], [place], [place] [address]    Culture   Final    NO GROWTH 3 DAYS Performed at [facility], [address], [place], [place] [address]    Report Status PENDING  Incomplete         Radiology Studies: No results found.       Scheduled Meds:  allopurinol   300 mg Oral Daily   apixaban  10 mg Oral BID   Followed by   Cecily Cohen ON 02/10/2024] apixaban  5 mg Oral BID   carvedilol  6.25 mg Oral BID WC   Chlorhexidine  Gluconate Cloth  6 each Topical Daily   levothyroxine   75 mcg Oral QAC breakfast   methocarbamol  500 mg Oral QID   Continuous Infusions:     LOS: 3 days    Time spent: 40 minutes    Vada Garibaldi, MD [facility]

## 2024-02-03 NOTE — TOC Progression Note (Signed)
 Transition of Care [facility]) - Progression Note    Patient Details  Name: Juan Clarke MRN: [id number] Date of Birth: 04-Sep-1980  Transition of Care [facility] CM/SW Contact  Levie Ream, RN Phone Number: 02/03/2024, 11:32 AM  Clinical Narrative:    Received call from Grady Lawman, [employer] D/C Planning; she says she is available to assist w/ d/c planning needs; her contact # is [phone].   Expected Discharge Plan: Home/Self Care Barriers to Discharge: Continued Medical Work up  Expected Discharge Plan and Services In-house Referral: NA Discharge Planning Services: CM Consult Post Acute Care Choice: NA (NA) Living arrangements for the past 2 months: Single Family Home                 DME Arranged: N/A DME Agency: NA       HH Arranged: NA HH Agency: NA         Social Determinants of Health (SDOH) Interventions SDOH Screenings   Food Insecurity: No Food Insecurity (01/31/2024)  Housing: Low Risk  (01/31/2024)  Transportation Needs: No Transportation Needs (01/31/2024)  Utilities: Not At Risk (01/31/2024)  Social Connections: Unknown (01/11/2022)   [facility]  Tobacco Use: Low Risk  (01/30/2024)    Readmission Risk Interventions    02/01/2024    1:16 PM 01/20/2024   12:44 PM 01/20/2024    9:59 AM  Readmission Risk Prevention Plan  Transportation Screening Complete Complete Complete  PCP or Specialist Appt within 3-5 Days  Complete Complete  HRI or Home Care Consult  Complete   Social Work Consult for Recovery Care Planning/Counseling  Complete   Palliative Care Screening   Complete  Medication Review Oceanographer) Complete Complete   PCP or Specialist appointment within 3-5 days of discharge Complete    HRI or Home Care Consult Complete    SW Recovery Care/Counseling Consult Complete    Palliative Care Screening Not Applicable    Skilled Nursing Facility Complete

## 2024-02-03 NOTE — Progress Notes (Signed)
 PHARMACY - ANTICOAGULATION CONSULT NOTE  Pharmacy Consult for Eliquis Indication: DVT and PE  Allergies  Allergen Reactions   Lexapro [Escitalopram Oxalate] Other (See Comments)    Lack of therapeutic effect    Zoloft [Sertraline Hcl] Other (See Comments)    Lack of therapeutic effect     Patient Measurements: Height: 6' (182.9 cm) Weight: 119.8 kg (264 lb 1.8 oz) IBW/kg (Calculated) : 77.6 HEPARIN  DW (KG): 104.2  Vital Signs: Temp: 98.7 F (37.1 C) (06/06 0531) Temp Source: Oral (06/06 0531) BP: 145/87 (06/06 0531) Pulse Rate: 91 (06/06 0531)  Labs: Recent Labs    01/31/24 1610 02/01/24 0105 02/01/24 0829 02/01/24 1550 02/02/24 0507  HGB  --  11.2*  --   --   --   HCT  --  35.1*  --   --   --   PLT  --  279  --   --   --   HEPARINUNFRC <0.10* 0.30 <0.10* <0.10*  --   CREATININE  --  0.89  --   --  <0.30*  CKTOTAL 112  --   --   --   --     CrCl cannot be calculated (This lab value cannot be used to calculate CrCl because it is not a number: <0.30).   Medical History: Past Medical History:  Diagnosis Date   GAD (generalized anxiety disorder)    HTN (hypertension)    Insomnia    Migraine without aura    Overweight    Trigeminal neuralgia     Assessment:  AC/Heme: UFH for PE/RHS, bilateral DVT.  - LMWH 120 q12 d/t IV access issues - 6/6: Transition to Eliquis  Goal of Therapy:  Therapeutic oral anticoagulation  Plan:  D/c Lovenox  - 6/6: Start Eliquis 10mg  BID x 7d, then 5mg  BID   >Eliquis $30 copay D/c Carbamazepine    Dakiyah Heinke Darcel Early, PharmD, BCPS Clinical Staff Pharmacist Enis Harsh Stillinger 02/03/2024,7:43 AM

## 2024-02-03 NOTE — Plan of Care (Signed)

## 2024-02-04 ENCOUNTER — Other Ambulatory Visit: Payer: Self-pay | Admitting: Internal Medicine

## 2024-02-04 ENCOUNTER — Other Ambulatory Visit (HOSPITAL_COMMUNITY): Payer: Self-pay

## 2024-02-04 MED ORDER — METHOCARBAMOL 500 MG PO TABS
500.0000 mg | ORAL_TABLET | Freq: Four times a day (QID) | ORAL | 0 refills | Status: AC
  Filled 2024-02-04: qty 28, 7d supply, fill #0

## 2024-02-04 MED ORDER — COLCHICINE 0.6 MG PO TABS
0.6000 mg | ORAL_TABLET | Freq: Once | ORAL | Status: AC
  Administered 2024-02-04: 0.6 mg via ORAL
  Filled 2024-02-04: qty 1

## 2024-02-04 MED ORDER — OXYCODONE HCL 5 MG PO TABS
5.0000 mg | ORAL_TABLET | Freq: Four times a day (QID) | ORAL | 0 refills | Status: DC | PRN
  Filled 2024-02-04: qty 20, 5d supply, fill #0

## 2024-02-04 MED ORDER — APIXABAN (ELIQUIS) VTE STARTER PACK (10MG AND 5MG)
ORAL_TABLET | ORAL | 0 refills | Status: DC
  Filled 2024-02-04: qty 74, 30d supply, fill #0

## 2024-02-04 NOTE — Progress Notes (Signed)
 AVS reviewed w/ pt who verbalized an understanding. TOC meds in a secure bag delivered to pt in room bt this RN- eliquis  starter pak was delivered 2 days prior. PIV removed as noted- pt dressing for d/c to home

## 2024-02-04 NOTE — Plan of Care (Signed)

## 2024-02-04 NOTE — Discharge Summary (Signed)
 Physician Discharge Summary  Juan Clarke MWN:[id number] DOB: 1981/07/14 DOA: 01/30/2024  PCP: Perley Bradley, MD  Admit date: 01/30/2024 Discharge date: 02/04/2024  Admitted From: Home Disposition: Home  Recommendations for Outpatient Follow-up:  Follow up with PCP in 1-2 weeks Please obtain BMP/CBC in one week Follow-up at alcohol rehab  Home Health: N/A Equipment/Devices: N/A  Discharge Condition: Stable CODE STATUS: Full code Diet recommendation: Regular diet  Discharge summary: 43 year old male with history of generalized anxiety disorder, alcohol abuse who was discharged 1 day ago after 13 days of hospitalization for alcohol withdrawal syndrome, metabolic encephalopathy, mechanical ventilation and extubation.  Came back with chest discomfort and found to have pulmonary embolism, bilateral DVT.  Initially treated with heparin  then with Lovenox  and now remains on Eliquis .  Patient did good clinical recovery.  He had some evidence of ongoing fever and pleuritic chest pain that has improved now.    Acute PE with cor pulmonale/ Acute right lower extremity DVT: Echocardiogram without right ventricular strain.  Provoked PE from recent ICU admission. Currently hemodynamically stable.  On room air.  Tolerating Eliquis  last 24 hours.  Pleural pain controlled with oral pain medication and Robaxin . Discharging home with Eliquis , recommended 6 months of anticoagulation given significant clot burden. Follow-up with PCP in 2 to 3 weeks.   Hypothyroidism: On Synthroid .   Hypertension, on propranolol .  Continue.   Alcohol abuse, anxiety disorder: Patient out of withdrawal window.  He did very well.  He has very good potential to rehab and recovery from alcoholism.  He can continue Cymbalta  now.  Patient is stable to go to drug alcohol rehab after discharge from the hospital.  Adequately stabilized to discharge home.   Discharge Diagnoses:  Principal Problem:   Pulmonary embolism  (HCC) Active Problems:   Generalized anxiety disorder   ETOH abuse   Prolonged QT interval   Acquired hypothyroidism   Essential hypertension   Mixed hyperlipidemia   Acute bilateral deep vein thrombosis (DVT) of popliteal veins (HCC)   Class 2 obesity    Discharge Instructions  Discharge Instructions     Call MD for:  difficulty breathing, headache or visual disturbances   Complete by: As directed    Call MD for:  severe uncontrolled pain   Complete by: As directed    Diet general   Complete by: As directed    Increase activity slowly   Complete by: As directed       Allergies as of 02/04/2024       Reactions   Lexapro [escitalopram Oxalate] Other (See Comments)   Lack of therapeutic effect    Zoloft [sertraline Hcl] Other (See Comments)   Lack of therapeutic effect         Medication List     STOP taking these medications    amoxicillin -clavulanate 875-125 MG tablet Commonly known as: AUGMENTIN    atorvastatin  20 MG tablet Commonly known as: LIPITOR   carbamazepine  200 MG tablet Commonly known as: TEGRETOL    Constulose  10 GM/15ML solution Generic drug: lactulose    Dextromethorphan  HBr 15 MG Caps   lisinopril -hydrochlorothiazide  20-25 MG tablet Commonly known as: ZESTORETIC    LORazepam  1 MG tablet Commonly known as: ATIVAN    thiamine  100 MG tablet Commonly known as: VITAMIN B1       TAKE these medications    acetaminophen  325 MG tablet Commonly known as: TYLENOL  Take 325-650 mg by mouth every 6 (six) hours as needed for mild pain (pain score 1-3) or headache.   allopurinol  300  MG tablet Commonly known as: ZYLOPRIM  Take 300 mg by mouth daily.   diphenhydrAMINE  25 mg capsule Commonly known as: BENADRYL  Take 25 mg by mouth every 6 (six) hours as needed for itching or allergies.   DULoxetine  60 MG capsule Commonly known as: CYMBALTA  Take 120 mg by mouth daily. What changed: Another medication with the same name was removed. Continue  taking this medication, and follow the directions you see here.   Eliquis  DVT/PE Starter Pack Generic drug: Apixaban  Starter Pack (10mg  and 5mg ) Take as directed on package: start with two-5mg  tablets twice daily for 7 days. On day 8, switch to one-5mg  tablet twice daily.   levothyroxine  75 MCG tablet Commonly known as: SYNTHROID  Take 75 mcg by mouth daily before breakfast.   methocarbamol  500 MG tablet Commonly known as: ROBAXIN  Take 1 tablet (500 mg total) by mouth 4 (four) times daily for 7 days.   oxyCODONE  5 MG immediate release tablet Commonly known as: Roxicodone  Take 1 tablet (5 mg total) by mouth every 6 (six) hours as needed.   Pepcid  Complete 10-800-165 MG chewable tablet Generic drug: famotidine -calcium  carbonate-magnesium  hydroxide Chew 1 tablet by mouth daily as needed (for heartburn or reflux).   propranolol  20 MG tablet Commonly known as: INDERAL  Take 20 mg by mouth 3 (three) times daily as needed (Anxiety).   traZODone  50 MG tablet Commonly known as: DESYREL  Take 50 mg by mouth at bedtime as needed for sleep.        Allergies  Allergen Reactions   Lexapro [Escitalopram Oxalate] Other (See Comments)    Lack of therapeutic effect    Zoloft [Sertraline Hcl] Other (See Comments)    Lack of therapeutic effect     Consultations: None   Procedures/Studies: ECHOCARDIOGRAM COMPLETE Result Date: 02/01/2024    ECHOCARDIOGRAM REPORT   Patient Name:   Juan Clarke Date of Exam: 02/01/2024 Medical Rec #:  [id number]        Height:       72.0 in Accession #:    [id number]       Weight:       266.5 lb Date of Birth:  [birth year]        BSA:          2.407 m Patient Age:    43 years         BP:           154/52 mmHg Patient Gender: M                HR:           109 bpm. Exam Location:  Inpatient Procedure: 2D Echo, Color Doppler and Cardiac Doppler (Both Spectral and Color            Flow Doppler were utilized during procedure). Indications:    I26.02 Pulmonary embolus   History:        Patient has no prior history of Echocardiogram examinations.                 Risk Factors:Hypertension.  Sonographer:    Andrena Bang Referring Phys: [id number] DAVID MANUEL ORTIZ  Sonographer Comments: Pt short of breath during exam. IMPRESSIONS  1. Left ventricular ejection fraction, by estimation, is 70 to 75%. The left ventricle has hyperdynamic function. The left ventricle has no regional wall motion abnormalities. There is moderate concentric left ventricular hypertrophy. Left ventricular diastolic parameters are indeterminate.  2. Right ventricular systolic function is normal. The right ventricular size is  normal. Tricuspid regurgitation signal is inadequate for assessing PA pressure.  3. The mitral valve is normal in structure. Trivial mitral valve regurgitation. No evidence of mitral stenosis.  4. The aortic valve is normal in structure. Aortic valve regurgitation is not visualized. No aortic stenosis is present.  5. The inferior vena cava is dilated in size with >50% respiratory variability, suggesting right atrial pressure of 8 mmHg. FINDINGS  Left Ventricle: Left ventricular ejection fraction, by estimation, is 70 to 75%. The left ventricle has hyperdynamic function. The left ventricle has no regional wall motion abnormalities. Definity  contrast agent was given IV to delineate the left ventricular endocardial borders. The left ventricular internal cavity size was normal in size. There is moderate concentric left ventricular hypertrophy. Left ventricular diastolic parameters are indeterminate. Right Ventricle: The right ventricular size is normal. No increase in right ventricular wall thickness. Right ventricular systolic function is normal. Tricuspid regurgitation signal is inadequate for assessing PA pressure. Left Atrium: Left atrial size was normal in size. Right Atrium: Right atrial size was normal in size. Pericardium: There is no evidence of pericardial effusion. Mitral Valve: The  mitral valve is normal in structure. Trivial mitral valve regurgitation. No evidence of mitral valve stenosis. Tricuspid Valve: The tricuspid valve is normal in structure. Tricuspid valve regurgitation is trivial. No evidence of tricuspid stenosis. Aortic Valve: The aortic valve is normal in structure. Aortic valve regurgitation is not visualized. No aortic stenosis is present. Aortic valve mean gradient measures 5.0 mmHg. Aortic valve peak gradient measures 9.2 mmHg. Aortic valve area, by VTI measures 2.84 cm. Pulmonic Valve: The pulmonic valve was normal in structure. Pulmonic valve regurgitation is not visualized. No evidence of pulmonic stenosis. Aorta: The aortic root is normal in size and structure. Venous: The inferior vena cava is dilated in size with greater than 50% respiratory variability, suggesting right atrial pressure of 8 mmHg. IAS/Shunts: No atrial level shunt detected by color flow Doppler.  LEFT VENTRICLE PLAX 2D LVIDd:         4.00 cm      Diastology LVIDs:         2.30 cm      LV e' medial:    6.84 cm/s LV PW:         1.70 cm      LV E/e' medial:  11.2 LV IVS:        1.50 cm      LV e' lateral:   15.70 cm/s LVOT diam:     2.00 cm      LV E/e' lateral: 4.9 LV SV:         67 LV SV Index:   28 LVOT Area:     3.14 cm  LV Volumes (MOD) LV vol d, MOD A2C: 119.0 ml LV vol d, MOD A4C: 104.0 ml LV vol s, MOD A2C: 33.3 ml LV vol s, MOD A4C: 33.5 ml LV SV MOD A2C:     85.7 ml LV SV MOD A4C:     104.0 ml LV SV MOD BP:      79.8 ml RIGHT VENTRICLE RV S prime:     20.50 cm/s TAPSE (M-mode): 2.0 cm LEFT ATRIUM             Index LA Vol (A2C):   17.7 ml 7.35 ml/m LA Vol (A4C):   46.9 ml 19.48 ml/m LA Biplane Vol: 29.4 ml 12.21 ml/m  AORTIC VALVE AV Area (Vmax):    2.50 cm AV Area (Vmean):  2.38 cm AV Area (VTI):     2.84 cm AV Vmax:           152.00 cm/s AV Vmean:          103.000 cm/s AV VTI:            0.237 m AV Peak Grad:      9.2 mmHg AV Mean Grad:      5.0 mmHg LVOT Vmax:         121.00 cm/s LVOT  Vmean:        78.000 cm/s LVOT VTI:          0.214 m LVOT/AV VTI ratio: 0.90  AORTA Ao Asc diam: 3.20 cm MITRAL VALVE MV Area (PHT): 4.71 cm    SHUNTS MV Decel Time: 161 msec    Systemic VTI:  0.21 m MV E velocity: 76.30 cm/s  Systemic Diam: 2.00 cm MV A velocity: 67.30 cm/s MV E/A ratio:  1.13 Jules Oar MD Electronically signed by Jules Oar MD Signature Date/Time: 02/01/2024/9:12:39 AM    Final    VAS US  LOWER EXTREMITY VENOUS (DVT) Result Date: 01/31/2024  Lower Venous DVT Study Patient Name:  Juan Clarke  Date of Exam:   01/31/2024 Medical Rec #: [id number]         Accession #:    [id number] Date of Birth: 05-Oct-1980         Patient Gender: M Patient Age:   41 years Exam Location[facility]  [facility] Procedure:      VAS US  LOWER EXTREMITY VENOUS (DVT) Referring Phys: DAVID ORTIZ --------------------------------------------------------------------------------  Indications: Pulmonary embolism.  Risk Factors: Confirmed PE. Anticoagulation: Heparin . Comparison Study: No prior studies. Performing Technologist: Lerry Ransom RVT  Examination Guidelines: A complete evaluation includes B-mode imaging, spectral Doppler, color Doppler, and power Doppler as needed of all accessible portions of each vessel. Bilateral testing is considered an integral part of a complete examination. Limited examinations for reoccurring indications may be performed as noted. The reflux portion of the exam is performed with the patient in reverse Trendelenburg.  +---------+---------------+---------+-----------+----------+--------------+ RIGHT    CompressibilityPhasicitySpontaneityPropertiesThrombus Aging +---------+---------------+---------+-----------+----------+--------------+ CFV      Full           Yes      Yes                                 +---------+---------------+---------+-----------+----------+--------------+ SFJ      Full                                                         +---------+---------------+---------+-----------+----------+--------------+ FV Prox  Full                                                        +---------+---------------+---------+-----------+----------+--------------+ FV Mid   Full                                                        +---------+---------------+---------+-----------+----------+--------------+  FV DistalFull                                                        +---------+---------------+---------+-----------+----------+--------------+ PFV      Full                                                        +---------+---------------+---------+-----------+----------+--------------+ POP      None           No       No                   Acute          +---------+---------------+---------+-----------+----------+--------------+ PTV      Full                                                        +---------+---------------+---------+-----------+----------+--------------+ PERO     None                                         Acute          +---------+---------------+---------+-----------+----------+--------------+ Gastroc  Full                                                        +---------+---------------+---------+-----------+----------+--------------+   +---------+---------------+---------+-----------+----------+--------------+ LEFT     CompressibilityPhasicitySpontaneityPropertiesThrombus Aging +---------+---------------+---------+-----------+----------+--------------+ CFV      Full           Yes      Yes                                 +---------+---------------+---------+-----------+----------+--------------+ SFJ      Full                                                        +---------+---------------+---------+-----------+----------+--------------+ FV Prox  Full                                                         +---------+---------------+---------+-----------+----------+--------------+ FV Mid   Full                                                        +---------+---------------+---------+-----------+----------+--------------+  FV DistalFull                                                        +---------+---------------+---------+-----------+----------+--------------+ PFV      Full                                                        +---------+---------------+---------+-----------+----------+--------------+ POP      Full           Yes      Yes                                 +---------+---------------+---------+-----------+----------+--------------+ PTV      Full                                                        +---------+---------------+---------+-----------+----------+--------------+ PERO     Partial                                      Acute          +---------+---------------+---------+-----------+----------+--------------+     Summary: RIGHT: - Findings consistent with acute deep vein thrombosis involving the right popliteal vein, and right peroneal veins.  - No cystic structure found in the popliteal fossa.  LEFT: - Findings consistent with acute deep vein thrombosis involving the left peroneal veins.  - No cystic structure found in the popliteal fossa.  *See table(s) above for measurements and observations. Electronically signed by Angela Kell MD on 01/31/2024 at 5:14:49 PM.    Final    CT Angio Chest PE W and/or Wo Contrast Result Date: 01/31/2024 CLINICAL DATA:  Chest pain. Recent hospital admission. Pain is increased with inspiration. EXAM: CT ANGIOGRAPHY CHEST WITH CONTRAST TECHNIQUE: Multidetector CT imaging of the chest was performed using the standard protocol during bolus administration of intravenous contrast. Multiplanar CT image reconstructions and MIPs were obtained to evaluate the vascular anatomy. RADIATION DOSE REDUCTION: This exam was performed  according to the departmental dose-optimization program which includes automated exposure control, adjustment of the mA and/or kV according to patient size and/or use of iterative reconstruction technique. CONTRAST:  75mL OMNIPAQUE  IOHEXOL  350 MG/ML SOLN COMPARISON:  None Available. FINDINGS: Cardiovascular: There is satisfactory opacification of the pulmonary arteries. Thrombus is identified at the bifurcation of the distal right main pulmonary artery, image 65/6. Thrombus extends into the right lower lobe are, segmental and subsegmental pulmonary arteries. Small filling defect is also identified within a segmental branch of the left upper lobe pulmonary artery. The RV to LV ratio is equal to 40.39 mm/38.34 mm = 1.04. This is compatible with mild right heart strain. Coronary artery calcifications. No significant aortic atherosclerotic calcifications. Mediastinum/Nodes: Thyroid gland and trachea are unremarkable. Circumferential wall thickening involving the distal esophagus noted, image 94/6. No enlarged mediastinal or hilar lymph nodes. Lungs/Pleura: Small bilateral pleural  effusions. There is peripheral ground-glass attenuation and mild consolidative change within the right lower lobe compatible with infarct. Areas of subsegmental atelectasis noted in the left base. Upper Abdomen: No acute abnormality. Musculoskeletal: No acute or suspicious osseous findings. Review of the MIP images confirms the above findings. IMPRESSION: 1. Examination is positive for acute pulmonary embolism with thrombus identified at the bifurcation of the distal right main pulmonary artery. Thrombus extends into the right lower lobar, segmental and subsegmental pulmonary arteries. Small filling defect is also identified within a segmental branch of the left upper lobe pulmonary artery. 2. RV to LV ratio is equal to 40.39 mm/38.34 mm = 1.04. This is compatible with mild right heart strain. 3. Small bilateral pleural effusions. 4. Peripheral  ground-glass attenuation and mild consolidative change within the right lower lobe compatible with infarct. 5. Circumferential wall thickening involving the distal esophagus. Correlate for any clinical signs or symptoms of esophagitis. 6. Coronary artery calcifications. 7.  Aortic Atherosclerosis (ICD10-I70.0). Critical Value/emergent results were called by telephone at the time of interpretation on 01/31/2024 at 6:41 am to provider Dr. April Palumbo, who verbally acknowledged these results. Electronically Signed   By: Kimberley Penman M.D.   On: 01/31/2024 06:42   DG Chest 2 View Result Date: 01/30/2024 CLINICAL DATA:  Chest pain.  Right-sided chest pain for 24 hours. EXAM: CHEST - 2 VIEW COMPARISON:  01/30/2024 FINDINGS: Shallow inspiration. Cardiac enlargement with mild pulmonary vascular congestion. Small bilateral pleural effusions. No airspace disease or consolidation. No pneumothorax. Mediastinal contours appear intact. IMPRESSION: Cardiac enlargement with pulmonary vascular congestion and small bilateral pleural effusions. No edema or consolidation is identified. Electronically Signed   By: Boyce Byes M.D.   On: 01/30/2024 23:58   DG Chest 2 View Result Date: 01/30/2024 CLINICAL DATA:  Fever EXAM: CHEST - 2 VIEW COMPARISON:  Chest radiograph dated 01/26/2024 FINDINGS: Lines/tubes: Interval extubation and removal of enteric tube. Lungs: Improved lung volumes. Minimal residual patchy left basilar opacities. Pleura: Decreased trace bilateral pleural effusions. No pneumothorax. Heart/mediastinum: The heart size and mediastinal contours are within normal limits. Bones: No acute osseous abnormality. IMPRESSION: 1. Improved lung volumes with minimal residual patchy left basilar opacities, likely atelectasis. 2. Decreased trace bilateral pleural effusions. Electronically Signed   By: Limin  Xu M.D.   On: 01/30/2024 08:41   DG Chest Port 1 View Result Date: 01/26/2024 CLINICAL DATA:  History of endotracheal  tube placement EXAM: PORTABLE CHEST 1 VIEW COMPARISON:  Chest x-ray performed Jan 25, 2024 FINDINGS: Endotracheal tube terminates approximately 3 cm above the carina. An enteric tube is present which courses below the diaphragm. A left-sided approach central venous catheter is present with tip terminating near the anticipated location of the SVC. Low lung volumes. Interstitial airspace opacities, similar. Improvement in basilar aeration on the right. No pneumothorax. IMPRESSION: 1. Similar appearance of life-support devices. 2. Central pulmonary vascular congestion. 3. Improvement in right basilar aeration. Electronically Signed   By: Reagan Camera M.D.   On: 01/26/2024 09:01   Portable Chest xray Result Date: 01/25/2024 CLINICAL DATA:  [id number].  Respiratory failure. EXAM: PORTABLE CHEST 1 VIEW COMPARISON:  Portable chest yesterday at 5:46 p.m. FINDINGS: 4:47 a.m. Left IJ central line terminates in the SVC about the azygous confluence. ETT tip is 3.8 cm from carina. NGT has been removed, feeding tube left in place, extending well into the stomach although the radiopaque tip is not filmed. Exam technically limited by body habitus. There is cardiomegaly with stable mediastinal configuration, perihilar vascular  congestion and increased central interstitial edema. There are moderate increasing pleural effusions with worsening atelectasis or consolidation in both lower lung fields. There is increasing left upper lobe perihilar opacity which could be alveolar edema or pneumonia. No other focal upper zonal abnormality is seen. No new osseous findings. No further change. IMPRESSION: 1. Increasing moderate pleural effusions with worsening atelectasis or consolidation in both lower lung fields. 2. Increasing left upper lobe perihilar opacity which could be alveolar edema or pneumonia. 3. Cardiomegaly with perihilar vascular congestion and increased central interstitial edema. 4. Support apparatus as above. Electronically  Signed   By: Denman Fischer M.D.   On: 01/25/2024 05:47   DG Abd 1 View Result Date: 01/24/2024 CLINICAL DATA:  NG tube placement EXAM: ABDOMEN - 1 VIEW COMPARISON:  Abdominal radiograph 01/22/2024 FINDINGS: The feeding tube has been retracted slightly with tip now at the distal descending duodenum. IMPRESSION: Feeding tube tip at the distal descending duodenum. Electronically Signed   By: Rozell Cornet M.D.   On: 01/24/2024 20:51   Portable Chest x-ray Result Date: 01/24/2024 CLINICAL DATA:  Intubation and orogastric tube placement EXAM: PORTABLE CHEST 1 VIEW COMPARISON:  01/22/2024 FINDINGS: Mildly degraded exam due to AP portable technique and patient body habitus. Endotracheal tube terminates 3.2 cm above carina. Nasogastric tube terminates in the proximal stomach. The side ports difficult to visualize but likely above the gastroesophageal junction. A feeding tube extends beyond the  inferior aspect of the film. Left internal jugular line tip at high SVC. Numerous leads and wires project over the chest. Cardiomegaly accentuated by AP portable technique. No pleural effusion or pneumothorax. Suspect asymmetric left-sided mild pulmonary venous congestion. Lateral right lower lobe subpleural pulmonary opacity. Low lung volumes. IMPRESSION: Appropriate position of endotracheal tube. Nasogastric tube terminating in the proximal stomach. This should be advanced for optimal positioning. Cardiomegaly and suspicion of asymmetric pulmonary venous congestion. Right lower lobe subpleural opacity is most likely atelectasis, new since the prior. These results will be called to the ordering clinician or representative by the Radiologist Assistant, and communication documented in the PACS or [employer]. Electronically Signed   By: Lore Rode M.D.   On: 01/24/2024 18:38   VAS US  UPPER EXTREMITY VENOUS DUPLEX Result Date: 01/24/2024 UPPER VENOUS STUDY  Patient Name:  Juan Clarke  Date of Exam:   01/23/2024  Medical Rec #: [id number]         Accession #:    [id number] Date of Birth: March 08, 1981         Patient Gender: M Patient Age:   89 years Exam Location:  [facility] Procedure:      VAS US  UPPER EXTREMITY VENOUS DUPLEX Referring Phys: RAKESH ALVA --------------------------------------------------------------------------------  Indications: Edema Limitations: Constant patient movement, patient in restraints. Comparison Study: No previous exams Performing Technologist: Jody Hill RVT, RDMS  Examination Guidelines: A complete evaluation includes B-mode imaging, spectral Doppler, color Doppler, and power Doppler as needed of all accessible portions of each vessel. Bilateral testing is considered an integral part of a complete examination. Limited examinations for reoccurring indications may be performed as noted.  Right Findings: +----------+------------+---------+-----------+----------+--------------+ RIGHT     CompressiblePhasicitySpontaneousProperties   Summary     +----------+------------+---------+-----------+----------+--------------+ IJV           Full       Yes       Yes                             +----------+------------+---------+-----------+----------+--------------+  Subclavian    Full       Yes       Yes                             +----------+------------+---------+-----------+----------+--------------+ Axillary      Full       Yes       Yes                             +----------+------------+---------+-----------+----------+--------------+ Brachial      Full       Yes       Yes                             +----------+------------+---------+-----------+----------+--------------+ Radial        Full                                                 +----------+------------+---------+-----------+----------+--------------+ Ulnar         Full                                                  +----------+------------+---------+-----------+----------+--------------+ Cephalic                                            Not visualized +----------+------------+---------+-----------+----------+--------------+ Basilic       None       No        No                   Acute      +----------+------------+---------+-----------+----------+--------------+  Left Findings: +----------+------------+---------+-----------+----------+--------------+ LEFT      CompressiblePhasicitySpontaneousProperties   Summary     +----------+------------+---------+-----------+----------+--------------+ IJV                                                 Not visualized +----------+------------+---------+-----------+----------+--------------+ Subclavian    Full       Yes       Yes                             +----------+------------+---------+-----------+----------+--------------+ Axillary      Full       Yes       Yes                             +----------+------------+---------+-----------+----------+--------------+ Brachial      Full       Yes       Yes                             +----------+------------+---------+-----------+----------+--------------+ Radial        Full                                                 +----------+------------+---------+-----------+----------+--------------+  Ulnar         Full                                                 +----------+------------+---------+-----------+----------+--------------+ Cephalic      None       No        No                   Acute      +----------+------------+---------+-----------+----------+--------------+ Basilic       None       No        No                   Acute      +----------+------------+---------+-----------+----------+--------------+  Summary:  Right: No evidence of deep vein thrombosis in the upper extremity. Findings consistent with acute superficial vein thrombosis involving the right  basilic vein. However, unable to visualize the cephalic vein.  Left: No evidence of deep vein thrombosis in the upper extremity. Findings consistent with acute superficial vein thrombosis involving the left basilic vein and left cephalic vein. However, unable to visualize the IJV.  *See table(s) above for measurements and observations.  Diagnosing physician: Runell Countryman Electronically signed by Runell Countryman on 01/24/2024 at 10:11:18 AM.    Final    DG CHEST PORT 1 VIEW Result Date: 01/22/2024 CLINICAL DATA:  PICC line placement.  Central line placement. EXAM: PORTABLE CHEST 1 VIEW COMPARISON:  01/22/2024 at 8:04 a.m. FINDINGS: Endotracheal tube has tip 4 cm above the carina. PH probe noted over the mid to distal esophagus. Nasogastric tube and enteric tube courses into the stomach and off the film as tips are not visualized. Interval placement of left IJ central venous catheter with tip obliquely oriented over the region of the SVC. Lungs are hypoinflated without lobar consolidation or effusion. No evidence of pneumothorax. Cardiomediastinal silhouette and remainder of the exam is unchanged. IMPRESSION: 1. Interval placement of left IJ central venous catheter with tip obliquely oriented over the region of the SVC. No evidence of pneumothorax. 2. Hypoinflation without acute cardiopulmonary disease. Electronically Signed   By: Roda Cirri M.D.   On: 01/22/2024 18:03   US  EKG SITE RITE Result Date: 01/22/2024 If Site Rite image not attached, placement could not be confirmed due to current cardiac rhythm.  DG CHEST PORT 1 VIEW Result Date: 01/22/2024 CLINICAL DATA:  Intubation EXAM: PORTABLE CHEST 1 VIEW COMPARISON:  Yesterday FINDINGS: Endotracheal tube with tip difficult to define given underpenetration. No tube seen below the carina. Esophageal thermistor and enteric/gastric section tubes as described on abdominal radiograph. Low volume chest with hazy atelectatic type density. No effusion or  pneumothorax. Accentuated heart size from technique. IMPRESSION: The endotracheal tube tip is difficult to visualize given underpenetration. No tube seen below the carina. Low volume chest with mild atelectasis. Electronically Signed   By: Ronnette Coke M.D.   On: 01/22/2024 08:29   DG Abd Portable 1V Result Date: 01/22/2024 CLINICAL DATA:  Confirm orogastric tube EXAM: PORTABLE ABDOMEN - 1 VIEW COMPARISON:  Two days ago FINDINGS: Feeding tube with tip at the distal duodenum. Enteric tube with tip at the distal stomach. Normal bowel gas pattern. IMPRESSION: Feeding tube with tip at the distal duodenum. Gastric tube with tip at the distal stomach. Electronically Signed   By: Arlyce Lambert  Watts M.D.   On: 01/22/2024 08:27   DG Chest Port 1 View Result Date: 01/21/2024 CLINICAL DATA:  Acute respiratory failure. EXAM: PORTABLE CHEST 1 VIEW COMPARISON:  01/19/2024 FINDINGS: Enteric tube, weighted enteric tube and non weighted enteric tubes remain in place unchanged in position. Low lung volumes persist. Patchy basilar airspace disease persists with mild improvement. No progressive consolidation. Stable heart size and mediastinal contours. No pneumothorax or significant pleural effusion. IMPRESSION: 1. Persistent low lung volumes with patchy basilar airspace disease, mildly improved. 2. Stable support apparatus. Electronically Signed   By: Chadwick Colonel M.D.   On: 01/21/2024 11:26   DG Abd Portable 1V Result Date: 01/20/2024 CLINICAL DATA:  Abdominal distention. EXAM: PORTABLE ABDOMEN - 1 VIEW COMPARISON:  01/19/2024 FINDINGS: Normal bowel-gas pattern. Feeding tube tip in the distal 3rd portion of the duodenum. Nasogastric tube tip in the proximal 2nd portion of the duodenum. Right hip prosthesis. IMPRESSION: 1. Normal bowel-gas pattern. 2. Feeding tube tip in the distal 3rd portion of the duodenum. 3. Nasogastric tube tip in the proximal 2nd portion of the duodenum. Electronically Signed   By: Catherin Closs  M.D.   On: 01/20/2024 10:18   DG Abd 1 View Result Date: 01/19/2024 CLINICAL DATA:  NG tube placement EXAM: ABDOMEN - 1 VIEW COMPARISON:  01/18/2024 FINDINGS: Two feeding tubes are seen, 1 terminating in the distal gastric body and the other, a Dobbhoff tube, extending to the region of the distal duodenum. No dilated loops of bowel identified in the visualized abdomen. IMPRESSION: Two feeding tubes, 1 terminating in the region of the distal gastric body and the other extending to the region of the distal duodenum. Electronically Signed   By: Elester Grim M.D.   On: 01/19/2024 16:50   US  Abdomen Limited RUQ (LIVER/GB) Result Date: 01/19/2024 CLINICAL DATA:  Bile acid esophageal reflux EXAM: ULTRASOUND ABDOMEN LIMITED RIGHT UPPER QUADRANT COMPARISON:  08/10/2023 FINDINGS: Gallbladder: Gallstones: None Sludge: None Gallbladder Wall: Within normal limits Pericholecystic fluid: None Sonographic Murphy's Sign: Negative per technologist Common bile duct: Diameter: 3 mm Liver: Parenchymal echogenicity: Homogeneously increased Contours: Normal Lesions: None Portal vein: Patent.  Hepatopetal flow Other: None. IMPRESSION: Diffuse increased echogenicity of the hepatic parenchyma is a nonspecific indicator of hepatocellular dysfunction, most commonly steatosis. Electronically Signed   By: Elester Grim M.D.   On: 01/19/2024 16:48   DG CHEST PORT 1 VIEW Result Date: 01/19/2024 CLINICAL DATA:  [address] Emesis [id number] EXAM: PORTABLE CHEST - 1 VIEW COMPARISON:  Jan 18, 2024 FINDINGS: Endotracheal tube terminates in the mid trachea. Patchy airspace opacities in the lung bases. No pleural effusion or pneumothorax. The cardiac silhouette is at the upper limits of normal, likely accentuated by AP technique and low lung volumes. Weighted feeding tube courses below the diaphragm with the distal tip not included in the field of view. IMPRESSION: 1. Patchy airspace opacities in the lung bases, which may represent multifocal pneumonia or  aspiration. 2. Similarly well-positioned endotracheal tube. Electronically Signed   By: Rance Burrows M.D.   On: 01/19/2024 15:43   DG Abd 1 View Result Date: 01/18/2024 CLINICAL DATA:  NG placement. EXAM: ABDOMEN - 1 VIEW COMPARISON:  Abdominal radiograph dated 01/18/2024. FINDINGS: Feeding tube with tip in the region of the second portion of the duodenum. Air is noted in the stomach. No acute osseous pathology. IMPRESSION: Feeding tube with tip in the region of the second portion of the duodenum. Electronically Signed   By: Angus Bark M.D.   On:  01/18/2024 13:57   DG Abd Portable 1 View Result Date: 01/18/2024 CLINICAL DATA:  Nasogastric tube placement EXAM: PORTABLE ABDOMEN - 1 VIEW COMPARISON:  None Available. FINDINGS: Nasogastric tube tip overlies the expected distal esophagus at least 15 cm above the gastroesophageal junction. The abdominal gas pattern is indeterminate due to a paucity of intra-abdominal gas. IMPRESSION: 1. Nasogastric tube tip within the distal esophagus at least 15 cm above the gastroesophageal junction. Advancement of the catheter by 20-25 cm would optimally position the device. Electronically Signed   By: Worthy Heads M.D.   On: 01/18/2024 05:54   DG Abd Portable 1V Result Date: 01/18/2024 CLINICAL DATA:  Nasogastric tube placement EXAM: PORTABLE ABDOMEN - 1 VIEW COMPARISON:  Earlier the same day FINDINGS: No visible enteric tube. The epigastrium is incompletely covered. Moderate gaseous distention of the stomach. IMPRESSION: No visible enteric tube. The epigastrium is incompletely covered. Moderate gaseous distention of the stomach. Electronically Signed   By: Ronnette Coke M.D.   On: 01/18/2024 04:52   DG Chest Portable 1 View Result Date: 01/18/2024 CLINICAL DATA:  Check endotracheal tube placement EXAM: PORTABLE CHEST 1 VIEW COMPARISON:  01/17/2024 FINDINGS: Cardiac shadow is enlarged but accentuated by the frontal technique. Endotracheal tube is noted in  satisfactory position. The lungs are hypoinflated but clear. IMPRESSION: Tubes and lines as described. Electronically Signed   By: Violeta Grey M.D.   On: 01/18/2024 02:55   DG Abd Portable 1V Result Date: 01/18/2024 CLINICAL DATA:  Check gastric catheter placement EXAM: PORTABLE ABDOMEN - 1 VIEW COMPARISON:  None Available. FINDINGS: Gastric catheter is noted in the distal esophagus. This should be advanced several cm deeper into the stomach. No obstructive changes are seen. IMPRESSION: Gastric catheter in the distal esophagus. This should be advanced several cm into the abdomen. Electronically Signed   By: Violeta Grey M.D.   On: 01/18/2024 02:54   CT HEAD WO CONTRAST ( ) Result Date: 01/17/2024 CLINICAL DATA:  Altered mental status EXAM: CT HEAD WITHOUT CONTRAST TECHNIQUE: Contiguous axial images were obtained from the base of the skull through the vertex without intravenous contrast. RADIATION DOSE REDUCTION: This exam was performed according to the departmental dose-optimization program which includes automated exposure control, adjustment of the mA and/or kV according to patient size and/or use of iterative reconstruction technique. COMPARISON:  11/28/2022 FINDINGS: Brain: No acute intracranial abnormality. Specifically, no hemorrhage, hydrocephalus, mass lesion, acute infarction, or significant intracranial injury. Vascular: No hyperdense vessel or unexpected calcification. Skull: No acute calvarial abnormality. Sinuses/Orbits: No acute findings Other: None IMPRESSION: No acute intracranial abnormality. Electronically Signed   By: Janeece Mechanic M.D.   On: 01/17/2024 23:35   DG Chest Portable 1 View Result Date: 01/17/2024 CLINICAL DATA:  Altered mental status EXAM: PORTABLE CHEST 1 VIEW COMPARISON:  08/11/2023 FINDINGS: Cardiac shadow is enlarged. The lungs are hypoinflated but clear. No bony abnormality is noted. IMPRESSION: No acute abnormality noted. Electronically Signed   By: Violeta Grey M.D.    On: 01/17/2024 23:08   (Echo, Carotid, EGD, Colonoscopy, ERCP)    Subjective: Patient seen and examined.  Still has some pain mostly on the right side of the chest on deep breathing.  Walking around.  Remains on room air.  Comfortable with plan to go home.   Discharge Exam: Vitals:   02/04/24 0215 02/04/24 0436  BP:  133/70  Pulse:  88  Resp:  18  Temp: 99.8 F (37.7 C) 99.3 F (37.4 C)  SpO2:  96%  Vitals:   02/03/24 1228 02/03/24 2019 02/04/24 0215 02/04/24 0436  BP: 139/78 134/74  133/70  Pulse: 96 93  88  Resp: 18 20  18   Temp: 99.5 F (37.5 C) 98.5 F (36.9 C) 99.8 F (37.7 C) 99.3 F (37.4 C)  TempSrc: Oral Oral Oral Oral  SpO2: 96% 95%  96%  Weight:      Height:        General: Pt is alert, awake, not in acute distress Cardiovascular: RRR, S1/S2 +, no rubs, no gallops Respiratory: CTA bilaterally, no wheezing, no rhonchi Abdominal: Soft, NT, ND, bowel sounds + Extremities: no edema, no cyanosis    The results of significant diagnostics from this hospitalization (including imaging, microbiology, ancillary and laboratory) are listed below for reference.     Microbiology: Recent Results (from the past 240 hours)  Blood Culture (routine x 2)     Status: None (Preliminary result)   Collection Time: 01/31/24  3:46 AM   Specimen: BLOOD  Result Value Ref Range Status   Specimen Description   Final    BLOOD BLOOD RIGHT FOREARM Performed at [facility], [address] [place], [place] [address]    Special Requests   Final    BOTTLES DRAWN AEROBIC AND ANAEROBIC Blood Culture adequate volume Performed at [facility], [address] [place], [place] [address]    Culture   Final    NO GROWTH 3 DAYS Performed at [facility], [address] [place], [place] [address]    Report Status PENDING  Incomplete  Resp panel by RT-PCR (RSV, Flu A&B, Covid)     Status: None   Collection Time: 01/31/24  4:15 AM    Specimen: Nasal Swab  Result Value Ref Range Status   SARS Coronavirus 2 by RT PCR NEGATIVE NEGATIVE Final    Comment: (NOTE) SARS-CoV-2 target nucleic acids are NOT DETECTED.  The SARS-CoV-2 RNA is generally detectable in upper respiratory specimens during the acute phase of infection. The lowest concentration of SARS-CoV-2 viral copies this assay can detect is 138 copies/mL. A negative result does not preclude SARS-Cov-2 infection and should not be used as the sole basis for treatment or other patient management decisions. A negative result may occur with  improper specimen collection/handling, submission of specimen other than nasopharyngeal swab, presence of viral mutation(s) within the areas targeted by this assay, and inadequate number of viral copies(<138 copies/mL). A negative result must be combined with clinical observations, patient history, and epidemiological information. The expected result is Negative.  Fact Sheet for Patients:  BloggerCourse.com  Fact Sheet for Healthcare Providers:  SeriousBroker.it  This test is no t yet approved or cleared by the [place]  FDA and  has been authorized for detection and/or diagnosis of SARS-CoV-2 by FDA under an Emergency Use Authorization (EUA). This EUA will remain  in effect (meaning this test can be used) for the duration of the COVID-19 declaration under Section 564(b)(1) of the Act, 21 U.S.C.section 360bbb-3(b)(1), unless the authorization is terminated  or revoked sooner.       Influenza A by PCR NEGATIVE NEGATIVE Final   Influenza B by PCR NEGATIVE NEGATIVE Final    Comment: (NOTE) The Xpert Xpress SARS-CoV-2/FLU/RSV plus assay is intended as an aid in the diagnosis of influenza from Nasopharyngeal swab specimens and should not be used as a sole basis for treatment. Nasal washings and aspirates are unacceptable for Xpert Xpress SARS-CoV-2/FLU/RSV testing.  Fact  Sheet for Patients:  BloggerCourse.com  Fact Sheet for Healthcare Providers: SeriousBroker.it  This test is not yet approved or cleared by the [place]  FDA and has been authorized for detection and/or diagnosis of SARS-CoV-2 by FDA under an Emergency Use Authorization (EUA). This EUA will remain in effect (meaning this test can be used) for the duration of the COVID-19 declaration under Section 564(b)(1) of the Act, 21 U.S.C. section 360bbb-3(b)(1), unless the authorization is terminated or revoked.     Resp Syncytial Virus by PCR NEGATIVE NEGATIVE Final    Comment: (NOTE) Fact Sheet for Patients: BloggerCourse.com  Fact Sheet for Healthcare Providers: SeriousBroker.it  This test is not yet approved or cleared by the [place]  FDA and has been authorized for detection and/or diagnosis of SARS-CoV-2 by FDA under an Emergency Use Authorization (EUA). This EUA will remain in effect (meaning this test can be used) for the duration of the COVID-19 declaration under Section 564(b)(1) of the Act, 21 U.S.C. section 360bbb-3(b)(1), unless the authorization is terminated or revoked.  Performed at [facility], [address] [place], [place] [address]   Blood Culture (routine x 2)     Status: None (Preliminary result)   Collection Time: 01/31/24  4:10 PM   Specimen: BLOOD RIGHT HAND  Result Value Ref Range Status   Specimen Description   Final    BLOOD RIGHT HAND Performed at [facility], [address] [place], [place] [address]    Special Requests   Final    BOTTLES DRAWN AEROBIC AND ANAEROBIC Blood Culture adequate volume Performed at [facility], [address], [place], [place] [address]    Culture   Final    NO GROWTH 3 DAYS Performed at [facility], [address], [place], [place] [address]    Report Status  PENDING  Incomplete     Labs: BNP (last 3 results) No results for input(s): "BNP" in the last 8760 hours. Basic Metabolic Panel: Recent Labs  Lab 01/30/24 2344 01/31/24 0416 01/31/24 1610 02/01/24 0105 02/02/24 0507 02/03/24 0805  NA 129* 128*  --  131* 129* 132*  K 3.8 3.7  --  4.4 4.2 4.0  CL 95* 94*  --  97* 96* 96*  CO2 20* 21*  --  22 23 22   GLUCOSE 112* 111*  --  94 111* 97  BUN 22* 22*  --  14 6 16   CREATININE 0.96 1.32*  --  0.89 <0.30* 0.93  CALCIUM  9.2 9.2  --  8.6* 8.7* 8.6*  MG  --   --  2.3  --  2.6*  --   PHOS  --   --  4.4  --  3.0  --    Liver Function Tests: Recent Labs  Lab 01/31/24 0416 02/01/24 0105 02/02/24 0507 02/03/24 0805  AST 126* 75* 46* 68*  ALT 239* 172* 105* 100*  ALKPHOS 284* 247* 198* 231*  BILITOT 0.9 1.2 1.3* 1.2  PROT 8.1 7.1 6.6 7.4  ALBUMIN 4.0 3.3* 2.8* 3.0*   No results for input(s): "LIPASE", "AMYLASE" in the last 168 hours. Recent Labs  Lab 01/29/24 1042  AMMONIA 38*   CBC: Recent Labs  Lab 01/29/24 1042 01/30/24 0921 01/30/24 2344 02/01/24 0105 02/03/24 0805  WBC 11.3* 13.2* 13.3* 12.7* 11.4*  NEUTROABS  --   --  9.4*  --  8.4*  HGB 11.7* 11.9* 12.7* 11.2* 10.2*  HCT 35.9* 35.2* 37.5* 35.1* 32.6*  MCV 91.8 89.3 88.2 93.9 94.2  PLT 248 255 307 279 348   Cardiac Enzymes: Recent Labs  Lab 01/31/24 1610  CKTOTAL 112   BNP: Invalid input(s): "POCBNP" CBG: Recent Labs  Lab 01/29/24 2001 01/30/24 0029 01/30/24 0412 01/30/24 0816 01/30/24 1150  GLUCAP 98 101* 101* 88 94   D-Dimer No results for input(s): "DDIMER" in the last 72 hours. Hgb A1c No results for input(s): "HGBA1C" in the last 72 hours. Lipid Profile No results for input(s): "CHOL", "HDL", "LDLCALC", "TRIG", "CHOLHDL", "LDLDIRECT" in the last 72 hours. Thyroid function studies No results for input(s): "TSH", "T4TOTAL", "T3FREE", "THYROIDAB" in the last 72 hours.  Invalid input(s): "FREET3" Anemia work up No results for input(s):  "VITAMINB12", "FOLATE", "FERRITIN", "TIBC", "IRON", "RETICCTPCT" in the last 72 hours. Urinalysis    Component Value Date/Time   COLORURINE AMBER (A) 01/31/2024 0909   APPEARANCEUR CLEAR 01/31/2024 0909   LABSPEC 1.035 (H) 01/31/2024 0909   PHURINE 5.0 01/31/2024 0909   GLUCOSEU NEGATIVE 01/31/2024 0909   HGBUR NEGATIVE 01/31/2024 0909   BILIRUBINUR NEGATIVE 01/31/2024 0909   KETONESUR NEGATIVE 01/31/2024 0909   PROTEINUR NEGATIVE 01/31/2024 0909   NITRITE NEGATIVE 01/31/2024 0909   LEUKOCYTESUR NEGATIVE 01/31/2024 0909   Sepsis Labs Recent Labs  Lab 01/30/24 0921 01/30/24 2344 02/01/24 0105 02/03/24 0805  WBC 13.2* 13.3* 12.7* 11.4*   Microbiology Recent Results (from the past 240 hours)  Blood Culture (routine x 2)     Status: None (Preliminary result)   Collection Time: 01/31/24  3:46 AM   Specimen: BLOOD  Result Value Ref Range Status   Specimen Description   Final    BLOOD BLOOD RIGHT FOREARM Performed at [facility], [address] [place], [place] [address]    Special Requests   Final    BOTTLES DRAWN AEROBIC AND ANAEROBIC Blood Culture adequate volume Performed at [facility], [address] [place], [place] [address]    Culture   Final    NO GROWTH 3 DAYS Performed at [facility], [address] [place], [place] [address]    Report Status PENDING  Incomplete  Resp panel by RT-PCR (RSV, Flu A&B, Covid)     Status: None   Collection Time: 01/31/24  4:15 AM   Specimen: Nasal Swab  Result Value Ref Range Status   SARS Coronavirus 2 by RT PCR NEGATIVE NEGATIVE Final    Comment: (NOTE) SARS-CoV-2 target nucleic acids are NOT DETECTED.  The SARS-CoV-2 RNA is generally detectable in upper respiratory specimens during the acute phase of infection. The lowest concentration of SARS-CoV-2 viral copies this assay can detect is 138 copies/mL. A negative result does not preclude SARS-Cov-2 infection and should not be  used as the sole basis for treatment or other patient management decisions. A negative result may occur with  improper specimen collection/handling, submission of specimen other than nasopharyngeal swab, presence of viral mutation(s) within the areas targeted by this assay, and inadequate number of viral copies(<138 copies/mL). A negative result must be combined with clinical observations, patient history, and epidemiological information. The expected result is Negative.  Fact Sheet for Patients:  BloggerCourse.com  Fact Sheet for Healthcare Providers:  SeriousBroker.it  This test is no t yet approved or cleared by the [place]  FDA and  has been authorized for detection and/or diagnosis of SARS-CoV-2 by FDA under an Emergency Use Authorization (EUA). This EUA will remain  in effect (meaning this test can be used) for the duration of the COVID-19 declaration under Section  564(b)(1) of the Act, 21 U.S.C.section 360bbb-3(b)(1), unless the authorization is terminated  or revoked sooner.       Influenza A by PCR NEGATIVE NEGATIVE Final   Influenza B by PCR NEGATIVE NEGATIVE Final    Comment: (NOTE) The Xpert Xpress SARS-CoV-2/FLU/RSV plus assay is intended as an aid in the diagnosis of influenza from Nasopharyngeal swab specimens and should not be used as a sole basis for treatment. Nasal washings and aspirates are unacceptable for Xpert Xpress SARS-CoV-2/FLU/RSV testing.  Fact Sheet for Patients: BloggerCourse.com  Fact Sheet for Healthcare Providers: SeriousBroker.it  This test is not yet approved or cleared by the [place]  FDA and has been authorized for detection and/or diagnosis of SARS-CoV-2 by FDA under an Emergency Use Authorization (EUA). This EUA will remain in effect (meaning this test can be used) for the duration of the COVID-19 declaration under Section  564(b)(1) of the Act, 21 U.S.C. section 360bbb-3(b)(1), unless the authorization is terminated or revoked.     Resp Syncytial Virus by PCR NEGATIVE NEGATIVE Final    Comment: (NOTE) Fact Sheet for Patients: BloggerCourse.com  Fact Sheet for Healthcare Providers: SeriousBroker.it  This test is not yet approved or cleared by the [place]  FDA and has been authorized for detection and/or diagnosis of SARS-CoV-2 by FDA under an Emergency Use Authorization (EUA). This EUA will remain in effect (meaning this test can be used) for the duration of the COVID-19 declaration under Section 564(b)(1) of the Act, 21 U.S.C. section 360bbb-3(b)(1), unless the authorization is terminated or revoked.  Performed at [facility], [address], [place], [place] [address]   Blood Culture (routine x 2)     Status: None (Preliminary result)   Collection Time: 01/31/24  4:10 PM   Specimen: BLOOD RIGHT HAND  Result Value Ref Range Status   Specimen Description   Final    BLOOD RIGHT HAND Performed at [facility], [address] [place], [place] [address]    Special Requests   Final    BOTTLES DRAWN AEROBIC AND ANAEROBIC Blood Culture adequate volume Performed at [facility], [address] [place], [place] [address]    Culture   Final    NO GROWTH 3 DAYS Performed at [facility], [address], [place], [place] [address]    Report Status PENDING  Incomplete     Time coordinating discharge: 40 minutes  SIGNED:   Vada Garibaldi, MD  [facility] 02/04/2024, 9:36 AM

## 2024-02-04 NOTE — Progress Notes (Unsigned)
 {  Select_TRH_Note:[id number]}

## 2024-02-04 NOTE — Plan of Care (Signed)
 VSS. Patient given PRN Norco and Dilaudid  for pain consistently throughout shift. LBM 6/6. No acute events overnight.  Problem: Education: Goal: Knowledge of General Education information will improve Description: Including pain rating scale, medication(s)/side effects and non-pharmacologic comfort measures Outcome: Progressing   Problem: Health Behavior/Discharge Planning: Goal: Ability to manage health-related needs will improve Outcome: Progressing   Problem: Clinical Measurements: Goal: Ability to maintain clinical measurements within normal limits will improve Outcome: Progressing Goal: Respiratory complications will improve Outcome: Progressing   Problem: Pain Managment: Goal: General experience of comfort will improve and/or be controlled Outcome: Progressing   Problem: Safety: Goal: Ability to remain free from injury will improve Outcome: Progressing

## 2024-02-04 NOTE — TOC Progression Note (Addendum)
 Transition of Care [facility]) - Progression Note    Patient Details  Name: Juan Clarke MRN: [id number] Date of Birth: 04-21-1981  Transition of Care [facility]) CM/SW Contact  Levie Ream, RN Phone Number: 02/04/2024, 11:12 AM  Clinical Narrative:    D/C orders received; spoke w/ Bonnell Butcher, Admissions at [facility]; she says updated progress notes needed for review; she gave fax # [phone]; once reviewed, the 1st available spot is for tomorrow at 1230; Dr Hilton Lucky notified via secure chat; documents will faxed per request.  -1200- spoke w/ pt in room; he was updated on status of referral, and agrees to d/c plan; progress note, d/c summary, and face sheet faxed per request at 1204; awaiting electronic confirmation.  Expected Discharge Plan: Home/Self Care Barriers to Discharge: Continued Medical Work up  Expected Discharge Plan and Services In-house Referral: NA Discharge Planning Services: CM Consult Post Acute Care Choice: NA (NA) Living arrangements for the past 2 months: Single Family Home Expected Discharge Date: 02/04/24               DME Arranged: N/A DME Agency: NA       HH Arranged: NA HH Agency: NA         Social Determinants of Health (SDOH) Interventions SDOH Screenings   Food Insecurity: No Food Insecurity (01/31/2024)  Housing: Low Risk  (01/31/2024)  Transportation Needs: No Transportation Needs (01/31/2024)  Utilities: Not At Risk (01/31/2024)  Social Connections: Unknown (01/11/2022)   Received from [facility], [facility]  Tobacco Use: Low Risk  (01/30/2024)    Readmission Risk Interventions    02/01/2024    1:16 PM 01/20/2024   12:44 PM 01/20/2024    9:59 AM  Readmission Risk Prevention Plan  Transportation Screening Complete Complete Complete  PCP or Specialist Appt within 3-5 Days  Complete Complete  HRI or Home Care Consult  Complete   Social Work Consult for Recovery Care Planning/Counseling  Complete   Palliative Care Screening    Complete  Medication Review Oceanographer) Complete Complete   PCP or Specialist appointment within 3-5 days of discharge Complete    HRI or Home Care Consult Complete    SW Recovery Care/Counseling Consult Complete    Palliative Care Screening Not Applicable    Skilled Nursing Facility Complete

## 2024-02-04 NOTE — TOC Transition Note (Addendum)
 Transition of Care [facility]) - Discharge Note   Patient Details  Name: Juan Clarke MRN: [id number] Date of Birth: [birth year]  Transition of Care [facility]) CM/SW Contact:  Levie Ream, RN Phone Number: 02/04/2024, 12:13 PM   Clinical Narrative:    Holli Lunger at [facility] that documents have been faxed, and awaiting electronic confirmation; she says documents have not yet been received; she will notify this RN, CM when received.  -1220- notified by Bonnell Butcher that documents have been received and sent for review; she will notify this RN, CM on facility's decision.  -1309- notified by Bonnell Butcher at [facility] that she will contact pt to set up assessment at facility; no TOC needs.  Final next level of care: Home/Self Care Barriers to Discharge: No Barriers Identified   Patient Goals and CMS Choice Patient states their goals for this hospitalization and ongoing recovery are:: Return home CMS Medicare.gov Compare Post Acute Care list provided to:: Other (Comment Required) (NA) Choice offered to / list presented to : NA  ownership interest in [facility].provided to:: Parent NA    Discharge Placement                       Discharge Plan and Services Additional resources added to the After Visit Summary for   In-house Referral: NA Discharge Planning Services: CM Consult Post Acute Care Choice: NA (NA)          DME Arranged: N/A DME Agency: NA       HH Arranged: NA HH Agency: NA        Social Drivers of Health (SDOH) Interventions SDOH Screenings   Food Insecurity: No Food Insecurity (01/31/2024)  Housing: Low Risk  (01/31/2024)  Transportation Needs: No Transportation Needs (01/31/2024)  Utilities: Not At Risk (01/31/2024)  Social Connections: Unknown (01/11/2022)   Received from [facility], [facility]  Tobacco Use: Low Risk  (01/30/2024)     Readmission Risk Interventions    02/01/2024    1:16 PM 01/20/2024   12:44 PM 01/20/2024     9:59 AM  Readmission Risk Prevention Plan  Transportation Screening Complete Complete Complete  PCP or Specialist Appt within 3-5 Days  Complete Complete  HRI or Home Care Consult  Complete   Social Work Consult for Recovery Care Planning/Counseling  Complete   Palliative Care Screening   Complete  Medication Review Oceanographer) Complete Complete   PCP or Specialist appointment within 3-5 days of discharge Complete    HRI or Home Care Consult Complete    SW Recovery Care/Counseling Consult Complete    Palliative Care Screening Not Applicable    Skilled Nursing Facility Complete

## 2024-02-05 LAB — CULTURE, BLOOD (ROUTINE X 2)
Culture: NO GROWTH
Culture: NO GROWTH
Special Requests: ADEQUATE
Special Requests: ADEQUATE

## 2024-02-07 ENCOUNTER — Observation Stay (HOSPITAL_COMMUNITY)
Admission: EM | Admit: 2024-02-07 | Discharge: 2024-02-09 | Disposition: A | Discharge status: Home / Self Care | Attending: Internal Medicine | Admitting: Internal Medicine

## 2024-02-07 ENCOUNTER — Encounter (HOSPITAL_COMMUNITY): Payer: Self-pay

## 2024-02-07 ENCOUNTER — Emergency Department (HOSPITAL_COMMUNITY)

## 2024-02-07 DIAGNOSIS — A419 Sepsis, unspecified organism: Secondary | ICD-10-CM

## 2024-02-07 DIAGNOSIS — I2699 Other pulmonary embolism without acute cor pulmonale: Secondary | ICD-10-CM | POA: Diagnosis not present

## 2024-02-07 DIAGNOSIS — M109 Gout, unspecified: Secondary | ICD-10-CM | POA: Insufficient documentation

## 2024-02-07 DIAGNOSIS — D649 Anemia, unspecified: Secondary | ICD-10-CM | POA: Diagnosis not present

## 2024-02-07 DIAGNOSIS — E039 Hypothyroidism, unspecified: Secondary | ICD-10-CM | POA: Diagnosis not present

## 2024-02-07 DIAGNOSIS — I10 Essential (primary) hypertension: Secondary | ICD-10-CM | POA: Diagnosis not present

## 2024-02-07 DIAGNOSIS — I82433 Acute embolism and thrombosis of popliteal vein, bilateral: Secondary | ICD-10-CM | POA: Diagnosis not present

## 2024-02-07 DIAGNOSIS — E8809 Other disorders of plasma-protein metabolism, not elsewhere classified: Secondary | ICD-10-CM | POA: Diagnosis not present

## 2024-02-07 DIAGNOSIS — F1010 Alcohol abuse, uncomplicated: Secondary | ICD-10-CM | POA: Diagnosis not present

## 2024-02-07 DIAGNOSIS — F1020 Alcohol dependence, uncomplicated: Secondary | ICD-10-CM | POA: Diagnosis not present

## 2024-02-07 DIAGNOSIS — Z6836 Body mass index (BMI) 36.0-36.9, adult: Secondary | ICD-10-CM | POA: Diagnosis not present

## 2024-02-07 DIAGNOSIS — I959 Hypotension, unspecified: Secondary | ICD-10-CM | POA: Diagnosis present

## 2024-02-07 DIAGNOSIS — Z79899 Other long term (current) drug therapy: Secondary | ICD-10-CM | POA: Insufficient documentation

## 2024-02-07 DIAGNOSIS — J189 Pneumonia, unspecified organism: Principal | ICD-10-CM | POA: Insufficient documentation

## 2024-02-07 DIAGNOSIS — E66812 Obesity, class 2: Secondary | ICD-10-CM | POA: Insufficient documentation

## 2024-02-07 DIAGNOSIS — I82432 Acute embolism and thrombosis of left popliteal vein: Secondary | ICD-10-CM | POA: Diagnosis not present

## 2024-02-07 DIAGNOSIS — R748 Abnormal levels of other serum enzymes: Secondary | ICD-10-CM | POA: Diagnosis present

## 2024-02-07 DIAGNOSIS — Y95 Nosocomial condition: Principal | ICD-10-CM

## 2024-02-07 DIAGNOSIS — E871 Hypo-osmolality and hyponatremia: Secondary | ICD-10-CM | POA: Insufficient documentation

## 2024-02-07 DIAGNOSIS — R6520 Severe sepsis without septic shock: Secondary | ICD-10-CM | POA: Diagnosis present

## 2024-02-07 DIAGNOSIS — D696 Thrombocytopenia, unspecified: Secondary | ICD-10-CM | POA: Diagnosis not present

## 2024-02-07 DIAGNOSIS — E66813 Obesity, class 3: Secondary | ICD-10-CM | POA: Diagnosis present

## 2024-02-07 LAB — RESP PANEL BY RT-PCR (RSV, FLU A&B, COVID)  RVPGX2
Influenza A by PCR: NEGATIVE
Influenza B by PCR: NEGATIVE
Resp Syncytial Virus by PCR: NEGATIVE
SARS Coronavirus 2 by RT PCR: NEGATIVE

## 2024-02-07 LAB — BLOOD GAS, VENOUS
Acid-base deficit: 0.1 mmol/L (ref 0.0–2.0)
Bicarbonate: 23.9 mmol/L (ref 20.0–28.0)
O2 Saturation: 86 %
Patient temperature: 37
pCO2, Ven: 36 mmHg — ABNORMAL LOW (ref 44–60)
pH, Ven: 7.43 (ref 7.25–7.43)
pO2, Ven: 53 mmHg — ABNORMAL HIGH (ref 32–45)

## 2024-02-07 LAB — HEPATIC FUNCTION PANEL
ALT: 138 U/L — ABNORMAL HIGH (ref 0–44)
AST: 49 U/L — ABNORMAL HIGH (ref 15–41)
Albumin: 3.2 g/dL — ABNORMAL LOW (ref 3.5–5.0)
Alkaline Phosphatase: 291 U/L — ABNORMAL HIGH (ref 38–126)
Bilirubin, Direct: 0.2 mg/dL (ref 0.0–0.2)
Indirect Bilirubin: 0.5 mg/dL (ref 0.3–0.9)
Total Bilirubin: 0.7 mg/dL (ref 0.0–1.2)
Total Protein: 7.6 g/dL (ref 6.5–8.1)

## 2024-02-07 LAB — CBC
HCT: 33.9 % — ABNORMAL LOW (ref 39.0–52.0)
Hemoglobin: 10.8 g/dL — ABNORMAL LOW (ref 13.0–17.0)
MCH: 29.1 pg (ref 26.0–34.0)
MCHC: 31.9 g/dL (ref 30.0–36.0)
MCV: 91.4 fL (ref 80.0–100.0)
Platelets: 614 10*3/uL — ABNORMAL HIGH (ref 150–400)
RBC: 3.71 MIL/uL — ABNORMAL LOW (ref 4.22–5.81)
RDW: 13.1 % (ref 11.5–15.5)
WBC: 10.5 10*3/uL (ref 4.0–10.5)
nRBC: 0 % (ref 0.0–0.2)

## 2024-02-07 LAB — BASIC METABOLIC PANEL WITH GFR
Anion gap: 14 (ref 5–15)
BUN: 15 mg/dL (ref 6–20)
CO2: 19 mmol/L — ABNORMAL LOW (ref 22–32)
Calcium: 8.9 mg/dL (ref 8.9–10.3)
Chloride: 98 mmol/L (ref 98–111)
Creatinine, Ser: 1.08 mg/dL (ref 0.61–1.24)
GFR, Estimated: >60 mL/min (ref >60)
Glucose, Bld: 92 mg/dL (ref 70–99)
Potassium: 4.2 mmol/L (ref 3.5–5.1)
Sodium: 131 mmol/L — ABNORMAL LOW (ref 135–145)

## 2024-02-07 LAB — IRON AND TIBC
Iron: 40 ug/dL — ABNORMAL LOW (ref 45–182)
Saturation Ratios: 12 % — ABNORMAL LOW (ref 17.9–39.5)
TIBC: 346 ug/dL (ref 250–450)
UIBC: 306 ug/dL

## 2024-02-07 LAB — LACTIC ACID, PLASMA: Lactic Acid, Venous: 1.8 mmol/L (ref 0.5–1.9)

## 2024-02-07 LAB — PROTIME-INR
INR: 1.3 — ABNORMAL HIGH (ref 0.8–1.2)
Prothrombin Time: 16.5 s — ABNORMAL HIGH (ref 11.4–15.2)

## 2024-02-07 LAB — FERRITIN: Ferritin: 292 ng/mL (ref 24–336)

## 2024-02-07 LAB — TROPONIN I (HIGH SENSITIVITY)
Troponin I (High Sensitivity): 4 ng/L (ref <18)
Troponin I (High Sensitivity): 3 ng/L (ref <18)

## 2024-02-07 LAB — PHOSPHORUS: Phosphorus: 4.6 mg/dL (ref 2.5–4.6)

## 2024-02-07 LAB — APTT: aPTT: 45 s — ABNORMAL HIGH (ref 24–36)

## 2024-02-07 LAB — MRSA NEXT GEN BY PCR, NASAL: MRSA by PCR Next Gen: NOT DETECTED

## 2024-02-07 LAB — CK: Total CK: 63 U/L (ref 49–397)

## 2024-02-07 LAB — BRAIN NATRIURETIC PEPTIDE: B Natriuretic Peptide: 8.9 pg/mL (ref 0.0–100.0)

## 2024-02-07 LAB — PROCALCITONIN: Procalcitonin: <0.1 ng/mL

## 2024-02-07 LAB — I-STAT CG4 LACTIC ACID, ED: Lactic Acid, Venous: 1.5 mmol/L (ref 0.5–1.9)

## 2024-02-07 LAB — MAGNESIUM: Magnesium: 2.3 mg/dL (ref 1.7–2.4)

## 2024-02-07 MED ORDER — PNEUMOCOCCAL 20-VAL CONJ VACC 0.5 ML IM SUSY
0.5000 mL | PREFILLED_SYRINGE | INTRAMUSCULAR | Status: AC
  Administered 2024-02-08: 0.5 mL via INTRAMUSCULAR
  Filled 2024-02-07: qty 0.5

## 2024-02-07 MED ORDER — OXYCODONE HCL 5 MG PO TABS
5.0000 mg | ORAL_TABLET | Freq: Once | ORAL | Status: AC
  Administered 2024-02-07: 5 mg via ORAL
  Filled 2024-02-07: qty 1

## 2024-02-07 MED ORDER — SODIUM CHLORIDE 0.9 % IV SOLN: INTRAVENOUS | Status: DC

## 2024-02-07 MED ORDER — LORAZEPAM 1 MG PO TABS: 0.0000 mg | ORAL_TABLET | Freq: Four times a day (QID) | ORAL | Status: DC

## 2024-02-07 MED ORDER — VANCOMYCIN HCL IN DEXTROSE 1-5 GM/200ML-% IV SOLN
1000.0000 mg | Freq: Once | INTRAVENOUS | Status: AC
Start: 2024-02-07 — End: 2024-02-08
  Administered 2024-02-07: 1000 mg via INTRAVENOUS
  Filled 2024-02-07: qty 200

## 2024-02-07 MED ORDER — THIAMINE HCL 100 MG/ML IJ SOLN
100.0000 mg | Freq: Every day | INTRAMUSCULAR | Status: DC
Start: 2024-02-07 — End: 2024-02-09
  Administered 2024-02-07: 100 mg via INTRAVENOUS
  Filled 2024-02-07: qty 2

## 2024-02-07 MED ORDER — LORAZEPAM 1 MG PO TABS: 0.0000 mg | ORAL_TABLET | Freq: Two times a day (BID) | ORAL | Status: DC

## 2024-02-07 MED ORDER — FENTANYL CITRATE PF 50 MCG/ML IJ SOSY
25.0000 ug | PREFILLED_SYRINGE | INTRAMUSCULAR | Status: DC | PRN
  Administered 2024-02-07 – 2024-02-08 (×6): 25 ug via INTRAVENOUS
  Filled 2024-02-07 (×6): qty 1

## 2024-02-07 MED ORDER — LORAZEPAM 2 MG/ML IJ SOLN: 0.0000 mg | Freq: Four times a day (QID) | INTRAMUSCULAR | Status: DC

## 2024-02-07 MED ORDER — METHOCARBAMOL 1000 MG/10ML IJ SOLN
500.0000 mg | Freq: Three times a day (TID) | INTRAMUSCULAR | Status: DC | PRN
  Administered 2024-02-09: 500 mg via INTRAVENOUS
  Filled 2024-02-07: qty 10

## 2024-02-07 MED ORDER — LACTATED RINGERS IV BOLUS (SEPSIS)
1000.0000 mL | Freq: Once | INTRAVENOUS | Status: AC
  Administered 2024-02-07: 1000 mL via INTRAVENOUS

## 2024-02-07 MED ORDER — SODIUM CHLORIDE 0.9 % IV SOLN
100.0000 mg | Freq: Two times a day (BID) | INTRAVENOUS | Status: DC
  Administered 2024-02-07 – 2024-02-08 (×3): 100 mg via INTRAVENOUS
  Filled 2024-02-07 (×5): qty 100

## 2024-02-07 MED ORDER — THIAMINE MONONITRATE 100 MG PO TABS
100.0000 mg | ORAL_TABLET | Freq: Every day | ORAL | Status: DC
  Administered 2024-02-08 – 2024-02-09 (×2): 100 mg via ORAL
  Filled 2024-02-07 (×3): qty 1

## 2024-02-07 MED ORDER — LORAZEPAM 2 MG/ML IJ SOLN: 0.0000 mg | Freq: Two times a day (BID) | INTRAMUSCULAR | Status: DC

## 2024-02-07 MED ORDER — SODIUM CHLORIDE 0.9 % IV SOLN
2.0000 g | Freq: Three times a day (TID) | INTRAVENOUS | Status: DC
  Administered 2024-02-08 – 2024-02-09 (×4): 2 g via INTRAVENOUS
  Filled 2024-02-07 (×4): qty 12.5

## 2024-02-07 MED ORDER — VANCOMYCIN HCL 1250 MG/250ML IV SOLN
1250.0000 mg | Freq: Two times a day (BID) | INTRAVENOUS | Status: DC
  Administered 2024-02-08 – 2024-02-09 (×3): 1250 mg via INTRAVENOUS
  Filled 2024-02-07 (×5): qty 250

## 2024-02-07 MED ORDER — SODIUM CHLORIDE 0.9 % IV SOLN
2.0000 g | Freq: Once | INTRAVENOUS | Status: AC
  Administered 2024-02-07: 2 g via INTRAVENOUS
  Filled 2024-02-07: qty 12.5

## 2024-02-07 NOTE — Sepsis Progress Note (Signed)
 Elink monitoring for the code sepsis protocol.

## 2024-02-07 NOTE — Progress Notes (Signed)
 Pharmacy Antibiotic Note  Juan Clarke is a 43 y.o. male admitted on 02/07/2024 with hypotension and presyncope, persistent fevers .  Pharmacy has been consulted for vancomycin  for pna.  1st dose given in ED  Plan: Vancomycin  1250mg  IV q12h (AUC 483.1, Scr 1.08) Follow renal function, cultures and clinical course     Temp (24hrs), Avg:98.1 F (36.7 C), Min:98.1 F (36.7 C), Max:98.1 F (36.7 C)  Recent Labs  Lab 02/01/24 0105 02/02/24 0507 02/03/24 0805 02/07/24 1933 02/07/24 2044 02/07/24 2141  WBC 12.7*  --  11.4* 10.5  --   --   CREATININE 0.89 <0.30* 0.93 1.08  --   --   LATICACIDVEN  --   --   --   --  1.8 1.5    Estimated Creatinine Clearance: 120.3 mL/min (by C-G formula based on SCr of 1.08 mg/dL).    Allergies  Allergen Reactions   Lexapro [Escitalopram Oxalate] Other (See Comments)    Lack of therapeutic effect    Zoloft [Sertraline Hcl] Other (See Comments)    Lack of therapeutic effect     Antimicrobials this admission: 6/10 cefepime  >> 6/10 vanc >> 6/10 doxy >>  Dose adjustments this admission:   Microbiology results: 6/10 BCx:   6/10 Sputum:   6/10 MRSA PCR:   Thank you for allowing pharmacy to be a part of this patient's care.  Beau Bound RPh 02/07/2024, 10:27 PM

## 2024-02-07 NOTE — Subjective & Objective (Signed)
 Pt has had reccurent hospitalizations In m[place] was admitted for alcohol withdrawal Needing ICU stay and intubation he was discharged and readmitted within 1 day on 01/30/24 found to have PE Echo showed no RV strain  Was just discharge 2 days ago  Today he drank a few beers and ativan  His BP was in the 80's when he was stood up he got lightheaded  Reports fevers Cxr showing Right side opacity

## 2024-02-07 NOTE — Assessment & Plan Note (Signed)
 Continue eliquis  10 mg po q day

## 2024-02-07 NOTE — Assessment & Plan Note (Signed)
 Order CIWA protocol , monitor for withdrawal Transitional care coordinator consult

## 2024-02-07 NOTE — H&P (Addendum)
 Juan Clarke WJX:[id number] DOB: 01-Oct-1980 DOA: 02/07/2024     PCP: Perley Bradley, MD     Patient arrived to ER on 02/07/24 at 1917 Referred by Attending Deatra Face, MD   Patient coming from:    home Lives alone   Chief Complaint:   Chief Complaint  Patient presents with   Hypotension    HPI: Juan Clarke is a 43 y.o. male with medical history significant of PE, hx of alcohol abuse and DT's, anxiety, Hypothyrodism, HLD, Obesity, hx of DVT    Presented with  hypotension and presyncope, persistent fevers Pt has had reccurent hospitalizations In mAy was admitted for alcohol withdrawal Needing ICU stay and intubation he was discharged and readmitted within 1 day on 01/30/24 found to have PE Echo showed no RV strain  Was just discharge 2 days ago  Today he drank a few beers and ativan  His BP was in the 80's when he was stood up he got lightheaded  Reports fevers Cxr showing Right side opacity    HE TOOK ATIVAN  RIGHT BEFORE HE WENT IN TO THE TREATMENT CENTER  Has been taking his eliquis , no bleeding Still has intermittent CP similar to Last week  Denies significant ETOH intake  had a few beers  Reports has hx of DT's  Prior to today has not had anything to drink since May 20th  Prior to that he drank high EtOh malt liquor 2-3 20 oz per day  Does not smoke   Denies marijuana use      Regarding pertinent Chronic problems:    Hyperlipidemia - not on statins  Lipid Panel     Component Value Date/Time   CHOL 254 (H) 01/20/2024 0404   TRIG 278 (H) 01/28/2024 0724   HDL NOT REPORTED DUE TO HIGH TRIGLYCERIDES 01/20/2024 0404   CHOLHDL NOT REPORTED DUE TO HIGH TRIGLYCERIDES 01/20/2024 0404   VLDL UNABLE TO CALCULATE IF TRIGLYCERIDE OVER 400 mg/dL 21/30/8657 8469   LDLCALC NOT CALCULATED 01/20/2024 0404     HTN on Propranolol    chronic CHF diastolic - last echo  Recent Results (from the past 62952 hours)  ECHOCARDIOGRAM COMPLETE   Collection Time:  02/01/24  8:58 AM  Result Value   Weight 4,264.58   Height 72   BP 154/52   Single Plane A2C EF 72.0   Single Plane A4C EF 67.8   Calc EF 70.3   S' Lateral 2.30   AR max vel 2.50   AV Area VTI 2.84   AV Mean grad 5.0   AV Peak grad 9.2   Ao pk vel 1.52   Area-P 1/2 4.71   AV Area mean vel 2.38   Est EF 70 - 75%   Narrative      ECHOCARDIOGRAM REPORT       1. Left ventricular ejection fraction, by estimation, is 70 to 75%. The left ventricle has hyperdynamic function. The left ventricle has no regional wall motion abnormalities. There is moderate concentric left ventricular hypertrophy. Left ventricular  diastolic parameters are indeterminate.  2. Right ventricular systolic function is normal. The right ventricular size is normal. Tricuspid regurgitation signal is inadequate for assessing PA pressure.  3. The mitral valve is normal in structure. Trivial mitral valve regurgitation. No evidence of mitral stenosis.  4. The aortic valve is normal in structure. Aortic valve regurgitation is not visualized. No aortic stenosis is present.  5. The inferior vena cava is dilated in size with >50% respiratory variability, suggesting  right atrial pressure of 8 mmHg.      Final              Hypothyroidism:   Lab Results  Component Value Date   TSH 2.162 01/17/2024   on synthroid     obesity-   BMI Readings from Last 1 Encounters:  02/02/24 35.82 kg/m      Hx of DVT/PE on - anticoagulation with  Eliquis ,    Chronic anemia - baseline hg Hemoglobin & Hematocrit  Recent Labs    02/01/24 0105 02/03/24 0805 02/07/24 1933  HGB 11.2* 10.2* 10.8*   Iron/TIBC/Ferritin/ %Sat    Component Value Date/Time   FERRITIN 396 (H) 01/26/2024 1648      While in ER:    CXR found possible PNA     Lab Orders         Resp panel by RT-PCR (RSV, Flu A&B, Covid) Anterior Nasal Swab         Blood Culture (routine x 2)         Basic metabolic panel         CBC         Brain natriuretic  peptide         Lactic acid, plasma         Protime-INR         I-Stat Lactic Acid, ED        CXR - . Confluent opacity in the right lower lobe, suspicious for pneumonia.    Following Medications were ordered in ER: Medications  oxyCODONE  (Oxy IR/ROXICODONE ) immediate release tablet 5 mg (has no administration in time range)  LORazepam  (ATIVAN ) injection 0-4 mg (has no administration in time range)    Or  LORazepam  (ATIVAN ) tablet 0-4 mg (has no administration in time range)  LORazepam  (ATIVAN ) injection 0-4 mg (has no administration in time range)    Or  LORazepam  (ATIVAN ) tablet 0-4 mg (has no administration in time range)  thiamine  (VITAMIN B1) tablet 100 mg (has no administration in time range)    Or  thiamine  (VITAMIN B1) injection 100 mg (has no administration in time range)  lactated ringers  bolus 1,000 mL (has no administration in time range)  vancomycin  (VANCOCIN ) IVPB 1000 mg/200 mL premix (has no administration in time range)  ceFEPIme  (MAXIPIME ) 2 g in sodium chloride  0.9 % 100 mL IVPB (has no administration in time range)        ED Triage Vitals  Encounter Vitals Group     BP 02/07/24 1931 (!) 107/53     Systolic BP Percentile --      Diastolic BP Percentile --      Pulse Rate 02/07/24 1931 (!) 109     Resp 02/07/24 1931 18     Temp 02/07/24 1931 98.1 F (36.7 C)     Temp Source 02/07/24 1931 Oral     SpO2 02/07/24 1931 96 %     Weight --      Height --      Head Circumference --      Peak Flow --      Pain Score 02/07/24 1932 6     Pain Loc --      Pain Education --      Exclude from Growth Chart --   WUJW(11)@     _________________________________________ Significant initial  Findings: Abnormal Labs Reviewed  BASIC METABOLIC PANEL WITH GFR - Abnormal; Notable for the following components:      Result Value  Sodium 131 (*)    CO2 19 (*)    All other components within normal limits  CBC - Abnormal; Notable for the following components:   RBC  3.71 (*)    Hemoglobin 10.8 (*)    HCT 33.9 (*)    Platelets 614 (*)    All other components within normal limits      _________________________ Troponin  ordered Cardiac Panel (last 3 results) Recent Labs    02/07/24 1933  TROPONINIHS 4     ECG: Ordered Personally reviewed and interpreted by me showing: HR : 108 Rhythm:Sinus tachycardia [id number] No significant change since last tracing QTC 467   ____________________ This patient meets SIRS Criteria and may be septic   The recent clinical data is shown below. Vitals:   02/07/24 1931  BP: (!) 107/53  Pulse: (!) 109  Resp: 18  Temp: 98.1 F (36.7 C)  TempSrc: Oral  SpO2: 96%      WBC     Component Value Date/Time   WBC 10.5 02/07/2024 1933   LYMPHSABS 1.5 02/03/2024 0805   MONOABS 1.1 (H) 02/03/2024 0805   EOSABS 0.1 02/03/2024 0805   BASOSABS 0.1 02/03/2024 0805     Lactic Acid, Venous    Component Value Date/Time   LATICACIDVEN 0.8 01/31/2024 0406    Procalcitonin   Ordered      UA   ordered     Results for orders placed or performed during the hospital encounter of 01/30/24  Blood Culture (routine x 2)     Status: None   Collection Time: 01/31/24  3:46 AM   Specimen: BLOOD  Result Value Ref Range Status   Specimen Description   Final    BLOOD BLOOD RIGHT FOREARM Performed at [facility], [address] [place], [place] [address]    Special Requests   Final    BOTTLES DRAWN AEROBIC AND ANAEROBIC Blood Culture adequate volume Performed at [facility], [address], [place], [place] [address]    Culture   Final    NO GROWTH 5 DAYS Performed at [facility], [address], [place], [place] [address]    Report Status 02/05/2024 FINAL  Final  Resp panel by RT-PCR (RSV, Flu A&B, Covid)     Status: None   Collection Time: 01/31/24  4:15 AM   Specimen: Nasal Swab  Result Value Ref Range Status   SARS Coronavirus 2 by RT PCR NEGATIVE NEGATIVE  Final         Influenza A by PCR NEGATIVE NEGATIVE Final   Influenza B by PCR NEGATIVE NEGATIVE Final         Resp Syncytial Virus by PCR NEGATIVE NEGATIVE Final        Blood Culture (routine x 2)     Status: None   Collection Time: 01/31/24  4:10 PM   Specimen: BLOOD RIGHT HAND  Result Value Ref Range Status   Specimen Description   Final    BLOOD RIGHT HAND Performed at [facility], [address] [place], [place] [address]    Special Requests   Final    BOTTLES DRAWN AEROBIC AND ANAEROBIC Blood Culture adequate volume Performed at [facility], [address] [place], [place] [address]    Culture   Final    NO GROWTH 5 DAYS Performed at [facility], [address], [place], [place] [address]    Report Status 02/05/2024 FINAL  Final  ABX started cefepime . Vanc doxy   ______________________________________________________________VBG pending   __________________________________________________________ Recent Labs  Lab 02/01/24 0105 02/02/24 0507 02/03/24 0805 02/07/24 1933  NA 131* 129* 132* 131*  K 4.4 4.2 4.0 4.2  CO2 22 23 22  19*  GLUCOSE 94 111* 97 92  BUN 14 6 16 15   CREATININE 0.89 <0.30* 0.93 1.08  CALCIUM  8.6* 8.7* 8.6* 8.9  MG  --  2.6*  --   --   PHOS  --  3.0  --   --     Cr   Up from baseline see below Lab Results  Component Value Date   CREATININE 1.08 02/07/2024   CREATININE 0.93 02/03/2024   CREATININE <0.30 (L) 02/02/2024    Recent Labs  Lab 02/01/24 0105 02/02/24 0507 02/03/24 0805  AST 75* 46* 68*  ALT 172* 105* 100*  ALKPHOS 247* 198* 231*  BILITOT 1.2 1.3* 1.2  PROT 7.1 6.6 7.4  ALBUMIN 3.3* 2.8* 3.0*   Lab Results  Component Value Date   CALCIUM  8.9 02/07/2024   PHOS 3.0 02/02/2024       Plt: Lab Results  Component Value Date   PLT 614 (H) 02/07/2024         Recent Labs  Lab 02/01/24 0105 02/03/24 0805 02/07/24 1933  WBC 12.7* 11.4* 10.5  NEUTROABS  --  8.4*  --   HGB  11.2* 10.2* 10.8*  HCT 35.1* 32.6* 33.9*  MCV 93.9 94.2 91.4  PLT 279 348 614*    HG/HCT  stable,      Component Value Date/Time   HGB 10.8 (L) 02/07/2024 1933   HCT 33.9 (L) 02/07/2024 1933   MCV 91.4 02/07/2024 1933     _______________________________________________ Hospitalist was called for admission for  SEPSIS, HCAP   The following Work up has been ordered so far:  Orders Placed This Encounter  Procedures   Resp panel by RT-PCR (RSV, Flu A&B, Covid) Anterior Nasal Swab   Blood Culture (routine x 2)   DG Chest 2 View   Basic metabolic panel   CBC   Brain natriuretic peptide   Lactic acid, plasma   Protime-INR   Diet NPO time specified   Document Height and Actual Weight   Clinical institute withdrawal assessment every 6 hours X 48 hours, then every 12 hours x 48 hours   Notify MD if CIWA-AR is greater than 10 for 2 consecutive assessments.   Notify MD if CIWA-AR score > 10 point increase (consider critical care consult).   Notify MD if patient has a seizure.   May administer Ativan  PO versus IV if patient is tolerating PO intake well   Vital signs every 6 hours X 48 hours, then per unit protocol   Refer to Sidebar Report CIWA protocol   If Ativan  given, reassess Clinical Institute Withdrawal Assessment (CIWA) with blood pressure and pulse rate within 1 hour of Ativan  administration   Document height and weight   Assess and Document Glasgow Coma Scale   Document vital signs within 1-hour of fluid bolus completion. Notify provider of abnormal vital signs despite fluid resuscitation.   DO NOT delay antibiotics if unable to obtain blood culture.   Refer to Sidebar Report: Sepsis Sidebar ED/IP   Notify provider for difficulties obtaining IV access.   Insert peripheral IV x 2   Initiate Carrier Fluid Protocol   Code Sepsis activation.  This occurs automatically when order is signed and prioritizes pharmacy, lab, and radiology services for STAT collections and  interventions.  If [facility] downtime, call [facility] [phone]) to activate Code Sepsis.   monitoring by pharmacy   Consult to hospitalist Consult Timeframe: STAT - requires a response within one hour; STAT timeframe requires provider to provider communication, has the provider to provider communication been completed: Yes; Reason for Consult? admission   I-Stat Lactic Acid, ED   ED EKG   EKG 12-Lead     OTHER Significant initial  Findings:  labs showing:     DM  labs:  HbA1C: Recent Labs    01/18/24 0333  HGBA1C 5.4       CBG (last 3)  No results for input(s): "GLUCAP" in the last 72 hours.        Cultures:    Component Value Date/Time   SDES  01/31/2024 1610    BLOOD RIGHT HAND Performed at [facility], [address], [place], [place] [address]    SPECREQUEST  01/31/2024 1610    BOTTLES DRAWN AEROBIC AND ANAEROBIC Blood Culture adequate volume Performed at [facility], [address], [place], [place] [address]    CULT  01/31/2024 1610    NO GROWTH 5 DAYS Performed at [facility], [address] [place], [place] [address]    REPTSTATUS 02/05/2024 FINAL 01/31/2024 1610     Radiological Exams on Admission: DG Chest 2 View Result Date: 02/07/2024 CLINICAL DATA:  Chest pain and near syncope EXAM: CHEST - 2 VIEW COMPARISON:  Chest radiograph dated 01/30/2024 FINDINGS: Normal lung volumes. Confluent opacity in the right lower lobe. Blunting of the right costophrenic angle. No pneumothorax. The heart size and mediastinal contours are within normal limits. No acute osseous abnormality. IMPRESSION: 1. Confluent opacity in the right lower lobe, suspicious for pneumonia. 2. Blunting of the right costophrenic angle, which may represent a small pleural effusion. Electronically Signed   By: Limin  Xu M.D.   On: 02/07/2024 20:27   _______________________________________________________________________________________________________ Latest  Blood  pressure (!) 107/53, pulse (!) 109, temperature 98.1 F (36.7 C), temperature source Oral, resp. rate 18, SpO2 96%.   Vitals  labs and radiology finding personally reviewed  Review of Systems:    Pertinent positives include:   fatigue, presyncope  Constitutional:  No weight loss, night sweats, Fevers, chills, weight loss  HEENT:  No headaches, Difficulty swallowing,Tooth/dental problems,Sore throat,  No sneezing, itching, ear ache, nasal congestion, post nasal drip,  Cardio-vascular:  No chest pain, Orthopnea, PND, anasarca, dizziness, palpitations.no Bilateral lower extremity swelling  GI:  No heartburn, indigestion, abdominal pain, nausea, vomiting, diarrhea, change in bowel habits, loss of appetite, melena, blood in stool, hematemesis Resp:  no shortness of breath at rest. No dyspnea on exertion, No excess mucus, no productive cough, No non-productive cough, No coughing up of blood.No change in color of mucus.No wheezing. Skin:  no rash or lesions. No jaundice GU:  no dysuria, change in color of urine, no urgency or frequency. No straining to urinate.  No flank pain.  Musculoskeletal:  No joint pain or no joint swelling. No decreased range of motion. No back pain.  Psych:  No change in mood or affect. No depression or anxiety. No memory loss.  Neuro: no localizing neurological complaints, no tingling, no weakness, no double vision, no gait abnormality, no slurred speech, no confusion  All systems reviewed and apart from HOPI all are negative _______________________________________________________________________________________________ Past Medical History:   Past Medical History:  Diagnosis Date   GAD (generalized anxiety disorder)    HTN (hypertension)  Insomnia    Migraine without aura    Overweight    Trigeminal neuralgia       Past Surgical History:  Procedure Laterality Date   Face surgery  11/14/14   TONSILLECTOMY     43 yr old    Social  History:  Ambulatory   independently       reports that he has never smoked. He has never used smokeless tobacco. He reports current alcohol use of about 42.0 standard drinks of alcohol per week. He reports current drug use. Frequency: 7.00 times per week. Drug: Marijuana.   Family History:   Family History  Problem Relation Age of Onset   Migraines Mother    Hypertension Father    Mental illness Sister    Drug abuse Sister    Brain cancer Maternal Grandmother    Heart disease Paternal Grandfather    ______________________________________________________________________________________________ Allergies: Allergies  Allergen Reactions   Lexapro [Escitalopram Oxalate] Other (See Comments)    Lack of therapeutic effect    Zoloft [Sertraline Hcl] Other (See Comments)    Lack of therapeutic effect      Prior to Admission medications   Medication Sig Start Date End Date Taking? Authorizing Provider  acetaminophen  (TYLENOL ) 325 MG tablet Take 325-650 mg by mouth every 6 (six) hours as needed for mild pain (pain score 1-3) or headache.    [provider]  allopurinol  (ZYLOPRIM ) 300 MG tablet Take 300 mg by mouth daily.    [provider]  APIXABAN  (ELIQUIS ) VTE STARTER PACK (10MG  AND 5MG ) Take as directed on package: start with two-5mg  tablets twice daily for 7 days. On day 8, switch to one-5mg  tablet twice daily. 02/04/24   Vada Garibaldi, MD  diphenhydrAMINE  (BENADRYL ) 25 mg capsule Take 25 mg by mouth every 6 (six) hours as needed for itching or allergies.    [provider]  DULoxetine  (CYMBALTA ) 60 MG capsule Take 120 mg by mouth daily.    [provider]  levothyroxine  (SYNTHROID ) 75 MCG tablet Take 75 mcg by mouth daily before breakfast. 12/21/23   [provider]  methocarbamol  (ROBAXIN ) 500 MG tablet Take 1 tablet (500 mg total) by mouth 4 (four) times daily for 7 days. 02/04/24 02/11/24  Vada Garibaldi, MD  oxyCODONE  (ROXICODONE ) 5 MG  immediate release tablet Take 1 tablet (5 mg total) by mouth every 6 (six) hours as needed. 02/04/24 02/03/25  Vada Garibaldi, MD  PEPCID  COMPLETE [phone] MG chewable tablet Chew 1 tablet by mouth daily as needed (for heartburn or reflux).    [provider]  propranolol  (INDERAL ) 20 MG tablet Take 20 mg by mouth 3 (three) times daily as needed (Anxiety).    [provider]  traZODone  (DESYREL ) 50 MG tablet Take 50 mg by mouth at bedtime as needed for sleep.    [provider]    ___________________________________________________________________________________________________ Physical Exam:    02/07/2024    7:31 PM 02/04/2024    4:36 AM 02/03/2024    8:19 PM  Vitals with BMI  Systolic 107 133 161  Diastolic 53 70 74  Pulse 109 88 93     1. General:  in No  Acute distress    Chronically ill   -appearing 2. Psychological: Alert and   Oriented 3. Head/ENT:    Dry Mucous Membranes                          Head Non traumatic, neck supple  Norma  Dentition 4. SKIN:  decreased Skin turgor,  Skin clean Dry and intact no rash    5. Heart: Regular rate and rhythm no  Murmur, no Rub or gallop 6. Lungs:  no wheezes or crackles   7. Abdomen: Soft,  non-tender, Non distended   obese  bowel sounds present 8. Lower extremities: no clubbing, cyanosis, no  edema 9. Neurologically Grossly intact, moving all 4 extremities equally  no tremor no astrexis 10. MSK: Normal range of motion    Chart has been reviewed  ______________________________________________________________________________________________  Assessment/Plan 43 y.o. male with medical history significant of PE, hx of alcohol abuse and DT's, anxiety, Hypothyrodism, HLD, Obesity, hx of DVT  Admitted for  Sepsis hypotension, HCAP   Present on Admission:  HCAP (healthcare-associated pneumonia)  Acute bilateral deep vein thrombosis (DVT) of popliteal veins (HCC)  Alcoholism (HCC)   Class 3 obesity  Elevated liver enzymes  Essential hypertension  Pulmonary embolism (HCC)  Severe sepsis (HCC)  Hyponatremia    Acquired hypothyroidism - Check TSH continue home medications Synthroid  at 75 mcg po q day   Acute bilateral deep vein thrombosis (DVT) of popliteal veins (HCC) Continue eliquis  10 mg po BID  Alcoholism (HCC) Order CIWA protocol , monitor for withdrawal Transitional care coordinator consult  HCAP (healthcare-associated pneumonia)  - -Patient presenting with  productive cough, fever   and infiltrate in   lower lobe on chest x-ray -Infiltrate on CXR and 2-3 characteristics (fever, leukocytosis, purulent sputum) are consistent with pneumonia. -This appears to be most likely community-acquired pneumonia.       will admit for treatment of HCAP will start on appropriate antibiotic coverage. -Cefepime  and bank and add doxycycline    Obtain:  sputum cultures,                 blood cultures and sputum cultures ordered                   strep pneumo UA antigen,                  check for Legionella antigen.                Provide oxygen as needed.     Class 3 obesity Contributing to comorbidity and complicating medical management     Nutritional follow up as an out pt would be recommended   Elevated liver enzymes In the setting of EToH abuse Spoke to pt regarding continuing to avoid [facility]  Essential hypertension Given hypotension hold propranolol    Pulmonary embolism (HCC) Continue eliquis  10 mg po q day  Hyponatremia In the setting of etoH abuse  Obtain urine electrolytes Given hypotension will rehydrate    Other plan as per orders.  DVT prophylaxis:  eliquis     Code Status:    Code Status: Prior FULL CODE  as per patient   I had personally discussed CODE STATUS with patient   ACP   none   Family Communication:   Family not at  Bedside    Diet  Diet Orders (From admission, onward)     Start     Ordered   02/07/24 2114  Diet  Heart Room service appropriate? Yes; Fluid consistency: Thin  Diet effective now       Question Answer Comment  Room service appropriate? Yes   Fluid consistency: Thin      02/07/24 2116            Disposition Plan:  To home once workup is complete and patient is stable   Following barriers for discharge:                                                    Electrolytes corrected                               Anemia stable                                                             Afebrile,  able to transition to PO antibiotics                             Will need to be able to tolerate PO                                    Consult Orders  (From admission, onward)           Start     Ordered   02/07/24 2053  Consult to hospitalist Consult Timeframe: STAT - requires a response within one hour; STAT timeframe requires provider to provider communication, has the provider to provider communication been completed: Yes; Reason for Consult? admission  (Septic presentation on arrival (screening labs, nursing and treatment orders for obvious sepsis))  Once       Provider:  (Not yet assigned)  Question Answer Comment  Consult Timeframe STAT - requires a response within one hour   STAT timeframe requires provider to provider communication, has the provider to provider communication been completed Yes   Reason for Consult? admission      02/07/24 2052                              Transition of care consulted                    Consults called:    NONE   Admission status:  ED Disposition     ED Disposition  Admit   Condition  --   Comment  Hospital Area: [facility] [[id number]]  Level of Care: Progressive [102]  Admit to Progressive based on following criteria: CARDIOVASCULAR & THORACIC of moderate stability with acute coronary syndrome symptoms/low risk myocardial infarction/hypertensive urgency/arrhythmias/heart failure potentially compromising  stability and stable post cardiovascular intervention patients.  May place patient in observation at [facility] or [facility] if equivalent level of care is available:: No  Covid Evaluation: Asymptomatic - no recent exposure (last 10 days) testing not required  Diagnosis: CAP (community acquired pneumonia) [604540]  Admitting Physician: Arwyn Besaw [[id number]]  Attending Physician: Tae Robak [[id number]]           Obs     Level of care     progressive      Critical   Patient is critically ill due to  hemodynamic instability severe sepsis  They are  at high risk for life/limb threatening clinical deterioration requiring frequent reassessment and modifications of care.  Services provided include examination of the patient, review of relevant ancillary tests, prescription of lifesaving therapies, review of medications and prophylactic therapy.  Total critical care time excluding separately billable procedures: 60   Minutes.  Nahomi Hegner 02/07/2024, 10:03 PM    [facility]     after 2 AM please page floor coverage   If 7AM-7PM, please contact the day team taking care of the patient using [email or web address]

## 2024-02-07 NOTE — Assessment & Plan Note (Signed)
 -  Check TSH continue home medications Synthroid at po q day

## 2024-02-07 NOTE — Assessment & Plan Note (Signed)
 In the setting of etoH abuse  Obtain urine electrolytes Given hypotension will rehydrate

## 2024-02-07 NOTE — Assessment & Plan Note (Signed)
 Given hypotension hold propranolol 

## 2024-02-07 NOTE — ED Triage Notes (Signed)
 Pt comes via [facility] from [facility], was going threw intake and had a near syncopal episode, was hypotensive. Pt was here for ETOH withdraw and in ICU, discharge on Sat, drank 2 beers today and took ativan  today prior to going to [facility]. Pressures 80's systolic with EMS

## 2024-02-07 NOTE — Assessment & Plan Note (Signed)
 Contributing to comorbidity and complicating medical management     Nutritional follow up as an out pt would be recommended

## 2024-02-07 NOTE — Assessment & Plan Note (Signed)
 In the setting of EToH abuse Spoke to pt regarding continuing to avoid [facility]

## 2024-02-07 NOTE — Assessment & Plan Note (Signed)
 Continue eliquis  10 mg po BID

## 2024-02-07 NOTE — ED Provider Notes (Addendum)
 [place] EMERGENCY DEPARTMENT AT [facility] Provider Note   CSN: [id number] Arrival date & time: 02/07/24  9629     History  Chief Complaint  Patient presents with   Hypotension    Juan Clarke is a 43 y.o. male.  HPI    43 year old male comes in with chief complaint of low blood pressure.  Patient has history of alcohol use disorder.  He was discharged from the hospital on 6-2 after he had severe alcohol withdrawals that required intubation and complication of serotonin syndrome.  Patient returned to the ER on 6-3 with chest pain and shortness of breath.  He was found to have PE, with mild right-sided heart strain and discharged from the hospital on 6-7.  Patient states that he went to hotel.  He has been taking his medications.  Today he checked himself in at a rehab facility.  When he got there, he was found to have low blood pressure and the EMS was called.  Patient states that he did feel dizzy while he was being checked,, particularly when he stood up.  Patient states in the last 2 days, after being discharged, he is still has some chest discomfort.  Chest discomfort is worse with deep inspiration.  He has a cough, subjective fevers.  He thinks that he had a fever while in the hospital as well.  Cough is not producing any phlegm.  Chest pain is pleuritic in nature, similar to PE.  He has been eating and drinking well.  Today he did drink 2 large beers before he checked into the rehab facility.  He also took oral Ativan  prior to checking in.   Home Medications Prior to Admission medications   Medication Sig Start Date End Date Taking? Authorizing Provider  acetaminophen  (TYLENOL ) 325 MG tablet Take 325-650 mg by mouth every 6 (six) hours as needed for mild pain (pain score 1-3) or headache.    [provider]  allopurinol  (ZYLOPRIM ) 300 MG tablet Take 300 mg by mouth daily.    [provider]  APIXABAN  (ELIQUIS ) VTE STARTER PACK (10MG  AND 5MG )  Take as directed on package: start with two-5mg  tablets twice daily for 7 days. On day 8, switch to one-5mg  tablet twice daily. 02/04/24   Vada Garibaldi, MD  diphenhydrAMINE  (BENADRYL ) 25 mg capsule Take 25 mg by mouth every 6 (six) hours as needed for itching or allergies.    [provider]  DULoxetine  (CYMBALTA ) 60 MG capsule Take 120 mg by mouth daily.    [provider]  levothyroxine  (SYNTHROID ) 75 MCG tablet Take 75 mcg by mouth daily before breakfast. 12/21/23   [provider]  methocarbamol  (ROBAXIN ) 500 MG tablet Take 1 tablet (500 mg total) by mouth 4 (four) times daily for 7 days. 02/04/24 02/11/24  Vada Garibaldi, MD  oxyCODONE  (ROXICODONE ) 5 MG immediate release tablet Take 1 tablet (5 mg total) by mouth every 6 (six) hours as needed. 02/04/24 02/03/25  Vada Garibaldi, MD  PEPCID  COMPLETE [phone] MG chewable tablet Chew 1 tablet by mouth daily as needed (for heartburn or reflux).    [provider]  propranolol  (INDERAL ) 20 MG tablet Take 20 mg by mouth 3 (three) times daily as needed (Anxiety).    [provider]  traZODone  (DESYREL ) 50 MG tablet Take 50 mg by mouth at bedtime as needed for sleep.    [provider]      Allergies    Lexapro [escitalopram oxalate] and Zoloft [sertraline  hcl]    Review of Systems   Review of Systems  All other systems reviewed and are negative.   Physical Exam Updated Vital Signs BP (!) 107/53   Pulse (!) 109   Temp 98.1 F (36.7 C) (Oral)   Resp 18   SpO2 96%  Physical Exam Vitals and nursing note reviewed.  Constitutional:      Appearance: He is well-developed.  HENT:     Head: Atraumatic.  Cardiovascular:     Rate and Rhythm: Tachycardia present.  Pulmonary:     Effort: Pulmonary effort is normal.     Breath sounds: No wheezing or rhonchi.  Musculoskeletal:     Cervical back: Neck supple.  Skin:    General: Skin is warm.  Neurological:     Mental Status: He is alert and  oriented to person, place, and time.     ED Results / Procedures / Treatments   Labs (all labs ordered are listed, but only abnormal results are displayed) Labs Reviewed  BASIC METABOLIC PANEL WITH GFR - Abnormal; Notable for the following components:      Result Value   Sodium 131 (*)    CO2 19 (*)    All other components within normal limits  CBC - Abnormal; Notable for the following components:   RBC 3.71 (*)    Hemoglobin 10.8 (*)    HCT 33.9 (*)    Platelets 614 (*)    All other components within normal limits  RESP PANEL BY RT-PCR (RSV, FLU A&B, COVID)  RVPGX2  CULTURE, BLOOD (ROUTINE X 2)  CULTURE, BLOOD (ROUTINE X 2)  LACTIC ACID, PLASMA  BRAIN NATRIURETIC PEPTIDE  PROTIME-INR  I-STAT CG4 LACTIC ACID, ED  TROPONIN I (HIGH SENSITIVITY)  TROPONIN I (HIGH SENSITIVITY)    EKG EKG Interpretation Date/Time:  Tuesday February 07 2024 19:37:49 EDT Ventricular Rate:  108 PR Interval:  131 QRS Duration:  98 QT Interval:  348 QTC Calculation: 467 R Axis:   78  Text Interpretation: Sinus tachycardia [id number] No significant change since last tracing Confirmed by Deatra Face ([id number]) on 02/07/2024 7:56:51 PM  Radiology DG Chest 2 View Result Date: 02/07/2024 CLINICAL DATA:  Chest pain and near syncope EXAM: CHEST - 2 VIEW COMPARISON:  Chest radiograph dated 01/30/2024 FINDINGS: Normal lung volumes. Confluent opacity in the right lower lobe. Blunting of the right costophrenic angle. No pneumothorax. The heart size and mediastinal contours are within normal limits. No acute osseous abnormality. IMPRESSION: 1. Confluent opacity in the right lower lobe, suspicious for pneumonia. 2. Blunting of the right costophrenic angle, which may represent a small pleural effusion. Electronically Signed   By: Limin  Xu M.D.   On: 02/07/2024 20:27    Procedures .Critical Care  Performed by: Deatra Face, MD Authorized by: Deatra Face, MD   Critical care provider statement:    Critical  care time (minutes):  38   Critical care was necessary to treat or prevent imminent or life-threatening deterioration of the following conditions:  Sepsis   Critical care was time spent personally by me on the following activities:  Development of treatment plan with patient or surrogate, discussions with consultants, evaluation of patient's response to treatment, examination of patient, ordering and review of laboratory studies, ordering and review of radiographic studies, ordering and performing treatments and interventions, pulse oximetry, re-evaluation of patient's condition, review of old charts and obtaining history from patient or surrogate     Medications Ordered in ED Medications  oxyCODONE  (Oxy IR/ROXICODONE )  immediate release tablet 5 mg (has no administration in time range)  LORazepam  (ATIVAN ) injection 0-4 mg (has no administration in time range)    Or  LORazepam  (ATIVAN ) tablet 0-4 mg (has no administration in time range)  LORazepam  (ATIVAN ) injection 0-4 mg (has no administration in time range)    Or  LORazepam  (ATIVAN ) tablet 0-4 mg (has no administration in time range)  thiamine  (VITAMIN B1) tablet 100 mg (has no administration in time range)    Or  thiamine  (VITAMIN B1) injection 100 mg (has no administration in time range)  lactated ringers  bolus 1,000 mL (has no administration in time range)  vancomycin  (VANCOCIN ) IVPB 1000 mg/200 mL premix (has no administration in time range)  ceFEPIme  (MAXIPIME ) 2 g in sodium chloride  0.9 % 100 mL IVPB (has no administration in time range)    ED Course/ Medical Decision Making/ A&P                                 Medical Decision Making Amount and/or Complexity of Data Reviewed Labs: ordered. Radiology: ordered.  Risk OTC drugs. Prescription drug management. Decision regarding hospitalization.   43 year old male with history of alcohol use disorder, generalized anxiety disorder and recent admissions to the hospital -first  for alcohol withdrawals that required intubation and ICU admission and second for pulmonary embolism.  He was just discharged yesterday.  While going to the rehab facility, he was found to have low blood pressure.  Thereafter patient felt like he was going to faint.  I reviewed patient's discharge summary on 6-2 and again from yesterday.  Differential diagnosis for this patient includes transiting clot leading to worsening PE, right-sided heart strain, RV failure and shock, dehydration, side effects from alcohol and Ativan  intake.  Initial plan is to get basic labs.  Will give patient some IV fluid.  He is noted to be tachycardic with shock index about 1 right now.  It appears that EMS noted that patient's systolic blood pressure was in the 80s.  Reassessment: I have reviewed patient's chest x-ray.  There is clear right-sided opacity. Patient's lung exam remains clear.  He states that he does have chest pain, primarily on the right side when he takes deep breaths.  Symptoms similar to his PE.  He has a cough, but no phlegm.  He is having fevers subjectively.  Labs are overall reassuring.  Normal white count.  Normal troponin.  I have now concerns that patient might be developing hospital-acquired pneumonia. In the setting of him having shock index of 1 or greater, known PE, questionable pneumonia -best to admitted to the hospital.  IV fluids ordered.  Code sepsis activated. Clinically patient is not in shock.  We will not give him 30 cc/kg, as I think it will be more detrimental for the patient.  He has not had any persistent hypotension in the ER.   Final Clinical Impression(s) / ED Diagnoses Final diagnoses:  HAP (hospital-acquired pneumonia)    Rx / DC Orders ED Discharge Orders     None          Deatra Face, MD 02/07/24 2145

## 2024-02-07 NOTE — Assessment & Plan Note (Signed)
- -  Patient presenting with  productive cough, fever   and infiltrate in   lower lobe on chest x-ray -Infiltrate on CXR and 2-3 characteristics (fever, leukocytosis, purulent sputum) are consistent with pneumonia. -This appears to be most likely community-acquired pneumonia.       will admit for treatment of HCAP will start on appropriate antibiotic coverage. -Cefepime  and bank and add doxycycline    Obtain:  sputum cultures,                 blood cultures and sputum cultures ordered                   strep pneumo UA antigen,                  check for Legionella antigen.                Provide oxygen as needed.

## 2024-02-08 ENCOUNTER — Other Ambulatory Visit: Payer: Self-pay

## 2024-02-08 ENCOUNTER — Observation Stay (HOSPITAL_COMMUNITY)

## 2024-02-08 DIAGNOSIS — R748 Abnormal levels of other serum enzymes: Secondary | ICD-10-CM | POA: Diagnosis not present

## 2024-02-08 DIAGNOSIS — R6510 Systemic inflammatory response syndrome (SIRS) of non-infectious origin without acute organ dysfunction: Secondary | ICD-10-CM

## 2024-02-08 DIAGNOSIS — I82433 Acute embolism and thrombosis of popliteal vein, bilateral: Secondary | ICD-10-CM | POA: Diagnosis not present

## 2024-02-08 DIAGNOSIS — J189 Pneumonia, unspecified organism: Secondary | ICD-10-CM | POA: Diagnosis present

## 2024-02-08 DIAGNOSIS — F1020 Alcohol dependence, uncomplicated: Secondary | ICD-10-CM | POA: Diagnosis not present

## 2024-02-08 DIAGNOSIS — E871 Hypo-osmolality and hyponatremia: Secondary | ICD-10-CM

## 2024-02-08 LAB — PHOSPHORUS: Phosphorus: 4.5 mg/dL (ref 2.5–4.6)

## 2024-02-08 LAB — CBC WITH DIFFERENTIAL/PLATELET
Abs Immature Granulocytes: 0.41 10*3/uL — ABNORMAL HIGH (ref 0.00–0.07)
Basophils Absolute: 0.2 10*3/uL — ABNORMAL HIGH (ref 0.0–0.1)
Basophils Relative: 1 %
Eosinophils Absolute: 0.6 10*3/uL — ABNORMAL HIGH (ref 0.0–0.5)
Eosinophils Relative: 6 %
HCT: 33.5 % — ABNORMAL LOW (ref 39.0–52.0)
Hemoglobin: 10.9 g/dL — ABNORMAL LOW (ref 13.0–17.0)
Immature Granulocytes: 4 %
Lymphocytes Relative: 21 %
Lymphs Abs: 2.2 10*3/uL (ref 0.7–4.0)
MCH: 29.6 pg (ref 26.0–34.0)
MCHC: 32.5 g/dL (ref 30.0–36.0)
MCV: 91 fL (ref 80.0–100.0)
Monocytes Absolute: 0.9 10*3/uL (ref 0.1–1.0)
Monocytes Relative: 9 %
Neutro Abs: 6.2 10*3/uL (ref 1.7–7.7)
Neutrophils Relative %: 59 %
Platelets: 604 10*3/uL — ABNORMAL HIGH (ref 150–400)
RBC: 3.68 MIL/uL — ABNORMAL LOW (ref 4.22–5.81)
RDW: 13.2 % (ref 11.5–15.5)
WBC: 10.4 10*3/uL (ref 4.0–10.5)
nRBC: 0 % (ref 0.0–0.2)

## 2024-02-08 LAB — COMPREHENSIVE METABOLIC PANEL WITH GFR
ALT: 117 U/L — ABNORMAL HIGH (ref 0–44)
AST: 43 U/L — ABNORMAL HIGH (ref 15–41)
Albumin: 3 g/dL — ABNORMAL LOW (ref 3.5–5.0)
Alkaline Phosphatase: 254 U/L — ABNORMAL HIGH (ref 38–126)
Anion gap: 11 (ref 5–15)
BUN: 14 mg/dL (ref 6–20)
CO2: 21 mmol/L — ABNORMAL LOW (ref 22–32)
Calcium: 9 mg/dL (ref 8.9–10.3)
Chloride: 102 mmol/L (ref 98–111)
Creatinine, Ser: 0.82 mg/dL (ref 0.61–1.24)
GFR, Estimated: >60 mL/min (ref >60)
Glucose, Bld: 97 mg/dL (ref 70–99)
Potassium: 3.6 mmol/L (ref 3.5–5.1)
Sodium: 134 mmol/L — ABNORMAL LOW (ref 135–145)
Total Bilirubin: 0.6 mg/dL (ref 0.0–1.2)
Total Protein: 6.7 g/dL (ref 6.5–8.1)

## 2024-02-08 LAB — URINALYSIS, COMPLETE (UACMP) WITH MICROSCOPIC
Bacteria, UA: NONE SEEN
Bilirubin Urine: NEGATIVE
Glucose, UA: NEGATIVE mg/dL
Hgb urine dipstick: NEGATIVE
Ketones, ur: NEGATIVE mg/dL
Leukocytes,Ua: NEGATIVE
Nitrite: NEGATIVE
Protein, ur: NEGATIVE mg/dL
Specific Gravity, Urine: 1.01 (ref 1.005–1.030)
pH: 6 (ref 5.0–8.0)

## 2024-02-08 LAB — PREALBUMIN: Prealbumin: 20 mg/dL (ref 18–38)

## 2024-02-08 LAB — OSMOLALITY, URINE: Osmolality, Ur: 349 mosm/kg (ref 300–900)

## 2024-02-08 LAB — CBC
HCT: 34.5 % — ABNORMAL LOW (ref 39.0–52.0)
Hemoglobin: 11.4 g/dL — ABNORMAL LOW (ref 13.0–17.0)
MCH: 29.7 pg (ref 26.0–34.0)
MCHC: 33 g/dL (ref 30.0–36.0)
MCV: 89.8 fL (ref 80.0–100.0)
Platelets: 556 10*3/uL — ABNORMAL HIGH (ref 150–400)
RBC: 3.84 MIL/uL — ABNORMAL LOW (ref 4.22–5.81)
RDW: 13.2 % (ref 11.5–15.5)
WBC: 10 10*3/uL (ref 4.0–10.5)
nRBC: 0 % (ref 0.0–0.2)

## 2024-02-08 LAB — RETICULOCYTES
Immature Retic Fract: 36 % — ABNORMAL HIGH (ref 2.3–15.9)
RBC.: 3.73 MIL/uL — ABNORMAL LOW (ref 4.22–5.81)
Retic Count, Absolute: 119 10*3/uL (ref 19.0–186.0)
Retic Ct Pct: 3.2 % — ABNORMAL HIGH (ref 0.4–3.1)

## 2024-02-08 LAB — VITAMIN B12: Vitamin B-12: 259 pg/mL (ref 180–914)

## 2024-02-08 LAB — FOLATE: Folate: 11 ng/mL (ref >5.9)

## 2024-02-08 LAB — SODIUM, URINE, RANDOM: Sodium, Ur: 78 mmol/L

## 2024-02-08 LAB — OSMOLALITY: Osmolality: 281 mosm/kg (ref 275–295)

## 2024-02-08 LAB — HIV ANTIBODY (ROUTINE TESTING W REFLEX): HIV Screen 4th Generation wRfx: NONREACTIVE

## 2024-02-08 LAB — AMMONIA: Ammonia: 62 umol/L — ABNORMAL HIGH (ref 9–35)

## 2024-02-08 LAB — STREP PNEUMONIAE URINARY ANTIGEN: Strep Pneumo Urinary Antigen: NEGATIVE

## 2024-02-08 LAB — MAGNESIUM: Magnesium: 2.2 mg/dL (ref 1.7–2.4)

## 2024-02-08 LAB — CREATININE, URINE, RANDOM: Creatinine, Urine: 46 mg/dL

## 2024-02-08 LAB — TROPONIN I (HIGH SENSITIVITY): Troponin I (High Sensitivity): 3 ng/L (ref <18)

## 2024-02-08 MED ORDER — PROPRANOLOL HCL 20 MG PO TABS: 20.0000 mg | ORAL_TABLET | Freq: Three times a day (TID) | ORAL | Status: DC | PRN

## 2024-02-08 MED ORDER — LORAZEPAM 1 MG PO TABS
1.0000 mg | ORAL_TABLET | ORAL | Status: DC | PRN
  Administered 2024-02-08: 1 mg via ORAL
  Filled 2024-02-08: qty 1

## 2024-02-08 MED ORDER — OXYCODONE HCL 5 MG PO TABS
5.0000 mg | ORAL_TABLET | Freq: Four times a day (QID) | ORAL | Status: DC | PRN
  Administered 2024-02-08 – 2024-02-09 (×2): 5 mg via ORAL
  Filled 2024-02-08 (×2): qty 1

## 2024-02-08 MED ORDER — LEVOTHYROXINE SODIUM 75 MCG PO TABS
75.0000 ug | ORAL_TABLET | Freq: Every day | ORAL | Status: DC
  Administered 2024-02-09: 75 ug via ORAL
  Filled 2024-02-08: qty 1

## 2024-02-08 MED ORDER — APIXABAN 5 MG PO TABS: 5.0000 mg | ORAL_TABLET | Freq: Two times a day (BID) | ORAL | Status: DC

## 2024-02-08 MED ORDER — LORAZEPAM 2 MG/ML IJ SOLN: 1.0000 mg | INTRAMUSCULAR | Status: DC | PRN

## 2024-02-08 MED ORDER — FOLIC ACID 1 MG PO TABS
1.0000 mg | ORAL_TABLET | Freq: Every day | ORAL | Status: DC
Start: 2024-02-08 — End: 2024-02-09
  Administered 2024-02-08 – 2024-02-09 (×2): 1 mg via ORAL
  Filled 2024-02-08 (×2): qty 1

## 2024-02-08 MED ORDER — HYDROMORPHONE HCL 1 MG/ML IJ SOLN
1.0000 mg | INTRAMUSCULAR | Status: DC | PRN
  Administered 2024-02-08 – 2024-02-09 (×6): 1 mg via INTRAVENOUS
  Filled 2024-02-08 (×6): qty 1

## 2024-02-08 MED ORDER — DULOXETINE HCL 60 MG PO CPEP
120.0000 mg | ORAL_CAPSULE | Freq: Every day | ORAL | Status: DC
  Administered 2024-02-08 – 2024-02-09 (×2): 120 mg via ORAL
  Filled 2024-02-08 (×2): qty 2

## 2024-02-08 MED ORDER — ADULT MULTIVITAMIN W/MINERALS CH
1.0000 | ORAL_TABLET | Freq: Every day | ORAL | Status: DC
Start: 2024-02-08 — End: 2024-02-09
  Administered 2024-02-08 – 2024-02-09 (×2): 1 via ORAL
  Filled 2024-02-08 (×2): qty 1

## 2024-02-08 MED ORDER — APIXABAN 5 MG PO TABS
10.0000 mg | ORAL_TABLET | Freq: Two times a day (BID) | ORAL | Status: DC
  Administered 2024-02-08 – 2024-02-09 (×3): 10 mg via ORAL
  Filled 2024-02-08 (×3): qty 2

## 2024-02-08 MED ORDER — ALLOPURINOL 300 MG PO TABS
300.0000 mg | ORAL_TABLET | Freq: Every day | ORAL | Status: DC
  Administered 2024-02-08 – 2024-02-09 (×2): 300 mg via ORAL
  Filled 2024-02-08 (×2): qty 1

## 2024-02-08 NOTE — Progress Notes (Signed)
 PROGRESS NOTE    Juan Clarke  BMW:[id number] DOB: [birth year] DOA: 02/07/2024 PCP: Perley Bradley, MD   Brief Narrative:  Patient is a 43 year old Caucasian male with past medical history significant for recent PE and DVTs as well as alcohol use disorder who presented with complaints of low blood pressure and feeling weak.  He was recently discharged from the hospital on 02/04/2024 after severe alcohol withdrawals which required intubation and complication of serotonin syndrome.  He has been hospitalized multiple times last week and was admitted on 6 3 2  through 25 for chest pain shortness breath and found of acute PE with right mild right strain and was discharged home on 6 7.  He went to hotel and been taking medications and was going to go to a substance abuse rehab facility but there was found to have low blood pressure and felt dizzy so he was sent to the hospital for further evaluation.  Patient continues to have some chest discomfort which is worse with deep inspiration and has been coughing.  States that his last drink was prior to admission.  Admitted for further evaluation found to have a questionable pneumonia and hypotension which is being treated.  Assessment and Plan:  SIRS from? HCAP (healthcare-associated pneumonia): Sepsis appears to be ruled out and he did not receive a 30 cc/kg.  Initially was hypotensive and has a questionable pneumonia but has a known PE.  Patient presenting with  productive cough, fever  and infiltrate in lower lobe on chest x-ray-Infiltrate on CXR and 2-3 characteristics (fever, leukocytosis, purulent sputum) are consistent with pneumonia however he has been recently diagnosed with a PE in a similar location.  Currently has been admitted and treated for HCAP and has now been added on cefepime  and doxycycline .  Sputum cultures are pending and blood cultures are pending.  Streptococcal pneumo UA antigen is negative and urine Legionella antigen is negative.  Repeat CXR  in a.m. and patient will need an amatory home O2 screen prior to discharge.  Continue with empiric antibiotics for now and de-escalate antibiotics; repeat chest x-ray today showed Stable right basilar opacity concerning for pneumonia.    Acute bilateral deep vein thrombosis (DVT) of popliteal veins (HCC): Continue Apixaban  10 mg po BID   Alcoholism (HCC) with concern for withdrawal: Order CIWA protocol , monitor for withdrawal and subsequently will add the every as needed order set. Transitional care coordinator consult   Abnormal LFTs/Elevated liver enzymes: In the setting of EToH abuse and slowly improving. AST went from 49 -> 43 and ALT went from 138 -> 117. CTM and Trend and repeat CMP in the AM Spoke to pt regarding continuing to avoid [facility]   Acquired hypothyroidism: Check TSH in the AM and C/w   Essential Hypertension: Hold Propranolol  given Hypotension    Pulmonary Embolism (HCC): C/w Apixaban  10 mg po BID x 7 days then 5 mg po BID   Hyponatremia: In the setting of EToH abuse. Na+ is now 134. Obtain urine electrolytes Given hypotension will rehydrate. CTM and Trend and repeat CMP in the AM  Normocytic Anemia: Hgb/Hct went from 10.9/33.5 -> 11.4/34.5. Checked Anemia Panel showed an iron level of 40, UIBC of 306, TIBC of 346, saturation ration of 12%, ferritin level of 292, folate level 11.0, and vitamin B12 level 259 CTM for S/Sx of Bleeding; No overt bleeding noted. Repeat CBC in the AM   Thrombocytosis: Improving. Platelet Count went from 604 -> 556. CTM and Trend and repeat CBC in  the AM  Gout: Continue with Allopurinol  300 po Daily   Hypoalbuminemia: Patient's Albumin Level has now gone from 3.2 -> 3.0. CTM and Trend and repeat CMP in the AM  Class II Obesity: Complicates overall prognosis and care. Estimated body mass index is 36.57 kg/m as calculated from the following:   Height as of this encounter: 5' 10 (1.778 m).   Weight as of this encounter: 115.6 kg. Weight Loss and  Dietary Counseling given   DVT prophylaxis:  apixaban  (ELIQUIS ) tablet 10 mg  apixaban  (ELIQUIS ) tablet 5 mg    Code Status: Full Code Family Communication: No family present @ Bedside  Disposition Plan:  Level of care: Progressive Status is: Inpatient Remains inpatient appropriate because: Needs further clinical improvement   Consultants:  None  Procedures:  As delineated as above  Antimicrobials:  Anti-infectives (From admission, onward)    Start     Dose/Rate Route Frequency Ordered Stop   02/08/24 0600  ceFEPIme  (MAXIPIME ) 2 g in sodium chloride  0.9 % 100 mL IVPB        2 g 200 mL/hr over 30 Minutes Intravenous Every 8 hours 02/07/24 2225     02/08/24 0400  vancomycin  (VANCOREADY) IVPB 1250 mg/250 mL        1,250 mg 166.7 mL/hr over 90 Minutes Intravenous Every 12 hours 02/07/24 2231     02/07/24 2130  doxycycline  (VIBRAMYCIN ) 100 mg in sodium chloride  0.9 % 250 mL IVPB        100 mg 125 mL/hr over 120 Minutes Intravenous Every 12 hours 02/07/24 2116     02/07/24 2100  vancomycin  (VANCOCIN ) IVPB 1000 mg/200 mL premix        1,000 mg 200 mL/hr over 60 Minutes Intravenous  Once 02/07/24 2052 02/08/24 0751   02/07/24 2100  ceFEPIme  (MAXIPIME ) 2 g in sodium chloride  0.9 % 100 mL IVPB        2 g 200 mL/hr over 30 Minutes Intravenous  Once 02/07/24 2052 02/08/24 0750       Subjective: Seen and examined at bedside and he is still hurting in his chest and also complaining of some weakness.  No nausea or vomiting.  But was withdrawing earlier.  No other concerns or complaints at this time.  Objective: Vitals:   02/08/24 0300 02/08/24 0641 02/08/24 0907 02/08/24 1059  BP: 117/75 123/73 123/73 125/77  Pulse: 93 92 92 92  Resp:  16  20  Temp: 98.7 F (37.1 C) 98.6 F (37 C)  98.9 F (37.2 C)  TempSrc: Oral Oral  Oral  SpO2: 96% 98%  96%  Weight:      Height:        Intake/Output Summary (Last 24 hours) at 02/08/2024 1857 Last data filed at 02/08/2024 1849 Gross per  24 hour  Intake 3096.78 ml  Output --  Net 3096.78 ml   Filed Weights   02/07/24 2247  Weight: 115.6 kg   Examination: Physical Exam:  Constitutional: Chronically ill-appearing Caucasian male who appears overall comfortable Respiratory: Diminished to auscultation bilaterally with some coarse breath sounds, no wheezing, rales, rhonchi or crackles. Normal respiratory effort and patient is not tachypenic. No accessory muscle use.  Unlabored breathing is not wearing a supplemental oxygen nasal cannula Cardiovascular: RRR, no murmurs / rubs / gallops. S1 and S2 auscultated.  Some slight lower extremity edema.  Abdomen: Soft, non-tender, distended secondary to body habitus. Bowel sounds positive.  GU: Deferred. Musculoskeletal: No clubbing / cyanosis of digits/nails. No joint deformity  upper and lower extremities.  Skin: No rashes, lesions, ulcers on limited skin evaluation. No induration; Warm and dry.  Neurologic: CN 2-12 grossly intact with no focal deficits but he is a little tremulous.  Romberg sign cerebellar reflexes not assessed.  Psychiatric: Normal judgment and insight. Alert and oriented x 3.   Data Reviewed: I have personally reviewed following labs and imaging studies  CBC: Recent Labs  Lab 02/03/24 0805 02/07/24 1933 02/08/24 0041 02/08/24 0519  WBC 11.4* 10.5 10.4 10.0  NEUTROABS 8.4*  --  6.2  --   HGB 10.2* 10.8* 10.9* 11.4*  HCT 32.6* 33.9* 33.5* 34.5*  MCV 94.2 91.4 91.0 89.8  PLT 348 614* 604* 556*   Basic Metabolic Panel: Recent Labs  Lab 02/02/24 0507 02/03/24 0805 02/07/24 1933 02/07/24 2130 02/08/24 1039  NA 129* 132* 131*  --  134*  K 4.2 4.0 4.2  --  3.6  CL 96* 96* 98  --  102  CO2 23 22 19*  --  21*  GLUCOSE 111* 97 92  --  97  BUN 6 16 15   --  14  CREATININE <0.30* 0.93 1.08  --  0.82  CALCIUM  8.7* 8.6* 8.9  --  9.0  MG 2.6*  --   --  2.3 2.2  PHOS 3.0  --   --  4.6 4.5   GFR: Estimated Creatinine Clearance: 150.9 mL/min (by C-G formula  based on SCr of 0.82 mg/dL). Liver Function Tests: Recent Labs  Lab 02/02/24 0507 02/03/24 0805 02/07/24 2130 02/08/24 1039  AST 46* 68* 49* 43*  ALT 105* 100* 138* 117*  ALKPHOS 198* 231* 291* 254*  BILITOT 1.3* 1.2 0.7 0.6  PROT 6.6 7.4 7.6 6.7  ALBUMIN 2.8* 3.0* 3.2* 3.0*   No results for input(s): LIPASE, AMYLASE in the last 168 hours. Recent Labs  Lab 02/08/24 0041  AMMONIA 62*   Coagulation Profile: Recent Labs  Lab 02/07/24 2130  INR 1.3*   Cardiac Enzymes: Recent Labs  Lab 02/07/24 2130  CKTOTAL 63   BNP (last 3 results) No results for input(s): PROBNP in the last 8760 hours. HbA1C: No results for input(s): HGBA1C in the last 72 hours. CBG: No results for input(s): GLUCAP in the last 168 hours. Lipid Profile: No results for input(s): CHOL, HDL, LDLCALC, TRIG, CHOLHDL, LDLDIRECT in the last 72 hours. Thyroid Function Tests: No results for input(s): TSH, T4TOTAL, FREET4, T3FREE, THYROIDAB in the last 72 hours. Anemia Panel: Recent Labs    02/07/24 2130 02/08/24 0041 02/08/24 0046  VITAMINB12  --   --  259  FOLATE  --   --  11.0  FERRITIN 292  --   --   TIBC 346  --   --   IRON 40*  --   --   RETICCTPCT  --  3.2*  --    Sepsis Labs: Recent Labs  Lab 02/07/24 2044 02/07/24 2130 02/07/24 2141  PROCALCITON  --  <0.10  --   LATICACIDVEN 1.8  --  1.5    Recent Results (from the past 240 hours)  Blood Culture (routine x 2)     Status: None   Collection Time: 01/31/24  3:46 AM   Specimen: BLOOD  Result Value Ref Range Status   Specimen Description   Final    BLOOD BLOOD RIGHT FOREARM Performed at [facility], [address], [place], [place] [address]    Special Requests   Final    BOTTLES DRAWN AEROBIC  AND ANAEROBIC Blood Culture adequate volume Performed at [facility], [address], [place], [place] [address]    Culture   Final    NO GROWTH 5 DAYS Performed at  [facility], [address] [place], [place] [address]    Report Status 02/05/2024 FINAL  Final  Resp panel by RT-PCR (RSV, Flu A&B, Covid)     Status: None   Collection Time: 01/31/24  4:15 AM   Specimen: Nasal Swab  Result Value Ref Range Status   SARS Coronavirus 2 by RT PCR NEGATIVE NEGATIVE Final    Comment: (NOTE) SARS-CoV-2 target nucleic acids are NOT DETECTED.  The SARS-CoV-2 RNA is generally detectable in upper respiratory specimens during the acute phase of infection. The lowest concentration of SARS-CoV-2 viral copies this assay can detect is 138 copies/mL. A negative result does not preclude SARS-Cov-2 infection and should not be used as the sole basis for treatment or other patient management decisions. A negative result may occur with  improper specimen collection/handling, submission of specimen other than nasopharyngeal swab, presence of viral mutation(s) within the areas targeted by this assay, and inadequate number of viral copies(<138 copies/mL). A negative result must be combined with clinical observations, patient history, and epidemiological information. The expected result is Negative.  Fact Sheet for Patients:  BloggerCourse.com  Fact Sheet for Healthcare Providers:  SeriousBroker.it  This test is no t yet approved or cleared by the [place]  FDA and  has been authorized for detection and/or diagnosis of SARS-CoV-2 by FDA under an Emergency Use Authorization (EUA). This EUA will remain  in effect (meaning this test can be used) for the duration of the COVID-19 declaration under Section 564(b)(1) of the Act, 21 U.S.C.section 360bbb-3(b)(1), unless the authorization is terminated  or revoked sooner.       Influenza A by PCR NEGATIVE NEGATIVE Final   Influenza B by PCR NEGATIVE NEGATIVE Final    Comment: (NOTE) The Xpert Xpress SARS-CoV-2/FLU/RSV plus assay is intended as an aid in the  diagnosis of influenza from Nasopharyngeal swab specimens and should not be used as a sole basis for treatment. Nasal washings and aspirates are unacceptable for Xpert Xpress SARS-CoV-2/FLU/RSV testing.  Fact Sheet for Patients: BloggerCourse.com  Fact Sheet for Healthcare Providers: SeriousBroker.it  This test is not yet approved or cleared by the [place]  FDA and has been authorized for detection and/or diagnosis of SARS-CoV-2 by FDA under an Emergency Use Authorization (EUA). This EUA will remain in effect (meaning this test can be used) for the duration of the COVID-19 declaration under Section 564(b)(1) of the Act, 21 U.S.C. section 360bbb-3(b)(1), unless the authorization is terminated or revoked.     Resp Syncytial Virus by PCR NEGATIVE NEGATIVE Final    Comment: (NOTE) Fact Sheet for Patients: BloggerCourse.com  Fact Sheet for Healthcare Providers: SeriousBroker.it  This test is not yet approved or cleared by the [place]  FDA and has been authorized for detection and/or diagnosis of SARS-CoV-2 by FDA under an Emergency Use Authorization (EUA). This EUA will remain in effect (meaning this test can be used) for the duration of the COVID-19 declaration under Section 564(b)(1) of the Act, 21 U.S.C. section 360bbb-3(b)(1), unless the authorization is terminated or revoked.  Performed at [facility], [address], [place], [place] [address]   Blood Culture (routine x 2)     Status: None   Collection Time: 01/31/24  4:10 PM   Specimen: BLOOD RIGHT HAND  Result  Value Ref Range Status   Specimen Description   Final    BLOOD RIGHT HAND Performed at [facility], [address], [place], [place] [address]    Special Requests   Final    BOTTLES DRAWN AEROBIC AND ANAEROBIC Blood Culture adequate volume Performed at [facility], [address] [place], [place] [address]    Culture   Final    NO GROWTH 5 DAYS Performed at [facility], [address], [place], [place] [address]    Report Status 02/05/2024 FINAL  Final  Blood Culture (routine x 2)     Status: None (Preliminary result)   Collection Time: 02/07/24  9:30 PM   Specimen: BLOOD LEFT ARM  Result Value Ref Range Status   Specimen Description   Final    BLOOD LEFT ARM Performed at [facility], [address], [place], [place] [address]    Special Requests   Final    BOTTLES DRAWN AEROBIC AND ANAEROBIC Blood Culture results may not be optimal due to an inadequate volume of blood received in culture bottles Performed at [facility], [address] [place], [place] [address]    Culture   Final    NO GROWTH < 12 HOURS Performed at [facility], [address] [place], [place] [address]    Report Status PENDING  Incomplete  Resp panel by RT-PCR (RSV, Flu A&B, Covid) Nasal Mucosa     Status: None   Collection Time: 02/07/24 10:04 PM   Specimen: Nasal Mucosa; Nasal Swab  Result Value Ref Range Status   SARS Coronavirus 2 by RT PCR NEGATIVE NEGATIVE Final    Comment: (NOTE) SARS-CoV-2 target nucleic acids are NOT DETECTED.  The SARS-CoV-2 RNA is generally detectable in upper respiratory specimens during the acute phase of infection. The lowest concentration of SARS-CoV-2 viral copies this assay can detect is 138 copies/mL. A negative result does not preclude SARS-Cov-2 infection and should not be used as the sole basis for treatment or other patient management decisions. A negative result may occur with  improper specimen collection/handling, submission of specimen other than nasopharyngeal swab, presence of viral mutation(s) within the areas targeted by this assay, and inadequate number of viral copies(<138 copies/mL). A negative result must be combined with clinical observations,  patient history, and epidemiological information. The expected result is Negative.  Fact Sheet for Patients:  BloggerCourse.com  Fact Sheet for Healthcare Providers:  SeriousBroker.it  This test is no t yet approved or cleared by the [place]  FDA and  has been authorized for detection and/or diagnosis of SARS-CoV-2 by FDA under an Emergency Use Authorization (EUA). This EUA will remain  in effect (meaning this test can be used) for the duration of the COVID-19 declaration under Section 564(b)(1) of the Act, 21 U.S.C.section 360bbb-3(b)(1), unless the authorization is terminated  or revoked sooner.       Influenza A by PCR NEGATIVE NEGATIVE Final   Influenza B by PCR NEGATIVE NEGATIVE Final    Comment: (NOTE) The Xpert Xpress SARS-CoV-2/FLU/RSV plus assay is intended as an aid in the diagnosis of influenza from Nasopharyngeal swab specimens and should not be used as a sole basis for treatment. Nasal washings and aspirates are unacceptable for Xpert Xpress SARS-CoV-2/FLU/RSV testing.  Fact Sheet for Patients: BloggerCourse.com  Fact Sheet for Healthcare Providers: SeriousBroker.it  This test is not yet approved or cleared by the [place]  FDA and has been authorized  for detection and/or diagnosis of SARS-CoV-2 by FDA under an Emergency Use Authorization (EUA). This EUA will remain in effect (meaning this test can be used) for the duration of the COVID-19 declaration under Section 564(b)(1) of the Act, 21 U.S.C. section 360bbb-3(b)(1), unless the authorization is terminated or revoked.     Resp Syncytial Virus by PCR NEGATIVE NEGATIVE Final    Comment: (NOTE) Fact Sheet for Patients: BloggerCourse.com  Fact Sheet for Healthcare Providers: SeriousBroker.it  This test is not yet approved or cleared by the [place] FDA and has been authorized for detection and/or diagnosis of SARS-CoV-2 by FDA under an Emergency Use Authorization (EUA). This EUA will remain in effect (meaning this test can be used) for the duration of the COVID-19 declaration under Section 564(b)(1) of the Act, 21 U.S.C. section 360bbb-3(b)(1), unless the authorization is terminated or revoked.  Performed at [facility], [address], [place], [place] [address]   MRSA Next Gen by PCR, Nasal     Status: None   Collection Time: 02/07/24 10:05 PM   Specimen: Nasal Mucosa; Nasal Swab  Result Value Ref Range Status   MRSA by PCR Next Gen NOT DETECTED NOT DETECTED Final    Comment: (NOTE) The GeneXpert MRSA Assay (FDA approved for NASAL specimens only), is one component of a comprehensive MRSA colonization surveillance program. It is not intended to diagnose MRSA infection nor to guide or monitor treatment for MRSA infections. Test performance is not FDA approved in patients less than 69 years old. Performed at [facility], [address], [place], [place] [address]   Blood Culture (routine x 2)     Status: None (Preliminary result)   Collection Time: 02/08/24 12:41 AM   Specimen: BLOOD RIGHT ARM  Result Value Ref Range Status   Specimen Description   Final    BLOOD RIGHT ARM Performed at [facility], [address], [place], [place] [address]    Special Requests   Final    BOTTLES DRAWN AEROBIC AND ANAEROBIC Blood Culture results may not be optimal due to an inadequate volume of blood received in culture bottles Performed at [facility], [address], [place], [place] [address]    Culture   Final    NO GROWTH < 12 HOURS Performed at [facility], [address] [place], [place] [address]    Report Status PENDING  Incomplete    Radiology Studies: DG CHEST PORT 1 VIEW Result Date: 02/08/2024 CLINICAL DATA:  Pneumonia. EXAM:  PORTABLE CHEST 1 VIEW COMPARISON:  February 07, 2024. FINDINGS: The heart size and mediastinal contours are within normal limits. Left lung is clear. Stable right basilar opacity concerning for pneumonia. The visualized skeletal structures are unremarkable. IMPRESSION: Stable right basilar opacity concerning for pneumonia. Electronically Signed   By: Rosalene Colon M.D.   On: 02/08/2024 12:23   DG Chest 2 View Result Date: 02/07/2024 CLINICAL DATA:  Chest pain and near syncope EXAM: CHEST - 2 VIEW COMPARISON:  Chest radiograph dated 01/30/2024 FINDINGS: Normal lung volumes. Confluent opacity in the right lower lobe. Blunting of the right costophrenic angle. No pneumothorax. The heart size and mediastinal contours are within normal limits. No acute osseous abnormality. IMPRESSION: 1. Confluent opacity in the right lower lobe, suspicious for pneumonia. 2. Blunting of the right costophrenic angle, which may represent a small pleural effusion. Electronically Signed   By: Limin  Xu M.D.   On: 02/07/2024 20:27  Scheduled Meds:  allopurinol   300 mg Oral Daily   apixaban   10 mg Oral BID   Followed by   Cecily Cohen ON 02/10/2024] apixaban   5 mg Oral BID   DULoxetine   120 mg Oral Daily   folic acid   1 mg Oral Daily   [START ON 02/09/2024] levothyroxine   75 mcg Oral QAC breakfast   LORazepam   0-4 mg Intravenous Q6H   Or   LORazepam   0-4 mg Oral Q6H   [START ON 02/10/2024] LORazepam   0-4 mg Intravenous Q12H   Or   [START ON 02/10/2024] LORazepam   0-4 mg Oral Q12H   multivitamin with minerals  1 tablet Oral Daily   thiamine   100 mg Oral Daily   Or   thiamine   100 mg Intravenous Daily   Continuous Infusions:  sodium chloride  125 mL/hr at 02/08/24 1224   ceFEPime  (MAXIPIME ) IV 2 g (02/08/24 1229)   doxycycline  (VIBRAMYCIN ) IV 100 mg (02/08/24 0859)   vancomycin  1,250 mg (02/08/24 1610)    LOS: 0 days   Aura Leeds, DO [facility] Available via Epic secure chat 7am-7pm After these hours, please refer  to coverage provider listed on [email or web address] 02/08/2024, 6:57 PM

## 2024-02-08 NOTE — TOC Initial Note (Signed)
 Transition of Care [facility]) - Initial/Assessment Note   Patient Details  Name: Juan Clarke MRN: [id number] Date of Birth: 09/10/1980  Transition of Care [facility]) CM/SW Contact:    Zenon Hilda, LCSW Phone Number: 02/08/2024, 10:19 AM  Clinical Narrative: [facility] consulted for ETOH use resources. CSW met with patient to discuss consult. Per patient, he was admitted from [facility] and plans to return there at discharge but will need his medical records from this hospital stay faxed to the facility. CSW spoke with Camilo Cella in admissions at [facility] and she provided fax number [phone]) for records to be sent at discharge.  Expected Discharge Plan: Home/Self Care ([facility]) Barriers to Discharge: Continued Medical Work up  Patient Goals and CMS Choice Patient states their goals for this hospitalization and ongoing recovery are:: Return to [facility] Choice offered to / list presented to : NA  Expected Discharge Plan and Services In-house Referral: Clinical Social Work Post Acute Care Choice: NA Living arrangements for the past 2 months: Single Family Home            DME Arranged: N/A DME Agency: NA  Prior Living Arrangements/Services Living arrangements for the past 2 months: Single Family Home Patient language and need for interpreter reviewed:: Yes Do you feel safe going back to the place where you live?: Yes      Need for Family Participation in Patient Care: No (Comment) Care giver support system in place?: Yes (comment) Criminal Activity/Legal Involvement Pertinent to Current Situation/Hospitalization: No - Comment as needed  Activities of Daily Living ADL Screening (condition at time of admission) Independently performs ADLs?: Yes (appropriate for developmental age) Is the patient deaf or have difficulty hearing?: No Does the patient have difficulty seeing, even when wearing glasses/contacts?: No Does the patient have difficulty concentrating,  remembering, or making decisions?: No  Permission Sought/Granted Permission sought to share information with : Facility Industrial/product designer granted to share information with : Yes, Verbal Permission Granted Permission granted to share info w AGENCY: [facility]  Emotional Assessment Appearance:: Appears stated age Attitude/Demeanor/Rapport: Engaged Affect (typically observed): Appropriate Orientation: : Oriented to Self, Oriented to Place, Oriented to  Time, Oriented to Situation Alcohol / Substance Use: Alcohol Use Psych Involvement: No (comment)  Admission diagnosis:  CAP (community acquired pneumonia) [J18.9] HAP (hospital-acquired pneumonia) [J18.9, Y95] Patient Active Problem List   Diagnosis Date Noted   HCAP (healthcare-associated pneumonia) 02/07/2024   Severe sepsis (HCC) 02/07/2024   Hyponatremia 02/07/2024   Pulmonary embolism (HCC) 01/31/2024   Prolonged QT interval 01/31/2024   Acquired hypothyroidism 01/31/2024   Alcoholism (HCC) 01/31/2024   Elevated liver enzymes 01/31/2024   Essential hypertension 01/31/2024   Mixed hyperlipidemia 01/31/2024   Class 3 obesity 01/31/2024   Acute bilateral deep vein thrombosis (DVT) of popliteal veins (HCC) 01/31/2024   Class 2 obesity 01/31/2024   Serotonin syndrome 01/24/2024   Alcohol withdrawal delirium (HCC) 01/18/2024   Hypokalemia 08/15/2023   Acute liver failure without hepatic coma 08/11/2023   Alcohol withdrawal syndrome, with delirium (HCC) 08/11/2023   Acute hepatic encephalopathy (HCC) 08/11/2023   Altered mental status 08/10/2023   Toxic metabolic encephalopathy 08/10/2023   Acute metabolic encephalopathy 08/10/2023   ETOH abuse 11/28/2022   Neck pain 02/25/2015   Headache 02/25/2015   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 [place] [address] Phone: [phone] Fax: [phone]  Social Drivers of Health (SDOH) Social History: SDOH Screenings   Food Insecurity: No Food Insecurity (02/07/2024)  Housing: Low Risk  (02/07/2024)  Transportation Needs: No Transportation Needs (02/07/2024)  Utilities: Not At Risk (02/07/2024)  Social Connections: Unknown (01/11/2022)   Received from [facility]  Tobacco Use: Low Risk  (02/07/2024)   SDOH Interventions:    Readmission Risk Interventions    02/01/2024    1:16 PM 01/20/2024   12:44 PM 01/20/2024    9:59 AM  Readmission Risk Prevention Plan  Transportation Screening Complete Complete Complete  PCP or Specialist Appt within 3-5 Days  Complete Complete  HRI or Home Care Consult  Complete   Social Work Consult for Recovery Care Planning/Counseling  Complete   Palliative Care Screening   Complete  Medication Review Oceanographer) Complete Complete   PCP or Specialist appointment within 3-5 days of discharge Complete    HRI or Home Care Consult Complete    SW Recovery Care/Counseling Consult Complete    Palliative Care Screening Not Applicable    Skilled Nursing Facility Complete

## 2024-02-08 NOTE — Hospital Course (Addendum)
 Patient is a 43 year old Caucasian male with past medical history significant for recent PE and DVTs as well as alcohol use disorder who presented with complaints of low blood pressure and feeling weak.  He was recently discharged from the hospital on 02/04/2024 after severe alcohol withdrawals which required intubation and complication of serotonin syndrome.  He has been hospitalized multiple times last week and was admitted on 6 3 2  through 25 for chest pain shortness breath and found of acute PE with right mild right strain and was discharged home on 6 7.  He went to hotel and been taking medications and was going to go to a substance abuse rehab facility but there was found to have low blood pressure and felt dizzy so he was sent to the hospital for further evaluation.  Patient continues to have some chest discomfort which is worse with deep inspiration and has been coughing.  States that his last drink was prior to admission.  Admitted for further evaluation found to have a questionable pneumonia and hypotension which is being treated.  Assessment and Plan:  SIRS from? HCAP (healthcare-associated pneumonia): Sepsis appears to be ruled out and he did not receive a 30 cc/kg.  Initially was hypotensive and has a questionable pneumonia but has a known PE.  Patient presenting with  productive cough, fever  and infiltrate in lower lobe on chest x-ray-Infiltrate on CXR and 2-3 characteristics (fever, leukocytosis, purulent sputum) are consistent with pneumonia however he has been recently diagnosed with a PE in a similar location.  Currently has been admitted and treated for HCAP and has now been added on cefepime  and doxycycline .  Sputum cultures are pending and blood cultures are pending.  Streptococcal pneumo UA antigen is negative and urine Legionella antigen is negative.  Did not desaturate on amatory home O2 screen continue with empiric antibiotics for now and de-escalate antibiotics; repeat chest x-ray  yesterday showed Stable right basilar opacity concerning for pneumonia.  Will continue antibiotics for now and have outpatient follow-up as he is afebrile, has no leukocytosis   Acute bilateral deep vein thrombosis (DVT) of popliteal veins (HCC): Continue Apixaban  10 mg po BID   Alcoholism (HCC) with concern for withdrawal: Order CIWA protocol , monitor for withdrawal and subsequently will add the every as needed order set. Transitional care coordinator consult.  He is doing stable from a CIWA protocol and his CIWA scores were low   Abnormal LFTs/Elevated liver enzymes: In the setting of EToH abuse and slowly improving. AST went from 49 -> 43 and ALT went from 138 -> 117. CTM and Trend and repeat CMP in the AM Spoke to pt regarding continuing to avoid [facility]   Acquired hypothyroidism: Check TSH in the AM and C/w levothyroxine   Essential Hypertension: Hold Propranolol  given Hypotension; hypotension is improved and will resume propranolol  discharge   Pulmonary Embolism [facility]): C/w Apixaban  10 mg po BID x 7 days then 5 mg po BID.  Has follow-up with pulmonary outpatient setting   Hyponatremia: In the setting of EToH abuse. Na+ is now 134. Obtain urine electrolytes Given hypotension will rehydrate. CTM and Trend and repeat CMP in the AM  Normocytic Anemia: Hgb/Hct went from 10.9/33.5 -> 11.4/34.5 and is now 10.8/34.1. Checked Anemia Panel showed an iron level of 40, UIBC of 306, TIBC of 346, saturation ration of 12%, ferritin level of 292, folate level 11.0, and vitamin B12 level 259 CTM for S/Sx of Bleeding; No overt bleeding noted. Repeat CBC in the AM  Thrombocytosis: Improving. Platelet Count went from 604 -> 556 is now trended down to 461. CTM and Trend and repeat CBC in the AM  Gout: Continue with Allopurinol  300 po Daily   Hypoalbuminemia: Patient's Albumin Level has now gone from 3.2 -> 3.0 and is now 2.9. CTM and Trend and repeat CMP in the AM  Class II Obesity: Complicates overall  prognosis and care. Estimated body mass index is 36.57 kg/m as calculated from the following:   Height as of this encounter: 5' 10 (1.778 m).   Weight as of this encounter: 115.6 kg. Weight Loss and Dietary Counseling given

## 2024-02-09 ENCOUNTER — Other Ambulatory Visit (HOSPITAL_COMMUNITY): Payer: Self-pay

## 2024-02-09 ENCOUNTER — Telehealth: Payer: Self-pay | Admitting: Pulmonary Disease

## 2024-02-09 DIAGNOSIS — F1020 Alcohol dependence, uncomplicated: Secondary | ICD-10-CM | POA: Diagnosis not present

## 2024-02-09 DIAGNOSIS — I82433 Acute embolism and thrombosis of popliteal vein, bilateral: Secondary | ICD-10-CM | POA: Diagnosis not present

## 2024-02-09 DIAGNOSIS — J189 Pneumonia, unspecified organism: Secondary | ICD-10-CM | POA: Diagnosis not present

## 2024-02-09 DIAGNOSIS — E66813 Obesity, class 3: Secondary | ICD-10-CM | POA: Diagnosis not present

## 2024-02-09 LAB — CBC WITH DIFFERENTIAL/PLATELET
Abs Immature Granulocytes: 0.3 10*3/uL — ABNORMAL HIGH (ref 0.00–0.07)
Basophils Absolute: 0.1 10*3/uL (ref 0.0–0.1)
Basophils Relative: 1 %
Eosinophils Absolute: 0.5 10*3/uL (ref 0.0–0.5)
Eosinophils Relative: 6 %
HCT: 34.1 % — ABNORMAL LOW (ref 39.0–52.0)
Hemoglobin: 10.8 g/dL — ABNORMAL LOW (ref 13.0–17.0)
Immature Granulocytes: 4 %
Lymphocytes Relative: 18 %
Lymphs Abs: 1.4 10*3/uL (ref 0.7–4.0)
MCH: 29.5 pg (ref 26.0–34.0)
MCHC: 31.7 g/dL (ref 30.0–36.0)
MCV: 93.2 fL (ref 80.0–100.0)
Monocytes Absolute: 0.7 10*3/uL (ref 0.1–1.0)
Monocytes Relative: 9 %
Neutro Abs: 5.1 10*3/uL (ref 1.7–7.7)
Neutrophils Relative %: 62 %
Platelets: 461 10*3/uL — ABNORMAL HIGH (ref 150–400)
RBC: 3.66 MIL/uL — ABNORMAL LOW (ref 4.22–5.81)
RDW: 13 % (ref 11.5–15.5)
WBC: 8.1 10*3/uL (ref 4.0–10.5)
nRBC: 0 % (ref 0.0–0.2)

## 2024-02-09 LAB — COMPREHENSIVE METABOLIC PANEL WITH GFR
ALT: 94 U/L — ABNORMAL HIGH (ref 0–44)
AST: 34 U/L (ref 15–41)
Albumin: 2.9 g/dL — ABNORMAL LOW (ref 3.5–5.0)
Alkaline Phosphatase: 223 U/L — ABNORMAL HIGH (ref 38–126)
Anion gap: 9 (ref 5–15)
BUN: 12 mg/dL (ref 6–20)
CO2: 22 mmol/L (ref 22–32)
Calcium: 8.7 mg/dL — ABNORMAL LOW (ref 8.9–10.3)
Chloride: 103 mmol/L (ref 98–111)
Creatinine, Ser: 0.84 mg/dL (ref 0.61–1.24)
GFR, Estimated: >60 mL/min (ref >60)
Glucose, Bld: 106 mg/dL — ABNORMAL HIGH (ref 70–99)
Potassium: 4.3 mmol/L (ref 3.5–5.1)
Sodium: 134 mmol/L — ABNORMAL LOW (ref 135–145)
Total Bilirubin: 0.7 mg/dL (ref 0.0–1.2)
Total Protein: 6.4 g/dL — ABNORMAL LOW (ref 6.5–8.1)

## 2024-02-09 LAB — LEGIONELLA PNEUMOPHILA SEROGP 1 UR AG: L. pneumophila Serogp 1 Ur Ag: NEGATIVE

## 2024-02-09 LAB — MAGNESIUM: Magnesium: 2.1 mg/dL (ref 1.7–2.4)

## 2024-02-09 LAB — PHOSPHORUS: Phosphorus: 4.8 mg/dL — ABNORMAL HIGH (ref 2.5–4.6)

## 2024-02-09 MED ORDER — ADULT MULTIVITAMIN W/MINERALS CH
1.0000 | ORAL_TABLET | Freq: Every day | ORAL | 0 refills | Status: DC
  Filled 2024-02-09: qty 30, 30d supply, fill #0

## 2024-02-09 MED ORDER — LISINOPRIL 20 MG PO TABS
20.0000 mg | ORAL_TABLET | Freq: Every day | ORAL | 11 refills | Status: DC
  Filled 2024-02-09: qty 30, 30d supply, fill #0

## 2024-02-09 MED ORDER — AMOXICILLIN-POT CLAVULANATE 875-125 MG PO TABS
1.0000 | ORAL_TABLET | Freq: Two times a day (BID) | ORAL | Status: DC
  Administered 2024-02-09: 1 via ORAL
  Filled 2024-02-09: qty 1

## 2024-02-09 MED ORDER — DOXYCYCLINE HYCLATE 100 MG PO TABS
100.0000 mg | ORAL_TABLET | Freq: Two times a day (BID) | ORAL | Status: DC
  Administered 2024-02-09: 100 mg via ORAL
  Filled 2024-02-09: qty 1

## 2024-02-09 MED ORDER — FOLIC ACID 1 MG PO TABS
1.0000 mg | ORAL_TABLET | Freq: Every day | ORAL | 0 refills | Status: DC
  Filled 2024-02-09: qty 30, 30d supply, fill #0

## 2024-02-09 MED ORDER — VITAMIN B-1 100 MG PO TABS
100.0000 mg | ORAL_TABLET | Freq: Every day | ORAL | 0 refills | Status: DC
  Filled 2024-02-09: qty 30, 30d supply, fill #0

## 2024-02-09 MED ORDER — AMOXICILLIN-POT CLAVULANATE 875-125 MG PO TABS
1.0000 | ORAL_TABLET | Freq: Two times a day (BID) | ORAL | 0 refills | Status: AC
Start: 2024-02-09 — End: 2024-02-16
  Filled 2024-02-09: qty 14, 7d supply, fill #0

## 2024-02-09 NOTE — Telephone Encounter (Signed)
 Patient has been scheduled

## 2024-02-09 NOTE — Telephone Encounter (Signed)
 Please schedule patient for hospital follow up of pulmonary embolism with any provider in 4-6 weeks.  Thanks, JD

## 2024-02-09 NOTE — TOC Transition Note (Signed)
 Transition of Care [facility]) - Discharge Note  Patient Details  Name: Juan Clarke MRN: [id number] Date of Birth: September 29, 1980  Transition of Care [facility]) CM/SW Contact:  Zenon Hilda, LCSW Phone Number: 02/09/2024, 2:48 PM  Clinical Narrative: Medical records faxed to [facility] [phone]). TOC signing off.  Final next level of care:  ([facility]) Barriers to Discharge: Barriers Resolved  Patient Goals and CMS Choice Patient states their goals for this hospitalization and ongoing recovery are:: Return to [facility] Choice offered to / list presented to : NA  Discharge Plan and Services Additional resources added to the After Visit Summary for   In-house Referral: Clinical Social Work Post Acute Care Choice: NA          DME Arranged: N/A DME Agency: NA  Social Drivers of Health (SDOH) Interventions SDOH Screenings   Food Insecurity: No Food Insecurity (02/07/2024)  Housing: Low Risk  (02/07/2024)  Transportation Needs: No Transportation Needs (02/07/2024)  Utilities: Not At Risk (02/07/2024)  Social Connections: Unknown (01/11/2022)   Received from [facility]  Tobacco Use: Low Risk  (02/07/2024)   Readmission Risk Interventions    02/01/2024    1:16 PM 01/20/2024   12:44 PM 01/20/2024    9:59 AM  Readmission Risk Prevention Plan  Transportation Screening Complete Complete Complete  PCP or Specialist Appt within 3-5 Days  Complete Complete  HRI or Home Care Consult  Complete   Social Work Consult for Recovery Care Planning/Counseling  Complete   Palliative Care Screening   Complete  Medication Review Oceanographer) Complete Complete   PCP or Specialist appointment within 3-5 days of discharge Complete    HRI or Home Care Consult Complete    SW Recovery Care/Counseling Consult Complete    Palliative Care Screening Not Applicable    Skilled Nursing Facility Complete

## 2024-02-09 NOTE — Discharge Summary (Signed)
 Physician Discharge Summary   Patient: Juan Clarke MRN: [id number] DOB: 02/08/1981  Admit date:     02/07/2024  Discharge date: 02/09/2024  Discharge Physician: Aura Leeds, DO   PCP: Perley Bradley, MD   Recommendations at discharge:   Follow-up with PCP within 1 to 2 weeks repeat CBC, CMP, mag, Phos within 1 week Follow-up with pulmonary in outpatient setting within 4 to 6 weeks for pulmonary evaluation follow-up  Discharge Diagnoses: Principal Problem:   HCAP (healthcare-associated pneumonia) Active Problems:   Pulmonary embolism (HCC)   Alcoholism (HCC)   Elevated liver enzymes   Essential hypertension   Class 3 obesity   Acute bilateral deep vein thrombosis (DVT) of popliteal veins (HCC)   Severe sepsis (HCC)   Hyponatremia   CAP (community acquired pneumonia)  Resolved Problems:   * No resolved hospital problems. [facility] Course: Patient is a 43 year old Caucasian male with past medical history significant for recent PE and DVTs as well as alcohol use disorder who presented with complaints of low blood pressure and feeling weak.  He was recently discharged from the hospital on 02/04/2024 after severe alcohol withdrawals which required intubation and complication of serotonin syndrome.  He has been hospitalized multiple times last week and was admitted on 6 3 2  through 25 for chest pain shortness breath and found of acute PE with right mild right strain and was discharged home on 6 7.  He went to hotel and been taking medications and was going to go to a substance abuse rehab facility but there was found to have low blood pressure and felt dizzy so he was sent to the hospital for further evaluation.  Patient continues to have some chest discomfort which is worse with deep inspiration and has been coughing.  States that his last drink was prior to admission.  Admitted for further evaluation found to have a questionable pneumonia and hypotension which is being  treated.  Assessment and Plan:  SIRS from? HCAP (healthcare-associated pneumonia): Sepsis appears to be ruled out and he did not receive a 30 cc/kg.  Initially was hypotensive and has a questionable pneumonia but has a known PE.  Patient presenting with  productive cough, fever  and infiltrate in lower lobe on chest x-ray-Infiltrate on CXR and 2-3 characteristics (fever, leukocytosis, purulent sputum) are consistent with pneumonia however he has been recently diagnosed with a PE in a similar location.  Currently has been admitted and treated for HCAP and has now been added on cefepime  and doxycycline .  Sputum cultures are pending and blood cultures are pending.  Streptococcal pneumo UA antigen is negative and urine Legionella antigen is negative.  Did not desaturate on amatory home O2 screen continue with empiric antibiotics for now and de-escalate antibiotics; repeat chest x-ray yesterday showed Stable right basilar opacity concerning for pneumonia.  Will continue antibiotics for now and have outpatient follow-up as he is afebrile, has no leukocytosis   Acute bilateral deep vein thrombosis (DVT) of popliteal veins (HCC): Continue Apixaban  10 mg po BID   Alcoholism (HCC) with concern for withdrawal: Order CIWA protocol , monitor for withdrawal and subsequently will add the every as needed order set. Transitional care coordinator consult.  He is doing stable from a CIWA protocol and his CIWA scores were low   Abnormal LFTs/Elevated liver enzymes: In the setting of EToH abuse and slowly improving. AST went from 49 -> 43 and ALT went from 138 -> 117. CTM and Trend and repeat CMP in the  AM Spoke to pt regarding continuing to avoid EtoH   Acquired hypothyroidism: Check TSH in the AM and C/w levothyroxine   Essential Hypertension: Hold Propranolol  given Hypotension; hypotension is improved and will resume propranolol  discharge   Pulmonary Embolism [facility]): C/w Apixaban  10 mg po BID x 7 days then 5 mg po BID.   Has follow-up with pulmonary outpatient setting   Hyponatremia: In the setting of EToH abuse. Na+ is now 134. Obtain urine electrolytes Given hypotension will rehydrate. CTM and Trend and repeat CMP in the AM  Normocytic Anemia: Hgb/Hct went from 10.9/33.5 -> 11.4/34.5 and is now 10.8/34.1. Checked Anemia Panel showed an iron level of 40, UIBC of 306, TIBC of 346, saturation ration of 12%, ferritin level of 292, folate level 11.0, and vitamin B12 level 259 CTM for S/Sx of Bleeding; No overt bleeding noted. Repeat CBC in the AM   Thrombocytosis: Improving. Platelet Count went from 604 -> 556 is now trended down to 461. CTM and Trend and repeat CBC in the AM  Gout: Continue with Allopurinol  300 po Daily   Hypoalbuminemia: Patient's Albumin Level has now gone from 3.2 -> 3.0 and is now 2.9. CTM and Trend and repeat CMP in the AM  Class II Obesity: Complicates overall prognosis and care. Estimated body mass index is 36.57 kg/m as calculated from the following:   Height as of this encounter: 5' 10 (1.778 m).   Weight as of this encounter: 115.6 kg. Weight Loss and Dietary Counseling given  Consultants: None Procedures performed: As delineated above  Disposition: [facility] rehab Diet recommendation:  Discharge Diet Orders (From admission, onward)     Start     Ordered   02/09/24 0000  Diet - low sodium heart healthy        02/09/24 1147           Cardiac diet DISCHARGE MEDICATION: Allergies as of 02/09/2024       Reactions   Lexapro [escitalopram Oxalate] Other (See Comments)   Lack of therapeutic effect    Zoloft [sertraline Hcl] Other (See Comments)   Lack of therapeutic effect         Medication List     STOP taking these medications    lisinopril -hydrochlorothiazide  20-25 MG tablet Commonly known as: ZESTORETIC        TAKE these medications    acetaminophen  325 MG tablet Commonly known as: TYLENOL  Take 325-650 mg by mouth every 6 (six) hours as needed  for mild pain (pain score 1-3) or headache.   allopurinol  300 MG tablet Commonly known as: ZYLOPRIM  Take 300 mg by mouth daily.   amoxicillin -clavulanate 875-125 MG tablet Commonly known as: AUGMENTIN  Take 1 tablet by mouth every 12 (twelve) hours for 7 days.   CertaVite/Antioxidants Tabs Take 1 tablet by mouth daily.   DULoxetine  60 MG capsule Commonly known as: CYMBALTA  Take 120 mg by mouth daily.   Eliquis  DVT/PE Starter Pack Generic drug: Apixaban  Starter Pack (10mg  and 5mg ) Take as directed on package: start with two-5mg  tablets twice daily for 7 days. On day 8, switch to one-5mg  tablet twice daily.   folic acid  1 MG tablet Commonly known as: FOLVITE  Take 1 tablet (1 mg total) by mouth daily.   levothyroxine  75 MCG tablet Commonly known as: SYNTHROID  Take 75 mcg by mouth daily before breakfast.   lisinopril  20 MG tablet Commonly known as: ZESTRIL  Take 1 tablet (20 mg total) by mouth daily.   methocarbamol  500 MG tablet Commonly known as: ROBAXIN   Take 1 tablet (500 mg total) by mouth 4 (four) times daily for 7 days.   oxyCODONE  5 MG immediate release tablet Commonly known as: Roxicodone  Take 1 tablet (5 mg total) by mouth every 6 (six) hours as needed.   Pepcid  Complete 10-800-165 MG chewable tablet Generic drug: famotidine -calcium  carbonate-magnesium  hydroxide Chew 1 tablet by mouth daily as needed (for heartburn or reflux).   propranolol  20 MG tablet Commonly known as: INDERAL  Take 20 mg by mouth 3 (three) times daily as needed (Anxiety).   thiamine  100 MG tablet Commonly known as: VITAMIN B1 Take 1 tablet (100 mg total) by mouth daily.   traZODone  50 MG tablet Commonly known as: DESYREL  Take 50 mg by mouth at bedtime as needed for sleep.        Discharge Exam: Filed Weights   02/07/24 2247  Weight: 115.6 kg   Vitals:   02/09/24 0524 02/09/24 1152  BP: 125/72 123/80  Pulse: 82 84  Resp:  19  Temp: 97.6 F (36.4 C) 97.6 F (36.4 C)  SpO2:  97% 97%   Examination: Physical Exam:  Constitutional: WN/WD obese Caucasian male in no acute distress Respiratory: Diminished to auscultation bilaterally, no wheezing, rales, rhonchi or crackles. Normal respiratory effort and patient is not tachypenic. No accessory muscle use.  Unlabored breathing Cardiovascular: RRR, no murmurs / rubs / gallops. S1 and S2 auscultated.  Mild extremity edema Abdomen: Soft, non-tender, distended secondary body habitus. Bowel sounds positive.  GU: Deferred. Musculoskeletal: No clubbing / cyanosis of digits/nails. No joint deformity upper and lower extremities.  Skin: No rashes, lesions, ulcers on limited skin evaluation. No induration; Warm and dry.  Neurologic: CN 2-12 grossly intact with no focal deficits. Romberg sign cerebellar reflexes not assessed.  Psychiatric: Normal judgment and insight. Alert and oriented x 3. Normal mood and appropriate affect.   Condition at discharge: stable  The results of significant diagnostics from this hospitalization (including imaging, microbiology, ancillary and laboratory) are listed below for reference.   Imaging Studies: DG CHEST PORT 1 VIEW Result Date: 02/08/2024 CLINICAL DATA:  Pneumonia. EXAM: PORTABLE CHEST 1 VIEW COMPARISON:  February 07, 2024. FINDINGS: The heart size and mediastinal contours are within normal limits. Left lung is clear. Stable right basilar opacity concerning for pneumonia. The visualized skeletal structures are unremarkable. IMPRESSION: Stable right basilar opacity concerning for pneumonia. Electronically Signed   By: Rosalene Colon M.D.   On: 02/08/2024 12:23   DG Chest 2 View Result Date: 02/07/2024 CLINICAL DATA:  Chest pain and near syncope EXAM: CHEST - 2 VIEW COMPARISON:  Chest radiograph dated 01/30/2024 FINDINGS: Normal lung volumes. Confluent opacity in the right lower lobe. Blunting of the right costophrenic angle. No pneumothorax. The heart size and mediastinal contours are within normal  limits. No acute osseous abnormality. IMPRESSION: 1. Confluent opacity in the right lower lobe, suspicious for pneumonia. 2. Blunting of the right costophrenic angle, which may represent a small pleural effusion. Electronically Signed   By: Limin  Xu M.D.   On: 02/07/2024 20:27   ECHOCARDIOGRAM COMPLETE Result Date: 02/01/2024    ECHOCARDIOGRAM REPORT   Patient Name:   SHAUNTE WEISSINGER Date of Exam: 02/01/2024 Medical Rec #:  [id number]        Height:       72.0 in Accession #:    [id number]       Weight:       266.5 lb Date of Birth:  14-Aug-1981        BSA:  2.407 m Patient Age:    43 years         BP:           154/52 mmHg Patient Gender: M                HR:           109 bpm. Exam Location:  Inpatient Procedure: 2D Echo, Color Doppler and Cardiac Doppler (Both Spectral and Color            Flow Doppler were utilized during procedure). Indications:    I26.02 Pulmonary embolus  History:        Patient has no prior history of Echocardiogram examinations.                 Risk Factors:Hypertension.  Sonographer:    Andrena Bang Referring Phys: [id number] DAVID MANUEL ORTIZ  Sonographer Comments: Pt short of breath during exam. IMPRESSIONS  1. Left ventricular ejection fraction, by estimation, is 70 to 75%. The left ventricle has hyperdynamic function. The left ventricle has no regional wall motion abnormalities. There is moderate concentric left ventricular hypertrophy. Left ventricular diastolic parameters are indeterminate.  2. Right ventricular systolic function is normal. The right ventricular size is normal. Tricuspid regurgitation signal is inadequate for assessing PA pressure.  3. The mitral valve is normal in structure. Trivial mitral valve regurgitation. No evidence of mitral stenosis.  4. The aortic valve is normal in structure. Aortic valve regurgitation is not visualized. No aortic stenosis is present.  5. The inferior vena cava is dilated in size with >50% respiratory variability, suggesting right  atrial pressure of 8 mmHg. FINDINGS  Left Ventricle: Left ventricular ejection fraction, by estimation, is 70 to 75%. The left ventricle has hyperdynamic function. The left ventricle has no regional wall motion abnormalities. Definity  contrast agent was given IV to delineate the left ventricular endocardial borders. The left ventricular internal cavity size was normal in size. There is moderate concentric left ventricular hypertrophy. Left ventricular diastolic parameters are indeterminate. Right Ventricle: The right ventricular size is normal. No increase in right ventricular wall thickness. Right ventricular systolic function is normal. Tricuspid regurgitation signal is inadequate for assessing PA pressure. Left Atrium: Left atrial size was normal in size. Right Atrium: Right atrial size was normal in size. Pericardium: There is no evidence of pericardial effusion. Mitral Valve: The mitral valve is normal in structure. Trivial mitral valve regurgitation. No evidence of mitral valve stenosis. Tricuspid Valve: The tricuspid valve is normal in structure. Tricuspid valve regurgitation is trivial. No evidence of tricuspid stenosis. Aortic Valve: The aortic valve is normal in structure. Aortic valve regurgitation is not visualized. No aortic stenosis is present. Aortic valve mean gradient measures 5.0 mmHg. Aortic valve peak gradient measures 9.2 mmHg. Aortic valve area, by VTI measures 2.84 cm. Pulmonic Valve: The pulmonic valve was normal in structure. Pulmonic valve regurgitation is not visualized. No evidence of pulmonic stenosis. Aorta: The aortic root is normal in size and structure. Venous: The inferior vena cava is dilated in size with greater than 50% respiratory variability, suggesting right atrial pressure of 8 mmHg. IAS/Shunts: No atrial level shunt detected by color flow Doppler.  LEFT VENTRICLE PLAX 2D LVIDd:         4.00 cm      Diastology LVIDs:         2.30 cm      LV e' medial:    6.84 cm/s LV PW:  1.70 cm      LV E/e' medial:  11.2 LV IVS:        1.50 cm      LV e' lateral:   15.70 cm/s LVOT diam:     2.00 cm      LV E/e' lateral: 4.9 LV SV:         67 LV SV Index:   28 LVOT Area:     3.14 cm  LV Volumes (MOD) LV vol d, MOD A2C: 119.0 ml LV vol d, MOD A4C: 104.0 ml LV vol s, MOD A2C: 33.3 ml LV vol s, MOD A4C: 33.5 ml LV SV MOD A2C:     85.7 ml LV SV MOD A4C:     104.0 ml LV SV MOD BP:      79.8 ml RIGHT VENTRICLE RV S prime:     20.50 cm/s TAPSE (M-mode): 2.0 cm LEFT ATRIUM             Index LA Vol (A2C):   17.7 ml 7.35 ml/m LA Vol (A4C):   46.9 ml 19.48 ml/m LA Biplane Vol: 29.4 ml 12.21 ml/m  AORTIC VALVE AV Area (Vmax):    2.50 cm AV Area (Vmean):   2.38 cm AV Area (VTI):     2.84 cm AV Vmax:           152.00 cm/s AV Vmean:          103.000 cm/s AV VTI:            0.237 m AV Peak Grad:      9.2 mmHg AV Mean Grad:      5.0 mmHg LVOT Vmax:         121.00 cm/s LVOT Vmean:        78.000 cm/s LVOT VTI:          0.214 m LVOT/AV VTI ratio: 0.90  AORTA Ao Asc diam: 3.20 cm MITRAL VALVE MV Area (PHT): 4.71 cm    SHUNTS MV Decel Time: 161 msec    Systemic VTI:  0.21 m MV E velocity: 76.30 cm/s  Systemic Diam: 2.00 cm MV A velocity: 67.30 cm/s MV E/A ratio:  1.13 Jules Oar MD Electronically signed by Jules Oar MD Signature Date/Time: 02/01/2024/9:12:39 AM    Final    VAS US  LOWER EXTREMITY VENOUS (DVT) Result Date: 01/31/2024  Lower Venous DVT Study Patient Name:  VIRGINIA FRANCISCO  Date of Exam:   01/31/2024 Medical Rec #: [id number]         Accession #:    [id number] Date of Birth: [birth year]         Patient Gender: M Patient Age:   41 years Exam Location:  [facility] Procedure:      VAS US  LOWER EXTREMITY VENOUS (DVT) Referring Phys: DAVID ORTIZ --------------------------------------------------------------------------------  Indications: Pulmonary embolism.  Risk Factors: Confirmed PE. Anticoagulation: Heparin . Comparison Study: No prior studies. Performing Technologist: Lerry Ransom RVT  Examination Guidelines: A complete evaluation includes B-mode imaging, spectral Doppler, color Doppler, and power Doppler as needed of all accessible portions of each vessel. Bilateral testing is considered an integral part of a complete examination. Limited examinations for reoccurring indications may be performed as noted. The reflux portion of the exam is performed with the patient in reverse Trendelenburg.  +---------+---------------+---------+-----------+----------+--------------+ RIGHT    CompressibilityPhasicitySpontaneityPropertiesThrombus Aging +---------+---------------+---------+-----------+----------+--------------+ CFV      Full           Yes      Yes                                 +---------+---------------+---------+-----------+----------+--------------+  SFJ      Full                                                        +---------+---------------+---------+-----------+----------+--------------+ FV Prox  Full                                                        +---------+---------------+---------+-----------+----------+--------------+ FV Mid   Full                                                        +---------+---------------+---------+-----------+----------+--------------+ FV DistalFull                                                        +---------+---------------+---------+-----------+----------+--------------+ PFV      Full                                                        +---------+---------------+---------+-----------+----------+--------------+ POP      None           No       No                   Acute          +---------+---------------+---------+-----------+----------+--------------+ PTV      Full                                                        +---------+---------------+---------+-----------+----------+--------------+ PERO     None                                         Acute           +---------+---------------+---------+-----------+----------+--------------+ Gastroc  Full                                                        +---------+---------------+---------+-----------+----------+--------------+   +---------+---------------+---------+-----------+----------+--------------+ LEFT     CompressibilityPhasicitySpontaneityPropertiesThrombus Aging +---------+---------------+---------+-----------+----------+--------------+ CFV      Full           Yes      Yes                                 +---------+---------------+---------+-----------+----------+--------------+  SFJ      Full                                                        +---------+---------------+---------+-----------+----------+--------------+ FV Prox  Full                                                        +---------+---------------+---------+-----------+----------+--------------+ FV Mid   Full                                                        +---------+---------------+---------+-----------+----------+--------------+ FV DistalFull                                                        +---------+---------------+---------+-----------+----------+--------------+ PFV      Full                                                        +---------+---------------+---------+-----------+----------+--------------+ POP      Full           Yes      Yes                                 +---------+---------------+---------+-----------+----------+--------------+ PTV      Full                                                        +---------+---------------+---------+-----------+----------+--------------+ PERO     Partial                                      Acute          +---------+---------------+---------+-----------+----------+--------------+     Summary: RIGHT: - Findings consistent with acute deep vein thrombosis involving the right popliteal vein, and right  peroneal veins.  - No cystic structure found in the popliteal fossa.  LEFT: - Findings consistent with acute deep vein thrombosis involving the left peroneal veins.  - No cystic structure found in the popliteal fossa.  *See table(s) above for measurements and observations. Electronically signed by Angela Kell MD on 01/31/2024 at 5:14:49 PM.    Final    CT Angio Chest PE W and/or Wo Contrast Result Date: 01/31/2024 CLINICAL DATA:  Chest pain. Recent hospital admission. Pain is increased with inspiration. EXAM: CT ANGIOGRAPHY CHEST WITH CONTRAST TECHNIQUE: Multidetector CT imaging of the  chest was performed using the standard protocol during bolus administration of intravenous contrast. Multiplanar CT image reconstructions and MIPs were obtained to evaluate the vascular anatomy. RADIATION DOSE REDUCTION: This exam was performed according to the departmental dose-optimization program which includes automated exposure control, adjustment of the mA and/or kV according to patient size and/or use of iterative reconstruction technique. CONTRAST:  75mL OMNIPAQUE  IOHEXOL  350 MG/ML SOLN COMPARISON:  None Available. FINDINGS: Cardiovascular: There is satisfactory opacification of the pulmonary arteries. Thrombus is identified at the bifurcation of the distal right main pulmonary artery, image 65/6. Thrombus extends into the right lower lobe are, segmental and subsegmental pulmonary arteries. Small filling defect is also identified within a segmental branch of the left upper lobe pulmonary artery. The RV to LV ratio is equal to 40.39 mm/38.34 mm = 1.04. This is compatible with mild right heart strain. Coronary artery calcifications. No significant aortic atherosclerotic calcifications. Mediastinum/Nodes: Thyroid gland and trachea are unremarkable. Circumferential wall thickening involving the distal esophagus noted, image 94/6. No enlarged mediastinal or hilar lymph nodes. Lungs/Pleura: Small bilateral pleural effusions. There  is peripheral ground-glass attenuation and mild consolidative change within the right lower lobe compatible with infarct. Areas of subsegmental atelectasis noted in the left base. Upper Abdomen: No acute abnormality. Musculoskeletal: No acute or suspicious osseous findings. Review of the MIP images confirms the above findings. IMPRESSION: 1. Examination is positive for acute pulmonary embolism with thrombus identified at the bifurcation of the distal right main pulmonary artery. Thrombus extends into the right lower lobar, segmental and subsegmental pulmonary arteries. Small filling defect is also identified within a segmental branch of the left upper lobe pulmonary artery. 2. RV to LV ratio is equal to 40.39 mm/38.34 mm = 1.04. This is compatible with mild right heart strain. 3. Small bilateral pleural effusions. 4. Peripheral ground-glass attenuation and mild consolidative change within the right lower lobe compatible with infarct. 5. Circumferential wall thickening involving the distal esophagus. Correlate for any clinical signs or symptoms of esophagitis. 6. Coronary artery calcifications. 7.  Aortic Atherosclerosis (ICD10-I70.0). Critical Value/emergent results were called by telephone at the time of interpretation on 01/31/2024 at 6:41 am to provider Dr. April Palumbo, who verbally acknowledged these results. Electronically Signed   By: Kimberley Penman M.D.   On: 01/31/2024 06:42   DG Chest 2 View Result Date: 01/30/2024 CLINICAL DATA:  Chest pain.  Right-sided chest pain for 24 hours. EXAM: CHEST - 2 VIEW COMPARISON:  01/30/2024 FINDINGS: Shallow inspiration. Cardiac enlargement with mild pulmonary vascular congestion. Small bilateral pleural effusions. No airspace disease or consolidation. No pneumothorax. Mediastinal contours appear intact. IMPRESSION: Cardiac enlargement with pulmonary vascular congestion and small bilateral pleural effusions. No edema or consolidation is identified. Electronically Signed    By: Boyce Byes M.D.   On: 01/30/2024 23:58   DG Chest 2 View Result Date: 01/30/2024 CLINICAL DATA:  Fever EXAM: CHEST - 2 VIEW COMPARISON:  Chest radiograph dated 01/26/2024 FINDINGS: Lines/tubes: Interval extubation and removal of enteric tube. Lungs: Improved lung volumes. Minimal residual patchy left basilar opacities. Pleura: Decreased trace bilateral pleural effusions. No pneumothorax. Heart/mediastinum: The heart size and mediastinal contours are within normal limits. Bones: No acute osseous abnormality. IMPRESSION: 1. Improved lung volumes with minimal residual patchy left basilar opacities, likely atelectasis. 2. Decreased trace bilateral pleural effusions. Electronically Signed   By: Limin  Xu M.D.   On: 01/30/2024 08:41   DG Chest Port 1 View Result Date: 01/26/2024 CLINICAL DATA:  History of endotracheal tube placement EXAM:  PORTABLE CHEST 1 VIEW COMPARISON:  Chest x-ray performed Jan 25, 2024 FINDINGS: Endotracheal tube terminates approximately 3 cm above the carina. An enteric tube is present which courses below the diaphragm. A left-sided approach central venous catheter is present with tip terminating near the anticipated location of the SVC. Low lung volumes. Interstitial airspace opacities, similar. Improvement in basilar aeration on the right. No pneumothorax. IMPRESSION: 1. Similar appearance of life-support devices. 2. Central pulmonary vascular congestion. 3. Improvement in right basilar aeration. Electronically Signed   By: Reagan Camera M.D.   On: 01/26/2024 09:01   Portable Chest xray Result Date: 01/25/2024 CLINICAL DATA:  [address].  Respiratory failure. EXAM: PORTABLE CHEST 1 VIEW COMPARISON:  Portable chest yesterday at 5:46 p.m. FINDINGS: 4:47 a.m. Left IJ central line terminates in the SVC about the azygous confluence. ETT tip is 3.8 cm from carina. NGT has been removed, feeding tube left in place, extending well into the stomach although the radiopaque tip is not filmed. Exam  technically limited by body habitus. There is cardiomegaly with stable mediastinal configuration, perihilar vascular congestion and increased central interstitial edema. There are moderate increasing pleural effusions with worsening atelectasis or consolidation in both lower lung fields. There is increasing left upper lobe perihilar opacity which could be alveolar edema or pneumonia. No other focal upper zonal abnormality is seen. No new osseous findings. No further change. IMPRESSION: 1. Increasing moderate pleural effusions with worsening atelectasis or consolidation in both lower lung fields. 2. Increasing left upper lobe perihilar opacity which could be alveolar edema or pneumonia. 3. Cardiomegaly with perihilar vascular congestion and increased central interstitial edema. 4. Support apparatus as above. Electronically Signed   By: Denman Fischer M.D.   On: 01/25/2024 05:47   DG Abd 1 View Result Date: 01/24/2024 CLINICAL DATA:  NG tube placement EXAM: ABDOMEN - 1 VIEW COMPARISON:  Abdominal radiograph 01/22/2024 FINDINGS: The feeding tube has been retracted slightly with tip now at the distal descending duodenum. IMPRESSION: Feeding tube tip at the distal descending duodenum. Electronically Signed   By: Rozell Cornet M.D.   On: 01/24/2024 20:51   Portable Chest x-ray Result Date: 01/24/2024 CLINICAL DATA:  Intubation and orogastric tube placement EXAM: PORTABLE CHEST 1 VIEW COMPARISON:  01/22/2024 FINDINGS: Mildly degraded exam due to AP portable technique and patient body habitus. Endotracheal tube terminates 3.2 cm above carina. Nasogastric tube terminates in the proximal stomach. The side ports difficult to visualize but likely above the gastroesophageal junction. A feeding tube extends beyond the  inferior aspect of the film. Left internal jugular line tip at high SVC. Numerous leads and wires project over the chest. Cardiomegaly accentuated by AP portable technique. No pleural effusion or  pneumothorax. Suspect asymmetric left-sided mild pulmonary venous congestion. Lateral right lower lobe subpleural pulmonary opacity. Low lung volumes. IMPRESSION: Appropriate position of endotracheal tube. Nasogastric tube terminating in the proximal stomach. This should be advanced for optimal positioning. Cardiomegaly and suspicion of asymmetric pulmonary venous congestion. Right lower lobe subpleural opacity is most likely atelectasis, new since the prior. These results will be called to the ordering clinician or representative by the Radiologist Assistant, and communication documented in the PACS or [employer]. Electronically Signed   By: Lore Rode M.D.   On: 01/24/2024 18:38   VAS US  UPPER EXTREMITY VENOUS DUPLEX Result Date: 01/24/2024 UPPER VENOUS STUDY  Patient Name:  NOMAR BROAD  Date of Exam:   01/23/2024 Medical Rec #: [id number]  Accession #:    [id number] Date of Birth: [birth year]         Patient Gender: M Patient Age:   66 years Exam Location:  [facility] Procedure:      VAS US  UPPER EXTREMITY VENOUS DUPLEX Referring Phys: RAKESH ALVA --------------------------------------------------------------------------------  Indications: Edema Limitations: Constant patient movement, patient in restraints. Comparison Study: No previous exams Performing Technologist: Jody Hill RVT, RDMS  Examination Guidelines: A complete evaluation includes B-mode imaging, spectral Doppler, color Doppler, and power Doppler as needed of all accessible portions of each vessel. Bilateral testing is considered an integral part of a complete examination. Limited examinations for reoccurring indications may be performed as noted.  Right Findings: +----------+------------+---------+-----------+----------+--------------+ RIGHT     CompressiblePhasicitySpontaneousProperties   Summary     +----------+------------+---------+-----------+----------+--------------+ IJV           Full       Yes        Yes                             +----------+------------+---------+-----------+----------+--------------+ Subclavian    Full       Yes       Yes                             +----------+------------+---------+-----------+----------+--------------+ Axillary      Full       Yes       Yes                             +----------+------------+---------+-----------+----------+--------------+ Brachial      Full       Yes       Yes                             +----------+------------+---------+-----------+----------+--------------+ Radial        Full                                                 +----------+------------+---------+-----------+----------+--------------+ Ulnar         Full                                                 +----------+------------+---------+-----------+----------+--------------+ Cephalic                                            Not visualized +----------+------------+---------+-----------+----------+--------------+ Basilic       None       No        No                   Acute      +----------+------------+---------+-----------+----------+--------------+  Left Findings: +----------+------------+---------+-----------+----------+--------------+ LEFT      CompressiblePhasicitySpontaneousProperties   Summary     +----------+------------+---------+-----------+----------+--------------+ IJV  Not visualized +----------+------------+---------+-----------+----------+--------------+ Subclavian    Full       Yes       Yes                             +----------+------------+---------+-----------+----------+--------------+ Axillary      Full       Yes       Yes                             +----------+------------+---------+-----------+----------+--------------+ Brachial      Full       Yes       Yes                              +----------+------------+---------+-----------+----------+--------------+ Radial        Full                                                 +----------+------------+---------+-----------+----------+--------------+ Ulnar         Full                                                 +----------+------------+---------+-----------+----------+--------------+ Cephalic      None       No        No                   Acute      +----------+------------+---------+-----------+----------+--------------+ Basilic       None       No        No                   Acute      +----------+------------+---------+-----------+----------+--------------+  Summary:  Right: No evidence of deep vein thrombosis in the upper extremity. Findings consistent with acute superficial vein thrombosis involving the right basilic vein. However, unable to visualize the cephalic vein.  Left: No evidence of deep vein thrombosis in the upper extremity. Findings consistent with acute superficial vein thrombosis involving the left basilic vein and left cephalic vein. However, unable to visualize the IJV.  *See table(s) above for measurements and observations.  Diagnosing physician: Runell Countryman Electronically signed by Runell Countryman on 01/24/2024 at 10:11:18 AM.    Final    DG CHEST PORT 1 VIEW Result Date: 01/22/2024 CLINICAL DATA:  PICC line placement.  Central line placement. EXAM: PORTABLE CHEST 1 VIEW COMPARISON:  01/22/2024 at 8:04 a.m. FINDINGS: Endotracheal tube has tip 4 cm above the carina. PH probe noted over the mid to distal esophagus. Nasogastric tube and enteric tube courses into the stomach and off the film as tips are not visualized. Interval placement of left IJ central venous catheter with tip obliquely oriented over the region of the SVC. Lungs are hypoinflated without lobar consolidation or effusion. No evidence of pneumothorax. Cardiomediastinal silhouette and remainder of the exam is unchanged. IMPRESSION:  1. Interval placement of left IJ central venous catheter with tip obliquely oriented over the region of the SVC. No evidence of pneumothorax. 2. Hypoinflation without acute cardiopulmonary disease. Electronically Signed  By: Roda Cirri M.D.   On: 01/22/2024 18:03   US  EKG SITE RITE Result Date: 01/22/2024 If Site Rite image not attached, placement could not be confirmed due to current cardiac rhythm.  DG CHEST PORT 1 VIEW Result Date: 01/22/2024 CLINICAL DATA:  Intubation EXAM: PORTABLE CHEST 1 VIEW COMPARISON:  Yesterday FINDINGS: Endotracheal tube with tip difficult to define given underpenetration. No tube seen below the carina. Esophageal thermistor and enteric/gastric section tubes as described on abdominal radiograph. Low volume chest with hazy atelectatic type density. No effusion or pneumothorax. Accentuated heart size from technique. IMPRESSION: The endotracheal tube tip is difficult to visualize given underpenetration. No tube seen below the carina. Low volume chest with mild atelectasis. Electronically Signed   By: Ronnette Coke M.D.   On: 01/22/2024 08:29   DG Abd Portable 1V Result Date: 01/22/2024 CLINICAL DATA:  Confirm orogastric tube EXAM: PORTABLE ABDOMEN - 1 VIEW COMPARISON:  Two days ago FINDINGS: Feeding tube with tip at the distal duodenum. Enteric tube with tip at the distal stomach. Normal bowel gas pattern. IMPRESSION: Feeding tube with tip at the distal duodenum. Gastric tube with tip at the distal stomach. Electronically Signed   By: Ronnette Coke M.D.   On: 01/22/2024 08:27   DG Chest Port 1 View Result Date: 01/21/2024 CLINICAL DATA:  Acute respiratory failure. EXAM: PORTABLE CHEST 1 VIEW COMPARISON:  01/19/2024 FINDINGS: Enteric tube, weighted enteric tube and non weighted enteric tubes remain in place unchanged in position. Low lung volumes persist. Patchy basilar airspace disease persists with mild improvement. No progressive consolidation. Stable heart size and  mediastinal contours. No pneumothorax or significant pleural effusion. IMPRESSION: 1. Persistent low lung volumes with patchy basilar airspace disease, mildly improved. 2. Stable support apparatus. Electronically Signed   By: Chadwick Colonel M.D.   On: 01/21/2024 11:26   DG Abd Portable 1V Result Date: 01/20/2024 CLINICAL DATA:  Abdominal distention. EXAM: PORTABLE ABDOMEN - 1 VIEW COMPARISON:  01/19/2024 FINDINGS: Normal bowel-gas pattern. Feeding tube tip in the distal 3rd portion of the duodenum. Nasogastric tube tip in the proximal 2nd portion of the duodenum. Right hip prosthesis. IMPRESSION: 1. Normal bowel-gas pattern. 2. Feeding tube tip in the distal 3rd portion of the duodenum. 3. Nasogastric tube tip in the proximal 2nd portion of the duodenum. Electronically Signed   By: Catherin Closs M.D.   On: 01/20/2024 10:18   DG Abd 1 View Result Date: 01/19/2024 CLINICAL DATA:  NG tube placement EXAM: ABDOMEN - 1 VIEW COMPARISON:  01/18/2024 FINDINGS: Two feeding tubes are seen, 1 terminating in the distal gastric body and the other, a Dobbhoff tube, extending to the region of the distal duodenum. No dilated loops of bowel identified in the visualized abdomen. IMPRESSION: Two feeding tubes, 1 terminating in the region of the distal gastric body and the other extending to the region of the distal duodenum. Electronically Signed   By: Elester Grim M.D.   On: 01/19/2024 16:50   US  Abdomen Limited RUQ (LIVER/GB) Result Date: 01/19/2024 CLINICAL DATA:  Bile acid esophageal reflux EXAM: ULTRASOUND ABDOMEN LIMITED RIGHT UPPER QUADRANT COMPARISON:  08/10/2023 FINDINGS: Gallbladder: Gallstones: None Sludge: None Gallbladder Wall: Within normal limits Pericholecystic fluid: None Sonographic Murphy's Sign: Negative per technologist Common bile duct: Diameter: 3 mm Liver: Parenchymal echogenicity: Homogeneously increased Contours: Normal Lesions: None Portal vein: Patent.  Hepatopetal flow Other: None. IMPRESSION:  Diffuse increased echogenicity of the hepatic parenchyma is a nonspecific indicator of hepatocellular dysfunction, most commonly steatosis. Electronically Signed  By: Luann Rundle  Mir M.D.   On: 01/19/2024 16:48   DG CHEST PORT 1 VIEW Result Date: 01/19/2024 CLINICAL DATA:  [address] Emesis [id number] EXAM: PORTABLE CHEST - 1 VIEW COMPARISON:  Jan 18, 2024 FINDINGS: Endotracheal tube terminates in the mid trachea. Patchy airspace opacities in the lung bases. No pleural effusion or pneumothorax. The cardiac silhouette is at the upper limits of normal, likely accentuated by AP technique and low lung volumes. Weighted feeding tube courses below the diaphragm with the distal tip not included in the field of view. IMPRESSION: 1. Patchy airspace opacities in the lung bases, which may represent multifocal pneumonia or aspiration. 2. Similarly well-positioned endotracheal tube. Electronically Signed   By: Rance Burrows M.D.   On: 01/19/2024 15:43   DG Abd 1 View Result Date: 01/18/2024 CLINICAL DATA:  NG placement. EXAM: ABDOMEN - 1 VIEW COMPARISON:  Abdominal radiograph dated 01/18/2024. FINDINGS: Feeding tube with tip in the region of the second portion of the duodenum. Air is noted in the stomach. No acute osseous pathology. IMPRESSION: Feeding tube with tip in the region of the second portion of the duodenum. Electronically Signed   By: Angus Bark M.D.   On: 01/18/2024 13:57   DG Abd Portable 1 View Result Date: 01/18/2024 CLINICAL DATA:  Nasogastric tube placement EXAM: PORTABLE ABDOMEN - 1 VIEW COMPARISON:  None Available. FINDINGS: Nasogastric tube tip overlies the expected distal esophagus at least 15 cm above the gastroesophageal junction. The abdominal gas pattern is indeterminate due to a paucity of intra-abdominal gas. IMPRESSION: 1. Nasogastric tube tip within the distal esophagus at least 15 cm above the gastroesophageal junction. Advancement of the catheter by 20-25 cm would optimally position the  device. Electronically Signed   By: Worthy Heads M.D.   On: 01/18/2024 05:54   DG Abd Portable 1V Result Date: 01/18/2024 CLINICAL DATA:  Nasogastric tube placement EXAM: PORTABLE ABDOMEN - 1 VIEW COMPARISON:  Earlier the same day FINDINGS: No visible enteric tube. The epigastrium is incompletely covered. Moderate gaseous distention of the stomach. IMPRESSION: No visible enteric tube. The epigastrium is incompletely covered. Moderate gaseous distention of the stomach. Electronically Signed   By: Ronnette Coke M.D.   On: 01/18/2024 04:52   DG Chest Portable 1 View Result Date: 01/18/2024 CLINICAL DATA:  Check endotracheal tube placement EXAM: PORTABLE CHEST 1 VIEW COMPARISON:  01/17/2024 FINDINGS: Cardiac shadow is enlarged but accentuated by the frontal technique. Endotracheal tube is noted in satisfactory position. The lungs are hypoinflated but clear. IMPRESSION: Tubes and lines as described. Electronically Signed   By: Violeta Grey M.D.   On: 01/18/2024 02:55   DG Abd Portable 1V Result Date: 01/18/2024 CLINICAL DATA:  Check gastric catheter placement EXAM: PORTABLE ABDOMEN - 1 VIEW COMPARISON:  None Available. FINDINGS: Gastric catheter is noted in the distal esophagus. This should be advanced several cm deeper into the stomach. No obstructive changes are seen. IMPRESSION: Gastric catheter in the distal esophagus. This should be advanced several cm into the abdomen. Electronically Signed   By: Violeta Grey M.D.   On: 01/18/2024 02:54   CT HEAD WO CONTRAST ( ) Result Date: 01/17/2024 CLINICAL DATA:  Altered mental status EXAM: CT HEAD WITHOUT CONTRAST TECHNIQUE: Contiguous axial images were obtained from the base of the skull through the vertex without intravenous contrast. RADIATION DOSE REDUCTION: This exam was performed according to the departmental dose-optimization program which includes automated exposure control, adjustment of the mA and/or kV according to patient size and/or use of  iterative reconstruction technique. COMPARISON:  11/28/2022 FINDINGS: Brain: No acute intracranial abnormality. Specifically, no hemorrhage, hydrocephalus, mass lesion, acute infarction, or significant intracranial injury. Vascular: No hyperdense vessel or unexpected calcification. Skull: No acute calvarial abnormality. Sinuses/Orbits: No acute findings Other: None IMPRESSION: No acute intracranial abnormality. Electronically Signed   By: Janeece Mechanic M.D.   On: 01/17/2024 23:35   DG Chest Portable 1 View Result Date: 01/17/2024 CLINICAL DATA:  Altered mental status EXAM: PORTABLE CHEST 1 VIEW COMPARISON:  08/11/2023 FINDINGS: Cardiac shadow is enlarged. The lungs are hypoinflated but clear. No bony abnormality is noted. IMPRESSION: No acute abnormality noted. Electronically Signed   By: Violeta Grey M.D.   On: 01/17/2024 23:08    Microbiology: Results for orders placed or performed during the hospital encounter of 02/07/24  Blood Culture (routine x 2)     Status: None (Preliminary result)   Collection Time: 02/07/24  9:30 PM   Specimen: BLOOD LEFT ARM  Result Value Ref Range Status   Specimen Description   Final    BLOOD LEFT ARM Performed at [facility], [address] [place], [place] [address]    Special Requests   Final    BOTTLES DRAWN AEROBIC AND ANAEROBIC Blood Culture results may not be optimal due to an inadequate volume of blood received in culture bottles Performed at [facility], [address] [place], [place] [address]    Culture   Final    NO GROWTH 4 DAYS Performed at [facility], [address], [place], [place] [address]    Report Status PENDING  Incomplete  Resp panel by RT-PCR (RSV, Flu A&B, Covid) Nasal Mucosa     Status: None   Collection Time: 02/07/24 10:04 PM   Specimen: Nasal Mucosa; Nasal Swab  Result Value Ref Range Status   SARS Coronavirus 2 by RT PCR NEGATIVE NEGATIVE Final    Comment: (NOTE) SARS-CoV-2  target nucleic acids are NOT DETECTED.  The SARS-CoV-2 RNA is generally detectable in upper respiratory specimens during the acute phase of infection. The lowest concentration of SARS-CoV-2 viral copies this assay can detect is 138 copies/mL. A negative result does not preclude SARS-Cov-2 infection and should not be used as the sole basis for treatment or other patient management decisions. A negative result may occur with  improper specimen collection/handling, submission of specimen other than nasopharyngeal swab, presence of viral mutation(s) within the areas targeted by this assay, and inadequate number of viral copies(<138 copies/mL). A negative result must be combined with clinical observations, patient history, and epidemiological information. The expected result is Negative.  Fact Sheet for Patients:  BloggerCourse.com  Fact Sheet for Healthcare Providers:  SeriousBroker.it  This test is no t yet approved or cleared by the [place]  FDA and  has been authorized for detection and/or diagnosis of SARS-CoV-2 by FDA under an Emergency Use Authorization (EUA). This EUA will remain  in effect (meaning this test can be used) for the duration of the COVID-19 declaration under Section 564(b)(1) of the Act, 21 U.S.C.section 360bbb-3(b)(1), unless the authorization is terminated  or revoked sooner.       Influenza A by PCR NEGATIVE NEGATIVE Final   Influenza B by PCR NEGATIVE NEGATIVE Final    Comment: (NOTE) The Xpert Xpress SARS-CoV-2/FLU/RSV plus assay is intended as an aid in the diagnosis of influenza from Nasopharyngeal swab specimens and should not be used as a sole basis for treatment. Nasal washings and aspirates are unacceptable for  Xpert Xpress SARS-CoV-2/FLU/RSV testing.  Fact Sheet for Patients: BloggerCourse.com  Fact Sheet for Healthcare  Providers: SeriousBroker.it  This test is not yet approved or cleared by the [place]  FDA and has been authorized for detection and/or diagnosis of SARS-CoV-2 by FDA under an Emergency Use Authorization (EUA). This EUA will remain in effect (meaning this test can be used) for the duration of the COVID-19 declaration under Section 564(b)(1) of the Act, 21 U.S.C. section 360bbb-3(b)(1), unless the authorization is terminated or revoked.     Resp Syncytial Virus by PCR NEGATIVE NEGATIVE Final    Comment: (NOTE) Fact Sheet for Patients: BloggerCourse.com  Fact Sheet for Healthcare Providers: SeriousBroker.it  This test is not yet approved or cleared by the [place]  FDA and has been authorized for detection and/or diagnosis of SARS-CoV-2 by FDA under an Emergency Use Authorization (EUA). This EUA will remain in effect (meaning this test can be used) for the duration of the COVID-19 declaration under Section 564(b)(1) of the Act, 21 U.S.C. section 360bbb-3(b)(1), unless the authorization is terminated or revoked.  Performed at [facility], [address], [place], [place] [address]   MRSA Next Gen by PCR, Nasal     Status: None   Collection Time: 02/07/24 10:05 PM   Specimen: Nasal Mucosa; Nasal Swab  Result Value Ref Range Status   MRSA by PCR Next Gen NOT DETECTED NOT DETECTED Final    Comment: (NOTE) The GeneXpert MRSA Assay (FDA approved for NASAL specimens only), is one component of a comprehensive MRSA colonization surveillance program. It is not intended to diagnose MRSA infection nor to guide or monitor treatment for MRSA infections. Test performance is not FDA approved in patients less than 60 years old. Performed at [facility], [address], [place], [place] [address]   Blood Culture (routine x 2)     Status: None (Preliminary result)    Collection Time: 02/08/24 12:41 AM   Specimen: BLOOD RIGHT ARM  Result Value Ref Range Status   Specimen Description   Final    BLOOD RIGHT ARM Performed at [facility], [address] [place], [place] [address]    Special Requests   Final    BOTTLES DRAWN AEROBIC AND ANAEROBIC Blood Culture results may not be optimal due to an inadequate volume of blood received in culture bottles Performed at [facility], [address] [place], [place] [address]    Culture   Final    NO GROWTH 3 DAYS Performed at [facility], [address], [place], [place] [address]    Report Status PENDING  Incomplete   Labs: CBC: Recent Labs  Lab 02/07/24 1933 02/08/24 0041 02/08/24 0519 02/09/24 0526  WBC 10.5 10.4 10.0 8.1  NEUTROABS  --  6.2  --  5.1  HGB 10.8* 10.9* 11.4* 10.8*  HCT 33.9* 33.5* 34.5* 34.1*  MCV 91.4 91.0 89.8 93.2  PLT 614* 604* 556* 461*   Basic Metabolic Panel: Recent Labs  Lab 02/07/24 1933 02/07/24 2130 02/08/24 1039 02/09/24 0526  NA 131*  --  134* 134*  K 4.2  --  3.6 4.3  CL 98  --  102 103  CO2 19*  --  21* 22  GLUCOSE 92  --  97 106*  BUN 15  --  14 12  CREATININE 1.08  --  0.82 0.84  CALCIUM  8.9  --  9.0 8.7*  MG  --  2.3 2.2 2.1  PHOS  --  4.6 4.5 4.8*   Liver Function Tests: Recent Labs  Lab 02/07/24 2130 02/08/24 1039 02/09/24 0526  AST 49* 43* 34  ALT 138* 117* 94*  ALKPHOS 291* 254* 223*  BILITOT 0.7 0.6 0.7  PROT 7.6 6.7 6.4*  ALBUMIN 3.2* 3.0* 2.9*   CBG: No results for input(s): GLUCAP in the last 168 hours.  Discharge time spent: greater than 30 minutes.  Signed: Aura Leeds, DO [facility] 02/11/2024

## 2024-02-09 NOTE — Progress Notes (Signed)
 SATURATION QUALIFICATIONS: (This note is used to comply with regulatory documentation for home oxygen)  Patient Saturations on Room Air at Rest = 98%  Patient Saturations on Room Air while Ambulating = 96%  Patient Saturations on 0 Liters of oxygen while Ambulating = 96%  Please briefly explain why patient needs home oxygen: patient does not need home oxygen

## 2024-02-12 LAB — CULTURE, BLOOD (ROUTINE X 2): Culture: NO GROWTH

## 2024-02-13 LAB — CULTURE, BLOOD (ROUTINE X 2): Culture: NO GROWTH

## 2024-02-14 LAB — URINE DRUGS OF ABUSE SCREEN W ALC, ROUTINE (REF LAB)
Amphetamines, Urine: NEGATIVE ng/mL
Benzodiazepine Quant, Ur: NEGATIVE ng/mL
Cannabinoid Quant, Ur: NEGATIVE ng/mL
Cocaine (Metab.): NEGATIVE ng/mL
Creatinine, Urine: 57.7 mg/dL (ref 20.0–300.0)
Ethanol U, Quan: NEGATIVE %
Methadone Screen, Urine: NEGATIVE ng/mL
Nitrite Urine, Quantitative: NEGATIVE ug/mL
OPIATE SCREEN URINE: NEGATIVE ng/mL
Phencyclidine, Ur: NEGATIVE ng/mL
Propoxyphene, Urine: NEGATIVE ng/mL
pH, Urine: 5.5 (ref 4.5–8.9)

## 2024-02-14 LAB — DRUG PROFILE 799023
Amobarbital, Ur: NEGATIVE
BARBITURATES: POSITIVE — AB
Butalbital, Ur: NEGATIVE
Pentobarbital, Ur: NEGATIVE
Phenobarbital, GC/MS: 900 ng/mL
Phenobarbital,Ur: POSITIVE — AB
Secobarbital, Ur: NEGATIVE

## 2024-03-06 NOTE — Progress Notes (Signed)
 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 [employer]   referred to pulmonary clinic 03/09/2024 by PCCM service  / seen by PCCM service as inpt:    Admit date:     02/07/2024  Discharge date: 02/09/2024   Recommendations at discharge:  Follow-up with PCP within 1 to 2 weeks repeat CBC, CMP, mag, Phos within 1 week Follow-up with pulmonary in outpatient setting within 4 to 6 weeks for pulmonary evaluation follow-up   Discharge Diagnoses: Principal Problem:   HCAP (healthcare-associated pneumonia)   Pulmonary embolism (HCC)   Alcoholism (HCC)   Elevated liver enzymes   Essential hypertension   Class 3 obesity   Acute bilateral deep vein thrombosis (DVT) of popliteal veins (HCC)   Severe sepsis (HCC)   Hyponatremia   CAP (community acquired pneumonia)   Hospital Course: Patient is a 43 year old Caucasian male with past medical history significant for recent PE and DVTs as well as alcohol use disorder who presented with complaints of low blood pressure and feeling weak.  He was recently discharged from the hospital on 02/04/2024 after severe alcohol withdrawals which required intubation and complication of serotonin syndrome.  He has been hospitalized multiple times last week and was admitted on 6 3 2  through 25 for chest pain shortness breath and found of acute PE with right mild right strain and was discharged home on 6 7.  He went to hotel and been taking medications and was going to go to a substance abuse rehab facility but there was found to have low blood pressure and felt dizzy so he was sent to the hospital for further evaluation.  Patient continues to have some chest discomfort which is worse with deep inspiration and has been coughing.  States that his last drink was prior to admission.  Admitted for further evaluation found to have a questionable pneumonia and hypotension which is being treated.   Assessment  and Plan:  SIRS from? HCAP (healthcare-associated pneumonia): Sepsis appears to be ruled out and he did not receive a 30 cc/kg.  Initially was hypotensive and has a questionable pneumonia but has a known PE.  Patient presenting with  productive cough, fever  and infiltrate in lower lobe on chest x-ray-Infiltrate on CXR and 2-3 characteristics (fever, leukocytosis, purulent sputum) are consistent with pneumonia however he has been recently diagnosed with a PE in a similar location.  Currently has been admitted and treated for HCAP and has now been added on cefepime  and doxycycline .  Sputum cultures are pending and blood cultures are pending.  Streptococcal pneumo UA antigen is negative and urine Legionella antigen is negative.  Did not desaturate on amatory home O2 screen continue with empiric antibiotics for now and de-escalate antibiotics; repeat chest x-ray yesterday showed Stable right basilar opacity concerning for pneumonia.  Will continue antibiotics for now and have outpatient follow-up as he is afebrile, has no leukocytosis   Acute bilateral deep vein thrombosis (DVT) of popliteal veins (HCC): Continue Apixaban  10 mg po BID   Alcoholism (HCC) with concern for withdrawal: Order CIWA protocol , monitor for withdrawal and subsequently will add the every as needed order set. Transitional care coordinator consult.  He is doing stable from a CIWA protocol and his CIWA scores were low   Abnormal LFTs/Elevated liver enzymes: In the setting of EToH abuse and slowly improving. AST went from 49 -> 43 and ALT  went from 138 -> 117. CTM and Trend and repeat CMP in the AM Spoke to pt regarding continuing to avoid [facility]   Acquired hypothyroidism: Check TSH in the AM and C/w levothyroxine    Essential Hypertension: Hold Propranolol  given Hypotension; hypotension is improved and will resume propranolol  discharge   Pulmonary Embolism [facility]): C/w Apixaban  10 mg po BID x 7 days then 5 mg po BID.  Has follow-up with  pulmonary outpatient setting   Hyponatremia: In the setting of EToH abuse. Na+ is now 134. Obtain urine electrolytes Given hypotension will rehydrate. CTM and Trend and repeat CMP in the AM   Normocytic Anemia: Hgb/Hct went from 10.9/33.5 -> 11.4/34.5 and is now 10.8/34.1. Checked Anemia Panel showed an iron level of 40, UIBC of 306, TIBC of 346, saturation ration of 12%, ferritin level of 292, folate level 11.0, and vitamin B12 level 259 CTM for S/Sx of Bleeding; No overt bleeding noted. Repeat CBC in the AM    Thrombocytosis: Improving. Platelet Count went from 604 -> 556 is now trended down to 461. CTM and Trend and repeat CBC in the AM   Gout: Continue with Allopurinol  300 po Daily    Hypoalbuminemia: Patient's Albumin Level has now gone from 3.2 -> 3.0 and is now 2.9. CTM and Trend and repeat CMP in the AM  Class II Obesity: Complicates overall prognosis and care. Estimated body mass index is 36.57 kg/m as calculated from the following:   Height as of this encounter: 5' 10 (1.778 m).   Weight as of this encounter: 115.6 kg. Weight Loss and Dietary Counseling given      History of Present Illness  03/09/2024  Pulmonary/ 1st office eval/Juan Clarke maint on Eliquis  Chief Complaint  Patient presents with   Follow-up    Hospital f/u  Dyspnea:  Not limited by breathing from desired activities-  not doing anything aerobic  Cough: minimal  Sleep: no resp cc / some snoring reported but no excess drowsiness  SABA use: none  02 ldz:wnwz     No obvious day to day or daytime pattern/variability or assoc excess/ purulent sputum or mucus plugs or hemoptysis or cp or chest tightness, subjective wheeze or overt sinus or hb symptoms.    Also denies any obvious fluctuation of symptoms with weather or environmental changes or other aggravating or alleviating factors except as outlined above   No unusual exposure hx or h/o childhood pna/ asthma or knowledge of premature birth.  Current Allergies,  Complete Past Medical History, Past Surgical History, Family History, and Social History were reviewed in [employer] record.  ROS  The following are not active complaints unless bolded Hoarseness, sore throat, dysphagia, dental problems, itching, sneezing,  nasal congestion or discharge of excess mucus or purulent secretions, ear ache,   fever, chills, sweats, unintended wt loss or wt gain, classically pleuritic or exertional cp,  orthopnea pnd or arm/hand swelling  or leg swelling, presyncope, palpitations, abdominal pain, anorexia, nausea, vomiting, diarrhea  or change in bowel habits or change in bladder habits, change in stools or change in urine, dysuria, hematuria,  rash, arthralgias, visual complaints, headache, numbness, weakness or ataxia or problems with walking or coordination,  change in mood or  memory.             Outpatient Medications Prior to Visit  Medication Sig Dispense Refill   acamprosate (CAMPRAL) 333 MG tablet Take 666 mg by mouth 3 (three) times daily.     ASPERCREME LIDOCAINE  4 %  CREA Apply topically.     Melatonin 5 MG TBDP SMARTSIG:1 Tablet(s) By Mouth Every Evening     methocarbamol  (ROBAXIN ) 500 MG tablet Take 500 mg by mouth.     rOPINIRole (REQUIP) 0.5 MG tablet Take 0.5 mg by mouth daily.     acetaminophen  (TYLENOL ) 325 MG tablet Take 325-650 mg by mouth every 6 (six) hours as needed for mild pain (pain score 1-3) or headache.     allopurinol  (ZYLOPRIM ) 300 MG tablet Take 300 mg by mouth daily.     APIXABAN  (ELIQUIS ) VTE STARTER PACK (10MG  AND 5MG ) Take as directed on package: start with two-5mg  tablets twice daily for 7 days. On day 8, switch to one-5mg  tablet twice daily. 74 each 0   atorvastatin  (LIPITOR) 20 MG tablet Take 20 mg by mouth daily.     DULoxetine  (CYMBALTA ) 60 MG capsule Take 120 mg by mouth daily.     folic acid  (FOLVITE ) 1 MG tablet Take 1 tablet (1 mg total) by mouth daily. 30 tablet 0   levothyroxine  (SYNTHROID ) 75 MCG  tablet Take 75 mcg by mouth daily before breakfast.     lisinopril -hydrochlorothiazide  (ZESTORETIC ) 20-25 MG tablet Take 1 tablet by mouth daily.     Multiple Vitamin (MULTIVITAMIN WITH MINERALS) TABS tablet Take 1 tablet by mouth daily. 30 tablet 0   oxyCODONE  (ROXICODONE ) 5 MG immediate release tablet Take 1 tablet (5 mg total) by mouth every 6 (six) hours as needed. 20 tablet 0   PEPCID  COMPLETE [phone] MG chewable tablet Chew 1 tablet by mouth daily as needed (for heartburn or reflux).     propranolol  (INDERAL ) 20 MG tablet Take 20 mg by mouth 3 (three) times daily as needed (Anxiety).     thiamine  (VITAMIN B-1) 100 MG tablet Take 1 tablet (100 mg total) by mouth daily. 30 tablet 0   traZODone  (DESYREL ) 50 MG tablet Take 50 mg by mouth at bedtime as needed for sleep.     lisinopril  (ZESTRIL ) 20 MG tablet Take 1 tablet (20 mg total) by mouth daily. 30 tablet 11   No facility-administered medications prior to visit.    Past Medical History:  Diagnosis Date   GAD (generalized anxiety disorder)    HTN (hypertension)    Insomnia    Migraine without aura    Overweight    Trigeminal neuralgia       Objective:     BP (!) 152/100   Pulse 80   Ht 5' 10 (1.778 m)   Wt 271 lb 3.2 oz (123 kg)   SpO2 97% Comment: RA  BMI 38.91 kg/m   SpO2: 97 % (RA)    - BP up -  note has not started the lisinopril  back again yet.> plans to do so today.  Pleasant amb wm nad    HEENT : Oropharynx  clear      Nasal turbinates nl    NECK :  without  apparent JVD/ palpable Nodes/TM    LUNGS: no acc muscle use,  Nl contour chest which is clear to A and P bilaterally without cough on insp or exp maneuvers   CV:  RRR  no s3 or murmur or increase in P2, and no edema   ABD:  soft and nontender   MS:  Gait nl   ext warm without deformities Or obvious joint restrictions  calf tenderness, cyanosis or clubbing    SKIN: warm and dry without lesions    NEURO:  alert, approp, nl sensorium with  no  motor or cerebellar deficits apparent.       Assessment   Pulmonary embolism (HCC) Dx  01/31/24 p prolonged acute illness ethoh w/d requiting ET  - assoc with bilateral LE DVT > rx Eliquis  x 6 m min and recheck venous dopplers before stopping or reducing rx for eliquis  for any reason - Hematology consultation requested 03/09/2024 >>>  No apparent sequelae now on approp rx x one month with the main issues being when to stop the eliquis  vs change to a prophylactic dose best addressed by hematology withy hypercoagulable w/u of their chosing in terms of timing.   Pulmonary f/u can be in 3 m unless hematology sees in meantime and assumes the management of the eliquis .  Discussed in detail all the  indications, usual  risks and alternatives  relative to the benefits with patient who agrees to proceed with Rx as outlined.             Each maintenance medication was reviewed in detail including emphasizing most importantly the difference between maintenance and prns and under what circumstances the prns are to be triggered using an action plan format where appropriate.  Total time for H and P, chart review, counseling,  and generating customized AVS unique to this office visit / same day charting =  40  min post hosp pt new to me           Ozell America, MD 03/09/2024

## 2024-03-09 ENCOUNTER — Encounter: Payer: Self-pay | Admitting: Internal Medicine

## 2024-03-09 ENCOUNTER — Ambulatory Visit (INDEPENDENT_AMBULATORY_CARE_PROVIDER_SITE_OTHER): Admitting: Internal Medicine

## 2024-03-09 VITALS — BP 152/100 | HR 80 | Ht 70.0 in | Wt 271.2 lb

## 2024-03-09 DIAGNOSIS — I2699 Other pulmonary embolism without acute cor pulmonale: Secondary | ICD-10-CM

## 2024-03-09 MED ORDER — APIXABAN 5 MG PO TABS: 5.0000 mg | ORAL_TABLET | Freq: Two times a day (BID) | ORAL | 2 refills | Status: DC

## 2024-03-09 NOTE — Patient Instructions (Addendum)
 Continue Eliquis  5 mg twice daily for now   My office will be contacting you by phone for referral to Hematology   - if you don't hear back from my office within one week please call us  back or notify us  thru MyChart and we'll address it right away.   You will need a repeat venous doppler (ultrasound) of both legs before you stop the eliquis   Please schedule a follow up visit in 3 months but call sooner if needed

## 2024-03-09 NOTE — Assessment & Plan Note (Addendum)
 Dx  01/31/24 p prolonged acute illness ethoh w/d requiting ET  - assoc with bilateral LE DVT > rx Eliquis  x 6 m min and recheck venous dopplers before stopping or reducing rx for eliquis  for any reason - Hematology consultation requested 03/09/2024 >>>  No apparent sequelae now on approp rx x one month with the main issues being when to stop the eliquis  vs change to a prophylactic dose best addressed by hematology withy hypercoagulable w/u of their chosing in terms of timing.   Pulmonary f/u can be in 3 m unless hematology sees in meantime and assumes the management of the eliquis .  Discussed in detail all the  indications, usual  risks and alternatives  relative to the benefits with patient who agrees to proceed with Rx as outlined.             Each maintenance medication was reviewed in detail including emphasizing most importantly the difference between maintenance and prns and under what circumstances the prns are to be triggered using an action plan format where appropriate.  Total time for H and P, chart review, counseling,  and generating customized AVS unique to this office visit / same day charting =  40  min post hosp pt new to me

## 2024-03-12 ENCOUNTER — Encounter (HOSPITAL_COMMUNITY): Payer: Self-pay

## 2024-03-12 ENCOUNTER — Other Ambulatory Visit: Payer: Self-pay

## 2024-03-12 ENCOUNTER — Emergency Department (HOSPITAL_COMMUNITY)
Admission: EM | Admit: 2024-03-12 | Discharge: 2024-03-13 | Disposition: A | Discharge status: Home / Self Care | Attending: Emergency Medicine | Admitting: Emergency Medicine

## 2024-03-12 DIAGNOSIS — Z7901 Long term (current) use of anticoagulants: Secondary | ICD-10-CM | POA: Insufficient documentation

## 2024-03-12 DIAGNOSIS — F10230 Alcohol dependence with withdrawal, uncomplicated: Secondary | ICD-10-CM | POA: Insufficient documentation

## 2024-03-12 DIAGNOSIS — R000 Tachycardia, unspecified: Secondary | ICD-10-CM | POA: Insufficient documentation

## 2024-03-12 DIAGNOSIS — Y906 Blood alcohol level of 120-199 mg/100 ml: Secondary | ICD-10-CM | POA: Diagnosis not present

## 2024-03-12 DIAGNOSIS — F10930 Alcohol use, unspecified with withdrawal, uncomplicated: Secondary | ICD-10-CM

## 2024-03-12 LAB — CBC WITH DIFFERENTIAL/PLATELET
Abs Immature Granulocytes: 0.05 K/uL (ref 0.00–0.07)
Basophils Absolute: 0.2 K/uL — ABNORMAL HIGH (ref 0.0–0.1)
Basophils Relative: 2 %
Eosinophils Absolute: 0.4 K/uL (ref 0.0–0.5)
Eosinophils Relative: 3 %
HCT: 47.8 % (ref 39.0–52.0)
Hemoglobin: 15.9 g/dL (ref 13.0–17.0)
Immature Granulocytes: 0 %
Lymphocytes Relative: 35 %
Lymphs Abs: 3.9 K/uL (ref 0.7–4.0)
MCH: 29.2 pg (ref 26.0–34.0)
MCHC: 33.3 g/dL (ref 30.0–36.0)
MCV: 87.9 fL (ref 80.0–100.0)
Monocytes Absolute: 1.1 K/uL — ABNORMAL HIGH (ref 0.1–1.0)
Monocytes Relative: 9 %
Neutro Abs: 5.8 K/uL (ref 1.7–7.7)
Neutrophils Relative %: 51 %
Platelets: 360 K/uL (ref 150–400)
RBC: 5.44 MIL/uL (ref 4.22–5.81)
RDW: 13.8 % (ref 11.5–15.5)
WBC: 11.4 K/uL — ABNORMAL HIGH (ref 4.0–10.5)
nRBC: 0 % (ref 0.0–0.2)

## 2024-03-12 LAB — COMPREHENSIVE METABOLIC PANEL WITH GFR
ALT: 39 U/L (ref 0–44)
AST: 28 U/L (ref 15–41)
Albumin: 5.1 g/dL — ABNORMAL HIGH (ref 3.5–5.0)
Alkaline Phosphatase: 95 U/L (ref 38–126)
Anion gap: 17 — ABNORMAL HIGH (ref 5–15)
BUN: 11 mg/dL (ref 6–20)
CO2: 25 mmol/L (ref 22–32)
Calcium: 9.3 mg/dL (ref 8.9–10.3)
Chloride: 101 mmol/L (ref 98–111)
Creatinine, Ser: 1.05 mg/dL (ref 0.61–1.24)
GFR, Estimated: >60 mL/min (ref >60)
Glucose, Bld: 110 mg/dL — ABNORMAL HIGH (ref 70–99)
Potassium: 3.7 mmol/L (ref 3.5–5.1)
Sodium: 143 mmol/L (ref 135–145)
Total Bilirubin: 0.4 mg/dL (ref 0.0–1.2)
Total Protein: 8.1 g/dL (ref 6.5–8.1)

## 2024-03-12 LAB — RAPID URINE DRUG SCREEN, HOSP PERFORMED
Amphetamines: NOT DETECTED
Barbiturates: NOT DETECTED
Benzodiazepines: NOT DETECTED
Cocaine: NOT DETECTED
Opiates: NOT DETECTED
Tetrahydrocannabinol: NOT DETECTED

## 2024-03-12 LAB — ETHANOL: Alcohol, Ethyl (B): 155 mg/dL — ABNORMAL HIGH (ref <15)

## 2024-03-12 MED ORDER — THIAMINE HCL 100 MG/ML IJ SOLN: 100.0000 mg | Freq: Every day | INTRAMUSCULAR | Status: DC

## 2024-03-12 MED ORDER — SODIUM CHLORIDE 0.9 % IV BOLUS
1000.0000 mL | Freq: Once | INTRAVENOUS | Status: AC
  Administered 2024-03-12: 1000 mL via INTRAVENOUS

## 2024-03-12 MED ORDER — FOLIC ACID 1 MG PO TABS: 1.0000 mg | ORAL_TABLET | Freq: Every day | ORAL | Status: DC

## 2024-03-12 MED ORDER — LORAZEPAM 1 MG PO TABS
2.0000 mg | ORAL_TABLET | Freq: Once | ORAL | Status: AC
  Administered 2024-03-12: 2 mg via ORAL
  Filled 2024-03-12: qty 2

## 2024-03-12 MED ORDER — LORAZEPAM 2 MG/ML IJ SOLN
1.0000 mg | INTRAMUSCULAR | Status: DC | PRN
  Administered 2024-03-12: 2 mg via INTRAVENOUS
  Administered 2024-03-13: 1 mg via INTRAVENOUS
  Filled 2024-03-12 (×2): qty 1

## 2024-03-12 MED ORDER — THIAMINE MONONITRATE 100 MG PO TABS: 100.0000 mg | ORAL_TABLET | Freq: Every day | ORAL | Status: DC

## 2024-03-12 MED ORDER — METOCLOPRAMIDE HCL 5 MG/ML IJ SOLN
10.0000 mg | Freq: Once | INTRAMUSCULAR | Status: AC
  Administered 2024-03-12: 10 mg via INTRAVENOUS
  Filled 2024-03-12: qty 2

## 2024-03-12 MED ORDER — ADULT MULTIVITAMIN W/MINERALS CH: 1.0000 | ORAL_TABLET | Freq: Every day | ORAL | Status: DC

## 2024-03-12 MED ORDER — LORAZEPAM 1 MG PO TABS: 1.0000 mg | ORAL_TABLET | ORAL | Status: DC | PRN

## 2024-03-12 NOTE — ED Triage Notes (Addendum)
 Pt presents wanting ETOH detox.  Pt reports he left a 30 day detox program and immediately starting drinking.  Sts last drink was 5hrs ago.  Pt c/o nausea and headache.  Pain score 5/10.     Pt reports [facility] is willing to take him back after he is medically cleared.

## 2024-03-12 NOTE — ED Provider Notes (Signed)
 [place] EMERGENCY DEPARTMENT AT [facility] Provider Note   CSN: [id number] Arrival date & time: 03/12/24  1714     Patient presents with: ETOH Detox   Juan Clarke is a 43 y.o. male.  {Add pertinent medical, surgical, social history, OB history to YEP:67052} Patient to ED concerned for alcohol withdrawal. He was admitted to [facility], discharged yesterday morning after a 30-day stay. He started drinking immediately and drank steadily for 24 hours. Last drink was 5 hours ago. He has a history of severe withdrawal, requiring ICU admissions in the past. He reports he contacted [facility] about re-admission and was told he would need medical clearance prior to being admitted. He state she feel restless and anxious. No vomiting.   The history is provided by the patient. No language interpreter was used.       Prior to Admission medications   Medication Sig Start Date End Date Taking? Authorizing Provider  acamprosate (CAMPRAL) 333 MG tablet Take 666 mg by mouth 3 (three) times daily. 02/26/24   [provider]  acetaminophen  (TYLENOL ) 325 MG tablet Take 325-650 mg by mouth every 6 (six) hours as needed for mild pain (pain score 1-3) or headache.    [provider]  allopurinol  (ZYLOPRIM ) 300 MG tablet Take 300 mg by mouth daily.    [provider]  apixaban  (ELIQUIS ) 5 MG TABS tablet Take 1 tablet (5 mg total) by mouth 2 (two) times daily. 03/09/24   Darlean Ozell NOVAK, MD  ASPERCREME LIDOCAINE  4 % CREA Apply topically. 02/17/24   [provider]  atorvastatin  (LIPITOR) 20 MG tablet Take 20 mg by mouth daily.    [provider]  DULoxetine  (CYMBALTA ) 60 MG capsule Take 120 mg by mouth daily.    [provider]  folic acid  (FOLVITE ) 1 MG tablet Take 1 tablet (1 mg total) by mouth daily. 02/10/24   Sheikh, Omair Latif, DO  levothyroxine  (SYNTHROID ) 75 MCG tablet Take 75 mcg by mouth daily before breakfast. 12/21/23    [provider]  lisinopril -hydrochlorothiazide  (ZESTORETIC ) 20-25 MG tablet Take 1 tablet by mouth daily.    [provider]  Melatonin 5 MG TBDP SMARTSIG:1 Tablet(s) By Mouth Every Evening 02/26/24   [provider]  methocarbamol  (ROBAXIN ) 500 MG tablet Take 500 mg by mouth. 03/08/24   [provider]  Multiple Vitamin (MULTIVITAMIN WITH MINERALS) TABS tablet Take 1 tablet by mouth daily. 02/10/24   Sherrill Cable Latif, DO  oxyCODONE  (ROXICODONE ) 5 MG immediate release tablet Take 1 tablet (5 mg total) by mouth every 6 (six) hours as needed. 02/04/24 02/03/25  Raenelle Coria, MD  PEPCID  COMPLETE [phone] MG chewable tablet Chew 1 tablet by mouth daily as needed (for heartburn or reflux).    [provider]  propranolol  (INDERAL ) 20 MG tablet Take 20 mg by mouth 3 (three) times daily as needed (Anxiety).    [provider]  rOPINIRole (REQUIP) 0.5 MG tablet Take 0.5 mg by mouth daily. 02/13/24   [provider]  thiamine  (VITAMIN B-1) 100 MG tablet Take 1 tablet (100 mg total) by mouth daily. 02/10/24   Sherrill Cable Latif, DO  traZODone  (DESYREL ) 50 MG tablet Take 50 mg by mouth at bedtime as needed for sleep.    [provider]    Allergies: Lexapro [escitalopram oxalate] and Zoloft [sertraline hcl]    Review of Systems  Updated Vital Signs BP 132/75   Pulse (!) 125   Temp 98.9  F (37.2 C) (Oral)   Resp 18   Ht 5' 10 (1.778 m)   Wt 122.9 kg   SpO2 94%   BMI 38.88 kg/m   Physical Exam Constitutional:      Appearance: He is well-developed.  Cardiovascular:     Rate and Rhythm: Tachycardia present.  Pulmonary:     Effort: Pulmonary effort is normal.  Musculoskeletal:        General: Normal range of motion.     Cervical back: Normal range of motion.  Skin:    General: Skin is warm and dry.  Neurological:     Mental Status: He is alert and oriented to person, place, and time.  Psychiatric:        Attention  and Perception: He does not perceive auditory or visual hallucinations.        Mood and Affect: Mood is anxious.        Speech: Speech is rapid and pressured.        Behavior: Behavior is cooperative.        Thought Content: Thought content does not include homicidal or suicidal ideation.     (all labs ordered are listed, but only abnormal results are displayed) Labs Reviewed - No data to display  EKG: None  Radiology: No results found.  {Document cardiac monitor, telemetry assessment procedure when appropriate:32947} Procedures   Medications Ordered in the ED  sodium chloride  0.9 % bolus 1,000 mL (has no administration in time range)  LORazepam  (ATIVAN ) tablet 2 mg (has no administration in time range)      {Click here for ABCD2, HEART and other calculators REFRESH Note before signing:1}                              Medical Decision Making This patient presents to the ED for concern of alcohol withdrawal, this involves an extensive number of treatment options, and is a complaint that carries with it a high risk of complications and morbidity.   Co morbidities that complicate the patient evaluation  ***   Additional history obtained:  Additional history and/or information obtained from chart review, notable for ***   Lab Tests:  I Ordered, and personally interpreted labs.  The pertinent results include:  ***    Imaging Studies ordered:  I ordered imaging studies including *** I independently visualized and interpreted imaging which showed *** I agree with the radiologist interpretation   Cardiac Monitoring:  The patient was maintained on a cardiac monitor.  I personally viewed and interpreted the cardiac monitored which showed an underlying rhythm of: ***   Medicines ordered and prescription drug management:  I ordered medication including ***  for *** Reevaluation of the patient after these medicines showed that the patient  {resolved/improved/worsened:23923::improved} I have reviewed the patients home medicines and have made adjustments as needed   Test Considered:  ***   Critical Interventions:  ***   Consultations Obtained:  I requested consultation with the ***,  and discussed lab and imaging findings as well as pertinent plan - they recommend: ***   Problem List / ED Course:  ***   Reevaluation:  After the interventions noted above, I reevaluated the patient and found that they have :{resolved/improved/worsened:23923::improved}   Social Determinants of Health:  ***   Disposition:  After consideration of the diagnostic results and the patients response to treatment, I feel that the patient would benefit from ***.   Amount and/or  Complexity of Data Reviewed Labs: ordered.  Risk Prescription drug management.   ***  {Document critical care time when appropriate  Document review of labs and clinical decision tools ie CHADS2VASC2, etc  Document your independent review of radiology images and any outside records  Document your discussion with family members, caretakers and with consultants  Document social determinants of health affecting pt's care  Document your decision making why or why not admission, treatments were needed:32947:::1}   Final diagnoses:  None    ED Discharge Orders     None

## 2024-03-13 MED ORDER — CHLORDIAZEPOXIDE HCL 25 MG PO CAPS: ORAL_CAPSULE | ORAL | 0 refills | Status: DC

## 2024-03-13 NOTE — Discharge Instructions (Addendum)
 You were treated tonight for symptoms of early alcohol withdrawal. Please take the prescribed librium  taper to help with symptom management. Return to the emergency department if you develop any life threatening symptoms.

## 2024-03-13 NOTE — ED Provider Notes (Signed)
  Physical Exam  BP (!) 149/91 (BP Location: Right Arm)   Pulse (!) 115   Temp 98.2 F (36.8 C) (Oral)   Resp 18   Ht 5' 10 (1.778 m)   Wt 122.9 kg   SpO2 97%   BMI 38.88 kg/m   Physical Exam  Procedures  Procedures  ED Course / MDM    Medical Decision Making Amount and/or Complexity of Data Reviewed Labs: ordered.  Risk OTC drugs. Prescription drug management.   Patient care assumed at shift handoff from previous provider.  See her note for full details.  In short, 43 year old male patient presents the emergency department concerned about alcohol withdrawals.  He was admitted to [facility] and discharged yesterday after a 30-day stay.  He states he immediately started drinking and drink steadily for 24 hours of the last drink 5 hours before presentation to the emergency department.  He does have history of severe withdrawal including ICU admissions.  He told the previous provider that [facility] will likely take him back once he has medical clearance.  Plans at time of shift handoff were to monitor patient and make definitive plan including possible Librium  taper prescription.  Patient did have increase in CIWA score due to restlessness and was treated with Ativan .  His CIWA score has steadily improved overnight and is now 3.  The patient feels stable with a discharge home with a Librium  taper.  He appears medically clear for discharge at this time.       Juan Clarke 03/13/24 0544    Trine Raynell Moder, MD 03/13/24 [phone]

## 2024-03-22 ENCOUNTER — Telehealth: Payer: Self-pay | Admitting: *Deleted

## 2024-03-22 NOTE — Telephone Encounter (Signed)
 Copied from CRM #[id number]. Topic: Referral - Status >> Mar 21, 2024  9:11 AM Nathanel DEL wrote: Reason for CRM: Joen w/ the cancer center states they have tried to reach the pt 3 X, and have not been able to reach him to schedule the referral appt sent by Dr Darlean.  Do you want her to cancel the referral, or keep trying to call him?   Cb [id number]

## 2024-03-23 NOTE — Telephone Encounter (Signed)
 Per MW:  Cancel appt but send letter to pt telling him to say on eliquis  and f/u with his PCP before stopping it

## 2024-03-26 NOTE — Telephone Encounter (Signed)
 I called the pt and there was no answer- LMTCB. ?

## 2024-04-12 ENCOUNTER — Encounter: Payer: Self-pay | Admitting: *Deleted

## 2024-04-12 NOTE — Telephone Encounter (Signed)
 Letter mailed to the pt and onc has already closed out the referral.

## 2024-05-01 ENCOUNTER — Encounter (HOSPITAL_COMMUNITY): Payer: Self-pay | Admitting: Emergency Medicine

## 2024-05-01 ENCOUNTER — Other Ambulatory Visit: Payer: Self-pay

## 2024-05-01 ENCOUNTER — Emergency Department (HOSPITAL_COMMUNITY)
Admission: EM | Admit: 2024-05-01 | Discharge: 2024-05-01 | Disposition: A | Discharge status: Home / Self Care | Source: Ambulatory Visit | Attending: Emergency Medicine | Admitting: Emergency Medicine

## 2024-05-01 DIAGNOSIS — E876 Hypokalemia: Secondary | ICD-10-CM | POA: Diagnosis not present

## 2024-05-01 DIAGNOSIS — Z7901 Long term (current) use of anticoagulants: Secondary | ICD-10-CM | POA: Insufficient documentation

## 2024-05-01 DIAGNOSIS — F1010 Alcohol abuse, uncomplicated: Secondary | ICD-10-CM | POA: Insufficient documentation

## 2024-05-01 DIAGNOSIS — Y901 Blood alcohol level of 20-39 mg/100 ml: Secondary | ICD-10-CM | POA: Insufficient documentation

## 2024-05-01 DIAGNOSIS — F1090 Alcohol use, unspecified, uncomplicated: Secondary | ICD-10-CM

## 2024-05-01 LAB — CBC
HCT: 45.3 % (ref 39.0–52.0)
Hemoglobin: 14.9 g/dL (ref 13.0–17.0)
MCH: 28.1 pg (ref 26.0–34.0)
MCHC: 32.9 g/dL (ref 30.0–36.0)
MCV: 85.3 fL (ref 80.0–100.0)
Platelets: 201 K/uL (ref 150–400)
RBC: 5.31 MIL/uL (ref 4.22–5.81)
RDW: 13.9 % (ref 11.5–15.5)
WBC: 8.8 K/uL (ref 4.0–10.5)
nRBC: 0 % (ref 0.0–0.2)

## 2024-05-01 LAB — URINE DRUG SCREEN
Amphetamines: NEGATIVE
Barbiturates: NEGATIVE
Benzodiazepines: NEGATIVE
Cocaine: NEGATIVE
Fentanyl: NEGATIVE
Methadone Scn, Ur: NEGATIVE
Opiates: NEGATIVE
Tetrahydrocannabinol: NEGATIVE

## 2024-05-01 LAB — COMPREHENSIVE METABOLIC PANEL WITH GFR
ALT: 79 U/L — ABNORMAL HIGH (ref 0–44)
AST: 60 U/L — ABNORMAL HIGH (ref 15–41)
Albumin: 4.4 g/dL (ref 3.5–5.0)
Alkaline Phosphatase: 97 U/L (ref 38–126)
Anion gap: 17 — ABNORMAL HIGH (ref 5–15)
BUN: 10 mg/dL (ref 6–20)
CO2: 22 mmol/L (ref 22–32)
Calcium: 9.1 mg/dL (ref 8.9–10.3)
Chloride: 97 mmol/L — ABNORMAL LOW (ref 98–111)
Creatinine, Ser: 0.71 mg/dL (ref 0.61–1.24)
GFR, Estimated: >60 mL/min (ref >60)
Glucose, Bld: 115 mg/dL — ABNORMAL HIGH (ref 70–99)
Potassium: 3.4 mmol/L — ABNORMAL LOW (ref 3.5–5.1)
Sodium: 136 mmol/L (ref 135–145)
Total Bilirubin: 0.3 mg/dL (ref 0.0–1.2)
Total Protein: 6.6 g/dL (ref 6.5–8.1)

## 2024-05-01 LAB — ETHANOL: Alcohol, Ethyl (B): 35 mg/dL — ABNORMAL HIGH (ref <15)

## 2024-05-01 MED ORDER — LACTATED RINGERS IV BOLUS
1000.0000 mL | Freq: Once | INTRAVENOUS | Status: AC
  Administered 2024-05-01: 1000 mL via INTRAVENOUS

## 2024-05-01 MED ORDER — LORAZEPAM 1 MG PO TABS
1.0000 mg | ORAL_TABLET | Freq: Once | ORAL | Status: AC
  Administered 2024-05-01: 1 mg via ORAL
  Filled 2024-05-01: qty 1

## 2024-05-01 MED ORDER — POTASSIUM CHLORIDE CRYS ER 20 MEQ PO TBCR
40.0000 meq | EXTENDED_RELEASE_TABLET | Freq: Once | ORAL | Status: AC
  Administered 2024-05-01: 40 meq via ORAL
  Filled 2024-05-01: qty 2

## 2024-05-01 MED ORDER — CHLORDIAZEPOXIDE HCL 25 MG PO CAPS: ORAL_CAPSULE | ORAL | 0 refills | Status: DC

## 2024-05-01 NOTE — Discharge Instructions (Addendum)
 You were seen in the ER today.  Your electrolytes were mildly low, please make sure you follow-up with your PCP for recheck of these.  I have started you on Librium .  Please take as directed.  Please make sure you are following with your outpatient rehab.  Make sure that you are staying well-hydrated drinking plenty of fluids, and the water .  If you have any other concerns, new or worsening symptoms, please return your nearest emergency department for reevaluation.  Contact a health care provider if: You cannot take your medicines as told. Your symptoms get worse or you experience symptoms of withdrawal when you stop drinking. You start drinking again (relapse) and your symptoms get worse. Get help right away if: You have thoughts about hurting yourself or others. Get help right away if you feel like you may hurt yourself or others, or have thoughts about taking your own life. Go to your nearest emergency room or: Call 911. Call the National Suicide Prevention Lifeline at (972)051-8113 or 988. This is open 24 hours a day. Text the Crisis Text Line at 870-237-5239.

## 2024-05-01 NOTE — ED Provider Notes (Signed)
 [place] EMERGENCY DEPARTMENT AT [facility] Provider Note   CSN: [id number] Arrival date & time: 05/01/24  1137     Patient presents with: Alcohol Problem   Juan Clarke is a 43 y.o. male with h/o alcohol use disorder, PE on Elliquis presents to the ER today for alcohol detox.  Patient reports that he consumes five 24oz malt beverages a day.  Last drink was 1100 today and was a mild beverage of 24 ounces.  He reports that he has previously needed ICU admission with Precedex  drips for significant delirium tremens however he does not know if he has had seizure during that time.  Additionally, he has tolerated Librium  taper since then without any difficulties.  Last time he tried this was in July 2025.  He was then followed show [facility] however resumed drinking after his program completed.  Currently, he denies any hallucinations, tremors, nausea, vomiting, abdominal pain, headache, or diaphoresis. He reports he was told by his PCP that he would need medical detox given his history.    Alcohol Problem Pertinent negatives include no chest pain, no abdominal pain, no headaches and no shortness of breath.       Prior to Admission medications   Medication Sig Start Date End Date Taking? Authorizing Provider  chlordiazePOXIDE  (LIBRIUM ) 25 MG capsule 50mg  PO TID x 1D, then 25-50mg  PO BID X 1D, then 25-50mg  PO QD X 1D 05/01/24  Yes Bernis Ernst, PA-C  acamprosate  (CAMPRAL ) 333 MG tablet Take 666 mg by mouth 3 (three) times daily. 02/26/24   [provider]  acetaminophen  (TYLENOL ) 325 MG tablet Take 325-650 mg by mouth every 6 (six) hours as needed for mild pain (pain score 1-3) or headache.    [provider]  allopurinol  (ZYLOPRIM ) 300 MG tablet Take 300 mg by mouth daily.    [provider]  apixaban  (ELIQUIS ) 5 MG TABS tablet Take 1 tablet (5 mg total) by mouth 2 (two) times daily. 03/09/24   Darlean Ozell NOVAK, MD  ASPERCREME LIDOCAINE  4 % CREA Apply  topically. 02/17/24   [provider]  atorvastatin  (LIPITOR) 20 MG tablet Take 20 mg by mouth daily.    [provider]  DULoxetine  (CYMBALTA ) 60 MG capsule Take 120 mg by mouth daily.    [provider]  folic acid  (FOLVITE ) 1 MG tablet Take 1 tablet (1 mg total) by mouth daily. 02/10/24   Sheikh, Omair Latif, DO  levothyroxine  (SYNTHROID ) 75 MCG tablet Take 75 mcg by mouth daily before breakfast. 12/21/23   [provider]  lisinopril -hydrochlorothiazide  (ZESTORETIC ) 20-25 MG tablet Take 1 tablet by mouth daily.    [provider]  Melatonin 5 MG TBDP SMARTSIG:1 Tablet(s) By Mouth Every Evening 02/26/24   [provider]  methocarbamol  (ROBAXIN ) 500 MG tablet Take 500 mg by mouth. 03/08/24   [provider]  Multiple Vitamin (MULTIVITAMIN WITH MINERALS) TABS tablet Take 1 tablet by mouth daily. 02/10/24   Sherrill Cable Latif, DO  oxyCODONE  (ROXICODONE ) 5 MG immediate release tablet Take 1 tablet (5 mg total) by mouth every 6 (six) hours as needed. 02/04/24 02/03/25  Raenelle Coria, MD  PEPCID  COMPLETE [phone] MG chewable tablet Chew 1 tablet by mouth daily as needed (for heartburn or reflux).    [provider]  propranolol  (INDERAL ) 20 MG tablet Take 20 mg by mouth 3 (three) times daily as needed (Anxiety).    [provider]  rOPINIRole (REQUIP) 0.5 MG tablet Take 0.5 mg by  mouth daily. 02/13/24   [provider]  thiamine  (VITAMIN B-1) 100 MG tablet Take 1 tablet (100 mg total) by mouth daily. 02/10/24   Sherrill Cable Latif, DO  traZODone  (DESYREL ) 50 MG tablet Take 50 mg by mouth at bedtime as needed for sleep.    [provider]    Allergies: Lexapro [escitalopram oxalate] and Zoloft [sertraline hcl]    Review of Systems  Constitutional:  Negative for chills and fever.  Respiratory:  Negative for shortness of breath.   Cardiovascular:  Negative for chest pain.  Gastrointestinal:  Negative for  abdominal pain, nausea and vomiting.  Neurological:  Negative for headaches.  Psychiatric/Behavioral:  Negative for hallucinations. The patient is not nervous/anxious.     Updated Vital Signs BP (!) 153/103 (BP Location: Left Arm)   Pulse 97   Temp 98.7 F (37.1 C) (Oral)   Resp 18   Ht 5' 7 (1.702 m)   Wt 127 kg   SpO2 100%   BMI 43.85 kg/m   Physical Exam Vitals and nursing note reviewed.  Constitutional:      General: He is not in acute distress.    Appearance: He is not ill-appearing or toxic-appearing.  HENT:     Mouth/Throat:     Mouth: Mucous membranes are moist.  Eyes:     General: No scleral icterus. Pulmonary:     Effort: Pulmonary effort is normal. No respiratory distress.  Skin:    General: Skin is warm and dry.  Neurological:     Mental Status: He is alert.     Cranial Nerves: No dysarthria.     Motor: No tremor.     (all labs ordered are listed, but only abnormal results are displayed) Labs Reviewed  COMPREHENSIVE METABOLIC PANEL WITH GFR - Abnormal; Notable for the following components:      Result Value   Potassium 3.4 (*)    Chloride 97 (*)    Glucose, Bld 115 (*)    AST 60 (*)    ALT 79 (*)    Anion gap 17 (*)    All other components within normal limits  ETHANOL - Abnormal; Notable for the following components:   Alcohol, Ethyl (B) 35 (*)    All other components within normal limits  CBC  URINE DRUG SCREEN    EKG: None  Radiology: No results found.  Procedures   Medications Ordered in the ED  LORazepam  (ATIVAN ) tablet 1 mg (1 mg Oral Given 05/01/24 1407)  lactated ringers  bolus 1,000 mL (0 mLs Intravenous Stopped 05/01/24 1619)  potassium chloride  SA (KLOR-CON  M) CR tablet 40 mEq (40 mEq Oral Given 05/01/24 1408)   Medical Decision Making Amount and/or Complexity of Data Reviewed Labs: ordered.  Risk Prescription drug management.   43 y.o. male presents to the ER for evaluation of alcohol use disorder. Differential diagnosis  includes but is not limited to alcohol use, withdrawal, delirium tremens. Vital signs elevated BP, otherwise unremarkable. Physical exam as noted above.   On previous chart evaluation, patient was recently seen in July 2025 and was given Librium  taper.  He reports that he had good success with this however did resume drinking after his inpatient treatment concluded.  He has required ICU hospitalization with Precedex  and intubation months prior but his alcohol use was more significant per patient.  He denies any issues or problems with his previous Librium  taper recently since then.   I independently reviewed and interpreted the patient's labs.  CBC unremarkable.  CMP shows slight hypokalemia at 3.4 [place] at 97.  Glucose of 115.  AST is 60 with an ALT of 79.  Anion gap is 17.  No other electrolyte or LFT abnormality UDS negative.  Ethanol 35.  CIWA score of 3 per nursing. His alcohol is minimal, but reports consuming 24 oz of alcohol around 1.5 hours PTA. Will given small dose of Ativan  preventatively. Given his gap, will give him some IVF.   The patient reports he is still feeling at his baseline of health.  He reports his PCP sent him to the emergency department to receive medical detox of alcohol however patient has not needed detox since his initial ICU admission.  He has tolerated Librium  well without any difficulty.  I had a shared decision making with patient at bedside.  Discussed inpatient treatment versus Librium .  Patient reports that he would like to be treated outpatient if possible.  Given that he had no issues with his last Librium  taper, do feel he gets stable for him to be treated outpatient however discussed with him strict return precautions when to return the emergency department. He is following up with an outpatient in clinic and has already made contact with them.   We discussed the results of the labs/imaging. The plan is librium  taper, follow up with OP resources, alcohol  cessation. We discussed strict return precautions and red flag symptoms. The patient verbalized their understanding and agrees to the plan. The patient is stable and being discharged home in good condition.  I discussed this case with my attending physician who cosigned this note including patient's presenting symptoms, physical exam, and planned diagnostics and interventions. Attending physician stated agreement with plan or made changes to plan which were implemented.   Portions of this report may have been transcribed using voice recognition software. Every effort was made to ensure accuracy; however, inadvertent computerized transcription errors may be present.    Final diagnoses:  Alcohol use disorder  Hypokalemia    ED Discharge Orders          Ordered    chlordiazePOXIDE  (LIBRIUM ) 25 MG capsule        05/01/24 1410               Bernis Ernst, PA-C 05/02/24 1742    Freddi Hamilton, MD 05/06/24 2234

## 2024-05-01 NOTE — ED Notes (Signed)
 RN is aware of pt's BP readings. Pt states that he is not in pain.

## 2024-05-01 NOTE — ED Triage Notes (Signed)
 43 y/o male comes in requesting inpatient detox from alcohol. Pt reports he had his last drink about an hour ago and drinks about 5-6 24oz high content beers. Pt in no acute distress at this time, calm, cooperative and answering questions appropriately

## 2024-05-07 ENCOUNTER — Emergency Department (HOSPITAL_COMMUNITY)

## 2024-05-07 ENCOUNTER — Other Ambulatory Visit: Payer: Self-pay

## 2024-05-07 ENCOUNTER — Inpatient Hospital Stay (HOSPITAL_COMMUNITY)
Admission: EM | Admit: 2024-05-07 | Discharge: 2024-05-09 | DRG: 897 | Disposition: A | Discharge status: Home / Self Care | Attending: Internal Medicine | Admitting: Internal Medicine

## 2024-05-07 ENCOUNTER — Encounter (HOSPITAL_COMMUNITY): Payer: Self-pay | Admitting: Emergency Medicine

## 2024-05-07 DIAGNOSIS — Z79899 Other long term (current) drug therapy: Secondary | ICD-10-CM

## 2024-05-07 DIAGNOSIS — Z888 Allergy status to other drugs, medicaments and biological substances status: Secondary | ICD-10-CM

## 2024-05-07 DIAGNOSIS — Z6841 Body Mass Index (BMI) 40.0 and over, adult: Secondary | ICD-10-CM | POA: Diagnosis not present

## 2024-05-07 DIAGNOSIS — Z79891 Long term (current) use of opiate analgesic: Secondary | ICD-10-CM

## 2024-05-07 DIAGNOSIS — Z86718 Personal history of other venous thrombosis and embolism: Secondary | ICD-10-CM

## 2024-05-07 DIAGNOSIS — Z79890 Hormone replacement therapy (postmenopausal): Secondary | ICD-10-CM | POA: Diagnosis not present

## 2024-05-07 DIAGNOSIS — T45516A Underdosing of anticoagulants, initial encounter: Secondary | ICD-10-CM | POA: Diagnosis present

## 2024-05-07 DIAGNOSIS — F1010 Alcohol abuse, uncomplicated: Secondary | ICD-10-CM | POA: Diagnosis present

## 2024-05-07 DIAGNOSIS — F411 Generalized anxiety disorder: Secondary | ICD-10-CM | POA: Diagnosis present

## 2024-05-07 DIAGNOSIS — R739 Hyperglycemia, unspecified: Secondary | ICD-10-CM | POA: Diagnosis present

## 2024-05-07 DIAGNOSIS — E039 Hypothyroidism, unspecified: Secondary | ICD-10-CM | POA: Diagnosis present

## 2024-05-07 DIAGNOSIS — E66813 Obesity, class 3: Secondary | ICD-10-CM | POA: Diagnosis present

## 2024-05-07 DIAGNOSIS — Y902 Blood alcohol level of 40-59 mg/100 ml: Secondary | ICD-10-CM | POA: Diagnosis present

## 2024-05-07 DIAGNOSIS — F10939 Alcohol use, unspecified with withdrawal, unspecified: Principal | ICD-10-CM | POA: Diagnosis present

## 2024-05-07 DIAGNOSIS — F322 Major depressive disorder, single episode, severe without psychotic features: Secondary | ICD-10-CM | POA: Diagnosis present

## 2024-05-07 DIAGNOSIS — Z8249 Family history of ischemic heart disease and other diseases of the circulatory system: Secondary | ICD-10-CM | POA: Diagnosis not present

## 2024-05-07 DIAGNOSIS — Z7141 Alcohol abuse counseling and surveillance of alcoholic: Secondary | ICD-10-CM

## 2024-05-07 DIAGNOSIS — Z813 Family history of other psychoactive substance abuse and dependence: Secondary | ICD-10-CM

## 2024-05-07 DIAGNOSIS — I10 Essential (primary) hypertension: Secondary | ICD-10-CM | POA: Diagnosis present

## 2024-05-07 DIAGNOSIS — F10930 Alcohol use, unspecified with withdrawal, uncomplicated: Secondary | ICD-10-CM | POA: Diagnosis not present

## 2024-05-07 DIAGNOSIS — E782 Mixed hyperlipidemia: Secondary | ICD-10-CM | POA: Diagnosis present

## 2024-05-07 DIAGNOSIS — Z86711 Personal history of pulmonary embolism: Secondary | ICD-10-CM | POA: Diagnosis not present

## 2024-05-07 DIAGNOSIS — F10139 Alcohol abuse with withdrawal, unspecified: Principal | ICD-10-CM | POA: Diagnosis present

## 2024-05-07 DIAGNOSIS — Z7901 Long term (current) use of anticoagulants: Secondary | ICD-10-CM | POA: Diagnosis not present

## 2024-05-07 DIAGNOSIS — Z818 Family history of other mental and behavioral disorders: Secondary | ICD-10-CM

## 2024-05-07 DIAGNOSIS — I2699 Other pulmonary embolism without acute cor pulmonale: Secondary | ICD-10-CM | POA: Diagnosis present

## 2024-05-07 DIAGNOSIS — R7401 Elevation of levels of liver transaminase levels: Secondary | ICD-10-CM | POA: Diagnosis present

## 2024-05-07 LAB — COMPREHENSIVE METABOLIC PANEL WITH GFR
ALT: 257 U/L — ABNORMAL HIGH (ref 0–44)
AST: 292 U/L — ABNORMAL HIGH (ref 15–41)
Albumin: 4.3 g/dL (ref 3.5–5.0)
Alkaline Phosphatase: 92 U/L (ref 38–126)
Anion gap: 13 (ref 5–15)
BUN: 10 mg/dL (ref 6–20)
CO2: 22 mmol/L (ref 22–32)
Calcium: 9.1 mg/dL (ref 8.9–10.3)
Chloride: 103 mmol/L (ref 98–111)
Creatinine, Ser: 0.82 mg/dL (ref 0.61–1.24)
GFR, Estimated: >60 mL/min (ref >60)
Glucose, Bld: 144 mg/dL — ABNORMAL HIGH (ref 70–99)
Potassium: 4.5 mmol/L (ref 3.5–5.1)
Sodium: 138 mmol/L (ref 135–145)
Total Bilirubin: 0.3 mg/dL (ref 0.0–1.2)
Total Protein: 6.5 g/dL (ref 6.5–8.1)

## 2024-05-07 LAB — CBC WITH DIFFERENTIAL/PLATELET
Abs Immature Granulocytes: 0.02 K/uL (ref 0.00–0.07)
Basophils Absolute: 0.1 K/uL (ref 0.0–0.1)
Basophils Relative: 1 %
Eosinophils Absolute: 0.1 K/uL (ref 0.0–0.5)
Eosinophils Relative: 3 %
HCT: 46.7 % (ref 39.0–52.0)
Hemoglobin: 14.8 g/dL (ref 13.0–17.0)
Immature Granulocytes: 1 %
Lymphocytes Relative: 31 %
Lymphs Abs: 1.3 K/uL (ref 0.7–4.0)
MCH: 27.7 pg (ref 26.0–34.0)
MCHC: 31.7 g/dL (ref 30.0–36.0)
MCV: 87.5 fL (ref 80.0–100.0)
Monocytes Absolute: 0.5 K/uL (ref 0.1–1.0)
Monocytes Relative: 10 %
Neutro Abs: 2.4 K/uL (ref 1.7–7.7)
Neutrophils Relative %: 54 %
Platelets: 192 K/uL (ref 150–400)
RBC: 5.34 MIL/uL (ref 4.22–5.81)
RDW: 14.2 % (ref 11.5–15.5)
WBC: 4.4 K/uL (ref 4.0–10.5)
nRBC: 0 % (ref 0.0–0.2)

## 2024-05-07 LAB — MAGNESIUM: Magnesium: 2.5 mg/dL — ABNORMAL HIGH (ref 1.7–2.4)

## 2024-05-07 LAB — C DIFFICILE QUICK SCREEN W PCR REFLEX
C Diff antigen: NEGATIVE
C Diff interpretation: NOT DETECTED
C Diff toxin: NEGATIVE

## 2024-05-07 LAB — ETHANOL: Alcohol, Ethyl (B): 40 mg/dL — ABNORMAL HIGH (ref <15)

## 2024-05-07 LAB — MRSA NEXT GEN BY PCR, NASAL: MRSA by PCR Next Gen: NOT DETECTED

## 2024-05-07 LAB — TROPONIN T, HIGH SENSITIVITY: Troponin T High Sensitivity: <15 ng/L (ref 0–19)

## 2024-05-07 LAB — PHOSPHORUS: Phosphorus: 1.9 mg/dL — ABNORMAL LOW (ref 2.5–4.6)

## 2024-05-07 MED ORDER — INFLUENZA VIRUS VACC SPLIT PF (FLUZONE) 0.5 ML IM SUSY: 0.5000 mL | PREFILLED_SYRINGE | INTRAMUSCULAR | Status: DC

## 2024-05-07 MED ORDER — TRAZODONE HCL 50 MG PO TABS
100.0000 mg | ORAL_TABLET | Freq: Every evening | ORAL | Status: DC | PRN
  Administered 2024-05-08: 100 mg via ORAL
  Filled 2024-05-07: qty 2

## 2024-05-07 MED ORDER — ACAMPROSATE CALCIUM 333 MG PO TBEC
666.0000 mg | DELAYED_RELEASE_TABLET | Freq: Three times a day (TID) | ORAL | Status: DC
  Administered 2024-05-07 – 2024-05-09 (×6): 666 mg via ORAL
  Filled 2024-05-07 (×7): qty 2

## 2024-05-07 MED ORDER — LACTATED RINGERS IV BOLUS
1000.0000 mL | Freq: Once | INTRAVENOUS | Status: AC
  Administered 2024-05-07: 1000 mL via INTRAVENOUS

## 2024-05-07 MED ORDER — ADULT MULTIVITAMIN W/MINERALS CH
1.0000 | ORAL_TABLET | Freq: Every day | ORAL | Status: DC
  Administered 2024-05-07 – 2024-05-09 (×3): 1 via ORAL
  Filled 2024-05-07 (×3): qty 1

## 2024-05-07 MED ORDER — LORAZEPAM 1 MG PO TABS: 0.0000 mg | ORAL_TABLET | Freq: Three times a day (TID) | ORAL | Status: DC

## 2024-05-07 MED ORDER — LORAZEPAM 2 MG/ML IJ SOLN
2.0000 mg | Freq: Once | INTRAMUSCULAR | Status: AC
  Administered 2024-05-07: 2 mg via INTRAVENOUS
  Filled 2024-05-07: qty 1

## 2024-05-07 MED ORDER — LORAZEPAM 2 MG/ML IJ SOLN
1.0000 mg | INTRAMUSCULAR | Status: DC | PRN
  Administered 2024-05-07 (×3): 2 mg via INTRAVENOUS
  Filled 2024-05-07 (×3): qty 1

## 2024-05-07 MED ORDER — LISINOPRIL 10 MG PO TABS
20.0000 mg | ORAL_TABLET | Freq: Every day | ORAL | Status: DC
  Administered 2024-05-07 – 2024-05-09 (×3): 20 mg via ORAL
  Filled 2024-05-07 (×3): qty 2

## 2024-05-07 MED ORDER — LORAZEPAM 1 MG PO TABS
1.0000 mg | ORAL_TABLET | ORAL | Status: DC | PRN
  Administered 2024-05-07 (×2): 1 mg via ORAL
  Administered 2024-05-07: 3 mg via ORAL
  Administered 2024-05-07: 4 mg via ORAL
  Administered 2024-05-08 (×2): 2 mg via ORAL
  Administered 2024-05-08: 3 mg via ORAL
  Administered 2024-05-08: 1 mg via ORAL
  Administered 2024-05-08: 3 mg via ORAL
  Administered 2024-05-08 (×2): 1 mg via ORAL
  Administered 2024-05-08 (×3): 2 mg via ORAL
  Administered 2024-05-08: 3 mg via ORAL
  Administered 2024-05-09: 1 mg via ORAL
  Administered 2024-05-09: 2 mg via ORAL
  Filled 2024-05-07: qty 2
  Filled 2024-05-07: qty 3
  Filled 2024-05-07: qty 1
  Filled 2024-05-07: qty 3
  Filled 2024-05-07: qty 4
  Filled 2024-05-07 (×2): qty 2
  Filled 2024-05-07 (×2): qty 3
  Filled 2024-05-07: qty 1
  Filled 2024-05-07 (×2): qty 2
  Filled 2024-05-07: qty 3
  Filled 2024-05-07: qty 1
  Filled 2024-05-07: qty 2
  Filled 2024-05-07 (×3): qty 1

## 2024-05-07 MED ORDER — PHENOBARBITAL 32.4 MG PO TABS
32.4000 mg | ORAL_TABLET | Freq: Two times a day (BID) | ORAL | Status: DC
  Administered 2024-05-09: 32.4 mg via ORAL
  Filled 2024-05-07: qty 1

## 2024-05-07 MED ORDER — THIAMINE HCL 100 MG/ML IJ SOLN
100.0000 mg | Freq: Every day | INTRAMUSCULAR | Status: DC
  Administered 2024-05-07: 100 mg via INTRAVENOUS
  Filled 2024-05-07: qty 2

## 2024-05-07 MED ORDER — LABETALOL HCL 5 MG/ML IV SOLN
20.0000 mg | INTRAVENOUS | Status: DC | PRN
  Administered 2024-05-07 – 2024-05-09 (×6): 20 mg via INTRAVENOUS
  Filled 2024-05-07 (×6): qty 4

## 2024-05-07 MED ORDER — LORAZEPAM 2 MG/ML IJ SOLN
0.0000 mg | Freq: Four times a day (QID) | INTRAMUSCULAR | Status: DC
  Administered 2024-05-07: 3 mg via INTRAVENOUS
  Filled 2024-05-07: qty 2

## 2024-05-07 MED ORDER — ACETAMINOPHEN 325 MG PO TABS: 650.0000 mg | ORAL_TABLET | Freq: Four times a day (QID) | ORAL | Status: DC | PRN

## 2024-05-07 MED ORDER — PHENOBARBITAL 32.4 MG PO TABS
64.8000 mg | ORAL_TABLET | Freq: Two times a day (BID) | ORAL | Status: AC
  Administered 2024-05-08 (×2): 64.8 mg via ORAL
  Filled 2024-05-07 (×2): qty 2

## 2024-05-07 MED ORDER — DIAZEPAM 5 MG/ML IJ SOLN
10.0000 mg | Freq: Once | INTRAMUSCULAR | Status: AC
  Administered 2024-05-07: 10 mg via INTRAVENOUS
  Filled 2024-05-07: qty 2

## 2024-05-07 MED ORDER — PHENOBARBITAL 32.4 MG PO TABS
97.2000 mg | ORAL_TABLET | Freq: Two times a day (BID) | ORAL | Status: AC
  Administered 2024-05-07 (×2): 97.2 mg via ORAL
  Filled 2024-05-07 (×2): qty 3

## 2024-05-07 MED ORDER — LABETALOL HCL 5 MG/ML IV SOLN
20.0000 mg | Freq: Once | INTRAVENOUS | Status: AC
Start: 2024-05-07 — End: 2024-05-07
  Administered 2024-05-07: 20 mg via INTRAVENOUS
  Filled 2024-05-07: qty 4

## 2024-05-07 MED ORDER — ACETAMINOPHEN 650 MG RE SUPP: 650.0000 mg | Freq: Four times a day (QID) | RECTAL | Status: DC | PRN

## 2024-05-07 MED ORDER — CHLORHEXIDINE GLUCONATE CLOTH 2 % EX PADS
6.0000 | MEDICATED_PAD | Freq: Every day | CUTANEOUS | Status: DC
  Administered 2024-05-08: 6 via TOPICAL

## 2024-05-07 MED ORDER — THIAMINE MONONITRATE 100 MG PO TABS
100.0000 mg | ORAL_TABLET | Freq: Every day | ORAL | Status: DC
  Administered 2024-05-08 – 2024-05-09 (×2): 100 mg via ORAL
  Filled 2024-05-07 (×3): qty 1

## 2024-05-07 MED ORDER — CLONIDINE HCL 0.1 MG PO TABS
0.2000 mg | ORAL_TABLET | Freq: Once | ORAL | Status: AC
  Administered 2024-05-07: 0.2 mg via ORAL
  Filled 2024-05-07: qty 2

## 2024-05-07 MED ORDER — K PHOS MONO-SOD PHOS DI & MONO 155-852-130 MG PO TABS
500.0000 mg | ORAL_TABLET | Freq: Four times a day (QID) | ORAL | Status: AC
  Administered 2024-05-07 – 2024-05-08 (×4): 500 mg via ORAL
  Filled 2024-05-07 (×4): qty 2

## 2024-05-07 MED ORDER — PHENOBARBITAL 32.4 MG PO TABS: 16.2000 mg | ORAL_TABLET | Freq: Two times a day (BID) | ORAL | Status: DC

## 2024-05-07 MED ORDER — LEVOTHYROXINE SODIUM 75 MCG PO TABS
75.0000 ug | ORAL_TABLET | Freq: Every day | ORAL | Status: DC
  Administered 2024-05-08 – 2024-05-09 (×2): 75 ug via ORAL
  Filled 2024-05-07 (×2): qty 1

## 2024-05-07 MED ORDER — LORAZEPAM 1 MG PO TABS: 0.0000 mg | ORAL_TABLET | Freq: Two times a day (BID) | ORAL | Status: DC

## 2024-05-07 MED ORDER — ENOXAPARIN SODIUM 150 MG/ML IJ SOSY
130.0000 mg | PREFILLED_SYRINGE | Freq: Two times a day (BID) | INTRAMUSCULAR | Status: DC
  Administered 2024-05-07 – 2024-05-09 (×5): 130 mg via SUBCUTANEOUS
  Filled 2024-05-07 (×5): qty 0.86

## 2024-05-07 MED ORDER — LORAZEPAM 1 MG PO TABS
0.0000 mg | ORAL_TABLET | ORAL | Status: AC
  Administered 2024-05-07: 4 mg via ORAL
  Administered 2024-05-07 (×2): 2 mg via ORAL
  Administered 2024-05-08: 3 mg via ORAL
  Administered 2024-05-08: 1 mg via ORAL
  Administered 2024-05-08 (×3): 3 mg via ORAL
  Administered 2024-05-08: 1 mg via ORAL
  Administered 2024-05-09 (×2): 2 mg via ORAL
  Filled 2024-05-07: qty 1
  Filled 2024-05-07: qty 3
  Filled 2024-05-07: qty 1
  Filled 2024-05-07: qty 3
  Filled 2024-05-07 (×3): qty 2
  Filled 2024-05-07: qty 3
  Filled 2024-05-07: qty 2
  Filled 2024-05-07: qty 4

## 2024-05-07 MED ORDER — CHLORHEXIDINE GLUCONATE CLOTH 2 % EX PADS
6.0000 | MEDICATED_PAD | Freq: Every day | CUTANEOUS | Status: DC
Start: 2024-05-07 — End: 2024-05-07
  Administered 2024-05-07: 6 via TOPICAL

## 2024-05-07 MED ORDER — ACETAMINOPHEN 500 MG PO TABS
500.0000 mg | ORAL_TABLET | Freq: Four times a day (QID) | ORAL | Status: DC | PRN
  Administered 2024-05-08 (×2): 500 mg via ORAL
  Filled 2024-05-07 (×2): qty 1

## 2024-05-07 MED ORDER — LORAZEPAM 1 MG PO TABS: 0.0000 mg | ORAL_TABLET | Freq: Four times a day (QID) | ORAL | Status: DC

## 2024-05-07 MED ORDER — FOLIC ACID 1 MG PO TABS
1.0000 mg | ORAL_TABLET | Freq: Every day | ORAL | Status: DC
  Administered 2024-05-07 – 2024-05-09 (×3): 1 mg via ORAL
  Filled 2024-05-07 (×3): qty 1

## 2024-05-07 MED ORDER — ALLOPURINOL 100 MG PO TABS
300.0000 mg | ORAL_TABLET | Freq: Every day | ORAL | Status: DC
  Administered 2024-05-07 – 2024-05-09 (×3): 300 mg via ORAL
  Filled 2024-05-07 (×3): qty 3

## 2024-05-07 MED ORDER — ACETAMINOPHEN 650 MG RE SUPP: 325.0000 mg | RECTAL | Status: DC | PRN

## 2024-05-07 MED ORDER — LORAZEPAM 2 MG/ML IJ SOLN: 0.0000 mg | Freq: Two times a day (BID) | INTRAMUSCULAR | Status: DC

## 2024-05-07 MED ORDER — LISINOPRIL-HYDROCHLOROTHIAZIDE 20-25 MG PO TABS
1.0000 | ORAL_TABLET | Freq: Every day | ORAL | Status: DC
Start: 2024-05-07 — End: 2024-05-07

## 2024-05-07 MED ORDER — APIXABAN 5 MG PO TABS
5.0000 mg | ORAL_TABLET | Freq: Two times a day (BID) | ORAL | Status: DC
Start: 2024-05-07 — End: 2024-05-07

## 2024-05-07 MED ORDER — HYDROCHLOROTHIAZIDE 25 MG PO TABS
25.0000 mg | ORAL_TABLET | Freq: Every day | ORAL | Status: DC
  Administered 2024-05-07 – 2024-05-09 (×3): 25 mg via ORAL
  Filled 2024-05-07 (×3): qty 1

## 2024-05-07 MED ORDER — PROCHLORPERAZINE EDISYLATE 10 MG/2ML IJ SOLN
10.0000 mg | Freq: Four times a day (QID) | INTRAMUSCULAR | Status: DC | PRN
  Administered 2024-05-07 (×2): 10 mg via INTRAVENOUS
  Filled 2024-05-07 (×2): qty 2

## 2024-05-07 NOTE — Plan of Care (Signed)

## 2024-05-07 NOTE — ED Provider Notes (Signed)
 [place] EMERGENCY DEPARTMENT AT [facility] Provider Note   CSN: [id number] Arrival date & time: 05/07/24  9391     Patient presents with: Alcohol Problem  HPI Juan Clarke is a 43 y.o. male with history of alcohol abuse, PE/DVT on Eliquis , recent admission for DTs and serotonin syndrome presenting for alcohol problem.  Patient wanting admission for detox.  He states his last drink was 930 last night.  He states he normally drinks about 624 ounce beers per day.  He was recently evaluated here and was sent home on Librium  which he states he just did not get through it and started to drink again.  Also reports that he has missed his last couple doses of Eliquis  does have chest pain that started this morning in the center of his chest that feels like heartburn.  Denies shortness of breath at this time.    Alcohol Problem       Prior to Admission medications   Medication Sig Start Date End Date Taking? Authorizing Provider  acamprosate  (CAMPRAL ) 333 MG tablet Take 666 mg by mouth 3 (three) times daily. 02/26/24  Yes [provider]  acetaminophen  (TYLENOL ) 325 MG tablet Take 650 mg by mouth daily as needed for mild pain (pain score 1-3), headache or moderate pain (pain score 4-6).   Yes [provider]  allopurinol  (ZYLOPRIM ) 300 MG tablet Take 300 mg by mouth daily.   Yes [provider]  apixaban  (ELIQUIS ) 5 MG TABS tablet Take 1 tablet (5 mg total) by mouth 2 (two) times daily. 03/09/24  Yes Darlean Ozell NOVAK, MD  atorvastatin  (LIPITOR) 20 MG tablet Take 20 mg by mouth daily.   Yes [provider]  DULoxetine  (CYMBALTA ) 60 MG capsule Take 120 mg by mouth daily.   Yes [provider]  levothyroxine  (SYNTHROID ) 75 MCG tablet Take 75 mcg by mouth daily before breakfast. 12/21/23  Yes [provider]  lisinopril  (ZESTRIL ) 10 MG tablet Take 10 mg by mouth daily. 02/09/24  Yes [provider]   lisinopril -hydrochlorothiazide  (ZESTORETIC ) 20-25 MG tablet Take 1 tablet by mouth daily.   Yes [provider]  Melatonin 5 MG TBDP Take 5 mg by mouth at bedtime as needed (sleep). 02/26/24  Yes [provider]  methocarbamol  (ROBAXIN ) 500 MG tablet Take 500 mg by mouth daily as needed for muscle spasms. 03/08/24  Yes [provider]  propranolol  (INDERAL ) 20 MG tablet Take 20 mg by mouth 3 (three) times daily as needed (Anxiety).   Yes [provider]  traZODone  (DESYREL ) 100 MG tablet Take 100 mg by mouth at bedtime as needed for sleep.   Yes [provider]  chlordiazePOXIDE  (LIBRIUM ) 25 MG capsule 50mg  PO TID x 1D, then 25-50mg  PO BID X 1D, then 25-50mg  PO QD X 1D Patient not taking: Reported on 05/07/2024 05/01/24   Bernis Ernst, PA-C  folic acid  (FOLVITE ) 1 MG tablet Take 1 tablet (1 mg total) by mouth daily. Patient not taking: Reported on 05/07/2024 02/10/24   Sheikh, Omair Latif, DO  naltrexone (DEPADE) 50 MG tablet Take 50 mg by mouth every morning. Patient not taking: Reported on 05/07/2024 03/21/24   [provider]  oxyCODONE  (ROXICODONE ) 5 MG immediate release tablet Take 1 tablet (5 mg total) by mouth every 6 (six) hours as needed. Patient not taking: Reported on 05/07/2024 02/04/24 02/03/25  Ghimire, Kuber, MD  rOPINIRole (REQUIP) 0.5 MG tablet Take 0.5 mg by mouth daily. Patient not taking: Reported  on 05/07/2024 02/13/24   [provider]  thiamine  (VITAMIN B-1) 100 MG tablet Take 1 tablet (100 mg total) by mouth daily. Patient not taking: Reported on 05/07/2024 02/10/24   Sherrill Alejandro Donovan, DO    Allergies: Lexapro [escitalopram oxalate] and Zoloft [sertraline hcl]    Review of Systems See HPI   Physical Exam   Vitals:   05/07/24 0956 05/07/24 1019  BP:    Pulse: (!) 118 (!) 110  Resp:    Temp:    SpO2:  97%    CONSTITUTIONAL:  we;;-appearing, NAD NEURO:  Alert and oriented x 3, CN 3-12 grossly intact EYES:  eyes  equal and reactive ENT/NECK:  Supple, no stridor, tongue fasciculations noted CARDIO:  Tachycardic, regular rhythm, appears well-perfused  PULM:  No respiratory distress, CTAB GI/GU:  non-distended MSK/SPINE:  No gross deformities, no edema, moves all extremities  SKIN:  no rash, atraumatic  *Additional and/or pertinent findings included in MDM below    (all labs ordered are listed, but only abnormal results are displayed) Labs Reviewed  ETHANOL - Abnormal; Notable for the following components:      Result Value   Alcohol, Ethyl (B) 40 (*)    All other components within normal limits  COMPREHENSIVE METABOLIC PANEL WITH GFR - Abnormal; Notable for the following components:   Glucose, Bld 144 (*)    AST 292 (*)    ALT 257 (*)    All other components within normal limits  CBC WITH DIFFERENTIAL/PLATELET  MAGNESIUM   PHOSPHORUS  TROPONIN T, HIGH SENSITIVITY    EKG: None  Radiology: DG Chest Port 1 View Result Date: 05/07/2024 EXAM: 1 VIEW XRAY OF THE CHEST 05/07/2024 07:17:00 AM COMPARISON: 02/08/2024 CLINICAL HISTORY: CP. Per chart Pt reports that he wants detox from alcohol. FINDINGS: LUNGS AND PLEURA: Interval resolution of right lower lung airspace opacity. No pulmonary edema. No pleural effusion. No pneumothorax. HEART AND MEDIASTINUM: No acute abnormality of the cardiac and mediastinal silhouettes. BONES AND SOFT TISSUES: No acute osseous abnormality. IMPRESSION: 1. Interval resolution of right lower lung airspace opacity. 2. No acute findings. Electronically signed by: Waddell Calk MD 05/07/2024 07:24 AM EDT RP Workstation: [id number]     .Critical Care  Performed by: Lang Norleen POUR, PA-C Authorized by: Lang Norleen POUR, PA-C   Critical care provider statement:    Critical care time (minutes):  30   Critical care was necessary to treat or prevent imminent or life-threatening deterioration of the following conditions: alcohol withdrawal requiring IV ativan  x2.    Critical care was time spent personally by me on the following activities:  Development of treatment plan with patient or surrogate, discussions with consultants, evaluation of patient's response to treatment, examination of patient, ordering and review of laboratory studies, ordering and review of radiographic studies, ordering and performing treatments and interventions, pulse oximetry, re-evaluation of patient's condition and review of old charts    Medications Ordered in the ED  LORazepam  (ATIVAN ) injection 0-4 mg (3 mg Intravenous Given 05/07/24 0729)    Or  LORazepam  (ATIVAN ) tablet 0-4 mg ( Oral See Alternative 05/07/24 0729)  LORazepam  (ATIVAN ) injection 0-4 mg (has no administration in time range)    Or  LORazepam  (ATIVAN ) tablet 0-4 mg (has no administration in time range)  thiamine  (VITAMIN B1) tablet 100 mg ( Oral See Alternative 05/07/24 1009)    Or  thiamine  (VITAMIN B1) injection 100 mg (100 mg Intravenous Given 05/07/24 1009)  PHENobarbital  (LUMINAL) tablet 97.2 mg (has no administration  in time range)  PHENobarbital  (LUMINAL) tablet 64.8 mg (has no administration in time range)  PHENobarbital  (LUMINAL) tablet 32.4 mg (has no administration in time range)  PHENobarbital  (LUMINAL) tablet 16.2 mg (has no administration in time range)  labetalol  (NORMODYNE ) injection 20 mg (has no administration in time range)  lactated ringers  bolus 1,000 mL (1,000 mLs Intravenous New Bag/Given 05/07/24 1008)  LORazepam  (ATIVAN ) injection 2 mg (2 mg Intravenous Given 05/07/24 1009)    Clinical Course as of 05/07/24 1025  Mon May 07, 2024  0948 Basophil: 1 [JR]  0956 On reassessment he was tachycardic and diaphoretic and still tremulous.  Gave another 2 mg of Ativan . [JR]    Clinical Course User Index [JR] Lang Norleen POUR, PA-C                                 Medical Decision Making Amount and/or Complexity of Data Reviewed Labs: ordered. Decision-making details documented in ED  Course. Radiology: ordered.  Risk OTC drugs. Prescription drug management.   Initial Impression and Ddx 43 yo well appearing male presenting for alcohol detox.  Exam notable for tachycardia tongue fasciculations.  DDx includes alcohol withdrawal, delirium tremens, seizure, metabolic encephalopathy, acute liver failure, seizures, PE, ACS, other.  Initial CIWA was 17. Patient PMH that increases complexity of ED encounter:  history of alcohol abuse, PE/DVT on Eliquis , recent admission for DTs and serotonin syndrome  Interpretation of Diagnostics - I independent reviewed and interpreted the labs as followed: AST and ALT elevated, ethanol was 40, negative troponin  - I independently visualized the following imaging with scope of interpretation limited to determining acute life threatening conditions related to emergency care: Chest x-ray, which revealed nonacute and improved from last  -I personally reviewed and interpreted EKG which revealed normal sinus rhythm.  Patient Reassessment and Ultimate Disposition/Management On reassessment, tachycardia was worse also diaphoresis.  Overall clinically worse appearing.  I have high suspicion for alcohol withdrawal and patient is high risk given that he had a recent admission for DTs.  Doubt acute PE given negative troponin, reassuring EKG and chest x-ray.  Advised him to resume his Eliquis  as prescribed.  Gave 2 mg of IV Ativan  after reassessment.  Admitted to hospital service with Dr. Alm Castor.  Patient management required discussion with the following services or consulting groups:  Hospitalist Service  Complexity of Problems Addressed Acute complicated illness or Injury  Additional Data Reviewed and Analyzed Further history obtained from: Past medical history and medications listed in the EMR and Prior ED visit notes  Patient Encounter Risk Assessment Consideration of hospitalization      Final diagnoses:  Alcohol withdrawal syndrome  with complication [facility])    ED Discharge Orders     None          Lang Norleen POUR, PA-C 05/07/24 1026    Dasie Faden, MD 05/09/24 1126

## 2024-05-07 NOTE — ED Triage Notes (Signed)
 Pt reports that he wants detox from alcohol. Reports last drink last 9:30pm. Reports having malt liquor. Hx admission for withdrawal but denies seizures.

## 2024-05-07 NOTE — H&P (Addendum)
 History and Physical    Patient: Juan Clarke FMW:[id number] DOB: 05/18/1981 DOA: 05/07/2024 DOS: the patient was seen and examined on 05/07/2024 PCP: Cleotilde Planas, MD  Patient coming from: Home  Chief Complaint:  Chief Complaint  Patient presents with   Alcohol Problem   HPI: Juan Clarke is a 43 y.o. male with medical history significant of GAD, depression, class I obesity, alcohol abuse, who was discharged yesterday after 13-day admission due to acute metabolic encephalopathy in the setting of EtOH withdrawal and serotonin syndrome who returned to the emergency department requesting detox from alcohol.  128 to 144 ounces of beer daily. He denied hallucinations, fever, chills, rhinorrhea, sore throat, wheezing or hemoptysis.  No chest pain, palpitations, diaphoresis, PND, orthopnea or pitting edema of the lower extremities.  No abdominal pain, nausea, emesis, diarrhea, constipation, melena or hematochezia.  No flank pain, dysuria, frequency or hematuria.  No polyuria, polydipsia, polyphagia or blurred vision.   ED course: Initial vital signs were temperature 97.6 F, pulse 103, respiration 18, BP 191/114 mmHg O2 sat 100% on room air.  The patient received LR 1000 mL liter bolus, lorazepam  2 mg IVP and I added labetalol  20 mg IVP.  Lab work: CBC and first troponin level were normal.  Alcohol was 40, magnesium  2.5 and phosphorus 1.9 mg/dL.  CMP showed a glucose of 144 mg/dL, AST 707 and ALT 742 units/L, rest of the CMP measurements were normal.  Imaging: Portable 1 view chest radiograph showed interval resolution of right lower lobe airspace opacity.  No acute findings.   Review of Systems: As mentioned in the history of present illness. All other systems reviewed and are negative. Past Medical History:  Diagnosis Date   GAD (generalized anxiety disorder)    HTN (hypertension)    Insomnia    Migraine without aura    Overweight    Trigeminal neuralgia    Past Surgical History:   Procedure Laterality Date   Face surgery  11/14/14   TONSILLECTOMY     43 yr old   Social History:  reports that he has never smoked. He has never used smokeless tobacco. He reports current alcohol use of about 42.0 standard drinks of alcohol per week. He reports current drug use. Frequency: 7.00 times per week. Drug: Marijuana.  Allergies  Allergen Reactions   Lexapro [Escitalopram Oxalate] Other (See Comments)    Lack of therapeutic effect    Zoloft [Sertraline Hcl] Other (See Comments)    Lack of therapeutic effect     Family History  Problem Relation Age of Onset   Migraines Mother    Hypertension Father    Mental illness Sister    Drug abuse Sister    Brain cancer Maternal Grandmother    Heart disease Paternal Grandfather     Prior to Admission medications   Medication Sig Start Date End Date Taking? Authorizing Provider  acamprosate  (CAMPRAL ) 333 MG tablet Take 666 mg by mouth 3 (three) times daily. 02/26/24  Yes [provider]  acetaminophen  (TYLENOL ) 325 MG tablet Take 650 mg by mouth daily as needed for mild pain (pain score 1-3), headache or moderate pain (pain score 4-6).   Yes [provider]  allopurinol  (ZYLOPRIM ) 300 MG tablet Take 300 mg by mouth daily.   Yes [provider]  apixaban  (ELIQUIS ) 5 MG TABS tablet Take 1 tablet (5 mg total) by mouth 2 (two) times daily. 03/09/24  Yes Darlean Ozell NOVAK, MD  atorvastatin  (LIPITOR) 20 MG tablet Take  20 mg by mouth daily.   Yes [provider]  DULoxetine  (CYMBALTA ) 60 MG capsule Take 120 mg by mouth daily.   Yes [provider]  levothyroxine  (SYNTHROID ) 75 MCG tablet Take 75 mcg by mouth daily before breakfast. 12/21/23  Yes [provider]  lisinopril  (ZESTRIL ) 10 MG tablet Take 10 mg by mouth daily. 02/09/24  Yes [provider]  lisinopril -hydrochlorothiazide  (ZESTORETIC ) 20-25 MG tablet Take 1 tablet by mouth daily.   Yes [provider]  Melatonin 5  MG TBDP Take 5 mg by mouth at bedtime as needed (sleep). 02/26/24  Yes [provider]  methocarbamol  (ROBAXIN ) 500 MG tablet Take 500 mg by mouth daily as needed for muscle spasms. 03/08/24  Yes [provider]  propranolol  (INDERAL ) 20 MG tablet Take 20 mg by mouth 3 (three) times daily as needed (Anxiety).   Yes [provider]  traZODone  (DESYREL ) 100 MG tablet Take 100 mg by mouth at bedtime as needed for sleep.   Yes [provider]  chlordiazePOXIDE  (LIBRIUM ) 25 MG capsule 50mg  PO TID x 1D, then 25-50mg  PO BID X 1D, then 25-50mg  PO QD X 1D Patient not taking: Reported on 05/07/2024 05/01/24   Bernis Ernst, PA-C  folic acid  (FOLVITE ) 1 MG tablet Take 1 tablet (1 mg total) by mouth daily. Patient not taking: Reported on 05/07/2024 02/10/24   Sheikh, Omair Latif, DO  naltrexone (DEPADE) 50 MG tablet Take 50 mg by mouth every morning. Patient not taking: Reported on 05/07/2024 03/21/24   [provider]  oxyCODONE  (ROXICODONE ) 5 MG immediate release tablet Take 1 tablet (5 mg total) by mouth every 6 (six) hours as needed. Patient not taking: Reported on 05/07/2024 02/04/24 02/03/25  Ghimire, Kuber, MD  rOPINIRole (REQUIP) 0.5 MG tablet Take 0.5 mg by mouth daily. Patient not taking: Reported on 05/07/2024 02/13/24   [provider]  thiamine  (VITAMIN B-1) 100 MG tablet Take 1 tablet (100 mg total) by mouth daily. Patient not taking: Reported on 05/07/2024 02/10/24   Sherrill Cable Experiment, DO    Physical Exam: Vitals:   05/07/24 0734 05/07/24 0837 05/07/24 0956 05/07/24 1019  BP: (!) 162/116 (!) 157/108    Pulse: 96 91 (!) 118 (!) 110  Resp:  19    Temp:  98.2 F (36.8 C)    TempSrc:  Oral    SpO2: 97% 96%  97%   Physical Exam Vitals and nursing note reviewed.  Constitutional:      Appearance: Normal appearance. He is ill-appearing.  HENT:     Head: Normocephalic.     Nose: No rhinorrhea.     Mouth/Throat:     Mouth: Mucous membranes are moist.   Eyes:     General: No scleral icterus.    Pupils: Pupils are equal, round, and reactive to light.  Neck:     Vascular: No JVD.  Cardiovascular:     Rate and Rhythm: Regular rhythm. Tachycardia present.     Heart sounds: S1 normal and S2 normal.  Pulmonary:     Effort: Pulmonary effort is normal.     Breath sounds: Normal breath sounds. No wheezing or rales.  Abdominal:     General: Bowel sounds are normal. There is no distension.     Palpations: Abdomen is soft.     Tenderness: There is no abdominal tenderness. There is no right CVA tenderness or left CVA tenderness.  Musculoskeletal:     Cervical back: Neck supple.  Right lower leg: No edema.     Left lower leg: No edema.  Skin:    General: Skin is warm and dry.  Neurological:     General: No focal deficit present.     Mental Status: He is alert and oriented to person, place, and time.  Psychiatric:        Attention and Perception: Attention normal. He does not perceive auditory or visual hallucinations.        Mood and Affect: Mood is anxious.        Behavior: Behavior normal. Behavior is cooperative.     Data Reviewed:  Results are pending, will review when available.  EKG: Vent. rate 92 BPM PR interval 135 ms QRS duration 96 ms QT/QTcB 367/454 ms P-R-T axes 8 79 20 Sinus rhythm  Assessment and Plan: Principal Problem:   ETOH abuse Presenting with:   Alcohol withdrawal (HCC) Stepdown/inpatient. CIWA protocol with lorazepam . Begin phenobarbital  taper. No need magnesium  sulfate supplementation. Restart folate, MVI and thiamine . Consult TOC team. The patient stated he has a sponsor for alcohol cessation now.  Active Problems:   History of DVT (deep vein thrombosis) Associated with:   Pulmonary embolism (HCC) Has missed a few doses of apixaban . Will be using Lovenox  while on phenobarbital .    Hypophosphatemia Replacing. Follow level as needed.    Hyperglycemia Check fasting glucose.     Transaminitis Alcohol cessation.    Hypermagnesemia Hold magnesium  supplementation.    Generalized anxiety disorder   Severe major depression (HCC) Associated with history of depression. Hold duloxetine  due to transaminitis.     Acquired hypothyroidism Continue levothyroxine  75 mcg p.o. daily.     Essential hypertension Continue lisinopril  20 mg p.o. daily Continue HCTZ 25 mg p.o. daily. Labetalol  IVP as needed    Mixed hyperlipidemia Hold statin due to abnormal LFTs.     Class 3 obesity Current BMI 44.13 kg/m. Would benefit from lifestyle modifications. Follow-up closely with PCP and/or bariatric clinic.    Advance Care Planning:   Code Status: Full Code   Consults:   Family Communication:   Severity of Illness: The appropriate patient status for this patient is INPATIENT. Inpatient status is judged to be reasonable and necessary in order to provide the required intensity of service to ensure the patient's safety. The patient's presenting symptoms, physical exam findings, and initial radiographic and laboratory data in the context of their chronic comorbidities is felt to place them at high risk for further clinical deterioration. Furthermore, it is not anticipated that the patient will be medically stable for discharge from the hospital within 2 midnights of admission.   * I certify that at the point of admission it is my clinical judgment that the patient will require inpatient hospital care spanning beyond 2 midnights from the point of admission due to high intensity of service, high risk for further deterioration and high frequency of surveillance required.*  Author: Alm Dorn Castor, MD 05/07/2024 10:26 AM  For on call review [email or web address].   This document was prepared using Dragon voice recognition software and may contain some unintended transcription errors.

## 2024-05-08 DIAGNOSIS — F10939 Alcohol use, unspecified with withdrawal, unspecified: Secondary | ICD-10-CM | POA: Diagnosis not present

## 2024-05-08 LAB — COMPREHENSIVE METABOLIC PANEL WITH GFR
ALT: 181 U/L — ABNORMAL HIGH (ref 0–44)
AST: 108 U/L — ABNORMAL HIGH (ref 15–41)
Albumin: 4.4 g/dL (ref 3.5–5.0)
Alkaline Phosphatase: 97 U/L (ref 38–126)
Anion gap: 16 — ABNORMAL HIGH (ref 5–15)
BUN: 6 mg/dL (ref 6–20)
CO2: 24 mmol/L (ref 22–32)
Calcium: 9.2 mg/dL (ref 8.9–10.3)
Chloride: 97 mmol/L — ABNORMAL LOW (ref 98–111)
Creatinine, Ser: 0.83 mg/dL (ref 0.61–1.24)
GFR, Estimated: >60 mL/min (ref >60)
Glucose, Bld: 99 mg/dL (ref 70–99)
Potassium: 3.6 mmol/L (ref 3.5–5.1)
Sodium: 137 mmol/L (ref 135–145)
Total Bilirubin: 0.5 mg/dL (ref 0.0–1.2)
Total Protein: 6.6 g/dL (ref 6.5–8.1)

## 2024-05-08 LAB — CBC
HCT: 47.1 % (ref 39.0–52.0)
Hemoglobin: 14.6 g/dL (ref 13.0–17.0)
MCH: 27.3 pg (ref 26.0–34.0)
MCHC: 31 g/dL (ref 30.0–36.0)
MCV: 88 fL (ref 80.0–100.0)
Platelets: 194 K/uL (ref 150–400)
RBC: 5.35 MIL/uL (ref 4.22–5.81)
RDW: 14.4 % (ref 11.5–15.5)
WBC: 9.4 K/uL (ref 4.0–10.5)
nRBC: 0 % (ref 0.0–0.2)

## 2024-05-08 MED ORDER — LOPERAMIDE HCL 2 MG PO CAPS
2.0000 mg | ORAL_CAPSULE | ORAL | Status: DC | PRN
  Administered 2024-05-08 – 2024-05-09 (×2): 2 mg via ORAL
  Filled 2024-05-08 (×2): qty 1

## 2024-05-08 MED ORDER — ORAL CARE MOUTH RINSE: 15.0000 mL | OROMUCOSAL | Status: DC | PRN

## 2024-05-08 MED ORDER — ALUM & MAG HYDROXIDE-SIMETH 200-200-20 MG/5ML PO SUSP
30.0000 mL | Freq: Four times a day (QID) | ORAL | Status: DC | PRN
  Administered 2024-05-08 – 2024-05-09 (×3): 30 mL via ORAL
  Filled 2024-05-08 (×3): qty 30

## 2024-05-08 NOTE — Progress Notes (Signed)
   05/08/24 0027  Oxygen Therapy  SpO2 (!) (S)  74 % (pt asleep; desat to 74 on room air sustaining good pleth; pt endorses sleep apnea but will not wear cpap in past; accepted [facility])  O2 Device (S)  Nasal Cannula  O2 Flow Rate (L/min) (S)  2 L/min

## 2024-05-08 NOTE — Plan of Care (Signed)

## 2024-05-08 NOTE — Progress Notes (Signed)
   05/08/24 1044  Spiritual Encounters  Type of Visit Initial  Care provided to: Patient  Conversation partners present during encounter Nurse  Referral source Patient request  Reason for visit Advance directives  OnCall Visit No   Responded to spiritual consult to change HCPOA from estranged spouse to patient's mother. Forms left for discussion and completion when mother arrives. Patient had no visitors at time if visit. Discussed with patient's nurse. Patient will notify when documents ready to be processed.

## 2024-05-08 NOTE — Hospital Course (Signed)
 43 y.o. male with medical history significant of GAD, depression, class I obesity, alcohol abuse, who was discharged yesterday after 13-day admission due to acute metabolic encephalopathy in the setting of EtOH withdrawal and serotonin syndrome who returned to the emergency department requesting detox from alcohol.  128 to 144 ounces of beer daily. He denied hallucinations, fever, chills, rhinorrhea, sore throat, wheezing or hemoptysis.  No chest pain, palpitations, diaphoresis, PND, orthopnea or pitting edema of the lower extremities.  No abdominal pain, nausea, emesis, diarrhea, constipation, melena or hematochezia.  No flank pain, dysuria, frequency or hematuria.  No polyuria, polydipsia, polyphagia or blurred vision.    ED course: Initial vital signs were temperature 97.6 F, pulse 103, respiration 18, BP 191/114 mmHg O2 sat 100% on room air.  The patient received LR 1000 mL liter bolus, lorazepam  2 mg IVP and I added labetalol  20 mg IVP.   Lab work: CBC and first troponin level were normal.  Alcohol was 40, magnesium  2.5 and phosphorus 1.9 mg/dL.  CMP showed a glucose of 144 mg/dL, AST 707 and ALT 742 units/L, rest of the CMP measurements were normal.

## 2024-05-08 NOTE — Progress Notes (Signed)
 Progress Note   Patient: Juan Clarke FMW:[id number] DOB: Sep 19, 1980 DOA: 05/07/2024     1 DOS: the patient was seen and examined on 05/08/2024   Brief hospital course: 43 y.o. male with medical history significant of GAD, depression, class I obesity, alcohol abuse, who was discharged yesterday after 13-day admission due to acute metabolic encephalopathy in the setting of EtOH withdrawal and serotonin syndrome who returned to the emergency department requesting detox from alcohol.  128 to 144 ounces of beer daily. He denied hallucinations, fever, chills, rhinorrhea, sore throat, wheezing or hemoptysis.  No chest pain, palpitations, diaphoresis, PND, orthopnea or pitting edema of the lower extremities.  No abdominal pain, nausea, emesis, diarrhea, constipation, melena or hematochezia.  No flank pain, dysuria, frequency or hematuria.  No polyuria, polydipsia, polyphagia or blurred vision.    ED course: Initial vital signs were temperature 97.6 F, pulse 103, respiration 18, BP 191/114 mmHg O2 sat 100% on room air.  The patient received LR 1000 mL liter bolus, lorazepam  2 mg IVP and I added labetalol  20 mg IVP.   Lab work: CBC and first troponin level were normal.  Alcohol was 40, magnesium  2.5 and phosphorus 1.9 mg/dL.  CMP showed a glucose of 144 mg/dL, AST 707 and ALT 742 units/L, rest of the CMP measurements were normal.  Assessment and Plan: Principal Problem:   ETOH abuse Presenting with:   Alcohol withdrawal (HCC) Stepdown/inpatient. Continue CIWA protocol with lorazepam . Cont with phenobarbital  taper that was started on admit CIWA of 15-16 this AM. Pt appropriately conversing and follows commands   Active Problems:   History of DVT (deep vein thrombosis) Associated with:   Pulmonary embolism (HCC) Has missed a few doses of apixaban . Continue Lovenox  while on phenobarbital .     Hypophosphatemia Cont to replace as needed     Hyperglycemia Check fasting glucose.      Transaminitis Alcohol cessation.     Hypermagnesemia Hold magnesium  supplementation.     Generalized anxiety disorder   Severe major depression (HCC) Associated with history of depression. Hold duloxetine  due to transaminitis.     Acquired hypothyroidism Continue levothyroxine  75 mcg p.o. daily.     Essential hypertension Continue lisinopril  20 mg p.o. daily Continue HCTZ 25 mg p.o. daily. Labetalol  IVP as needed     Mixed hyperlipidemia Hold statin due to abnormal LFTs.     Class 3 obesity Current BMI 44.13 kg/m. Would benefit from lifestyle modifications. Follow-up closely with PCP and/or bariatric clinic.         Subjective: Still feeling bad this AM. Appropriately conversant and following commands  Physical Exam: Vitals:   05/08/24 1108 05/08/24 1200 05/08/24 1227 05/08/24 1353  BP: (!) 141/54 (!) 174/113 (!) 182/109   Pulse: (!) 101 (!) 111 (!) 124 89  Resp:  (!) 21 (!) 28   Temp:   97.9 F (36.6 C)   TempSrc:   Oral   SpO2:  98% 99%   Weight:      Height:       General exam: Awake, laying in bed, in nad, appears diaphoretic Respiratory system: Normal respiratory effort, no wheezing Cardiovascular system: regular rate, s1, s2 Gastrointestinal system: Soft, nondistended, positive BS Central nervous system: CN2-12 grossly intact, strength intact Extremities: Perfused, no clubbing Skin: Normal skin turgor, no notable skin lesions seen Psychiatry: Mood normal // no visual hallucinations   Data Reviewed:  Labs reviewed: Na 137, K 3.6, Cr 0.83, AST 108, ALT 181, WBC 9.4, Hgb 14.6, Plts 194  Family Communication: Pt in room, family not at bedside  Disposition: Status is: Inpatient Remains inpatient appropriate because: severity of illness  Planned Discharge Destination: Home     Author: Garnette Pelt, MD 05/08/2024 3:02 PM  For on call review [email or web address].

## 2024-05-08 NOTE — Progress Notes (Signed)
   05/08/24 1209  Spiritual Encounters  Type of Visit Follow up  Reason for visit Advance directives  OnCall Visit No   Attempted to follow up on earlier visit to discuss HCPOA, however patient was sleeping.

## 2024-05-09 DIAGNOSIS — F10930 Alcohol use, unspecified with withdrawal, uncomplicated: Secondary | ICD-10-CM | POA: Diagnosis not present

## 2024-05-09 LAB — CBC
HCT: 48 % (ref 39.0–52.0)
Hemoglobin: 15.3 g/dL (ref 13.0–17.0)
MCH: 27.9 pg (ref 26.0–34.0)
MCHC: 31.9 g/dL (ref 30.0–36.0)
MCV: 87.4 fL (ref 80.0–100.0)
Platelets: 188 K/uL (ref 150–400)
RBC: 5.49 MIL/uL (ref 4.22–5.81)
RDW: 14.1 % (ref 11.5–15.5)
WBC: 8.4 K/uL (ref 4.0–10.5)
nRBC: 0 % (ref 0.0–0.2)

## 2024-05-09 LAB — COMPREHENSIVE METABOLIC PANEL WITH GFR
ALT: 120 U/L — ABNORMAL HIGH (ref 0–44)
AST: 49 U/L — ABNORMAL HIGH (ref 15–41)
Albumin: 4.3 g/dL (ref 3.5–5.0)
Alkaline Phosphatase: 105 U/L (ref 38–126)
Anion gap: 18 — ABNORMAL HIGH (ref 5–15)
BUN: 11 mg/dL (ref 6–20)
CO2: 21 mmol/L — ABNORMAL LOW (ref 22–32)
Calcium: 9.5 mg/dL (ref 8.9–10.3)
Chloride: 97 mmol/L — ABNORMAL LOW (ref 98–111)
Creatinine, Ser: 0.83 mg/dL (ref 0.61–1.24)
GFR, Estimated: >60 mL/min (ref >60)
Glucose, Bld: 92 mg/dL (ref 70–99)
Potassium: 3.6 mmol/L (ref 3.5–5.1)
Sodium: 135 mmol/L (ref 135–145)
Total Bilirubin: 0.5 mg/dL (ref 0.0–1.2)
Total Protein: 6.7 g/dL (ref 6.5–8.1)

## 2024-05-09 LAB — MAGNESIUM: Magnesium: 2.4 mg/dL (ref 1.7–2.4)

## 2024-05-09 MED ORDER — ACAMPROSATE CALCIUM 333 MG PO TBEC: 666.0000 mg | DELAYED_RELEASE_TABLET | Freq: Three times a day (TID) | ORAL | 2 refills | Status: DC

## 2024-05-09 MED ORDER — CHLORDIAZEPOXIDE HCL 25 MG PO CAPS: ORAL_CAPSULE | ORAL | 0 refills | Status: DC

## 2024-05-09 NOTE — Discharge Instructions (Signed)
 FOOD PANTRY [facility] [address] [phone]  [facility] [address] [place] [phone]  [facility] [employer] [address] [place] [phone]  [facility] [address] [phone]  [facility] [address] [place], [place] [address] [phone]

## 2024-05-09 NOTE — Discharge Summary (Signed)
 Physician Discharge Summary  Jeff Frieden Dehaan FMW:[id number] DOB: 08/24/1981 DOA: 05/07/2024  PCP: Cleotilde Planas, MD  Admit date: 05/07/2024 Discharge date: 05/09/2024  Admitted From: Home Disposition: Home  Recommendations for Outpatient Follow-up:  Follow up with PCP in 1-2 weeks Continue Librium  taper on discharge for EtOH withdrawal Recommend repeat CMP 1 week to reassess LFTs  Home Health: No Equipment/Devices: None  Discharge Condition: Stable CODE STATUS: Full code Diet recommendation: Heart healthy diet  History of present illness:  Juan Clarke is a 43 year old male with past medical history significant for GAD, depression, alcohol use disorder, obesity who presented to [facility] ED on 05/07/2024 requesting inpatient detox from alcohol.  Patient endorses 128-144 ounces of beer daily, denies any history of seizure activity but has significant withdrawal symptoms.  Denies hallucinations, no fever, no chills, no sore throat, no shortness of breath, no chest pain, no palpitations, no urinary symptoms.  In the ED, temperature 97.6 F,  HR 103, RR 18, BP 191/114, SpO2 100% on room air.  WBC 4.4, hemoglobin 14.8, platelet count 192.  Sodium 138, potassium 4.5, chloride 103, CO2 22, glucose 144, BUN 10, creatinine 0.82.  AST 292, ALT 257, total bilirubin 0.3.  High-sensitivity troponin less than 15.  EtOH level 40.  Chest x-ray with interval resolution of right lower lung airspace opacity, no acute findings.  Patient received 1 L LR bolus, lorazepam  2 mg IVP, labetalol  20 mg IVP.  TRH consulted for admission for further evaluation management of EtOH withdrawal.  Hospital course:  EtOH withdrawal Patient with continued significant alcohol use, drinking between 128-144 ounces of beer daily.  No previous history of seizure activity but does endorse significant withdrawal symptoms such as agitation, tremulousness.  Patient with noted elevation in LFTs, EtOH level 40 on admission.  Patient was  admitted and monitored under CIWA protocol with symptom triggered lorazepam  and started on a phenobarbital  taper.  Patient's CIWA scores peaked at 16 and slowly improved during his hospitalization as well as his symptoms.  Patient will continue Librium  taper on discharge.  Patient was given substance abuse resources.  Continue to encourage complete alcohol cessation/abstinence.  Hypophosphatemia Repleted.  Transaminitis Etiology likely secondary to alcohol use disorder.  LFTs were monitored and slowly improved at time of discharge.  AST improved to 49 with ALT 128 on discharge.  Recommend repeat CMP 1 week.  History of DVT/PE Continue apixaban .  Generalized anxiety disorder/severe major depressive disorder Continue duloxetine   Acquired hypothyroidism Continue levothyroxine  75 mcg p.o. daily  HTN Continue lisinopril  30 mg p.o. daily, hydrochlorothiazide  25 mg p.o. daily  HLD May resume statin on discharge.  Recommend repeat CMP as above.  Obesity, class III Body mass index is 44.13 kg/m.    Discharge Diagnoses:  Principal Problem:   Alcohol withdrawal (HCC) Active Problems:   Generalized anxiety disorder   ETOH abuse   Pulmonary embolism (HCC)   Acquired hypothyroidism   Essential hypertension   Mixed hyperlipidemia   Class 3 obesity   History of DVT (deep vein thrombosis)   Hypophosphatemia   Hyperglycemia   Severe major depression (HCC)   Transaminitis   Hypermagnesemia    Discharge Instructions  Discharge Instructions     Call MD for:  difficulty breathing, headache or visual disturbances   Complete by: As directed    Call MD for:  extreme fatigue   Complete by: As directed    Call MD for:  persistant dizziness or light-headedness   Complete by: As directed  Call MD for:  persistant nausea and vomiting   Complete by: As directed    Call MD for:  severe uncontrolled pain   Complete by: As directed    Call MD for:  temperature >100.4   Complete by: As  directed    Diet - low sodium heart healthy   Complete by: As directed    Increase activity slowly   Complete by: As directed       Allergies as of 05/09/2024       Reactions   Lexapro [escitalopram Oxalate] Other (See Comments)   Lack of therapeutic effect    Zoloft [sertraline Hcl] Other (See Comments)   Lack of therapeutic effect         Medication List     STOP taking these medications    folic acid  1 MG tablet Commonly known as: FOLVITE    naltrexone 50 MG tablet Commonly known as: DEPADE   oxyCODONE  5 MG immediate release tablet Commonly known as: Roxicodone    rOPINIRole 0.5 MG tablet Commonly known as: REQUIP   thiamine  100 MG tablet Commonly known as: VITAMIN B1       TAKE these medications    acamprosate  333 MG tablet Commonly known as: CAMPRAL  Take 2 tablets (666 mg total) by mouth 3 (three) times daily.   acetaminophen  325 MG tablet Commonly known as: TYLENOL  Take 650 mg by mouth daily as needed for mild pain (pain score 1-3), headache or moderate pain (pain score 4-6).   allopurinol  300 MG tablet Commonly known as: ZYLOPRIM  Take 300 mg by mouth daily.   apixaban  5 MG Tabs tablet Commonly known as: Eliquis  Take 1 tablet (5 mg total) by mouth 2 (two) times daily.   atorvastatin  20 MG tablet Commonly known as: LIPITOR Take 20 mg by mouth daily.   chlordiazePOXIDE  25 MG capsule Commonly known as: LIBRIUM  Take 2 capsules (50 mg total) by mouth 2 (two) times daily for 1 day, THEN 1 capsule (25 mg total) 2 (two) times daily for 1 day, THEN 1 capsule (25 mg total) daily for 1 day. Start taking on: May 09, 2024 What changed: See the new instructions.   DULoxetine  60 MG capsule Commonly known as: CYMBALTA  Take 120 mg by mouth daily.   levothyroxine  75 MCG tablet Commonly known as: SYNTHROID  Take 75 mcg by mouth daily before breakfast.   lisinopril  10 MG tablet Commonly known as: ZESTRIL  Take 10 mg by mouth daily.    lisinopril -hydrochlorothiazide  20-25 MG tablet Commonly known as: ZESTORETIC  Take 1 tablet by mouth daily.   Melatonin 5 MG Tbdp Take 5 mg by mouth at bedtime as needed (sleep).   methocarbamol  500 MG tablet Commonly known as: ROBAXIN  Take 500 mg by mouth daily as needed for muscle spasms.   propranolol  20 MG tablet Commonly known as: INDERAL  Take 20 mg by mouth 3 (three) times daily as needed (Anxiety).   traZODone  100 MG tablet Commonly known as: DESYREL  Take 100 mg by mouth at bedtime as needed for sleep.        Follow-up Information     Cleotilde Planas, MD. Schedule an appointment as soon as possible for a visit in 1 week(s).   Specialty: Family Medicine Contact information: [address] [place] [address] [phone]                Allergies  Allergen Reactions   Lexapro [Escitalopram Oxalate] Other (See Comments)    Lack of therapeutic effect    Zoloft [Sertraline  Hcl] Other (See Comments)    Lack of therapeutic effect     Consultations: None   Procedures/Studies: DG Chest Port 1 View Result Date: 05/07/2024 EXAM: 1 VIEW XRAY OF THE CHEST 05/07/2024 07:17:00 AM COMPARISON: 02/08/2024 CLINICAL HISTORY: CP. Per chart Pt reports that he wants detox from alcohol. FINDINGS: LUNGS AND PLEURA: Interval resolution of right lower lung airspace opacity. No pulmonary edema. No pleural effusion. No pneumothorax. HEART AND MEDIASTINUM: No acute abnormality of the cardiac and mediastinal silhouettes. BONES AND SOFT TISSUES: No acute osseous abnormality. IMPRESSION: 1. Interval resolution of right lower lung airspace opacity. 2. No acute findings. Electronically signed by: Waddell Calk MD 05/07/2024 07:24 AM EDT RP Workstation: [id number]     Subjective: Patient seen examined bedside, lying in bed.  His withdrawal symptoms have resolved.  Requesting work note.  Discussed need for complete abstinence/cessation from alcohol use in the future, agreeable.  No  other questions or concerns at this time.  Denies headache, no dizziness, no chest pain, no palpitations, no shortness of breath, no abdominal pain, no fever/chills/night sweats, no nausea/vomiting/diarrhea, no focal weakness, no fatigue, no paresthesias.  No acute events overnight per nursing staff.  Discharge Exam: Vitals:   05/09/24 1000 05/09/24 1100  BP: (!) 143/81 135/79  Pulse: (!) 112 (!) 110  Resp:  20  Temp:    SpO2: 95% 96%   Vitals:   05/09/24 0900 05/09/24 0945 05/09/24 1000 05/09/24 1100  BP:   (!) 143/81 135/79  Pulse: (!) 107 (!) 117 (!) 112 (!) 110  Resp: (!) 21   20  Temp:      TempSrc:      SpO2: 95%  95% 96%  Weight:      Height:        Physical Exam: GEN: NAD, alert and oriented x 3, obese HEENT: NCAT, PERRL, EOMI, sclera clear, MMM PULM: CTAB w/o wheezes/crackles, normal respiratory effort, on room air CV: RRR w/o M/G/R GI: abd soft, NTND, NABS, no R/G/M MSK: no peripheral edema, moves all extremity independently NEURO: CN II-XII intact, no focal deficits, sensation to light touch intact PSYCH: normal mood/affect Integumentary: dry/intact, no rashes or wounds    The results of significant diagnostics from this hospitalization (including imaging, microbiology, ancillary and laboratory) are listed below for reference.     Microbiology: Recent Results (from the past 240 hours)  MRSA Next Gen by PCR, Nasal     Status: None   Collection Time: 05/07/24  8:45 PM   Specimen: Nasal Mucosa; Nasal Swab  Result Value Ref Range Status   MRSA by PCR Next Gen NOT DETECTED NOT DETECTED Final    Comment: (NOTE) The GeneXpert MRSA Assay (FDA approved for NASAL specimens only), is one component of a comprehensive MRSA colonization surveillance program. It is not intended to diagnose MRSA infection nor to guide or monitor treatment for MRSA infections. Test performance is not FDA approved in patients less than 4 years old. Performed at [facility], [address], [place], [place] [address]   C Difficile Quick Screen w PCR reflex     Status: None   Collection Time: 05/07/24  9:34 PM   Specimen: STOOL  Result Value Ref Range Status   C Diff antigen NEGATIVE NEGATIVE Final   C Diff toxin NEGATIVE NEGATIVE Final   C Diff interpretation No C. difficile detected.  Final    Comment: Performed at [facility], [address] [place], [place] [address]  Labs: BNP (last 3 results) Recent Labs    02/07/24 1933  BNP 8.9   Basic Metabolic Panel: Recent Labs  Lab 05/07/24 0835 05/08/24 0312 05/09/24 0309  NA 138 137 135  K 4.5 3.6 3.6  CL 103 97* 97*  CO2 22 24 21*  GLUCOSE 144* 99 92  BUN 10 6 11   CREATININE 0.82 0.83 0.83  CALCIUM  9.1 9.2 9.5  MG 2.5*  --  2.4  PHOS 1.9*  --   --    Liver Function Tests: Recent Labs  Lab 05/07/24 0835 05/08/24 0312 05/09/24 0309  AST 292* 108* 49*  ALT 257* 181* 120*  ALKPHOS 92 97 105  BILITOT 0.3 0.5 0.5  PROT 6.5 6.6 6.7  ALBUMIN 4.3 4.4 4.3   No results for input(s): LIPASE, AMYLASE in the last 168 hours. No results for input(s): AMMONIA in the last 168 hours. CBC: Recent Labs  Lab 05/07/24 0659 05/08/24 0312 05/09/24 0309  WBC 4.4 9.4 8.4  NEUTROABS 2.4  --   --   HGB 14.8 14.6 15.3  HCT 46.7 47.1 48.0  MCV 87.5 88.0 87.4  PLT 192 194 188   Cardiac Enzymes: No results for input(s): CKTOTAL, CKMB, CKMBINDEX, TROPONINI in the last 168 hours. BNP: Invalid input(s): POCBNP CBG: No results for input(s): GLUCAP in the last 168 hours. D-Dimer No results for input(s): DDIMER in the last 72 hours. Hgb A1c No results for input(s): HGBA1C in the last 72 hours. Lipid Profile No results for input(s): CHOL, HDL, LDLCALC, TRIG, CHOLHDL, LDLDIRECT in the last 72 hours. Thyroid function studies No results for input(s): TSH, T4TOTAL, T3FREE, THYROIDAB in the last 72 hours.  Invalid input(s):  FREET3 Anemia work up No results for input(s): VITAMINB12, FOLATE, FERRITIN, TIBC, IRON, RETICCTPCT in the last 72 hours. Urinalysis    Component Value Date/Time   COLORURINE YELLOW 02/08/2024 0452   APPEARANCEUR CLEAR 02/08/2024 0452   LABSPEC 1.010 02/08/2024 0452   PHURINE 6.0 02/08/2024 0452   GLUCOSEU NEGATIVE 02/08/2024 0452   HGBUR NEGATIVE 02/08/2024 0452   BILIRUBINUR NEGATIVE 02/08/2024 0452   KETONESUR NEGATIVE 02/08/2024 0452   PROTEINUR NEGATIVE 02/08/2024 0452   NITRITE NEGATIVE 02/08/2024 0452   LEUKOCYTESUR NEGATIVE 02/08/2024 0452   Sepsis Labs Recent Labs  Lab 05/07/24 0659 05/08/24 0312 05/09/24 0309  WBC 4.4 9.4 8.4   Microbiology Recent Results (from the past 240 hours)  MRSA Next Gen by PCR, Nasal     Status: None   Collection Time: 05/07/24  8:45 PM   Specimen: Nasal Mucosa; Nasal Swab  Result Value Ref Range Status   MRSA by PCR Next Gen NOT DETECTED NOT DETECTED Final    Comment: (NOTE) The GeneXpert MRSA Assay (FDA approved for NASAL specimens only), is one component of a comprehensive MRSA colonization surveillance program. It is not intended to diagnose MRSA infection nor to guide or monitor treatment for MRSA infections. Test performance is not FDA approved in patients less than 16 years old. Performed at [facility], [address] [place], [place] [address]   C Difficile Quick Screen w PCR reflex     Status: None   Collection Time: 05/07/24  9:34 PM   Specimen: STOOL  Result Value Ref Range Status   C Diff antigen NEGATIVE NEGATIVE Final   C Diff toxin NEGATIVE NEGATIVE Final   C Diff interpretation No C. difficile detected.  Final    Comment: Performed at [facility], [address]  [place], [place] [address]     Time coordinating discharge: Over 30 minutes  SIGNED:   Camellia PARAS [place], DO  [facility] 05/09/2024, 12:27 PM

## 2024-05-09 NOTE — Progress Notes (Signed)
   05/09/24 1250  TOC Brief Assessment  Insurance and Status Reviewed ([employer] / [facility])  Patient has primary care physician Yes Ted, Olam, MD)  Home environment has been reviewed Single Family Home  Prior level of function: Independent  Prior/Current Home Services No current home services  Social Drivers of Health Review SDOH reviewed interventions complete  Readmission risk has been reviewed Yes  Transition of care needs no transition of care needs at this time   Pt screened. IP Care Management consulted for Substance use counseling. NCM met with pt and pt states that he will follow up with an IOP program in [place] through [facility]. Denies any further needs at this time. There are no other IP Care Management needs at this time.

## 2024-05-09 NOTE — Progress Notes (Signed)
   05/09/24 0233  Oxygen Therapy  SpO2 (!) (S)  76 %  O2 Device (S)  Nasal Cannula  O2 Flow Rate (L/min) (S)  2 L/min   Pt asleep and desat to 76% on RA, sustaining good pleth; pt still endorses sleep apea and has no interest in wearing a cpap - states it makes him anxious.  Pt accepted [place] 2L with + effect.

## 2024-05-11 ENCOUNTER — Emergency Department (HOSPITAL_COMMUNITY)

## 2024-05-11 ENCOUNTER — Encounter (HOSPITAL_COMMUNITY): Payer: Self-pay

## 2024-05-11 ENCOUNTER — Emergency Department (HOSPITAL_COMMUNITY)
Admission: EM | Admit: 2024-05-11 | Discharge: 2024-05-11 | Disposition: A | Discharge status: Home / Self Care | Attending: Emergency Medicine | Admitting: Emergency Medicine

## 2024-05-11 DIAGNOSIS — F10239 Alcohol dependence with withdrawal, unspecified: Secondary | ICD-10-CM | POA: Diagnosis present

## 2024-05-11 DIAGNOSIS — F10939 Alcohol use, unspecified with withdrawal, unspecified: Secondary | ICD-10-CM

## 2024-05-11 LAB — URINE DRUG SCREEN
Amphetamines: NEGATIVE
Barbiturates: POSITIVE — AB
Benzodiazepines: POSITIVE — AB
Cocaine: NEGATIVE
Fentanyl: NEGATIVE
Methadone Scn, Ur: NEGATIVE
Opiates: NEGATIVE
Tetrahydrocannabinol: NEGATIVE

## 2024-05-11 LAB — CBC
HCT: 45.4 % (ref 39.0–52.0)
Hemoglobin: 15.4 g/dL (ref 13.0–17.0)
MCH: 29 pg (ref 26.0–34.0)
MCHC: 33.9 g/dL (ref 30.0–36.0)
MCV: 85.5 fL (ref 80.0–100.0)
Platelets: 223 K/uL (ref 150–400)
RBC: 5.31 MIL/uL (ref 4.22–5.81)
RDW: 14.4 % (ref 11.5–15.5)
WBC: 10 K/uL (ref 4.0–10.5)
nRBC: 0 % (ref 0.0–0.2)

## 2024-05-11 LAB — BASIC METABOLIC PANEL WITH GFR
Anion gap: 16 — ABNORMAL HIGH (ref 5–15)
BUN: 17 mg/dL (ref 6–20)
CO2: 22 mmol/L (ref 22–32)
Calcium: 9.5 mg/dL (ref 8.9–10.3)
Chloride: 98 mmol/L (ref 98–111)
Creatinine, Ser: 1.02 mg/dL (ref 0.61–1.24)
GFR, Estimated: >60 mL/min (ref >60)
Glucose, Bld: 116 mg/dL — ABNORMAL HIGH (ref 70–99)
Potassium: 3.9 mmol/L (ref 3.5–5.1)
Sodium: 136 mmol/L (ref 135–145)

## 2024-05-11 LAB — TROPONIN T, HIGH SENSITIVITY
Troponin T High Sensitivity: 16 ng/L (ref 0–19)
Troponin T High Sensitivity: <15 ng/L (ref 0–19)

## 2024-05-11 MED ORDER — THIAMINE MONONITRATE 100 MG PO TABS: 100.0000 mg | ORAL_TABLET | Freq: Every day | ORAL | Status: DC

## 2024-05-11 MED ORDER — LORAZEPAM 1 MG PO TABS: 0.0000 mg | ORAL_TABLET | Freq: Four times a day (QID) | ORAL | Status: DC

## 2024-05-11 MED ORDER — ONDANSETRON HCL 4 MG/2ML IJ SOLN
4.0000 mg | Freq: Once | INTRAMUSCULAR | Status: AC
  Administered 2024-05-11: 4 mg via INTRAVENOUS
  Filled 2024-05-11: qty 2

## 2024-05-11 MED ORDER — LORAZEPAM 2 MG/ML IJ SOLN: 0.0000 mg | Freq: Two times a day (BID) | INTRAMUSCULAR | Status: DC

## 2024-05-11 MED ORDER — LORAZEPAM 1 MG PO TABS: 0.0000 mg | ORAL_TABLET | Freq: Two times a day (BID) | ORAL | Status: DC

## 2024-05-11 MED ORDER — SODIUM CHLORIDE 0.9 % IV BOLUS
1000.0000 mL | Freq: Once | INTRAVENOUS | Status: AC
  Administered 2024-05-11: 1000 mL via INTRAVENOUS

## 2024-05-11 MED ORDER — CHLORDIAZEPOXIDE HCL 25 MG PO CAPS
ORAL_CAPSULE | ORAL | 0 refills | Status: DC
Start: 2024-05-11 — End: 2024-05-28

## 2024-05-11 MED ORDER — LORAZEPAM 2 MG/ML IJ SOLN
0.0000 mg | Freq: Four times a day (QID) | INTRAMUSCULAR | Status: DC
  Administered 2024-05-11: 2 mg via INTRAVENOUS
  Filled 2024-05-11: qty 1

## 2024-05-11 MED ORDER — THIAMINE HCL 100 MG/ML IJ SOLN
100.0000 mg | Freq: Every day | INTRAMUSCULAR | Status: DC
  Administered 2024-05-11: 100 mg via INTRAVENOUS
  Filled 2024-05-11: qty 2

## 2024-05-11 NOTE — ED Triage Notes (Addendum)
 Patient arrived stating he normally has 6 24oz 8% alcohol drinks a day. Attempting to detox, his last drink was 1130pm tonight. Reporting chest pain, shakes, diaphoretic, nausea, diarrhea, body aches, and tingling.

## 2024-05-11 NOTE — ED Provider Notes (Signed)
 [facility] DEPART[facility] AT [facility] Provider Note   CSN: [id number] Arrival date & time: 05/11/24  9473     Patient presents with: Withdrawal  HPI Juan Clarke is a 43 y.o. male history of alcohol abuse, PE/DVT on Eliquis , recent admission for DTs and serotonin syndrome presenting for withdrawal.  He states he last drink at around 11:30 PM last night.  He states he drank his usual six 24 ounce bottles of beer.  Was recently discharged for alcohol withdrawal and states he took his last dose of Librium  yesterday morning.  Also reports some chest pain in the left side of his chest that is intermittent without shortness of breath that feels like a throbbing pain.  States he wants to quit drinking alcohol but is not know how.  States he has been compliant taking his Eliquis . Denies SI/HI and hallucinations.    HPI     Prior to Admission medications   Medication Sig Start Date End Date Taking? Authorizing Provider  chlordiazePOXIDE  (LIBRIUM ) 25 MG capsule 50mg  PO TID x 1D, then 25-50mg  PO BID X 1D, then 25-50mg  PO QD X 1D 05/11/24  Yes Lang, Kaiden Pech K, PA-C  acamprosate  (CAMPRAL ) 333 MG tablet Take 2 tablets (666 mg total) by mouth 3 (three) times daily. 05/09/24 08/07/24  [place], Eric J, DO  acetaminophen  (TYLENOL ) 325 MG tablet Take 650 mg by mouth daily as needed for mild pain (pain score 1-3), headache or moderate pain (pain score 4-6).    [provider]  allopurinol  (ZYLOPRIM ) 300 MG tablet Take 300 mg by mouth daily.    [provider]  apixaban  (ELIQUIS ) 5 MG TABS tablet Take 1 tablet (5 mg total) by mouth 2 (two) times daily. 03/09/24   Darlean Ozell NOVAK, MD  atorvastatin  (LIPITOR) 20 MG tablet Take 20 mg by mouth daily.    [provider]  DULoxetine  (CYMBALTA ) 60 MG capsule Take 120 mg by mouth daily.    [provider]  levothyroxine  (SYNTHROID ) 75 MCG tablet Take 75 mcg by mouth daily before breakfast. 12/21/23    [provider]  lisinopril  (ZESTRIL ) 10 MG tablet Take 10 mg by mouth daily. 02/09/24   [provider]  lisinopril -hydrochlorothiazide  (ZESTORETIC ) 20-25 MG tablet Take 1 tablet by mouth daily.    [provider]  Melatonin 5 MG TBDP Take 5 mg by mouth at bedtime as needed (sleep). 02/26/24   [provider]  methocarbamol  (ROBAXIN ) 500 MG tablet Take 500 mg by mouth daily as needed for muscle spasms. 03/08/24   [provider]  propranolol  (INDERAL ) 20 MG tablet Take 20 mg by mouth 3 (three) times daily as needed (Anxiety).    [provider]  traZODone  (DESYREL ) 100 MG tablet Take 100 mg by mouth at bedtime as needed for sleep.    [provider]    Allergies: Lexapro [escitalopram oxalate] and Zoloft [sertraline hcl]    Review of Systems See HPI   Physical Exam   Vitals:   05/11/24 0847 05/11/24 1020  BP: 134/88 (!) 143/89  Pulse: 98 93  Resp:  16  Temp:  97.6 F (36.4 C)  SpO2:  97%    CONSTITUTIONAL:  well-appearing, NAD NEURO:  Alert and oriented x 3, CN 3-12 grossly intact EYES:  eyes equal and reactive ENT/NECK:  Supple, no stridor  CARDIO:  tachycardia and regular rhythm, appears well-perfused  PULM:  No respiratory distress, CTAB GI/GU:  non-distended, soft MSK/SPINE:  No gross deformities,  no edema, moves all extremities  SKIN:  no rash, atraumatic  *Additional and/or pertinent findings included in MDM below    (all labs ordered are listed, but only abnormal results are displayed) Labs Reviewed  BASIC METABOLIC PANEL WITH GFR - Abnormal; Notable for the following components:      Result Value   Glucose, Bld 116 (*)    Anion gap 16 (*)    All other components within normal limits  URINE DRUG SCREEN - Abnormal; Notable for the following components:   Benzodiazepines POSITIVE (*)    Barbiturates POSITIVE (*)    All other components within normal limits  CBC  ETHANOL  TROPONIN T, HIGH SENSITIVITY   TROPONIN T, HIGH SENSITIVITY    EKG: None  Radiology: DG Chest 2 View Result Date: 05/11/2024 EXAM: 2 VIEW(S) XRAY OF THE CHEST 05/11/2024 06:03:42 AM COMPARISON: 05/07/2024 CLINICAL HISTORY: Chest pain. Upper left chest pain off and on for past few days. FINDINGS: LUNGS AND PLEURA: No focal pulmonary opacity. No pulmonary edema. No pleural effusion. No pneumothorax. HEART AND MEDIASTINUM: No acute abnormality of the cardiac and mediastinal silhouettes. BONES AND SOFT TISSUES: No acute osseous abnormality. IMPRESSION: 1. No acute process. Electronically signed by: Waddell Calk MD 05/11/2024 06:29 AM EDT RP Workstation: [id number]     Procedures   Medications Ordered in the ED  LORazepam  (ATIVAN ) injection 0-4 mg (2 mg Intravenous Given 05/11/24 0835)    Or  LORazepam  (ATIVAN ) tablet 0-4 mg ( Oral See Alternative 05/11/24 0835)  LORazepam  (ATIVAN ) injection 0-4 mg (has no administration in time range)    Or  LORazepam  (ATIVAN ) tablet 0-4 mg (has no administration in time range)  thiamine  (VITAMIN B1) tablet 100 mg ( Oral See Alternative 05/11/24 1001)    Or  thiamine  (VITAMIN B1) injection 100 mg (100 mg Intravenous Given 05/11/24 1001)  sodium chloride  0.9 % bolus 1,000 mL (0 mLs Intravenous Stopped 05/11/24 1002)  ondansetron  (ZOFRAN ) injection 4 mg (4 mg Intravenous Given 05/11/24 0834)                                    Medical Decision Making Amount and/or Complexity of Data Reviewed Labs: ordered. Radiology: ordered.  Risk OTC drugs. Prescription drug management.   43 year old well-appearing male presenting for alcohol withdrawal.  Exam notable for tachycardia but otherwise reassuring.  DDx includes alcohol withdrawal, delirium tremens, sepsis, PE, other.  Initial CIWA was 11.  Gave 2 mg of IV Ativan .  Rechecked his CIWA about 2 hours later and was 10.  Heart rate also normalized.  Patient did have recent hospital admission for alcohol withdrawal and prior to that admission  for delirium tremens and serotonin syndrome.  He has expressed to me a strong desire to quit drinking.  We discussed that this encounter does not necessarily indicate that he needs admission and would likely be more appropriate for him to go to rehab facility.  Which he agreed.  I did send another course of Librium  to his pharmacy and advised him to reach out to the multiple rehab facilities here in [place].  Discussed return precautions.  Discharged.     Final diagnoses:  Alcohol withdrawal syndrome with complication [facility])    ED Discharge Orders          Ordered    chlordiazePOXIDE  (LIBRIUM ) 25 MG capsule        05/11/24 0848  Lang Norleen POUR, PA-C 05/11/24 1037    Bari Charmaine FALCON, MD 05/12/24 2330

## 2024-05-11 NOTE — Discharge Instructions (Addendum)
 [facility] 4.2(100)  Addiction treatment center [address]  [phone]   [facility] [place], [place] [phone])  Addiction treatment center [address] [facility] [place]  [phone]   Encounter today revealed that you are not likely withdrawing somewhat from alcohol.  I sent a Librium  course to your pharmacy.  Feel at this time hospitalization is not indicated.  Would strongly recommend that you reach out to a local detox facility.  If your symptoms worsen please return to the ED for further evaluation.

## 2024-05-27 ENCOUNTER — Other Ambulatory Visit: Payer: Self-pay

## 2024-05-27 ENCOUNTER — Inpatient Hospital Stay (HOSPITAL_COMMUNITY)
Admission: EM | Admit: 2024-05-27 | Discharge: 2024-05-30 | DRG: 897 | Disposition: A | Discharge status: Home / Self Care | Attending: Internal Medicine | Admitting: Internal Medicine

## 2024-05-27 ENCOUNTER — Encounter (HOSPITAL_COMMUNITY): Payer: Self-pay | Admitting: *Deleted

## 2024-05-27 DIAGNOSIS — E876 Hypokalemia: Secondary | ICD-10-CM | POA: Diagnosis present

## 2024-05-27 DIAGNOSIS — F10239 Alcohol dependence with withdrawal, unspecified: Secondary | ICD-10-CM | POA: Diagnosis not present

## 2024-05-27 DIAGNOSIS — Z79899 Other long term (current) drug therapy: Secondary | ICD-10-CM

## 2024-05-27 DIAGNOSIS — I10 Essential (primary) hypertension: Secondary | ICD-10-CM | POA: Diagnosis present

## 2024-05-27 DIAGNOSIS — Z8249 Family history of ischemic heart disease and other diseases of the circulatory system: Secondary | ICD-10-CM

## 2024-05-27 DIAGNOSIS — Z86711 Personal history of pulmonary embolism: Secondary | ICD-10-CM

## 2024-05-27 DIAGNOSIS — E782 Mixed hyperlipidemia: Secondary | ICD-10-CM | POA: Diagnosis present

## 2024-05-27 DIAGNOSIS — Z888 Allergy status to other drugs, medicaments and biological substances status: Secondary | ICD-10-CM

## 2024-05-27 DIAGNOSIS — E66813 Obesity, class 3: Secondary | ICD-10-CM | POA: Diagnosis present

## 2024-05-27 DIAGNOSIS — R7401 Elevation of levels of liver transaminase levels: Secondary | ICD-10-CM | POA: Diagnosis present

## 2024-05-27 DIAGNOSIS — F1020 Alcohol dependence, uncomplicated: Secondary | ICD-10-CM | POA: Diagnosis present

## 2024-05-27 DIAGNOSIS — F10939 Alcohol use, unspecified with withdrawal, unspecified: Secondary | ICD-10-CM | POA: Diagnosis not present

## 2024-05-27 DIAGNOSIS — E039 Hypothyroidism, unspecified: Secondary | ICD-10-CM | POA: Diagnosis present

## 2024-05-27 DIAGNOSIS — Y906 Blood alcohol level of 120-199 mg/100 ml: Secondary | ICD-10-CM | POA: Diagnosis present

## 2024-05-27 DIAGNOSIS — G4700 Insomnia, unspecified: Secondary | ICD-10-CM | POA: Diagnosis present

## 2024-05-27 DIAGNOSIS — I2699 Other pulmonary embolism without acute cor pulmonale: Secondary | ICD-10-CM | POA: Diagnosis present

## 2024-05-27 DIAGNOSIS — F411 Generalized anxiety disorder: Secondary | ICD-10-CM | POA: Diagnosis present

## 2024-05-27 DIAGNOSIS — Z79890 Hormone replacement therapy (postmenopausal): Secondary | ICD-10-CM

## 2024-05-27 DIAGNOSIS — M109 Gout, unspecified: Secondary | ICD-10-CM | POA: Diagnosis present

## 2024-05-27 DIAGNOSIS — Z6841 Body Mass Index (BMI) 40.0 and over, adult: Secondary | ICD-10-CM

## 2024-05-27 DIAGNOSIS — Z7901 Long term (current) use of anticoagulants: Secondary | ICD-10-CM

## 2024-05-27 DIAGNOSIS — F32A Depression, unspecified: Secondary | ICD-10-CM | POA: Diagnosis present

## 2024-05-27 LAB — COMPREHENSIVE METABOLIC PANEL WITH GFR
ALT: 115 U/L — ABNORMAL HIGH (ref 0–44)
AST: 134 U/L — ABNORMAL HIGH (ref 15–41)
Albumin: 4 g/dL (ref 3.5–5.0)
Alkaline Phosphatase: 76 U/L (ref 38–126)
Anion gap: 17 — ABNORMAL HIGH (ref 5–15)
BUN: 12 mg/dL (ref 6–20)
CO2: 21 mmol/L — ABNORMAL LOW (ref 22–32)
Calcium: 9 mg/dL (ref 8.9–10.3)
Chloride: 99 mmol/L (ref 98–111)
Creatinine, Ser: 0.75 mg/dL (ref 0.61–1.24)
GFR, Estimated: >60 mL/min (ref >60)
Glucose, Bld: 116 mg/dL — ABNORMAL HIGH (ref 70–99)
Potassium: 3.7 mmol/L (ref 3.5–5.1)
Sodium: 137 mmol/L (ref 135–145)
Total Bilirubin: 0.5 mg/dL (ref 0.0–1.2)
Total Protein: 6.7 g/dL (ref 6.5–8.1)

## 2024-05-27 LAB — URINALYSIS, ROUTINE W REFLEX MICROSCOPIC
Bilirubin Urine: NEGATIVE
Glucose, UA: NEGATIVE mg/dL
Hgb urine dipstick: NEGATIVE
Ketones, ur: NEGATIVE mg/dL
Leukocytes,Ua: NEGATIVE
Nitrite: NEGATIVE
Protein, ur: NEGATIVE mg/dL
Specific Gravity, Urine: 1.006 (ref 1.005–1.030)
pH: 7 (ref 5.0–8.0)

## 2024-05-27 LAB — CBC
HCT: 48 % (ref 39.0–52.0)
Hemoglobin: 16.4 g/dL (ref 13.0–17.0)
MCH: 29 pg (ref 26.0–34.0)
MCHC: 34.2 g/dL (ref 30.0–36.0)
MCV: 84.8 fL (ref 80.0–100.0)
Platelets: 258 K/uL (ref 150–400)
RBC: 5.66 MIL/uL (ref 4.22–5.81)
RDW: 14.3 % (ref 11.5–15.5)
WBC: 6.7 K/uL (ref 4.0–10.5)
nRBC: 0 % (ref 0.0–0.2)

## 2024-05-27 LAB — RAPID URINE DRUG SCREEN, HOSP PERFORMED
Amphetamines: NOT DETECTED
Barbiturates: NOT DETECTED
Benzodiazepines: POSITIVE — AB
Cocaine: NOT DETECTED
Opiates: NOT DETECTED
Tetrahydrocannabinol: NOT DETECTED

## 2024-05-27 LAB — LIPASE, BLOOD: Lipase: 25 U/L (ref 11–51)

## 2024-05-27 LAB — ETHANOL: Alcohol, Ethyl (B): 145 mg/dL — ABNORMAL HIGH (ref <15)

## 2024-05-27 NOTE — ED Triage Notes (Signed)
 The pt wants to be detoxed from alcohol  his last alcohol was 5 hours ago

## 2024-05-28 DIAGNOSIS — E782 Mixed hyperlipidemia: Secondary | ICD-10-CM | POA: Diagnosis present

## 2024-05-28 DIAGNOSIS — G4700 Insomnia, unspecified: Secondary | ICD-10-CM | POA: Diagnosis present

## 2024-05-28 DIAGNOSIS — F10239 Alcohol dependence with withdrawal, unspecified: Secondary | ICD-10-CM | POA: Diagnosis present

## 2024-05-28 DIAGNOSIS — F10939 Alcohol use, unspecified with withdrawal, unspecified: Secondary | ICD-10-CM

## 2024-05-28 DIAGNOSIS — E876 Hypokalemia: Secondary | ICD-10-CM | POA: Diagnosis present

## 2024-05-28 DIAGNOSIS — F411 Generalized anxiety disorder: Secondary | ICD-10-CM | POA: Diagnosis present

## 2024-05-28 DIAGNOSIS — Z79890 Hormone replacement therapy (postmenopausal): Secondary | ICD-10-CM | POA: Diagnosis not present

## 2024-05-28 DIAGNOSIS — M109 Gout, unspecified: Secondary | ICD-10-CM | POA: Diagnosis present

## 2024-05-28 DIAGNOSIS — Z79899 Other long term (current) drug therapy: Secondary | ICD-10-CM | POA: Diagnosis not present

## 2024-05-28 DIAGNOSIS — Z6841 Body Mass Index (BMI) 40.0 and over, adult: Secondary | ICD-10-CM | POA: Diagnosis not present

## 2024-05-28 DIAGNOSIS — Z888 Allergy status to other drugs, medicaments and biological substances status: Secondary | ICD-10-CM | POA: Diagnosis not present

## 2024-05-28 DIAGNOSIS — F32A Depression, unspecified: Secondary | ICD-10-CM | POA: Diagnosis present

## 2024-05-28 DIAGNOSIS — Z8249 Family history of ischemic heart disease and other diseases of the circulatory system: Secondary | ICD-10-CM | POA: Diagnosis not present

## 2024-05-28 DIAGNOSIS — Z7901 Long term (current) use of anticoagulants: Secondary | ICD-10-CM | POA: Diagnosis not present

## 2024-05-28 DIAGNOSIS — Z86711 Personal history of pulmonary embolism: Secondary | ICD-10-CM | POA: Diagnosis not present

## 2024-05-28 DIAGNOSIS — E039 Hypothyroidism, unspecified: Secondary | ICD-10-CM | POA: Diagnosis present

## 2024-05-28 DIAGNOSIS — Y906 Blood alcohol level of 120-199 mg/100 ml: Secondary | ICD-10-CM | POA: Diagnosis present

## 2024-05-28 DIAGNOSIS — I10 Essential (primary) hypertension: Secondary | ICD-10-CM

## 2024-05-28 DIAGNOSIS — E66813 Obesity, class 3: Secondary | ICD-10-CM | POA: Diagnosis present

## 2024-05-28 LAB — COMPREHENSIVE METABOLIC PANEL WITH GFR
ALT: 98 U/L — ABNORMAL HIGH (ref 0–44)
AST: 100 U/L — ABNORMAL HIGH (ref 15–41)
Albumin: 3.9 g/dL (ref 3.5–5.0)
Alkaline Phosphatase: 68 U/L (ref 38–126)
Anion gap: 15 (ref 5–15)
BUN: 12 mg/dL (ref 6–20)
CO2: 24 mmol/L (ref 22–32)
Calcium: 8.6 mg/dL — ABNORMAL LOW (ref 8.9–10.3)
Chloride: 99 mmol/L (ref 98–111)
Creatinine, Ser: 1.1 mg/dL (ref 0.61–1.24)
GFR, Estimated: >60 mL/min (ref >60)
Glucose, Bld: 103 mg/dL — ABNORMAL HIGH (ref 70–99)
Potassium: 4.2 mmol/L (ref 3.5–5.1)
Sodium: 138 mmol/L (ref 135–145)
Total Bilirubin: 0.8 mg/dL (ref 0.0–1.2)
Total Protein: 6.3 g/dL — ABNORMAL LOW (ref 6.5–8.1)

## 2024-05-28 LAB — PHOSPHORUS: Phosphorus: 7.8 mg/dL — ABNORMAL HIGH (ref 2.5–4.6)

## 2024-05-28 LAB — MAGNESIUM: Magnesium: 1.8 mg/dL (ref 1.7–2.4)

## 2024-05-28 MED ORDER — PHENOBARBITAL 32.4 MG PO TABS
64.8000 mg | ORAL_TABLET | Freq: Three times a day (TID) | ORAL | Status: DC
  Administered 2024-05-30: 64.8 mg via ORAL
  Filled 2024-05-28: qty 2

## 2024-05-28 MED ORDER — THIAMINE MONONITRATE 100 MG PO TABS
100.0000 mg | ORAL_TABLET | Freq: Every day | ORAL | Status: DC
Start: 2024-05-28 — End: 2024-05-30
  Administered 2024-05-28 – 2024-05-30 (×3): 100 mg via ORAL
  Filled 2024-05-28 (×3): qty 1

## 2024-05-28 MED ORDER — FOLIC ACID 1 MG PO TABS
1.0000 mg | ORAL_TABLET | Freq: Every day | ORAL | Status: DC
  Administered 2024-05-28 – 2024-05-30 (×3): 1 mg via ORAL
  Filled 2024-05-28 (×3): qty 1

## 2024-05-28 MED ORDER — THIAMINE MONONITRATE 100 MG PO TABS: 100.0000 mg | ORAL_TABLET | Freq: Every day | ORAL | Status: DC

## 2024-05-28 MED ORDER — LEVOTHYROXINE SODIUM 75 MCG PO TABS
75.0000 ug | ORAL_TABLET | Freq: Every day | ORAL | Status: DC
  Administered 2024-05-28 – 2024-05-30 (×3): 75 ug via ORAL
  Filled 2024-05-28 (×3): qty 1

## 2024-05-28 MED ORDER — ONDANSETRON HCL 4 MG/2ML IJ SOLN
4.0000 mg | Freq: Once | INTRAMUSCULAR | Status: AC
  Administered 2024-05-28: 4 mg via INTRAVENOUS
  Filled 2024-05-28: qty 2

## 2024-05-28 MED ORDER — LABETALOL HCL 5 MG/ML IV SOLN
5.0000 mg | INTRAVENOUS | Status: DC | PRN
  Administered 2024-05-28 – 2024-05-29 (×2): 5 mg via INTRAVENOUS
  Filled 2024-05-28 (×2): qty 4

## 2024-05-28 MED ORDER — SODIUM CHLORIDE 0.9 % IV SOLN: 260.0000 mg | Freq: Once | INTRAVENOUS | Status: DC

## 2024-05-28 MED ORDER — PHENOBARBITAL 32.4 MG PO TABS: 32.4000 mg | ORAL_TABLET | Freq: Three times a day (TID) | ORAL | Status: DC

## 2024-05-28 MED ORDER — PHENOBARBITAL SODIUM 130 MG/ML IJ SOLN
130.0000 mg | Freq: Once | INTRAMUSCULAR | Status: AC
  Administered 2024-05-28: 130 mg via INTRAVENOUS
  Filled 2024-05-28: qty 1

## 2024-05-28 MED ORDER — THIAMINE HCL 100 MG/ML IJ SOLN
100.0000 mg | Freq: Every day | INTRAMUSCULAR | Status: DC
  Filled 2024-05-28: qty 2

## 2024-05-28 MED ORDER — LACTATED RINGERS IV BOLUS
1000.0000 mL | Freq: Once | INTRAVENOUS | Status: AC
  Administered 2024-05-28: 1000 mL via INTRAVENOUS

## 2024-05-28 MED ORDER — PHENOBARBITAL 32.4 MG PO TABS
97.2000 mg | ORAL_TABLET | Freq: Three times a day (TID) | ORAL | Status: AC
  Administered 2024-05-28 – 2024-05-30 (×5): 97.2 mg via ORAL
  Filled 2024-05-28 (×5): qty 3

## 2024-05-28 MED ORDER — ENOXAPARIN SODIUM 120 MG/0.8ML IJ SOSY
120.0000 mg | PREFILLED_SYRINGE | Freq: Two times a day (BID) | INTRAMUSCULAR | Status: DC
  Administered 2024-05-28 – 2024-05-30 (×5): 120 mg via SUBCUTANEOUS
  Filled 2024-05-28 (×5): qty 0.8

## 2024-05-28 MED ORDER — LORAZEPAM 2 MG/ML IJ SOLN
1.0000 mg | INTRAMUSCULAR | Status: DC | PRN
  Administered 2024-05-28 – 2024-05-30 (×11): 1 mg via INTRAVENOUS
  Filled 2024-05-28 (×12): qty 1

## 2024-05-28 MED ORDER — ALLOPURINOL 100 MG PO TABS
300.0000 mg | ORAL_TABLET | Freq: Every day | ORAL | Status: DC
  Administered 2024-05-28 – 2024-05-30 (×3): 300 mg via ORAL
  Filled 2024-05-28 (×3): qty 3

## 2024-05-28 MED ORDER — ADULT MULTIVITAMIN W/MINERALS CH
1.0000 | ORAL_TABLET | Freq: Every day | ORAL | Status: DC
  Administered 2024-05-28 – 2024-05-30 (×3): 1 via ORAL
  Filled 2024-05-28 (×3): qty 1

## 2024-05-28 MED ORDER — SODIUM CHLORIDE 0.9 % IV SOLN
260.0000 mg | Freq: Once | INTRAVENOUS | Status: AC
  Administered 2024-05-28: 260 mg via INTRAVENOUS
  Filled 2024-05-28: qty 2

## 2024-05-28 NOTE — H&P (Signed)
 History and Physical    Juan Clarke FMW:[id number] DOB: 1981/01/15 DOA: 05/27/2024  PCP: Cleotilde Planas, MD  Patient coming from: Home  Chief Complaint: Requesting alcohol detox  HPI: Juan Clarke is a 43 y.o. male with medical history significant of chronic heavy alcohol use with history of severe withdrawal/DTs requiring intubation, history of serotonin syndrome, anxiety, depression, class III obesity (BMI 43.85), hypothyroidism, hypertension, hyperlipidemia, history of PE in June 2025 on Eliquis .  Recent hospital admission 9/8-9/05/2024 for alcohol detox managed with phenobarbital  taper and symptom triggered lorazepam .  He was discharged on Librium  taper.  Patient presents to the ED requesting detox from alcohol. States he was recently admitted to the hospital and discharged on Librium  taper but he started drinking again.  Usually drinks 8 24 ounce 8% malt beverages.  Yesterday he started to cut down on his drinking.  Last drink was at 7:30 PM.  He is endorsing severe anxiety, nausea, vomiting, and loose stools.  Denies abdominal pain.  Denies fevers, cough, shortness of breath, or chest pain.  ED Course: Ethanol level 145 and patient noted to be undergoing alcohol withdrawal with CIWA score 14 initially.  Labs notable for elevated transaminases (AST 134 and ALT 115), alk phos and T. bili normal.  Transaminases elevated on previous labs as well.  UDS positive for benzodiazepines.  Patient was given phenobarbital  x 2, Zofran , and 1 L LR.  Review of Systems:  Review of Systems  All other systems reviewed and are negative.   Past Medical History:  Diagnosis Date   GAD (generalized anxiety disorder)    HTN (hypertension)    Insomnia    Migraine without aura    Overweight    Trigeminal neuralgia     Past Surgical History:  Procedure Laterality Date   Face surgery  11/14/14   TONSILLECTOMY     43 yr old     reports that he has never smoked. He has never used smokeless tobacco.  He reports current alcohol use of about 42.0 standard drinks of alcohol per week. He reports current drug use. Frequency: 7.00 times per week. Drug: Marijuana.  Allergies  Allergen Reactions   Lexapro [Escitalopram Oxalate] Other (See Comments)    Lack of therapeutic effect    Zoloft [Sertraline Hcl] Other (See Comments)    Lack of therapeutic effect     Family History  Problem Relation Age of Onset   Migraines Mother    Hypertension Father    Mental illness Sister    Drug abuse Sister    Brain cancer Maternal Grandmother    Heart disease Paternal Grandfather     Prior to Admission medications   Medication Sig Start Date End Date Taking? Authorizing Provider  acamprosate  (CAMPRAL ) 333 MG tablet Take 2 tablets (666 mg total) by mouth 3 (three) times daily. 05/09/24 08/07/24  [place], Eric J, DO  acetaminophen  (TYLENOL ) 325 MG tablet Take 650 mg by mouth daily as needed for mild pain (pain score 1-3), headache or moderate pain (pain score 4-6).    [provider]  allopurinol  (ZYLOPRIM ) 300 MG tablet Take 300 mg by mouth daily.    [provider]  apixaban  (ELIQUIS ) 5 MG TABS tablet Take 1 tablet (5 mg total) by mouth 2 (two) times daily. 03/09/24   Darlean Ozell NOVAK, MD  atorvastatin  (LIPITOR) 20 MG tablet Take 20 mg by mouth daily.    [provider]  chlordiazePOXIDE  (LIBRIUM ) 25 MG capsule 50mg  PO TID x 1D, then 25-50mg   PO BID X 1D, then 25-50mg  PO QD X 1D 05/11/24   Robinson, John K, PA-C  DULoxetine  (CYMBALTA ) 60 MG capsule Take 120 mg by mouth daily.    [provider]  levothyroxine  (SYNTHROID ) 75 MCG tablet Take 75 mcg by mouth daily before breakfast. 12/21/23   [provider]  lisinopril  (ZESTRIL ) 10 MG tablet Take 10 mg by mouth daily. 02/09/24   [provider]  lisinopril -hydrochlorothiazide  (ZESTORETIC ) 20-25 MG tablet Take 1 tablet by mouth daily.    [provider]  Melatonin 5 MG TBDP Take 5 mg by mouth at bedtime  as needed (sleep). 02/26/24   [provider]  methocarbamol  (ROBAXIN ) 500 MG tablet Take 500 mg by mouth daily as needed for muscle spasms. 03/08/24   [provider]  propranolol  (INDERAL ) 20 MG tablet Take 20 mg by mouth 3 (three) times daily as needed (Anxiety).    [provider]  traZODone  (DESYREL ) 100 MG tablet Take 100 mg by mouth at bedtime as needed for sleep.    [provider]    Physical Exam: Vitals:   05/28/24 0233 05/28/24 0237 05/28/24 0252 05/28/24 0345  BP: (!) 129/95   (!) 130/99  Pulse: (!) 107  97 (!) 108  Resp:  20  20  Temp:    97.6 F (36.4 C)  TempSrc:    Oral  SpO2:  98% 96% 96%  Weight:      Height:        Physical Exam Vitals reviewed.  Constitutional:      General: He is not in acute distress.    Comments: Appears diaphoretic and anxious  HENT:     Head: Normocephalic and atraumatic.  Eyes:     Extraocular Movements: Extraocular movements intact.  Cardiovascular:     Rate and Rhythm: Normal rate and regular rhythm.     Heart sounds: Normal heart sounds.  Pulmonary:     Effort: Pulmonary effort is normal. No respiratory distress.     Breath sounds: Normal breath sounds.  Abdominal:     General: Bowel sounds are normal.     Palpations: Abdomen is soft.     Tenderness: There is no abdominal tenderness. There is no guarding.  Musculoskeletal:     Cervical back: Normal range of motion.     Right lower leg: No edema.     Left lower leg: No edema.  Skin:    General: Skin is warm and dry.  Neurological:     General: No focal deficit present.     Mental Status: He is alert and oriented to person, place, and time.     Labs on Admission: I have personally reviewed following labs and imaging studies  CBC: Recent Labs  Lab 05/27/24 2126  WBC 6.7  HGB 16.4  HCT 48.0  MCV 84.8  PLT 258   Basic Metabolic Panel: Recent Labs  Lab 05/27/24 2126  NA 137  K 3.7  CL 99  CO2 21*  GLUCOSE 116*  BUN 12   CREATININE 0.75  CALCIUM  9.0   GFR: Estimated Creatinine Clearance: 155.5 mL/min (by C-G formula based on SCr of 0.75 mg/dL). Liver Function Tests: Recent Labs  Lab 05/27/24 2126  AST 134*  ALT 115*  ALKPHOS 76  BILITOT 0.5  PROT 6.7  ALBUMIN 4.0   Recent Labs  Lab 05/27/24 2126  LIPASE 25   No results for input(s): AMMONIA in the last 168 hours. Coagulation Profile: No results for input(s): INR,  PROTIME in the last 168 hours. Cardiac Enzymes: No results for input(s): CKTOTAL, CKMB, CKMBINDEX, TROPONINI in the last 168 hours. BNP (last 3 results) No results for input(s): PROBNP in the last 8760 hours. HbA1C: No results for input(s): HGBA1C in the last 72 hours. CBG: No results for input(s): GLUCAP in the last 168 hours. Lipid Profile: No results for input(s): CHOL, HDL, LDLCALC, TRIG, CHOLHDL, LDLDIRECT in the last 72 hours. Thyroid Function Tests: No results for input(s): TSH, T4TOTAL, FREET4, T3FREE, THYROIDAB in the last 72 hours. Anemia Panel: No results for input(s): VITAMINB12, FOLATE, FERRITIN, TIBC, IRON, RETICCTPCT in the last 72 hours. Urine analysis:    Component Value Date/Time   COLORURINE STRAW (A) 05/27/2024 2120   APPEARANCEUR CLEAR 05/27/2024 2120   LABSPEC 1.006 05/27/2024 2120   PHURINE 7.0 05/27/2024 2120   GLUCOSEU NEGATIVE 05/27/2024 2120   HGBUR NEGATIVE 05/27/2024 2120   BILIRUBINUR NEGATIVE 05/27/2024 2120   KETONESUR NEGATIVE 05/27/2024 2120   PROTEINUR NEGATIVE 05/27/2024 2120   NITRITE NEGATIVE 05/27/2024 2120   LEUKOCYTESUR NEGATIVE 05/27/2024 2120    Radiological Exams on Admission: No results found.  EKG: Independently reviewed.  Sinus rhythm, no significant change compared to previous tracings.  Assessment and Plan  Alcohol withdrawal Alcohol use disorder, severe, dependence -Admit to progressive care unit. -Phenobarbital  taper -Monitor under CIWA protocol with  symptom triggered lorazepam  as needed -Folate, thiamine , multivitamin -TOC consulted. -Monitor electrolytes  Nausea, vomiting, loose stools Symptoms likely related to alcohol withdrawal.  Transaminases elevated in the setting of chronic heavy alcohol use.  No abdominal pain or tenderness.  Not actively vomiting at this time.  History of PE Use Lovenox  while on phenobarbital .  Anxiety/depression Hold duloxetine  due to abnormal LFTs.  Hypothyroidism Continue Synthroid .  Hypertension SBP currently in the 130s.  Holding lisinopril  and HCTZ at this time due to risk of AKI.  IV labetalol  PRN SBP >160.  Hyperlipidemia Hold statin due to abnormal LFTs.  Gout Continue allopurinol .  Code Status: Full Code (discussed with the patient) Level of care: Progressive Care Unit Admission status: It is my clinical opinion that admission to INPATIENT is reasonable and necessary because of the expectation that this patient will require hospital care that crosses at least 2 midnights to treat this condition based on the medical complexity of the problems presented.  Given the aforementioned information, the predictability of an adverse outcome is felt to be significant.  Editha Ram MD [facility]  If 7PM-7AM, please contact night-coverage [email or web address]  05/28/2024, 5:25 AM

## 2024-05-28 NOTE — ED Notes (Signed)
 Pt reports wanting detox from ETOH, last drink approx 1900 on 9/28. H/o Dts in may 2025 but no withdrawal seizures.

## 2024-05-28 NOTE — ED Notes (Signed)
 Pt reports anxiety is through the roof even with the current meds. Pt expresses less nausea, MD aware of anxiety and no new meds are to be ordered. Will get 1hr CIWA after phenobarb finished.

## 2024-05-28 NOTE — Progress Notes (Signed)
 Subjective: Patient admitted this morning, see detailed H&P by Dr Alfornia 43 y.o. male with medical history significant of chronic heavy alcohol use with history of severe withdrawal/DTs requiring intubation, history of serotonin syndrome, anxiety, depression, class III obesity (BMI 43.85), hypothyroidism, hypertension, hyperlipidemia, history of PE in June 2025 on Eliquis .  Recent hospital admission 9/8-9/05/2024 for alcohol detox managed with phenobarbital  taper and symptom triggered lorazepam .  He was discharged on Librium  taper.  Patient presents to the ED requesting detox from alcohol. States he was recently admitted to the hospital and discharged on Librium  taper but he started drinking again.  Usually drinks 8 24 ounce 8% malt beverages.  Yesterday he started to cut down on his drinking.  Last drink was at 7:30 PM.   Vitals:   05/28/24 0252 05/28/24 0345  BP:  (!) 130/99  Pulse: 97 (!) 108  Resp:  20  Temp:  97.6 F (36.4 C)  SpO2: 96% 96%      A/P  Alcohol withdrawal Alcohol use disorder, severe, dependence -Phenobarbital  taper -Monitor under CIWA protocol with symptom triggered lorazepam  as needed -Folate, thiamine , multivitamin -TOC consulted. -Monitor electrolytes   Nausea, vomiting, loose stools Symptoms likely related to alcohol withdrawal.  Transaminases elevated in the setting of chronic heavy alcohol use.  No abdominal pain or tenderness.  Not actively vomiting at this time.   History of PE Use full dose Lovenox  while on phenobarbital .   Anxiety/depression Hold duloxetine  due to abnormal LFTs.   Hypothyroidism Continue Synthroid .   Hypertension SBP currently in the 130s.  Holding lisinopril  and HCTZ at this time due to risk of AKI.  IV labetalol  PRN SBP >160.   Hyperlipidemia Hold statin due to abnormal LFTs.   Gout Continue allopurinol .   Sabas GORMAN Brod [facility]

## 2024-05-28 NOTE — ED Notes (Signed)
 Pt amb to RR independently

## 2024-05-28 NOTE — ED Notes (Signed)
 Pt and all belongings transported upstairs via transport at this time

## 2024-05-28 NOTE — ED Provider Notes (Signed)
 [place] EMERGENCY DEPARTMENT AT [facility] Provider Note   CSN: [id number] Arrival date & time: 05/27/24  2001     History Chief Complaint  Patient presents with   detox alcohol    HPI Juan Clarke is a 43 y.o. male presenting for chief complaint of ETOH withdrawals. Usually drinks 8 24 oz 8% malt beverages. History of intubation due to severe Dts. States that he was recently admitted for alcohol withdrawals, was discharged home on a Librium  taper and states he could not resist continuing to drink despite using the Librium .  Finished a Librium  comes in today for further care and management.  States he tried to start cutting back was down to 4 cans of beer a day but started having uncontrolled anxiety, headaches and sweats today.  Patient's recorded medical, surgical, social, medication list and allergies were reviewed in the Snapshot window as part of the initial history.   Review of Systems   Review of Systems  Constitutional:  Positive for fatigue. Negative for chills and fever.  HENT:  Negative for ear pain and sore throat.   Eyes:  Negative for pain and visual disturbance.  Respiratory:  Negative for cough and shortness of breath.   Cardiovascular:  Negative for chest pain and palpitations.  Gastrointestinal:  Negative for abdominal pain and vomiting.  Genitourinary:  Negative for dysuria and hematuria.  Musculoskeletal:  Negative for arthralgias and back pain.  Skin:  Negative for color change and rash.  Neurological:  Positive for dizziness, tremors and light-headedness. Negative for seizures and syncope.  All other systems reviewed and are negative.   Physical Exam Updated Vital Signs BP (!) 130/99 (BP Location: Right Arm)   Pulse (!) 108   Temp 97.6 F (36.4 C) (Oral)   Resp 20   Ht 5' 7 (1.702 m)   Wt 127 kg   SpO2 96%   BMI 43.85 kg/m  Physical Exam Vitals and nursing note reviewed.  Constitutional:      General: He is not in acute  distress.    Appearance: He is well-developed.  HENT:     Head: Normocephalic and atraumatic.  Eyes:     Conjunctiva/sclera: Conjunctivae normal.  Cardiovascular:     Rate and Rhythm: Normal rate and regular rhythm.     Heart sounds: No murmur heard. Pulmonary:     Effort: Pulmonary effort is normal. No respiratory distress.     Breath sounds: Normal breath sounds.  Abdominal:     Palpations: Abdomen is soft.     Tenderness: There is no abdominal tenderness.  Musculoskeletal:        General: No swelling.     Cervical back: Neck supple.  Skin:    General: Skin is warm and dry.     Capillary Refill: Capillary refill takes less than 2 seconds.  Neurological:     Mental Status: He is alert.  Psychiatric:        Mood and Affect: Mood normal.      ED Course/ Medical Decision Making/ A&P    Procedures .Critical Care  Performed by: Jerral Meth, MD Authorized by: Jerral Meth, MD   Critical care provider statement:    Critical care time (minutes):  30   Critical care was necessary to treat or prevent imminent or life-threatening deterioration of the following conditions:  CNS failure or compromise   Critical care was time spent personally by me on the following activities:  Development of treatment plan with patient or  surrogate, discussions with consultants, evaluation of patient's response to treatment, examination of patient, ordering and review of laboratory studies, ordering and review of radiographic studies, ordering and performing treatments and interventions, pulse oximetry, re-evaluation of patient's condition and review of old charts   Care discussed with: admitting provider      Medications Ordered in ED Medications  thiamine  (VITAMIN B1) tablet 100 mg (has no administration in time range)  PHENObarbital  (LUMINAL) 260 mg in sodium chloride  0.9 % 100 mL IVPB (0 mg Intravenous Stopped 05/28/24 0117)  lactated ringers  bolus 1,000 mL (0 mLs Intravenous Stopped  05/28/24 0236)  ondansetron  (ZOFRAN ) injection 4 mg (4 mg Intravenous Given 05/28/24 0114)  PHENObarbital  (LUMINAL) injection 130 mg (130 mg Intravenous Given 05/28/24 0247)  PHENObarbital  (LUMINAL) injection 130 mg (130 mg Intravenous Given 05/28/24 0509)    Medical Decision Making:   This is a 43 year old male with a history of heavy alcohol use presenting with a chief complaint of withdrawal syndrome.  He has had complicated withdrawals in the past and has been attempting to cut back at home.  No acute distress on my exam.  CIWA 14 initially treated with phenobarbital  due to history of oversedation with benzodiazepines in the past. On reassessment his CIWA has gone on to an 11.  Will redosed with the 130 mg. On reassessment again his CIWA has gone down to approximately an 8.  Will redosed with the additional 130 mg. Patient resting comfortably on serial reassessments, heart rate and blood pressure have improved.  Consulted hospitalist for admission due to history of complex DTs in the past. Lab work reviewed with elevated ethanol level, mild transaminitis. Consulted with medicine for admission.  Dr. Alfornia accepted  Disposition:   Based on the above findings, I believe this patient is stable for admission.    Patient/family educated about specific findings on our evaluation and explained exact reasons for admission.  Patient/family educated about clinical situation and time was allowed to answer questions.   Admission team communicated with and agreed with need for admission. Patient admitted. Patient ready to move at this time.     Emergency Department Medication Summary:   Medications  thiamine  (VITAMIN B1) tablet 100 mg (has no administration in time range)  PHENObarbital  (LUMINAL) 260 mg in sodium chloride  0.9 % 100 mL IVPB (0 mg Intravenous Stopped 05/28/24 0117)  lactated ringers  bolus 1,000 mL (0 mLs Intravenous Stopped 05/28/24 0236)  ondansetron  (ZOFRAN ) injection 4 mg (4 mg  Intravenous Given 05/28/24 0114)  PHENObarbital  (LUMINAL) injection 130 mg (130 mg Intravenous Given 05/28/24 0247)  PHENObarbital  (LUMINAL) injection 130 mg (130 mg Intravenous Given 05/28/24 0509)        Clinical Impression:  1. Alcohol withdrawal syndrome with complication (HCC)      Data Unavailable   Final Clinical Impression(s) / ED Diagnoses Final diagnoses:  Alcohol withdrawal syndrome with complication [facility])    Rx / DC Orders ED Discharge Orders     None         Jerral Meth, MD 05/28/24 [phone]

## 2024-05-28 NOTE — Progress Notes (Addendum)
 PHARMACY - ANTICOAGULATION CONSULT NOTE  Pharmacy Consult for Lovenox  Indication: hx DVT/PE (PTA eliquis )  Allergies  Allergen Reactions   Lexapro [Escitalopram Oxalate] Other (See Comments)    Lack of therapeutic effect    Zoloft [Sertraline Hcl] Other (See Comments)    Lack of therapeutic effect     Patient Measurements: Height: 5' 7 (170.2 cm) Weight: 127 kg (279 lb 15.8 oz) IBW/kg (Calculated) : 66.1 HEPARIN  DW (KG): 95.9  Vital Signs: Temp: 97.6 F (36.4 C) (09/29 0345) Temp Source: Oral (09/29 0345) BP: 130/99 (09/29 0345) Pulse Rate: 108 (09/29 0345)  Labs: Recent Labs    05/27/24 2126  HGB 16.4  HCT 48.0  PLT 258  CREATININE 0.75    Estimated Creatinine Clearance: 155.5 mL/min (by C-G formula based on SCr of 0.75 mg/dL).   Medical History: Past Medical History:  Diagnosis Date   GAD (generalized anxiety disorder)    HTN (hypertension)    Insomnia    Migraine without aura    Overweight    Trigeminal neuralgia    Assessment: 79 yoM presented to ED for alcohol withdrawal detox. Pharmacy consulted to dose lovenox  whi;e receiving phenobarbital  given drug-drug interaction with apixaban  for DVT/PE (01/2024)  -CBC stable -LD of eliquis : 9/25 @1100   Goal of Therapy:  Anti-Xa level 0.6-1 units/ml 4hrs after LMWH dose given Monitor platelets by anticoagulation protocol: Yes   Plan:  Lovenox  1mg /kg [place] every 12 hours CBC daily  F/u transition to eliquis  once completed phenobarbital  taper  Lynwood Poplar, PharmD, BCPS Clinical Pharmacist 05/28/2024 6:23 AM

## 2024-05-29 MED ORDER — LORAZEPAM 2 MG/ML IJ SOLN
1.0000 mg | Freq: Once | INTRAMUSCULAR | Status: AC
  Administered 2024-05-29: 1 mg via INTRAVENOUS

## 2024-05-29 MED ORDER — LISINOPRIL 20 MG PO TABS
20.0000 mg | ORAL_TABLET | Freq: Every day | ORAL | Status: DC
  Administered 2024-05-29 – 2024-05-30 (×2): 20 mg via ORAL
  Filled 2024-05-29 (×2): qty 1

## 2024-05-29 MED ORDER — LORAZEPAM 2 MG/ML IJ SOLN
1.0000 mg | Freq: Once | INTRAMUSCULAR | Status: AC
Start: 2024-05-29 — End: 2024-05-29
  Administered 2024-05-29: 1 mg via INTRAVENOUS
  Filled 2024-05-29: qty 1

## 2024-05-29 MED ORDER — HYDROCHLOROTHIAZIDE 25 MG PO TABS
25.0000 mg | ORAL_TABLET | Freq: Every day | ORAL | Status: DC
  Administered 2024-05-29 – 2024-05-30 (×2): 25 mg via ORAL
  Filled 2024-05-29 (×2): qty 1

## 2024-05-29 MED ORDER — LISINOPRIL-HYDROCHLOROTHIAZIDE 20-25 MG PO TABS
1.0000 | ORAL_TABLET | Freq: Every day | ORAL | Status: DC
Start: 2024-05-29 — End: 2024-05-29

## 2024-05-29 MED ORDER — DULOXETINE HCL 60 MG PO CPEP
120.0000 mg | ORAL_CAPSULE | Freq: Every day | ORAL | Status: DC
  Administered 2024-05-29 – 2024-05-30 (×2): 120 mg via ORAL
  Filled 2024-05-29 (×2): qty 2

## 2024-05-29 NOTE — Plan of Care (Signed)

## 2024-05-29 NOTE — TOC CAGE-AID Note (Signed)
 Transition of Care [facility]) - CAGE-AID Screening   Patient Details  Name: Juan Clarke MRN: [id number] Date of Birth: [birth year]  Transition of Care [facility]) CM/SW Contact:    Andrez JULIANNA George, RN Phone Number: 05/29/2024, 1:17 PM   Clinical Narrative:  Pt is already active with [facility].  CAGE-AID Screening:    Have You Ever Felt You Ought to Cut Down on Your Drinking or Drug Use?: Yes Have People Annoyed You By Critizing Your Drinking Or Drug Use?: Yes Have You Felt Bad Or Guilty About Your Drinking Or Drug Use?: Yes Have You Ever Had a Drink or Used Drugs First Thing In The Morning to Steady Your Nerves or to Get Rid of a Hangover?: Yes CAGE-AID Score: 4  Substance Abuse Education Offered: Yes (pt currently with [facility])

## 2024-05-29 NOTE — Progress Notes (Signed)
 [facility]  PROGRESS NOTE  Juan Clarke FMW:[id number] DOB: November 14, 1980 DOA: 05/27/2024 PCP: Cleotilde Planas, MD   Brief HPI:   43 y.o. male with medical history significant of chronic heavy alcohol use with history of severe withdrawal/DTs requiring intubation, history of serotonin syndrome, anxiety, depression, class III obesity (BMI 43.85), hypothyroidism, hypertension, hyperlipidemia, history of PE in June 2025 on Eliquis .  Recent hospital admission 9/8-9/05/2024 for alcohol detox managed with phenobarbital  taper and symptom triggered lorazepam .  He was discharged on Librium  taper.     Assessment/Plan:   Alcohol withdrawal Alcohol use disorder, severe, dependence -Phenobarbital  taper -Monitor under CIWA protocol with symptom triggered lorazepam  as needed -Folate, thiamine , multivitamin -TOC consulted. -Monitor electrolytes   Transaminitis Transaminases elevated in the setting of chronic heavy alcohol use.   No abdominal pain or tenderness.   Not actively vomiting at this time.   History of PE Use full dose Lovenox  while on phenobarbital . -Phenobarb has significant interaction with Eliquis    Anxiety/depression Will restart Cymbalta    Hypothyroidism Continue Synthroid .   Hypertension SBP  now elevated -Will restart lisinopril /hydrochlorothiazide  - Start hydralazine  as needed   Hyperlipidemia Hold statin due to abnormal LFTs.   Gout Continue allopurinol .    Medications     allopurinol   300 mg Oral Daily   enoxaparin  (LOVENOX ) injection  120 mg Subcutaneous Q12H   folic acid   1 mg Oral Daily   levothyroxine   75 mcg Oral QAC breakfast   multivitamin with minerals  1 tablet Oral Daily   phenobarbital   97.2 mg Oral Q8H   Followed by   [START ON 05/30/2024] phenobarbital   64.8 mg Oral Q8H   Followed by   [START ON 06/01/2024] phenobarbital   32.4 mg Oral Q8H   thiamine   100 mg Oral Daily   Or   thiamine   100 mg Intravenous Daily     Data Reviewed:    CBG:  No results for input(s): GLUCAP in the last 168 hours.  SpO2: 100 %    Vitals:   05/28/24 1633 05/28/24 1729 05/28/24 2238 05/29/24 0432  BP:  (!) 144/100 (!) 152/105 (!) 140/86  Pulse:  (!) 110 (!) 125 (!) 102  Resp:  20 18 16   Temp: 98.7 F (37.1 C) 98.3 F (36.8 C) 98.6 F (37 C) 98.5 F (36.9 C)  TempSrc:  Oral Oral Oral  SpO2:  96% 98% 100%  Weight:      Height:          Data Reviewed:  Basic Metabolic Panel: Recent Labs  Lab 05/27/24 2126 05/28/24 0700  NA 137 138  K 3.7 4.2  CL 99 99  CO2 21* 24  GLUCOSE 116* 103*  BUN 12 12  CREATININE 0.75 1.10  CALCIUM  9.0 8.6*  MG  --  1.8  PHOS  --  7.8*    CBC: Recent Labs  Lab 05/27/24 2126  WBC 6.7  HGB 16.4  HCT 48.0  MCV 84.8  PLT 258    LFT Recent Labs  Lab 05/27/24 2126 05/28/24 0700  AST 134* 100*  ALT 115* 98*  ALKPHOS 76 68  BILITOT 0.5 0.8  PROT 6.7 6.3*  ALBUMIN 4.0 3.9     Antibiotics: Anti-infectives (From admission, onward)    None        DVT prophylaxis: Lovenox   Code Status: Full code  Family Communication: No family at bedside   CONSULTS    Subjective   Blood pressure has been elevated.  Denies any other symptoms.  Objective    Physical Examination:  General-appears in no acute distress Heart-S1-S2, regular, no murmur auscultated Lungs-clear to auscultation bilaterally, no wheezing or crackles auscultated Abdomen-soft, nontender, no organomegaly Extremities-no edema in the lower extremities Neuro-alert, oriented x3, no focal deficit noted  Status is: Inpatient:             Sabas GORMAN Brod   [facility] If 7PM-7AM, please contact night-coverage at [email or web address], Office  [phone]   05/29/2024, 8:06 AM  LOS: 1 day

## 2024-05-29 NOTE — TOC Initial Note (Signed)
 Transition of Care [facility]) - Initial/Assessment Note    Patient Details  Name: Juan Clarke MRN: [id number] Date of Birth: 1981/03/27  Transition of Care [facility]) CM/SW Contact:    Andrez JULIANNA George, RN Phone Number: 05/29/2024, 1:15 PM  Clinical Narrative:                  Juan Clarke is a 43 y.o. male with medical history significant of chronic heavy alcohol use with history of severe withdrawal/DTs requiring intubation, history of serotonin syndrome, anxiety, depression, class III obesity (BMI 43.85), hypothyroidism, hypertension, hyperlipidemia, history of PE.  Pt is from home. He has been attending [facility] IOP but was also continuing to drink. He plans to continue with [facility] after d/c.  Pt drives self.  He manages his own medications and denies any issues.  Pt has PCP.  Ip care management following.  Expected Discharge Plan: Home/Self Care     Patient Goals and CMS Choice            Expected Discharge Plan and Services   Discharge Planning Services: CM Consult   Living arrangements for the past 2 months: Single Family Home                                      Prior Living Arrangements/Services Living arrangements for the past 2 months: Single Family Home Lives with:: Self Patient language and need for interpreter reviewed:: Yes Do you feel safe going back to the place where you live?: Yes            Criminal Activity/Legal Involvement Pertinent to Current Situation/Hospitalization: No - Comment as needed  Activities of Daily Living      Permission Sought/Granted                  Emotional Assessment Appearance:: Appears stated age Attitude/Demeanor/Rapport: Engaged Affect (typically observed): Accepting Orientation: : Oriented to Self, Oriented to Place, Oriented to  Time, Oriented to Situation Alcohol / Substance Use: Alcohol Use Psych Involvement: No (comment)  Admission diagnosis:  Alcohol withdrawal (HCC) [F10.939] Alcohol  withdrawal syndrome with complication (HCC) [F10.939] Patient Active Problem List   Diagnosis Date Noted   Alcohol withdrawal (HCC) 05/07/2024   History of DVT (deep vein thrombosis) 05/07/2024   Hypophosphatemia 05/07/2024   Hyperglycemia 05/07/2024   Severe major depression (HCC) 05/07/2024   Transaminitis 05/07/2024   Hypermagnesemia 05/07/2024   CAP (community acquired pneumonia) 02/08/2024   HCAP (healthcare-associated pneumonia) 02/07/2024   Severe sepsis (HCC) 02/07/2024   Hyponatremia 02/07/2024   Pulmonary embolism (HCC) 01/31/2024   Prolonged QT interval 01/31/2024   Acquired hypothyroidism 01/31/2024   Alcoholism (HCC) 01/31/2024   Elevated liver enzymes 01/31/2024   Essential hypertension 01/31/2024   Mixed hyperlipidemia 01/31/2024   Class 3 obesity 01/31/2024   Acute bilateral deep vein thrombosis (DVT) of popliteal veins (HCC) 01/31/2024   Class 2 obesity 01/31/2024   Serotonin syndrome 01/24/2024   Alcohol withdrawal delirium (HCC) 01/18/2024   Hypokalemia 08/15/2023   Acute liver failure without hepatic coma 08/11/2023   Alcohol withdrawal syndrome, with delirium (HCC) 08/11/2023   Acute hepatic encephalopathy (HCC) 08/11/2023   Altered mental status 08/10/2023   Toxic metabolic encephalopathy 08/10/2023   Acute metabolic encephalopathy 08/10/2023   ETOH abuse 11/28/2022   Neck pain 02/25/2015   Headache 02/25/2015   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 [place] [address] Phone: [phone] Fax: [phone]     Social Drivers of Health (SDOH) Social History: SDOH Screenings   Food Insecurity: Food Insecurity Present (05/07/2024)  Housing:  High Risk (05/07/2024)  Transportation Needs: No Transportation Needs (05/07/2024)  Utilities: Not At Risk (05/29/2024)  Social Connections: Socially Isolated (05/07/2024)  Tobacco Use: Low Risk  (05/27/2024)   SDOH Interventions:     Readmission Risk Interventions    05/09/2024   12:49 PM 02/01/2024    1:16 PM 01/20/2024   12:44 PM  Readmission Risk Prevention Plan  Transportation Screening Complete Complete Complete  PCP or Specialist Appt within 3-5 Days   Complete  HRI or Home Care Consult   Complete  Social Work Consult for Recovery Care Planning/Counseling   Complete  Medication Review Oceanographer) Complete Complete Complete  PCP or Specialist appointment within 3-5 days of discharge Complete Complete   HRI or Home Care Consult Complete Complete   SW Recovery Care/Counseling Consult Complete Complete   Palliative Care Screening Not Applicable Not Applicable   Skilled Nursing Facility Not Applicable Complete

## 2024-05-30 ENCOUNTER — Other Ambulatory Visit (HOSPITAL_COMMUNITY): Payer: Self-pay

## 2024-05-30 LAB — COMPREHENSIVE METABOLIC PANEL WITH GFR
ALT: 57 U/L — ABNORMAL HIGH (ref 0–44)
AST: 38 U/L (ref 15–41)
Albumin: 4 g/dL (ref 3.5–5.0)
Alkaline Phosphatase: 90 U/L (ref 38–126)
Anion gap: 11 (ref 5–15)
BUN: 11 mg/dL (ref 6–20)
CO2: 26 mmol/L (ref 22–32)
Calcium: 8.8 mg/dL — ABNORMAL LOW (ref 8.9–10.3)
Chloride: 95 mmol/L — ABNORMAL LOW (ref 98–111)
Creatinine, Ser: 0.93 mg/dL (ref 0.61–1.24)
GFR, Estimated: >60 mL/min (ref >60)
Glucose, Bld: 106 mg/dL — ABNORMAL HIGH (ref 70–99)
Potassium: 3.3 mmol/L — ABNORMAL LOW (ref 3.5–5.1)
Sodium: 132 mmol/L — ABNORMAL LOW (ref 135–145)
Total Bilirubin: 0.6 mg/dL (ref 0.0–1.2)
Total Protein: 6.6 g/dL (ref 6.5–8.1)

## 2024-05-30 MED ORDER — ENOXAPARIN SODIUM 120 MG/0.8ML IJ SOSY
120.0000 mg | PREFILLED_SYRINGE | Freq: Two times a day (BID) | INTRAMUSCULAR | Status: DC
  Filled 2024-05-30: qty 0.8

## 2024-05-30 MED ORDER — ENOXAPARIN SODIUM 120 MG/0.8ML IJ SOSY
120.0000 mg | PREFILLED_SYRINGE | Freq: Once | INTRAMUSCULAR | Status: AC
  Filled 2024-05-30: qty 0.8

## 2024-05-30 MED ORDER — LORAZEPAM 2 MG/ML IJ SOLN
2.0000 mg | Freq: Once | INTRAMUSCULAR | Status: AC
  Administered 2024-05-30: 2 mg via INTRAVENOUS
  Filled 2024-05-30: qty 1

## 2024-05-30 MED ORDER — CHLORDIAZEPOXIDE HCL 25 MG PO CAPS
ORAL_CAPSULE | ORAL | 0 refills | Status: AC
  Filled 2024-05-30: qty 10, 4d supply, fill #0

## 2024-05-30 NOTE — Progress Notes (Signed)
 DISCHARGE NOTE HOME Boysie Bonebrake Kloc to be discharged Home per MD order. Discussed prescriptions and follow up appointments with the patient. Prescriptions given to patient; medication list explained in detail. Patient verbalized understanding.  Skin clean, dry and intact without evidence of skin break down, no evidence of skin tears noted. IV catheter discontinued intact. Site without signs and symptoms of complications. Dressing and pressure applied. Pt denies pain at the site currently. No complaints noted.  Patient free of lines, drains, and wounds.   An After Visit Summary (AVS) was printed and given to the patient. Patient escorted via wheelchair, and discharged home via private auto.  Peyton SHAUNNA Pepper, RN

## 2024-05-30 NOTE — TOC Transition Note (Signed)
 Transition of Care [facility]) - Discharge Note   Patient Details  Name: Juan Clarke MRN: [id number] Date of Birth: 10/01/1980  Transition [facility] Care [facility]) CM/SW Contact:  Andrez JULIANNA George, RN Phone Number: 05/30/2024, 2:38 PM   Clinical Narrative:     Pt is discharging home with self care. Pt to continue with his IOP through [facility] for alcohol counseling.  Pt has transportation home.  Final next level of care: Home/Self Care Barriers to Discharge: No Barriers Identified   Patient Goals and CMS Choice            Discharge Placement                       Discharge Plan and Services Additional resources added to the After Visit Summary for     Discharge Planning Services: CM Consult                                 Social Drivers of Health (SDOH) Interventions SDOH Screenings   Food Insecurity: No Food Insecurity (05/30/2024)  Recent Concern: Food Insecurity - Food Insecurity Present (05/07/2024)  Housing: High Risk (05/30/2024)  Transportation Needs: No Transportation Needs (05/30/2024)  Utilities: Not At Risk (05/29/2024)  Social Connections: Socially Isolated (05/07/2024)  Tobacco Use: Low Risk  (05/27/2024)     Readmission Risk Interventions    05/09/2024   12:49 PM 02/01/2024    1:16 PM 01/20/2024   12:44 PM  Readmission Risk Prevention Plan  Transportation Screening Complete Complete Complete  PCP or Specialist Appt within 3-5 Days   Complete  HRI or Home Care Consult   Complete  Social Work Consult for Recovery Care Planning/Counseling   Complete  Medication Review Oceanographer) Complete Complete Complete  PCP or Specialist appointment within 3-5 days of discharge Complete Complete   HRI or Home Care Consult Complete Complete   SW Recovery Care/Counseling Consult Complete Complete   Palliative Care Screening Not Applicable Not Applicable   Skilled Nursing Facility Not Applicable Complete

## 2024-05-30 NOTE — Discharge Summary (Signed)
 Physician Discharge Summary  Juan Clarke FMW:[id number] DOB: February 22, 1981 DOA: 05/27/2024  PCP: Cleotilde Planas, MD  Admit date: 05/27/2024 Discharge date: 05/30/2024  Admitted From: Home Disposition:  Home  Recommendations for Outpatient Follow-up:  Follow up with PCP   Discharge Condition: Stable CODE STATUS: Full  Diet recommendation: Regular   Brief/Interim Summary: Juan Clarke is a 43 y.o. male with medical history significant of chronic heavy alcohol use with history of severe withdrawal/DTs requiring intubation, history of serotonin syndrome, anxiety, depression, class III obesity (BMI 43.85), hypothyroidism, hypertension, hyperlipidemia, history of PE in June 2025 on Eliquis .  Recent hospital admission 9/8-9/05/2024 for alcohol detox managed with phenobarbital  taper and symptom triggered lorazepam .  He was discharged on Librium  taper.   Patient was again admitted for alcohol withdrawal.  He was started on phenobarbital  taper as well as CIWA protocol with as needed Ativan .  Patient voiced desire to discharge today.  He had no new physical complaints or concerns.  He will be discharged on Librium  taper.  Discharge Diagnoses:   Principal Problem:   Alcohol withdrawal (HCC) Active Problems:   Generalized anxiety disorder   Hypokalemia   Pulmonary embolism (HCC)   Acquired hypothyroidism   Alcoholism (HCC)   Essential hypertension   Mixed hyperlipidemia   Transaminitis   Discharge Instructions  Discharge Instructions     Call MD for:  difficulty breathing, headache or visual disturbances   Complete by: As directed    Call MD for:  extreme fatigue   Complete by: As directed    Call MD for:  persistant dizziness or light-headedness   Complete by: As directed    Call MD for:  persistant nausea and vomiting   Complete by: As directed    Call MD for:  severe uncontrolled pain   Complete by: As directed    Call MD for:  temperature >100.4   Complete by: As directed     Diet - low sodium heart healthy   Complete by: As directed    Discharge instructions   Complete by: As directed    You were cared for by a hospitalist during your hospital stay. If you have any questions about your discharge medications or the care you received while you were in the hospital after you are discharged, you can call the unit and ask to speak with the hospitalist on call if the hospitalist that took care of you is not available. Once you are discharged, your primary care physician will handle any further medical issues. Please note that NO REFILLS for any discharge medications will be authorized once you are discharged, as it is imperative that you return to your primary care physician (or establish a relationship with a primary care physician if you do not have one) for your aftercare needs so that they can reassess your need for medications and monitor your lab values.   Increase activity slowly   Complete by: As directed       Allergies as of 05/30/2024       Reactions   Lexapro [escitalopram Oxalate] Other (See Comments)   Lack of therapeutic effect    Zoloft [sertraline Hcl] Other (See Comments)   Lack of therapeutic effect         Medication List     TAKE these medications    acamprosate  333 MG tablet Commonly known as: CAMPRAL  Take 2 tablets (666 mg total) by mouth 3 (three) times daily.   acetaminophen  325 MG tablet Commonly known as: TYLENOL   Take 650 mg by mouth daily as needed for mild pain (pain score 1-3), headache or moderate pain (pain score 4-6).   allopurinol  300 MG tablet Commonly known as: ZYLOPRIM  Take 300 mg by mouth daily.   apixaban  5 MG Tabs tablet Commonly known as: Eliquis  Take 1 tablet (5 mg total) by mouth 2 (two) times daily.   atorvastatin  20 MG tablet Commonly known as: LIPITOR Take 20 mg by mouth daily.   chlordiazePOXIDE  25 MG capsule Commonly known as: LIBRIUM  Take 1 capsule (25 mg total) by mouth 4 (four) times daily for  1 day, THEN 1 capsule (25 mg total) 3 (three) times daily for 1 day, THEN 1 capsule (25 mg total) in the morning and at bedtime for 1 day, THEN 1 capsule (25 mg total) daily in the afternoon for 1 day. Start taking on: May 30, 2024   DULoxetine  60 MG capsule Commonly known as: CYMBALTA  Take 120 mg by mouth daily.   famotidine -calcium  carbonate-magnesium  hydroxide 10-800-165 MG chewable tablet Commonly known as: PEPCID  COMPLETE Chew 1 tablet by mouth daily as needed (for acid reflux).   levothyroxine  75 MCG tablet Commonly known as: SYNTHROID  Take 75 mcg by mouth daily before breakfast.   lisinopril -hydrochlorothiazide  20-25 MG tablet Commonly known as: ZESTORETIC  Take 1 tablet by mouth daily.   Melatonin 5 MG Tbdp Take 5 mg by mouth at bedtime as needed (sleep).   methocarbamol  500 MG tablet Commonly known as: ROBAXIN  Take 500 mg by mouth daily as needed for muscle spasms.   propranolol  20 MG tablet Commonly known as: INDERAL  Take 20 mg by mouth 3 (three) times daily as needed (Anxiety).   traZODone  100 MG tablet Commonly known as: DESYREL  Take 100 mg by mouth at bedtime as needed for sleep.        Allergies  Allergen Reactions   Lexapro [Escitalopram Oxalate] Other (See Comments)    Lack of therapeutic effect    Zoloft [Sertraline Hcl] Other (See Comments)    Lack of therapeutic effect       Procedures/Studies: DG Chest 2 View Result Date: 05/11/2024 EXAM: 2 VIEW(S) XRAY OF THE CHEST 05/11/2024 06:03:42 AM COMPARISON: 05/07/2024 CLINICAL HISTORY: Chest pain. Upper left chest pain off and on for past few days. FINDINGS: LUNGS AND PLEURA: No focal pulmonary opacity. No pulmonary edema. No pleural effusion. No pneumothorax. HEART AND MEDIASTINUM: No acute abnormality of the cardiac and mediastinal silhouettes. BONES AND SOFT TISSUES: No acute osseous abnormality. IMPRESSION: 1. No acute process. Electronically signed by: Waddell Calk MD 05/11/2024 06:29 AM EDT RP  Workstation: [id number]   DG Chest Port 1 View Result Date: 05/07/2024 EXAM: 1 VIEW XRAY OF THE CHEST 05/07/2024 07:17:00 AM COMPARISON: 02/08/2024 CLINICAL HISTORY: CP. Per chart Pt reports that he wants detox from alcohol. FINDINGS: LUNGS AND PLEURA: Interval resolution of right lower lung airspace opacity. No pulmonary edema. No pleural effusion. No pneumothorax. HEART AND MEDIASTINUM: No acute abnormality of the cardiac and mediastinal silhouettes. BONES AND SOFT TISSUES: No acute osseous abnormality. IMPRESSION: 1. Interval resolution of right lower lung airspace opacity. 2. No acute findings. Electronically signed by: Waddell Calk MD 05/07/2024 07:24 AM EDT RP Workstation: [id number]       Discharge Exam: Vitals:   05/30/24 1016 05/30/24 1155  BP: 127/62 107/65  Pulse: 94 91  Resp:  18  Temp:  98.5 F (36.9 C)  SpO2:  96%    General: Pt is alert, awake, not in acute distress Cardiovascular: RRR, S1/S2 +, no edema  Respiratory: CTA bilaterally, no wheezing, no rhonchi, no respiratory distress, no conversational dyspnea  Abdominal: Soft, NT, ND, bowel sounds + Extremities: no edema, no cyanosis Psych: Normal mood and affect, stable judgement and insight     The results of significant diagnostics from this hospitalization (including imaging, microbiology, ancillary and laboratory) are listed below for reference.     Microbiology: No results found for this or any previous visit (from the past 240 hours).   Labs: BNP (last 3 results) Recent Labs    02/07/24 1933  BNP 8.9   Basic Metabolic Panel: Recent Labs  Lab 05/27/24 2126 05/28/24 0700 05/30/24 0823  NA 137 138 132*  K 3.7 4.2 3.3*  CL 99 99 95*  CO2 21* 24 26  GLUCOSE 116* 103* 106*  BUN 12 12 11   CREATININE 0.75 1.10 0.93  CALCIUM  9.0 8.6* 8.8*  MG  --  1.8  --   PHOS  --  7.8*  --    Liver Function Tests: Recent Labs  Lab 05/27/24 2126 05/28/24 0700 05/30/24 0823  AST 134* 100* 38  ALT 115* 98*  57*  ALKPHOS 76 68 90  BILITOT 0.5 0.8 0.6  PROT 6.7 6.3* 6.6  ALBUMIN 4.0 3.9 4.0   Recent Labs  Lab 05/27/24 2126  LIPASE 25   No results for input(s): AMMONIA in the last 168 hours. CBC: Recent Labs  Lab 05/27/24 2126  WBC 6.7  HGB 16.4  HCT 48.0  MCV 84.8  PLT 258   Cardiac Enzymes: No results for input(s): CKTOTAL, CKMB, CKMBINDEX, TROPONINI in the last 168 hours. BNP: Invalid input(s): POCBNP CBG: No results for input(s): GLUCAP in the last 168 hours. D-Dimer No results for input(s): DDIMER in the last 72 hours. Hgb A1c No results for input(s): HGBA1C in the last 72 hours. Lipid Profile No results for input(s): CHOL, HDL, LDLCALC, TRIG, CHOLHDL, LDLDIRECT in the last 72 hours. Thyroid function studies No results for input(s): TSH, T4TOTAL, T3FREE, THYROIDAB in the last 72 hours.  Invalid input(s): FREET3 Anemia work up No results for input(s): VITAMINB12, FOLATE, FERRITIN, TIBC, IRON, RETICCTPCT in the last 72 hours. Urinalysis    Component Value Date/Time   COLORURINE STRAW (A) 05/27/2024 2120   APPEARANCEUR CLEAR 05/27/2024 2120   LABSPEC 1.006 05/27/2024 2120   PHURINE 7.0 05/27/2024 2120   GLUCOSEU NEGATIVE 05/27/2024 2120   HGBUR NEGATIVE 05/27/2024 2120   BILIRUBINUR NEGATIVE 05/27/2024 2120   KETONESUR NEGATIVE 05/27/2024 2120   PROTEINUR NEGATIVE 05/27/2024 2120   NITRITE NEGATIVE 05/27/2024 2120   LEUKOCYTESUR NEGATIVE 05/27/2024 2120   Sepsis Labs Recent Labs  Lab 05/27/24 2126  WBC 6.7   Microbiology No results found for this or any previous visit (from the past 240 hours).   Patient was seen and examined on the day of discharge and was found to be in stable condition. Time coordinating discharge: 35 minutes including assessment and coordination of care, as well as examination of the patient.   SIGNED:  Delon Hoe, DO [facility] 05/30/2024, 2:31 PM

## 2024-05-30 NOTE — Plan of Care (Signed)

## 2024-06-07 ENCOUNTER — Other Ambulatory Visit: Payer: Self-pay

## 2024-06-07 ENCOUNTER — Encounter (HOSPITAL_COMMUNITY): Payer: Self-pay

## 2024-06-07 ENCOUNTER — Inpatient Hospital Stay (HOSPITAL_COMMUNITY)
Admission: EM | Admit: 2024-06-07 | Discharge: 2024-06-10 | DRG: 897 | Disposition: A | Discharge status: Home / Self Care | Attending: Internal Medicine | Admitting: Internal Medicine

## 2024-06-07 DIAGNOSIS — Z86711 Personal history of pulmonary embolism: Secondary | ICD-10-CM | POA: Diagnosis not present

## 2024-06-07 DIAGNOSIS — D751 Secondary polycythemia: Secondary | ICD-10-CM | POA: Diagnosis present

## 2024-06-07 DIAGNOSIS — F411 Generalized anxiety disorder: Secondary | ICD-10-CM | POA: Diagnosis present

## 2024-06-07 DIAGNOSIS — F10931 Alcohol use, unspecified with withdrawal delirium: Secondary | ICD-10-CM | POA: Diagnosis not present

## 2024-06-07 DIAGNOSIS — Z79899 Other long term (current) drug therapy: Secondary | ICD-10-CM

## 2024-06-07 DIAGNOSIS — Z888 Allergy status to other drugs, medicaments and biological substances status: Secondary | ICD-10-CM | POA: Diagnosis not present

## 2024-06-07 DIAGNOSIS — E039 Hypothyroidism, unspecified: Secondary | ICD-10-CM | POA: Diagnosis present

## 2024-06-07 DIAGNOSIS — E860 Dehydration: Secondary | ICD-10-CM | POA: Diagnosis present

## 2024-06-07 DIAGNOSIS — Z8249 Family history of ischemic heart disease and other diseases of the circulatory system: Secondary | ICD-10-CM

## 2024-06-07 DIAGNOSIS — E66813 Obesity, class 3: Secondary | ICD-10-CM | POA: Diagnosis present

## 2024-06-07 DIAGNOSIS — Z79890 Hormone replacement therapy (postmenopausal): Secondary | ICD-10-CM | POA: Diagnosis not present

## 2024-06-07 DIAGNOSIS — Z6841 Body Mass Index (BMI) 40.0 and over, adult: Secondary | ICD-10-CM | POA: Diagnosis not present

## 2024-06-07 DIAGNOSIS — G500 Trigeminal neuralgia: Secondary | ICD-10-CM | POA: Diagnosis present

## 2024-06-07 DIAGNOSIS — Z818 Family history of other mental and behavioral disorders: Secondary | ICD-10-CM

## 2024-06-07 DIAGNOSIS — R110 Nausea: Secondary | ICD-10-CM | POA: Diagnosis present

## 2024-06-07 DIAGNOSIS — Z7901 Long term (current) use of anticoagulants: Secondary | ICD-10-CM

## 2024-06-07 DIAGNOSIS — F10231 Alcohol dependence with withdrawal delirium: Principal | ICD-10-CM | POA: Diagnosis present

## 2024-06-07 DIAGNOSIS — I10 Essential (primary) hypertension: Secondary | ICD-10-CM | POA: Diagnosis present

## 2024-06-07 DIAGNOSIS — R7989 Other specified abnormal findings of blood chemistry: Secondary | ICD-10-CM | POA: Diagnosis present

## 2024-06-07 DIAGNOSIS — F10939 Alcohol use, unspecified with withdrawal, unspecified: Principal | ICD-10-CM

## 2024-06-07 DIAGNOSIS — G4700 Insomnia, unspecified: Secondary | ICD-10-CM | POA: Diagnosis present

## 2024-06-07 DIAGNOSIS — Z813 Family history of other psychoactive substance abuse and dependence: Secondary | ICD-10-CM | POA: Diagnosis not present

## 2024-06-07 LAB — URINE DRUG SCREEN
Amphetamines: NEGATIVE
Barbiturates: POSITIVE — AB
Benzodiazepines: POSITIVE — AB
Cocaine: NEGATIVE
Fentanyl: NEGATIVE
Methadone Scn, Ur: NEGATIVE
Opiates: NEGATIVE
Tetrahydrocannabinol: NEGATIVE

## 2024-06-07 LAB — CBC
HCT: 52.6 % — ABNORMAL HIGH (ref 39.0–52.0)
Hemoglobin: 17 g/dL (ref 13.0–17.0)
MCH: 27.9 pg (ref 26.0–34.0)
MCHC: 32.3 g/dL (ref 30.0–36.0)
MCV: 86.4 fL (ref 80.0–100.0)
Platelets: 267 K/uL (ref 150–400)
RBC: 6.09 MIL/uL — ABNORMAL HIGH (ref 4.22–5.81)
RDW: 15 % (ref 11.5–15.5)
WBC: 7.1 K/uL (ref 4.0–10.5)
nRBC: 0 % (ref 0.0–0.2)

## 2024-06-07 LAB — COMPREHENSIVE METABOLIC PANEL WITH GFR
ALT: 156 U/L — ABNORMAL HIGH (ref 0–44)
AST: 102 U/L — ABNORMAL HIGH (ref 15–41)
Albumin: 5 g/dL (ref 3.5–5.0)
Alkaline Phosphatase: 103 U/L (ref 38–126)
Anion gap: 17 — ABNORMAL HIGH (ref 5–15)
BUN: 10 mg/dL (ref 6–20)
CO2: 21 mmol/L — ABNORMAL LOW (ref 22–32)
Calcium: 9.7 mg/dL (ref 8.9–10.3)
Chloride: 100 mmol/L (ref 98–111)
Creatinine, Ser: 0.87 mg/dL (ref 0.61–1.24)
GFR, Estimated: >60 mL/min (ref >60)
Glucose, Bld: 115 mg/dL — ABNORMAL HIGH (ref 70–99)
Potassium: 4.5 mmol/L (ref 3.5–5.1)
Sodium: 139 mmol/L (ref 135–145)
Total Bilirubin: 0.4 mg/dL (ref 0.0–1.2)
Total Protein: 7.8 g/dL (ref 6.5–8.1)

## 2024-06-07 LAB — HEPATITIS PANEL, ACUTE
HCV Ab: NONREACTIVE
Hep A IgM: NONREACTIVE
Hep B C IgM: NONREACTIVE
Hepatitis B Surface Ag: NONREACTIVE

## 2024-06-07 LAB — PHOSPHORUS: Phosphorus: 3.7 mg/dL (ref 2.5–4.6)

## 2024-06-07 LAB — PROTIME-INR
INR: 1.1 (ref 0.8–1.2)
Prothrombin Time: 14.5 s (ref 11.4–15.2)

## 2024-06-07 LAB — MAGNESIUM: Magnesium: 2.2 mg/dL (ref 1.7–2.4)

## 2024-06-07 LAB — ETHANOL: Alcohol, Ethyl (B): <15 mg/dL (ref <15)

## 2024-06-07 MED ORDER — LORAZEPAM 2 MG/ML IJ SOLN
0.0000 mg | Freq: Two times a day (BID) | INTRAMUSCULAR | Status: DC
  Administered 2024-06-10: 2 mg via INTRAVENOUS
  Filled 2024-06-07 (×2): qty 1

## 2024-06-07 MED ORDER — LORAZEPAM 2 MG/ML IJ SOLN
1.0000 mg | INTRAMUSCULAR | Status: DC | PRN
  Administered 2024-06-07: 3 mg via INTRAVENOUS
  Administered 2024-06-07: 2 mg via INTRAVENOUS
  Administered 2024-06-08: 1 mg via INTRAVENOUS
  Administered 2024-06-09 – 2024-06-10 (×4): 2 mg via INTRAVENOUS
  Filled 2024-06-07 (×3): qty 1
  Filled 2024-06-07: qty 2
  Filled 2024-06-07 (×2): qty 1

## 2024-06-07 MED ORDER — LORAZEPAM 2 MG/ML IJ SOLN
2.0000 mg | Freq: Once | INTRAMUSCULAR | Status: AC
  Administered 2024-06-07: 2 mg via INTRAVENOUS
  Filled 2024-06-07: qty 1

## 2024-06-07 MED ORDER — SODIUM CHLORIDE 0.9 % IV BOLUS
1000.0000 mL | Freq: Once | INTRAVENOUS | Status: AC
  Administered 2024-06-07: 1000 mL via INTRAVENOUS

## 2024-06-07 MED ORDER — APIXABAN 5 MG PO TABS
5.0000 mg | ORAL_TABLET | Freq: Two times a day (BID) | ORAL | Status: DC
  Administered 2024-06-07 – 2024-06-10 (×6): 5 mg via ORAL
  Filled 2024-06-07 (×6): qty 1

## 2024-06-07 MED ORDER — PROPRANOLOL HCL 20 MG PO TABS
20.0000 mg | ORAL_TABLET | Freq: Three times a day (TID) | ORAL | Status: DC | PRN
  Administered 2024-06-09: 20 mg via ORAL
  Filled 2024-06-07: qty 1

## 2024-06-07 MED ORDER — LISINOPRIL 20 MG PO TABS
20.0000 mg | ORAL_TABLET | Freq: Every day | ORAL | Status: DC
  Administered 2024-06-07 – 2024-06-08 (×2): 20 mg via ORAL
  Filled 2024-06-07 (×2): qty 1

## 2024-06-07 MED ORDER — LORAZEPAM 2 MG/ML IJ SOLN
0.0000 mg | Freq: Four times a day (QID) | INTRAMUSCULAR | Status: AC
  Administered 2024-06-07: 1 mg via INTRAVENOUS
  Administered 2024-06-08: 3 mg via INTRAVENOUS
  Administered 2024-06-08 – 2024-06-09 (×4): 2 mg via INTRAVENOUS
  Filled 2024-06-07 (×6): qty 1
  Filled 2024-06-07: qty 2

## 2024-06-07 MED ORDER — ADULT MULTIVITAMIN W/MINERALS CH
1.0000 | ORAL_TABLET | Freq: Every day | ORAL | Status: DC
  Administered 2024-06-07 – 2024-06-10 (×4): 1 via ORAL
  Filled 2024-06-07 (×4): qty 1

## 2024-06-07 MED ORDER — KCL IN DEXTROSE-NACL 20-5-0.45 MEQ/L-%-% IV SOLN
INTRAVENOUS | Status: AC
  Filled 2024-06-07 (×3): qty 1000

## 2024-06-07 MED ORDER — THIAMINE MONONITRATE 100 MG PO TABS
100.0000 mg | ORAL_TABLET | Freq: Every day | ORAL | Status: DC
  Administered 2024-06-07 – 2024-06-10 (×4): 100 mg via ORAL
  Filled 2024-06-07 (×4): qty 1

## 2024-06-07 MED ORDER — ONDANSETRON HCL 4 MG PO TABS: 4.0000 mg | ORAL_TABLET | Freq: Four times a day (QID) | ORAL | Status: DC | PRN

## 2024-06-07 MED ORDER — TRAZODONE HCL 100 MG PO TABS
100.0000 mg | ORAL_TABLET | Freq: Every evening | ORAL | Status: DC | PRN
  Administered 2024-06-07: 100 mg via ORAL
  Filled 2024-06-07: qty 1

## 2024-06-07 MED ORDER — FOLIC ACID 1 MG PO TABS
1.0000 mg | ORAL_TABLET | Freq: Every day | ORAL | Status: DC
  Administered 2024-06-07 – 2024-06-10 (×4): 1 mg via ORAL
  Filled 2024-06-07 (×4): qty 1

## 2024-06-07 MED ORDER — DULOXETINE HCL 60 MG PO CPEP
120.0000 mg | ORAL_CAPSULE | Freq: Every day | ORAL | Status: DC
  Administered 2024-06-08 – 2024-06-10 (×3): 120 mg via ORAL
  Filled 2024-06-07: qty 4
  Filled 2024-06-07 (×2): qty 2
  Filled 2024-06-07: qty 4
  Filled 2024-06-07: qty 6
  Filled 2024-06-07: qty 2

## 2024-06-07 MED ORDER — KCL IN DEXTROSE-NACL 10-5-0.45 MEQ/L-%-% IV SOLN
INTRAVENOUS | Status: DC
  Filled 2024-06-07: qty 1000

## 2024-06-07 MED ORDER — ONDANSETRON HCL 4 MG/2ML IJ SOLN: 4.0000 mg | Freq: Four times a day (QID) | INTRAMUSCULAR | Status: DC | PRN

## 2024-06-07 MED ORDER — THIAMINE HCL 100 MG/ML IJ SOLN
100.0000 mg | Freq: Every day | INTRAMUSCULAR | Status: DC
  Filled 2024-06-07: qty 2

## 2024-06-07 MED ORDER — ALLOPURINOL 300 MG PO TABS
300.0000 mg | ORAL_TABLET | Freq: Every day | ORAL | Status: DC
  Administered 2024-06-08 – 2024-06-10 (×3): 300 mg via ORAL
  Filled 2024-06-07 (×3): qty 1

## 2024-06-07 MED ORDER — LORAZEPAM 1 MG PO TABS
1.0000 mg | ORAL_TABLET | ORAL | Status: DC | PRN
  Administered 2024-06-08 – 2024-06-10 (×3): 2 mg via ORAL
  Filled 2024-06-07 (×3): qty 2

## 2024-06-07 MED ORDER — LEVOTHYROXINE SODIUM 75 MCG PO TABS
75.0000 ug | ORAL_TABLET | Freq: Every day | ORAL | Status: DC
  Administered 2024-06-08 – 2024-06-10 (×3): 75 ug via ORAL
  Filled 2024-06-07 (×3): qty 1

## 2024-06-07 MED ORDER — MELATONIN 5 MG PO TABS: 5.0000 mg | ORAL_TABLET | Freq: Every evening | ORAL | Status: DC | PRN

## 2024-06-07 MED ORDER — POLYETHYLENE GLYCOL 3350 17 G PO PACK: 17.0000 g | PACK | Freq: Every day | ORAL | Status: DC | PRN

## 2024-06-07 NOTE — ED Provider Notes (Signed)
 [place] EMERGENCY DEPARTMENT AT [facility] Provider Note   CSN: [id number] Arrival date & time: 06/07/24  1259     Patient presents with: Alcohol Problem   [name] is a 43 y.o. male.   43 year old male with history of alcohol abuse presents for alcohol withdrawal. He states he has tremors and is very nauseous. States he had 8 beers today prior to arrival. States he has been drinking daily since his most recent discharge after being admitted for ETOH abuse. States he wants to go to inpatient rehab at some point.    Alcohol Problem Pertinent negatives include no chest pain, no abdominal pain and no shortness of breath.       Prior to Admission medications   Medication Sig Start Date End Date Taking? Authorizing Provider  allopurinol  (ZYLOPRIM ) 300 MG tablet Take 300 mg by mouth daily.    [provider]  apixaban  (ELIQUIS ) 5 MG TABS tablet Take 1 tablet (5 mg total) by mouth 2 (two) times daily. 03/09/24   Darlean Ozell NOVAK, MD  atorvastatin  (LIPITOR) 20 MG tablet Take 20 mg by mouth daily.    [provider]  DULoxetine  (CYMBALTA ) 60 MG capsule Take 120 mg by mouth daily.    [provider]  famotidine -calcium  carbonate-magnesium  hydroxide (PEPCID  COMPLETE) 10-800-165 MG chewable tablet Chew 1 tablet by mouth daily as needed (for acid reflux).    [provider]  levothyroxine  (SYNTHROID ) 75 MCG tablet Take 75 mcg by mouth daily before breakfast. 12/21/23   [provider]  Melatonin 5 MG TBDP Take 5 mg by mouth at bedtime as needed (sleep). 02/26/24   [provider]  methocarbamol  (ROBAXIN ) 500 MG tablet Take 500 mg by mouth daily as needed for muscle spasms. 03/08/24   [provider]  propranolol  (INDERAL ) 20 MG tablet Take 20 mg by mouth 3 (three) times daily as needed (Anxiety).    [provider]  traZODone  (DESYREL ) 100 MG tablet Take 100 mg by mouth at bedtime as needed for sleep.     [provider]    Allergies: Lexapro [escitalopram oxalate] and Zoloft [sertraline hcl]    Review of Systems  Constitutional:  Negative for chills and fever.  HENT:  Negative for ear pain and sore throat.   Eyes:  Negative for pain and visual disturbance.  Respiratory:  Negative for cough and shortness of breath.   Cardiovascular:  Negative for chest pain and palpitations.  Gastrointestinal:  Negative for abdominal pain and vomiting.  Genitourinary:  Negative for dysuria and hematuria.  Musculoskeletal:  Negative for arthralgias and back pain.  Skin:  Negative for color change and rash.  Neurological:  Positive for weakness and light-headedness. Negative for seizures and syncope.  Psychiatric/Behavioral:  Positive for agitation.   All other systems reviewed and are negative.   Updated Vital Signs BP (!) 155/121 (BP Location: Right Arm)   Pulse (!) 113   Temp 98.3 F (36.8 C) (Oral)   Resp 19   SpO2 100%   Physical Exam Vitals and nursing note reviewed.  Constitutional:      General: He is not in acute distress.    Appearance: Normal appearance. He is well-developed. He is ill-appearing.     Comments: diaphoretic  HENT:     Head: Normocephalic and atraumatic.  Eyes:     Conjunctiva/sclera: Conjunctivae normal.  Cardiovascular:     Rate and Rhythm: Regular rhythm. Tachycardia present.     Heart sounds:  No murmur heard. Pulmonary:     Effort: Pulmonary effort is normal. No respiratory distress.     Breath sounds: Normal breath sounds.  Abdominal:     Palpations: Abdomen is soft.     Tenderness: There is no abdominal tenderness.  Musculoskeletal:        General: No swelling.     Cervical back: Neck supple.  Skin:    General: Skin is warm and dry.     Capillary Refill: Capillary refill takes less than 2 seconds.  Neurological:     Mental Status: He is alert.  Psychiatric:        Mood and Affect: Mood normal.     (all labs ordered are listed, but only  abnormal results are displayed) Labs Reviewed  COMPREHENSIVE METABOLIC PANEL WITH GFR - Abnormal; Notable for the following components:      Result Value   CO2 21 (*)    Glucose, Bld 115 (*)    AST 102 (*)    ALT 156 (*)    Anion gap 17 (*)    All other components within normal limits  CBC - Abnormal; Notable for the following components:   RBC 6.09 (*)    HCT 52.6 (*)    All other components within normal limits  ETHANOL  URINE DRUG SCREEN    EKG: EKG Interpretation Date/Time:  Thursday June 07 2024 13:37:07 EDT Ventricular Rate:  115 PR Interval:  131 QRS Duration:  94 QT Interval:  335 QTC Calculation: 464 R Axis:   90  Text Interpretation: Sinus tachycardia Borderline right axis deviation Baseline wander in lead(s) I II Compared with prior EKG from 05/28/2024 Confirmed by Gennaro Bouchard ([id number]) on 06/07/2024 1:51:36 PM  Radiology: No results found.   Procedures   Medications Ordered in the ED  dextrose  5 % and 0.45 % NaCl with KCl 10 mEq/L infusion (has no administration in time range)  LORazepam  (ATIVAN ) injection 2 mg (2 mg Intravenous Given 06/07/24 1516)  sodium chloride  0.9 % bolus 1,000 mL (1,000 mLs Intravenous New Bag/Given 06/07/24 1517)                                    Medical Decision Making Cardiac monitor interpretation: Sinus tachycardia, no ectopy  Social determinants of health: History of alcohol and drug abuse and dependence  Patient's lab workup reviewed by me fairly unremarkable.  He is tremulous tachycardic hypertensive and diaphoretic.  Alcohol is negative.  Patient for withdrawal with Ativan  and IV fluids.  Discussed patient's case with hospitalist, Dr. Celinda and patient will be admitted for further workup and management.  Patient is agreeable to plan.  He states he is very interested in pursuing inpatient rehab at this time.  Problems Addressed: Alcohol withdrawal syndrome with complication [facility]): acute illness or injury that poses a  threat to life or bodily functions  Amount and/or Complexity of Data Reviewed External Data Reviewed: notes.    Details: Prior ED and hospital records reviewed and patient has been seen multiple times in the past for alcohol withdrawal Labs: ordered. Decision-making details documented in ED Course.    Details: Ordered and reviewed by me and unremarkable ECG/medicine tests: ordered and independent interpretation performed. Decision-making details documented in ED Course.    Details: Ordered and inter by me in the absence of cardiology and shows sinus tachycardia no STEMI or acute change when compared to prior  Risk  OTC drugs. Prescription drug management. Parenteral controlled substances. Drug therapy requiring intensive monitoring for toxicity. Decision regarding hospitalization. Diagnosis or treatment significantly limited by social determinants of health.    Final diagnoses:  Alcohol withdrawal syndrome with complication [facility])    ED Discharge Orders     None          Gennaro Duwaine CROME, DO 06/07/24 1603

## 2024-06-07 NOTE — ED Triage Notes (Signed)
 Pt states he has had 8 hi-gravity beers. Pt states he is an alcoholic and was recently admitted and has been drinking every day since discharge. Endorses that he takes dextromethorphan  daily for years and uses marijuana daily. Last drink 12pm today.

## 2024-06-07 NOTE — H&P (Signed)
 History and Physical  JEANPIERRE Clarke FMW:[id number] DOB: Apr 29, 1981 DOA: 06/07/2024  PCP: Cleotilde Planas, MD Patient coming from: Home  I have personally briefly reviewed patient's old medical records in [facility]   Chief Complaint: Alcohol withdrawal with formication's  HPI: Juan Clarke is a 43 y.o. male past medical history of heavy alcohol use with history of severe withdrawals and DTs requiring intubation in the past, history of serotonin syndrome class III morbid obesity essential hypertension history of PE in June 2025 on Eliquis  with multiple admissions for alcohol withdrawal recently discharged on 05/30/2024 comes in for alcohol withdrawal shaking and formication, he relates his last drink was the day of arrival (I believe he meant to say it was a day prior to admission).  He denies any nausea vomiting fever chills cough shortness of breath.  He denies any other drugs.  In the ED: He was found to be hypertensive tachycardic profusely sweating, actually electrolytes are unremarkable except for elevated LFTs, unremarkable bilirubin, hemoglobin of 17 white count of 7.1 and platelet count of 261, twelve-lead EKG as below   Review of Systems: All systems reviewed and apart from history of presenting illness, are negative.  Past Medical History:  Diagnosis Date   GAD (generalized anxiety disorder)    HTN (hypertension)    Insomnia    Migraine without aura    Overweight    Trigeminal neuralgia    Past Surgical History:  Procedure Laterality Date   Face surgery  11/14/14   TONSILLECTOMY     43 yr old   Social History:  reports that he has never smoked. He has never used smokeless tobacco. He reports current alcohol use of about 42.0 standard drinks of alcohol per week. He reports current drug use. Frequency: 7.00 times per week. Drug: Marijuana.   Allergies  Allergen Reactions   Lexapro [Escitalopram Oxalate] Other (See Comments)    Lack of therapeutic effect     Zoloft [Sertraline Hcl] Other (See Comments)    Lack of therapeutic effect     Family History  Problem Relation Age of Onset   Migraines Mother    Hypertension Father    Mental illness Sister    Drug abuse Sister    Brain cancer Maternal Grandmother    Heart disease Paternal Grandfather     Prior to Admission medications   Medication Sig Start Date End Date Taking? Authorizing Provider  acamprosate  (CAMPRAL ) 333 MG tablet Take 2 tablets (666 mg total) by mouth 3 (three) times daily. 05/09/24 08/07/24  [place], Camellia PARAS, DO  acetaminophen  (TYLENOL ) 325 MG tablet Take 650 mg by mouth daily as needed for mild pain (pain score 1-3), headache or moderate pain (pain score 4-6).    [provider]  allopurinol  (ZYLOPRIM ) 300 MG tablet Take 300 mg by mouth daily.    [provider]  apixaban  (ELIQUIS ) 5 MG TABS tablet Take 1 tablet (5 mg total) by mouth 2 (two) times daily. 03/09/24   Darlean Ozell NOVAK, MD  atorvastatin  (LIPITOR) 20 MG tablet Take 20 mg by mouth daily.    [provider]  DULoxetine  (CYMBALTA ) 60 MG capsule Take 120 mg by mouth daily.    [provider]  famotidine -calcium  carbonate-magnesium  hydroxide (PEPCID  COMPLETE) 10-800-165 MG chewable tablet Chew 1 tablet by mouth daily as needed (for acid reflux).    [provider]  levothyroxine  (SYNTHROID ) 75 MCG tablet Take 75 mcg by mouth daily before breakfast. 12/21/23   [provider]  lisinopril -hydrochlorothiazide  (ZESTORETIC ) 20-25 MG tablet Take 1 tablet by mouth daily.    [provider]  Melatonin 5 MG TBDP Take 5 mg by mouth at bedtime as needed (sleep). 02/26/24   [provider]  methocarbamol  (ROBAXIN ) 500 MG tablet Take 500 mg by mouth daily as needed for muscle spasms. 03/08/24   [provider]  propranolol  (INDERAL ) 20 MG tablet Take 20 mg by mouth 3 (three) times daily as needed (Anxiety).    [provider]  traZODone  (DESYREL ) 100 MG  tablet Take 100 mg by mouth at bedtime as needed for sleep.    [provider]   Physical Exam: Vitals:   06/07/24 1309  BP: (!) 166/125  Pulse: (!) 112  Resp: 20  Temp: 97.7 F (36.5 C)  TempSrc: Oral  SpO2: 99%    General exam: Moderately built and obese sweating profusely Head, eyes and ENT: Nontraumatic and normocephalic.  Pulls equally round and reactive to light Neck: Supple. No JVD, carotid bruit or thyromegaly. Lymphatics: No lymphadenopathy. Respiratory system: Clear to auscultation. No increased work of breathing. Cardiovascular system: S1-S2 regular rate and rhythm no murmurs rubs or gallops appreciated.  Await Gastrointestinal system: Abdomen is nondistended, soft and nontender. Normal bowel sounds heard. No organomegaly or masses appreciated. Central nervous system: Alert and oriented x 3 nonfocal on physical exam but very tremulous Extremities: Symmetric 5 x 5 power. Peripheral pulses symmetrically felt.  Skin: No rashes or acute findings. Musculoskeletal system: Negative exam. Psychiatry: Pleasant and cooperative.   Labs on Admission:  Basic Metabolic Panel: Recent Labs  Lab 06/07/24 1318  NA 139  K 4.5  CL 100  CO2 21*  GLUCOSE 115*  BUN 10  CREATININE 0.87  CALCIUM  9.7   Liver Function Tests: Recent Labs  Lab 06/07/24 1318  AST 102*  ALT 156*  ALKPHOS 103  BILITOT 0.4  PROT 7.8  ALBUMIN 5.0   No results for input(s): LIPASE, AMYLASE in the last 168 hours. No results for input(s): AMMONIA in the last 168 hours. CBC: Recent Labs  Lab 06/07/24 1318  WBC 7.1  HGB 17.0  HCT 52.6*  MCV 86.4  PLT 267   Cardiac Enzymes: No results for input(s): CKTOTAL, CKMB, CKMBINDEX, TROPONINI in the last 168 hours.  BNP (last 3 results) No results for input(s): PROBNP in the last 8760 hours. CBG: No results for input(s): GLUCAP in the last 168 hours.  Radiological Exams on Admission: No results found.  EKG:  Independently reviewed.  Sinus tachycardia left axis deviation nonspecific T wave changes  Assessment/Plan Alcohol withdrawal delirium (HCC) He was just discharged from the hospital I had a long talk with him about abstinence and quitting. He he relates that he really does want to quit.  He would like to meet with social worker try to see if we can get him into an inpatient alcohol withdrawal program. Go ahead and give him a liter of normal saline. Start him on D5 half-normal saline with potassium supplement.  Monitor strict I's and O's Daily weights. Will give him 4 mg of Ativan  and start him on the Ativan  protocol. Will give him thiamine  and folate. Check a magnesium  and phosphorus.  Replete as needed.  Elevated LFTs: AST is higher than the ALT 1.5-1, it was like this before he was discharged on 05/30/2024.  Fortunately bilirubin and alkaline phosphatase are unremarkable. Will check an acute hepatitis panel, he denies any nausea vomiting or abdominal pain. Check a PT/INR.  History of PE: He was positive for PE on 01/31/2024, resume Eliquis .  During that time he had no right heart strain.  Erythrocytosis: Likely hemoconcentrated from dehydration. Will start him on IV fluids and recheck CBC tomorrow morning.  Essential hypertension: Resume ACE inhibitor and hold hydrochlorothiazide . Resume propranolol  as needed for anxiety.  Generalized anxiety disorder Resume duloxetine , continue trazodone  at night.  Hypothyroidism:  Continue Synthroid .    DVT Prophylaxis: Eliquis  Code Status: Full  Family Communication: none  Disposition Plan: Inpatient  Time spent: Greater than 75 minutes.    It is my clinical opinion that admission to INPATIENT is reasonable and necessary in this 43 y.o. male acute alcohol withdrawal.  Given the aforementioned, the predictability of an adverse outcome is felt to be significant. I expect that the patient will require at least 2 midnights in the hospital  to treat this condition.  Erle Odell Castor MD [facility]   06/07/2024, 3:11 PM

## 2024-06-07 NOTE — Plan of Care (Signed)
  Problem: Education: Goal: Knowledge of General Education information will improve Description: Including pain rating scale, medication(s)/side effects and non-pharmacologic comfort measures Outcome: Progressing   Problem: Clinical Measurements: Goal: Ability to maintain clinical measurements within normal limits will improve Outcome: Progressing Goal: Diagnostic test results will improve Outcome: Progressing   Problem: Nutrition: Goal: Adequate nutrition will be maintained Outcome: Progressing   Problem: Coping: Goal: Level of anxiety will decrease Outcome: Progressing   Problem: Elimination: Goal: Will not experience complications related to bowel motility Outcome: Progressing Goal: Will not experience complications related to urinary retention Outcome: Progressing   Problem: Pain Managment: Goal: General experience of comfort will improve and/or be controlled Outcome: Progressing   Problem: Safety: Goal: Ability to remain free from injury will improve Outcome: Progressing

## 2024-06-07 NOTE — Plan of Care (Signed)

## 2024-06-08 DIAGNOSIS — F10931 Alcohol use, unspecified with withdrawal delirium: Secondary | ICD-10-CM | POA: Diagnosis not present

## 2024-06-08 LAB — COMPREHENSIVE METABOLIC PANEL WITH GFR
ALT: 105 U/L — ABNORMAL HIGH (ref 0–44)
AST: 58 U/L — ABNORMAL HIGH (ref 15–41)
Albumin: 4.3 g/dL (ref 3.5–5.0)
Alkaline Phosphatase: 90 U/L (ref 38–126)
Anion gap: 14 (ref 5–15)
BUN: 14 mg/dL (ref 6–20)
CO2: 20 mmol/L — ABNORMAL LOW (ref 22–32)
Calcium: 9.2 mg/dL (ref 8.9–10.3)
Chloride: 105 mmol/L (ref 98–111)
Creatinine, Ser: 0.9 mg/dL (ref 0.61–1.24)
GFR, Estimated: >60 mL/min (ref >60)
Glucose, Bld: 108 mg/dL — ABNORMAL HIGH (ref 70–99)
Potassium: 4.1 mmol/L (ref 3.5–5.1)
Sodium: 138 mmol/L (ref 135–145)
Total Bilirubin: 0.5 mg/dL (ref 0.0–1.2)
Total Protein: 6.5 g/dL (ref 6.5–8.1)

## 2024-06-08 LAB — CBC
HCT: 50.3 % (ref 39.0–52.0)
Hemoglobin: 15.8 g/dL (ref 13.0–17.0)
MCH: 28.1 pg (ref 26.0–34.0)
MCHC: 31.4 g/dL (ref 30.0–36.0)
MCV: 89.5 fL (ref 80.0–100.0)
Platelets: 210 K/uL (ref 150–400)
RBC: 5.62 MIL/uL (ref 4.22–5.81)
RDW: 15.6 % — ABNORMAL HIGH (ref 11.5–15.5)
WBC: 9.1 K/uL (ref 4.0–10.5)
nRBC: 0 % (ref 0.0–0.2)

## 2024-06-08 LAB — CK: Total CK: 134 U/L (ref 49–397)

## 2024-06-08 MED ORDER — LISINOPRIL 20 MG PO TABS
40.0000 mg | ORAL_TABLET | Freq: Every day | ORAL | Status: DC
  Administered 2024-06-09 – 2024-06-10 (×2): 40 mg via ORAL
  Filled 2024-06-08 (×2): qty 2

## 2024-06-08 MED ORDER — HYDROCHLOROTHIAZIDE 12.5 MG PO TABS
12.5000 mg | ORAL_TABLET | Freq: Every day | ORAL | Status: DC
  Administered 2024-06-08 – 2024-06-10 (×3): 12.5 mg via ORAL
  Filled 2024-06-08 (×3): qty 1

## 2024-06-08 MED ORDER — LORAZEPAM 2 MG/ML IJ SOLN
2.0000 mg | INTRAMUSCULAR | Status: AC
  Administered 2024-06-08: 2 mg via INTRAVENOUS
  Filled 2024-06-08: qty 1

## 2024-06-08 NOTE — Progress Notes (Signed)
   06/07/24 2318  Vitals  Temp 98.1 F (36.7 C)  Temp Source Oral  BP (!) 168/110  MAP (mmHg) 126  BP Location Right Arm  BP Method Automatic  Patient Position (if appropriate) Lying  Pulse Rate (!) 122  Pulse Rate Source Monitor  Resp 18  MEWS COLOR  MEWS Score Color Yellow  Oxygen Therapy  SpO2 99 %  O2 Device Room Air  MEWS Score  MEWS Temp 0  MEWS Systolic 0  MEWS Pulse 2  MEWS RR 0  MEWS LOC 0  MEWS Score 2  Provider Notification  Provider Name/Title Lavanda Horns, NP  Date Provider Notified 06/08/24  Time Provider Notified 2320  Method of Notification Page  Notification Reason Other (Comment) (yellow MEWS)  Provider response No new orders  Date of Provider Response 06/08/24  Time of Provider Response 2124   CIWA meds given for elevated BP and Pulse.

## 2024-06-08 NOTE — Progress Notes (Signed)
   06/07/24 1815  Assess: MEWS Score  BP (!) 167/122  MAP (mmHg) 136  Pulse Rate (!) 113  Resp 18  SpO2 98 %  O2 Device Room Air  Assess: MEWS Score  MEWS Temp 0  MEWS Systolic 0  MEWS Pulse 2  MEWS RR 0  MEWS LOC 0  MEWS Score 2  MEWS Score Color Yellow  Assess: if the MEWS score is Yellow or Red  Were vital signs accurate and taken at a resting state? Yes  Does the patient meet 2 or more of the SIRS criteria? Yes  MEWS guidelines implemented  Yes, yellow  Treat  MEWS Interventions Considered administering scheduled or prn medications/treatments as ordered  Take Vital Signs  Increase Vital Sign Frequency  Yellow: Q2hr x1, continue Q4hrs until patient remains green for 12hrs  Escalate  MEWS: Escalate Yellow: Discuss with charge nurse and consider notifying provider and/or RRT  Notify: Charge Nurse/RN  Name of Charge Nurse/RN Notified [facility]  Provider Notification  Provider Name/Title Celinda Clunes  Date Provider Notified 06/07/24  Time Provider Notified 1820  Method of Notification Page  Notification Reason Change in status  Assess: SIRS CRITERIA  SIRS Temperature  0  SIRS Respirations  0  SIRS Pulse 1  SIRS WBC 0  SIRS Score Sum  1

## 2024-06-08 NOTE — Plan of Care (Signed)
  Problem: Education: Goal: Knowledge of General Education information will improve Description: Including pain rating scale, medication(s)/side effects and non-pharmacologic comfort measures Outcome: Progressing   Problem: Health Behavior/Discharge Planning: Goal: Ability to manage health-related needs will improve Outcome: Progressing   Problem: Clinical Measurements: Goal: Will remain free from infection Outcome: Progressing Goal: Diagnostic test results will improve Outcome: Progressing   Problem: Nutrition: Goal: Adequate nutrition will be maintained Outcome: Progressing   Problem: Coping: Goal: Level of anxiety will decrease Outcome: Progressing   Problem: Elimination: Goal: Will not experience complications related to urinary retention Outcome: Progressing

## 2024-06-08 NOTE — Progress Notes (Signed)
   06/08/24 0456  Vitals  Temp 98 F (36.7 C)  BP (!) 160/107  MAP (mmHg) 118  BP Location Left Arm  BP Method Automatic  Patient Position (if appropriate) Lying  Pulse Rate (!) 127  Pulse Rate Source Monitor  Resp 19  MEWS COLOR  MEWS Score Color Yellow  Oxygen Therapy  SpO2 98 %  O2 Device Room Air  Pain Assessment  Pain Scale 0-10  Pain Score 0  POSS Scale ([place]ero Opioid Sedation Scale)  POSS *See Group Information* S-Acceptable,Sleep, easy to arouse  MEWS Score  MEWS Temp 0  MEWS Systolic 0  MEWS Pulse 2  MEWS RR 0  MEWS LOC 0  MEWS Score 2  Provider Notification  Provider Name/Title Lavanda Horns, NP  Date Provider Notified 06/08/24  Time Provider Notified 0500  Method of Notification Page  Notification Reason Other (Comment) (yellow MEWS)  Provider response No new orders  Date of Provider Response 06/08/24  Time of Provider Response [phone]

## 2024-06-08 NOTE — Progress Notes (Signed)
 [facility] PROGRESS NOTE    Progress Note  Juan Clarke  FMW:[id number] DOB: 03-Oct-1980 DOA: 06/07/2024 PCP: Cleotilde Planas, MD     Brief Narrative:   Juan Clarke is an 43 y.o. male past medical history of heavy alcohol use, with severe withdrawals and DTs in the past requiring intubation, with recurrent multiple admissions for alcohol withdrawal morbid obesity class III, essential hypertension, history of PE on Eliquis  comes in severe alcohol withdrawal.  Assessment/Plan:   Alcohol withdrawal delirium (HCC) Started on thiamine  and folate. D5 half-normal saline with potassium supplement. CIWA scores low. Continue CIWA protocol. Allow with diet.  Elevated LFTs: Acute hepatitis panel is negative. He denies any abdominal pain LFTs are trending down.  Is just this pattern not 2-1 AST ALT is inversed. Check CK.  History of PE: Continue Eliquis .  Essential hypertension: Continue lisinopril  start hydrochlorothiazide .  Erythrocytosis: Resolved with IV fluid resuscitation.  He was likely hemoconcentrated from decreased oral intake.  Generalized anxiety disorder Continue current home regimen.  Hypothyroidism: Continue Synthroid .   DVT prophylaxis: lovenox  Family Communication:none Status is: Inpatient Remains inpatient appropriate because: Severe alcohol withdrawal    Code Status:     Code Status Orders  (From admission, onward)           Start     Ordered   06/07/24 1516  Full code  Continuous       Question:  By:  Answer:  Consent: discussion documented in EHR   06/07/24 1518           Code Status History     Date Active Date Inactive Code Status Order ID Comments User Context   05/28/2024 0623 05/30/2024 2046 Full Code [id number]  Alfornia Madison, MD ED   05/07/2024 1111 05/09/2024 1848 Full Code [id number]  Celinda Alm Lot, MD Inpatient   02/07/2024 2117 02/09/2024 1854 Full Code [id number]  Silvester Ales, MD ED   01/31/2024 0953  02/04/2024 1726 Full Code [id number]  Celinda Alm Lot, MD ED   01/18/2024 0307 01/30/2024 2142 Full Code [id number]  Maree Harder, MD ED   08/10/2023 1240 08/17/2023 1321 Full Code [id number]  Lou Claretta HERO, MD ED   11/28/2022 0404 11/30/2022 1624 Full Code [id number]  Debby Camila LABOR, MD ED         IV Access:   Peripheral IV   Procedures and diagnostic studies:   No results found.   Medical Consultants:   None.   Subjective:    Juan Clarke relates he feels better than yesterday.  Objective:    Vitals:   06/08/24 0456 06/08/24 0703 06/08/24 0841 06/08/24 0900  BP: (!) 160/107 (!) 145/91 (!) 145/91 (!) 158/80  Pulse: (!) 127 (!) 127 (!) 127 (!) 112  Resp: 19  18 20   Temp: 98 F (36.7 C)  97.8 F (36.6 C) 99 F (37.2 C)  TempSrc:      SpO2: 98%  97% 98%  Weight:      Height:       SpO2: 98 %   Intake/Output Summary (Last 24 hours) at 06/08/2024 0949 Last data filed at 06/08/2024 0132 Gross per 24 hour  Intake 807.2 ml  Output --  Net 807.2 ml   Filed Weights   06/08/24 0127  Weight: 129.2 kg    Exam: General exam: In no acute distress. Respiratory system: Good air movement and clear to auscultation. Cardiovascular system: S1 & S2 heard, RRR. No JVD. Gastrointestinal system: Abdomen is nondistended, soft and nontender.  Extremities: No pedal edema. Skin: No rashes, lesions or ulcers Psychiatry: Judgement and insight appear normal. Mood & affect appropriate.    Data Reviewed:    Labs: Basic Metabolic Panel: Recent Labs  Lab 06/07/24 1318 06/07/24 1701 06/08/24 0529  NA 139  --  138  K 4.5  --  4.1  CL 100  --  105  CO2 21*  --  20*  GLUCOSE 115*  --  108*  BUN 10  --  14  CREATININE 0.87  --  0.90  CALCIUM  9.7  --  9.2  MG  --  2.2  --   PHOS  --  3.7  --    GFR Estimated Creatinine Clearance: 147 mL/min (by C-G formula based on SCr of 0.9 mg/dL). Liver Function Tests: Recent Labs  Lab 06/07/24 1318 06/08/24 0529   AST 102* 58*  ALT 156* 105*  ALKPHOS 103 90  BILITOT 0.4 0.5  PROT 7.8 6.5  ALBUMIN 5.0 4.3   No results for input(s): LIPASE, AMYLASE in the last 168 hours. No results for input(s): AMMONIA in the last 168 hours. Coagulation profile Recent Labs  Lab 06/07/24 1701  INR 1.1   COVID-19 Labs  No results for input(s): DDIMER, FERRITIN, LDH, CRP in the last 72 hours.  Lab Results  Component Value Date   SARSCOV2NAA NEGATIVE 02/07/2024   SARSCOV2NAA NEGATIVE 01/31/2024    CBC: Recent Labs  Lab 06/07/24 1318 06/08/24 0529  WBC 7.1 9.1  HGB 17.0 15.8  HCT 52.6* 50.3  MCV 86.4 89.5  PLT 267 210   Cardiac Enzymes: No results for input(s): CKTOTAL, CKMB, CKMBINDEX, TROPONINI in the last 168 hours. BNP (last 3 results) No results for input(s): PROBNP in the last 8760 hours. CBG: No results for input(s): GLUCAP in the last 168 hours. D-Dimer: No results for input(s): DDIMER in the last 72 hours. Hgb A1c: No results for input(s): HGBA1C in the last 72 hours. Lipid Profile: No results for input(s): CHOL, HDL, LDLCALC, TRIG, CHOLHDL, LDLDIRECT in the last 72 hours. Thyroid function studies: No results for input(s): TSH, T4TOTAL, T3FREE, THYROIDAB in the last 72 hours.  Invalid input(s): FREET3 Anemia work up: No results for input(s): VITAMINB12, FOLATE, FERRITIN, TIBC, IRON, RETICCTPCT in the last 72 hours. Sepsis Labs: Recent Labs  Lab 06/07/24 1318 06/08/24 0529  WBC 7.1 9.1   Microbiology No results found for this or any previous visit (from the past 240 hours).   Medications:    allopurinol   300 mg Oral Daily   apixaban   5 mg Oral BID   DULoxetine   120 mg Oral Daily   folic acid   1 mg Oral Daily   levothyroxine   75 mcg Oral QAC breakfast   lisinopril   20 mg Oral Daily   LORazepam   0-4 mg Intravenous Q6H   Followed by   NOREEN ON 06/09/2024] LORazepam   0-4 mg Intravenous Q12H    multivitamin with minerals  1 tablet Oral Daily   thiamine   100 mg Oral Daily   Or   thiamine   100 mg Intravenous Daily   Continuous Infusions:  dextrose  5 % and 0.45 % NaCl with KCl 20 mEq/L 100 mL/hr at 06/08/24 0405      LOS: 1 day   Erle Odell Castor  [facility]  06/08/2024, 9:49 AM

## 2024-06-09 DIAGNOSIS — F10931 Alcohol use, unspecified with withdrawal delirium: Secondary | ICD-10-CM | POA: Diagnosis not present

## 2024-06-09 MED ORDER — ACETAMINOPHEN 325 MG PO TABS
650.0000 mg | ORAL_TABLET | Freq: Four times a day (QID) | ORAL | Status: DC | PRN
  Administered 2024-06-09: 650 mg via ORAL
  Filled 2024-06-09: qty 2

## 2024-06-09 NOTE — Progress Notes (Signed)
 [facility] PROGRESS NOTE    Progress Note  Juan Clarke  FMW:[id number] DOB: 1981/08/21 DOA: 06/07/2024 PCP: Cleotilde Planas, MD     Brief Narrative:   Juan Clarke is an 43 y.o. male past medical history of heavy alcohol use, with severe withdrawals and DTs in the past requiring intubation, with recurrent multiple admissions for alcohol withdrawal morbid obesity class III, essential hypertension, history of PE on Eliquis  comes in severe alcohol withdrawal.  Assessment/Plan:   Alcohol withdrawal delirium (HCC) Started on thiamine  and folate. D5 half-normal saline with potassium supplement. His CIWA score was high overnight.  Still requiring Ativan . Continue CIWA protocol. Allow with diet.  Elevated LFTs: Acute hepatitis panel is negative. He denies any abdominal pain LFTs are trending down.  Is just this pattern not 2-1 AST ALT is inversed. Check CK.  History of PE: Continue Eliquis .  Essential hypertension: Continue lisinopril  start hydrochlorothiazide .  Erythrocytosis: Resolved with IV fluid resuscitation.  He was likely hemoconcentrated from decreased oral intake.  Generalized anxiety disorder Continue current home regimen.  Hypothyroidism: Continue Synthroid .   DVT prophylaxis: lovenox  Family Communication:none Status is: Inpatient Remains inpatient appropriate because: Severe alcohol withdrawal    Code Status:     Code Status Orders  (From admission, onward)           Start     Ordered   06/07/24 1516  Full code  Continuous       Question:  By:  Answer:  Consent: discussion documented in EHR   06/07/24 1518           Code Status History     Date Active Date Inactive Code Status Order ID Comments User Context   05/28/2024 0623 05/30/2024 2046 Full Code [id number]  Juan Madison, MD ED   05/07/2024 1111 05/09/2024 1848 Full Code [id number]  Juan Alm Lot, MD Inpatient   02/07/2024 2117 02/09/2024 1854 Full Code [id number]   Juan Ales, MD ED   01/31/2024 0953 02/04/2024 1726 Full Code [id number]  Juan Alm Lot, MD ED   01/18/2024 0307 01/30/2024 2142 Full Code [id number]  Juan Harder, MD ED   08/10/2023 1240 08/17/2023 1321 Full Code [id number]  Juan Claretta HERO, MD ED   11/28/2022 0404 11/30/2022 1624 Full Code [id number]  Juan Camila LABOR, MD ED         IV Access:   Peripheral IV   Procedures and diagnostic studies:   No results found.   Medical Consultants:   None.   Subjective:    Juan Clarke has been having some sleeplessness overnight.  Objective:    Vitals:   06/08/24 1643 06/08/24 1721 06/08/24 2051 06/09/24 0631  BP: (!) 141/102 (!) 141/102 (!) 149/105 (!) 146/103  Pulse: (!) 113 (!) 113 100 91  Resp:  16 16 16   Temp:  98.1 F (36.7 C) 98.2 F (36.8 C) 97.7 F (36.5 C)  TempSrc:  Oral Axillary Oral  SpO2:  96% 99% 96%  Weight:      Height:       SpO2: 96 %  No intake or output data in the 24 hours ending 06/09/24 0948  Filed Weights   06/08/24 0127  Weight: 129.2 kg    Exam: General exam: In no acute distress. Respiratory system: Good air movement and clear to auscultation. Cardiovascular system: S1 & S2 heard, RRR. No JVD. Gastrointestinal system: Abdomen is nondistended, soft and nontender.  Extremities: No pedal edema. Skin: No rashes, lesions or ulcers Psychiatry: Judgement and  insight appear normal. Mood & affect appropriate.  Data Reviewed:    Labs: Basic Metabolic Panel: Recent Labs  Lab 06/07/24 1318 06/07/24 1701 06/08/24 0529  NA 139  --  138  K 4.5  --  4.1  CL 100  --  105  CO2 21*  --  20*  GLUCOSE 115*  --  108*  BUN 10  --  14  CREATININE 0.87  --  0.90  CALCIUM  9.7  --  9.2  MG  --  2.2  --   PHOS  --  3.7  --    GFR Estimated Creatinine Clearance: 147 mL/min (by C-G formula based on SCr of 0.9 mg/dL). Liver Function Tests: Recent Labs  Lab 06/07/24 1318 06/08/24 0529  AST 102* 58*  ALT 156* 105*   ALKPHOS 103 90  BILITOT 0.4 0.5  PROT 7.8 6.5  ALBUMIN 5.0 4.3   No results for input(s): LIPASE, AMYLASE in the last 168 hours. No results for input(s): AMMONIA in the last 168 hours. Coagulation profile Recent Labs  Lab 06/07/24 1701  INR 1.1   COVID-19 Labs  No results for input(s): DDIMER, FERRITIN, LDH, CRP in the last 72 hours.  Lab Results  Component Value Date   SARSCOV2NAA NEGATIVE 02/07/2024   SARSCOV2NAA NEGATIVE 01/31/2024    CBC: Recent Labs  Lab 06/07/24 1318 06/08/24 0529  WBC 7.1 9.1  HGB 17.0 15.8  HCT 52.6* 50.3  MCV 86.4 89.5  PLT 267 210   Cardiac Enzymes: Recent Labs  Lab 06/08/24 1029  CKTOTAL 134   BNP (last 3 results) No results for input(s): PROBNP in the last 8760 hours. CBG: No results for input(s): GLUCAP in the last 168 hours. D-Dimer: No results for input(s): DDIMER in the last 72 hours. Hgb A1c: No results for input(s): HGBA1C in the last 72 hours. Lipid Profile: No results for input(s): CHOL, HDL, LDLCALC, TRIG, CHOLHDL, LDLDIRECT in the last 72 hours. Thyroid function studies: No results for input(s): TSH, T4TOTAL, T3FREE, THYROIDAB in the last 72 hours.  Invalid input(s): FREET3 Anemia work up: No results for input(s): VITAMINB12, FOLATE, FERRITIN, TIBC, IRON, RETICCTPCT in the last 72 hours. Sepsis Labs: Recent Labs  Lab 06/07/24 1318 06/08/24 0529  WBC 7.1 9.1   Microbiology No results found for this or any previous visit (from the past 240 hours).   Medications:    allopurinol   300 mg Oral Daily   apixaban   5 mg Oral BID   DULoxetine   120 mg Oral Daily   folic acid   1 mg Oral Daily   hydrochlorothiazide   12.5 mg Oral Daily   levothyroxine   75 mcg Oral QAC breakfast   lisinopril   40 mg Oral Daily   LORazepam   0-4 mg Intravenous Q6H   Followed by   LORazepam   0-4 mg Intravenous Q12H   multivitamin with minerals  1 tablet Oral Daily   thiamine    100 mg Oral Daily   Or   thiamine   100 mg Intravenous Daily   Continuous Infusions:      LOS: 2 days   Juan Clarke  [facility]  06/09/2024, 9:48 AM

## 2024-06-10 DIAGNOSIS — F10931 Alcohol use, unspecified with withdrawal delirium: Secondary | ICD-10-CM | POA: Diagnosis not present

## 2024-06-10 MED ORDER — LISINOPRIL 40 MG PO TABS: 40.0000 mg | ORAL_TABLET | Freq: Every day | ORAL | 0 refills | Status: AC

## 2024-06-10 MED ORDER — CHLORDIAZEPOXIDE HCL 25 MG PO CAPS: ORAL_CAPSULE | ORAL | 0 refills | Status: AC

## 2024-06-10 MED ORDER — HYDROCHLOROTHIAZIDE 12.5 MG PO TABS: 12.5000 mg | ORAL_TABLET | Freq: Every day | ORAL | 1 refills | Status: AC

## 2024-06-10 NOTE — Discharge Summary (Addendum)
 Physician Discharge Summary  Juan Clarke FMW:[id number] DOB: 01-03-1981 DOA: 06/07/2024  PCP: Cleotilde Planas, MD  Admit date: 06/07/2024 Discharge date: 06/10/2024  Admitted From: Home Disposition:  Home  Recommendations for Outpatient Follow-up:  Follow up with PCP in 1-2 weeks Please obtain BMP/CBC in one week   Home Health:No Equipment/Devices:None  Discharge Condition:Stable CODE STATUS:Full Diet recommendation: Heart Healthy   Brief/Interim Summary: 43 y.o. male past medical history of heavy alcohol use, with severe withdrawals and DTs in the past requiring intubation, with recurrent multiple admissions for alcohol withdrawal morbid obesity class III, essential hypertension, history of PE on Eliquis  comes in severe alcohol withdrawal.   Discharge Diagnoses:  Principal Problem:   Alcohol withdrawal delirium (HCC) Active Problems:   Generalized anxiety disorder   Alcohol withdrawal syndrome, with delirium (HCC)   Elevated LFTs  Alcohol withdrawal with delirium: Started on thiamine  and folate half-normal saline with potassium supplement. Started on the CIWA protocol and his withdrawal symptoms resolved. LFTs came down. He was discharged in stable condition. He will continue his taper as an outpatient with Librium . He related he will follow-up with [facility] as an outpatient.  Elevated LFTs: Acute hepatitis panel negative. Check CK unremarkable likely due to alcohol abuse.  History of PE: Continue Eliquis .  Essential hypertension: Continue lisinopril  hydrochlorothiazide .  Erythrocytosis: Likely hemoconcentrated from decreased oral intake he was started on IV fluids his hemoglobin returned to his baseline.  General anxiety disorder: No changes made to his medication.  Hypothyroidism:  Continue Synthroid   Discharge Instructions  Discharge Instructions     Diet - low sodium heart healthy   Complete by: As directed    Increase activity slowly    Complete by: As directed       Allergies as of 06/10/2024       Reactions   Lexapro [escitalopram Oxalate] Other (See Comments)   Lack of therapeutic effect    Zoloft [sertraline Hcl] Other (See Comments)   Lack of therapeutic effect         Medication List     STOP taking these medications    traZODone  100 MG tablet Commonly known as: DESYREL        TAKE these medications    allopurinol  300 MG tablet Commonly known as: ZYLOPRIM  Take 300 mg by mouth daily.   apixaban  5 MG Tabs tablet Commonly known as: Eliquis  Take 1 tablet (5 mg total) by mouth 2 (two) times daily.   atorvastatin  20 MG tablet Commonly known as: LIPITOR Take 20 mg by mouth daily.   DULoxetine  60 MG capsule Commonly known as: CYMBALTA  Take 120 mg by mouth daily.   famotidine -calcium  carbonate-magnesium  hydroxide 10-800-165 MG chewable tablet Commonly known as: PEPCID  COMPLETE Chew 1 tablet by mouth daily as needed (for acid reflux).   hydrochlorothiazide  12.5 MG tablet Commonly known as: HYDRODIURIL  Take 1 tablet (12.5 mg total) by mouth daily.   levothyroxine  75 MCG tablet Commonly known as: SYNTHROID  Take 75 mcg by mouth daily before breakfast.   lisinopril  40 MG tablet Commonly known as: ZESTRIL  Take 1 tablet (40 mg total) by mouth daily.   Melatonin 5 MG Tbdp Take 5 mg by mouth at bedtime as needed (sleep).   methocarbamol  500 MG tablet Commonly known as: ROBAXIN  Take 500 mg by mouth daily as needed for muscle spasms.   propranolol  20 MG tablet Commonly known as: INDERAL  Take 20 mg by mouth 3 (three) times daily as needed (Anxiety).        Allergies  Allergen Reactions   Lexapro [Escitalopram Oxalate] Other (See Comments)    Lack of therapeutic effect    Zoloft [Sertraline Hcl] Other (See Comments)    Lack of therapeutic effect     Consultations: None   Procedures/Studies: No results found. (Echo, Carotid, EGD, Colonoscopy, ERCP)    Subjective: No  complaints  Discharge Exam: Vitals:   06/10/24 0319 06/10/24 0744  BP: (!) 138/93 113/79  Pulse: 98 93  Resp: 18   Temp: 98.4 F (36.9 C) 98.3 F (36.8 C)  SpO2: 96% 97%   Vitals:   06/09/24 1931 06/09/24 2318 06/10/24 0319 06/10/24 0744  BP: 135/83 (!) 144/91 (!) 138/93 113/79  Pulse: 97 87 98 93  Resp: 18 15 18    Temp: 98.5 F (36.9 C) 98.2 F (36.8 C) 98.4 F (36.9 C) 98.3 F (36.8 C)  TempSrc:  Oral    SpO2: 97% 98% 96% 97%  Weight:      Height:        General: Pt is alert, awake, not in acute distress Cardiovascular: RRR, S1/S2 +, no rubs, no gallops Respiratory: CTA bilaterally, no wheezing, no rhonchi Abdominal: Soft, NT, ND, bowel sounds + Extremities: no edema, no cyanosis    The results of significant diagnostics from this hospitalization (including imaging, microbiology, ancillary and laboratory) are listed below for reference.     Microbiology: No results found for this or any previous visit (from the past 240 hours).   Labs: BNP (last 3 results) Recent Labs    02/07/24 1933  BNP 8.9   Basic Metabolic Panel: Recent Labs  Lab 06/07/24 1318 06/07/24 1701 06/08/24 0529  NA 139  --  138  K 4.5  --  4.1  CL 100  --  105  CO2 21*  --  20*  GLUCOSE 115*  --  108*  BUN 10  --  14  CREATININE 0.87  --  0.90  CALCIUM  9.7  --  9.2  MG  --  2.2  --   PHOS  --  3.7  --    Liver Function Tests: Recent Labs  Lab 06/07/24 1318 06/08/24 0529  AST 102* 58*  ALT 156* 105*  ALKPHOS 103 90  BILITOT 0.4 0.5  PROT 7.8 6.5  ALBUMIN 5.0 4.3   No results for input(s): LIPASE, AMYLASE in the last 168 hours. No results for input(s): AMMONIA in the last 168 hours. CBC: Recent Labs  Lab 06/07/24 1318 06/08/24 0529  WBC 7.1 9.1  HGB 17.0 15.8  HCT 52.6* 50.3  MCV 86.4 89.5  PLT 267 210   Cardiac Enzymes: Recent Labs  Lab 06/08/24 1029  CKTOTAL 134   BNP: Invalid input(s): POCBNP CBG: No results for input(s): GLUCAP in the  last 168 hours. D-Dimer No results for input(s): DDIMER in the last 72 hours. Hgb A1c No results for input(s): HGBA1C in the last 72 hours. Lipid Profile No results for input(s): CHOL, HDL, LDLCALC, TRIG, CHOLHDL, LDLDIRECT in the last 72 hours. Thyroid function studies No results for input(s): TSH, T4TOTAL, T3FREE, THYROIDAB in the last 72 hours.  Invalid input(s): FREET3 Anemia work up No results for input(s): VITAMINB12, FOLATE, FERRITIN, TIBC, IRON, RETICCTPCT in the last 72 hours. Urinalysis    Component Value Date/Time   COLORURINE STRAW (A) 05/27/2024 2120   APPEARANCEUR CLEAR 05/27/2024 2120   LABSPEC 1.006 05/27/2024 2120   PHURINE 7.0 05/27/2024 2120   GLUCOSEU NEGATIVE 05/27/2024 2120   HGBUR NEGATIVE 05/27/2024 2120   BILIRUBINUR  NEGATIVE 05/27/2024 2120   KETONESUR NEGATIVE 05/27/2024 2120   PROTEINUR NEGATIVE 05/27/2024 2120   NITRITE NEGATIVE 05/27/2024 2120   LEUKOCYTESUR NEGATIVE 05/27/2024 2120   Sepsis Labs Recent Labs  Lab 06/07/24 1318 06/08/24 0529  WBC 7.1 9.1   Microbiology No results found for this or any previous visit (from the past 240 hours).   Time coordinating discharge: Over 35 minutes  SIGNED:   Erle Odell Castor, MD  [facility] 06/10/2024, 9:23 AM Pager   If 7PM-7AM, please contact night-coverage [email or web address] Password [email or web address]

## 2024-06-10 NOTE — TOC Transition Note (Signed)
 Transition of Care [facility]) - Discharge Note   Patient Details  Name: Juan Clarke MRN: [id number] Date of Birth: Aug 26, 1981  [facility] CM/SW Contact:  Tawni CHRISTELLA Eva, LCSW Phone Number: 06/10/2024, 10:17 AM   Clinical Narrative:      Substance resources attached to pt's AVS.        Patient Goals and CMS Choice            Discharge Placement                       Discharge Plan and Services Additional resources added to the After Visit Summary for                                       Social Drivers of Health (SDOH) Interventions SDOH Screenings   Food Insecurity: No Food Insecurity (05/30/2024)  Recent Concern: Food Insecurity - Food Insecurity Present (05/07/2024)  Housing: High Risk (05/30/2024)  Transportation Needs: No Transportation Needs (05/30/2024)  Utilities: Not At Risk (05/29/2024)  Social Connections: Socially Isolated (05/07/2024)  Tobacco Use: Low Risk  (06/07/2024)     Readmission Risk Interventions    05/09/2024   12:49 PM 02/01/2024    1:16 PM 01/20/2024   12:44 PM  Readmission Risk Prevention Plan  Transportation Screening Complete Complete Complete  PCP or Specialist Appt within 3-5 Days   Complete  HRI or Home Care Consult   Complete  Social Work Consult for Recovery Care Planning/Counseling   Complete  Medication Review [employer]) Complete Complete Complete  PCP or Specialist appointment within 3-5 days of discharge Complete Complete   HRI or Home Care Consult Complete Complete   SW Recovery Care/Counseling Consult Complete Complete   Palliative Care Screening Not Applicable Not Applicable   Skilled Nursing Facility Not Applicable Complete

## 2024-06-25 ENCOUNTER — Encounter: Payer: Self-pay | Admitting: Internal Medicine

## 2024-06-25 ENCOUNTER — Ambulatory Visit: Admitting: Internal Medicine

## 2024-06-25 DIAGNOSIS — I2699 Other pulmonary embolism without acute cor pulmonale: Secondary | ICD-10-CM

## 2024-06-25 NOTE — Progress Notes (Deleted)
 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 [employer]   referred to pulmonary clinic 03/09/2024 by PCCM service  / seen by PCCM service as inpt:    Admit date:     02/07/2024  Discharge date: 02/09/2024   Recommendations at discharge:  Follow-up with PCP within 1 to 2 weeks repeat CBC, CMP, mag, Phos within 1 week Follow-up with pulmonary in outpatient setting within 4 to 6 weeks for pulmonary evaluation follow-up   Discharge Diagnoses: Principal Problem:   HCAP (healthcare-associated pneumonia)   Pulmonary embolism (HCC)   Alcoholism (HCC)   Elevated liver enzymes   Essential hypertension   Class 3 obesity   Acute bilateral deep vein thrombosis (DVT) of popliteal veins (HCC)   Severe sepsis (HCC)   Hyponatremia   CAP (community acquired pneumonia)   Hospital Course: Patient is a 43 year old Caucasian male with past medical history significant for recent PE and DVTs as well as alcohol use disorder who presented with complaints of low blood pressure and feeling weak.  He was recently discharged from the hospital on 02/04/2024 after severe alcohol withdrawals which required intubation and complication of serotonin syndrome.  He has been hospitalized multiple times last week and was admitted on 6 3 2  through 25 for chest pain shortness breath and found of acute PE with right mild right strain and was discharged home on 6 7.  He went to hotel and been taking medications and was going to go to a substance abuse rehab facility but there was found to have low blood pressure and felt dizzy so he was sent to the hospital for further evaluation.  Patient continues to have some chest discomfort which is worse with deep inspiration and has been coughing.  States that his last drink was prior to admission.  Admitted for further evaluation found to have a questionable pneumonia and hypotension which is being treated.   Assessment  and Plan:  SIRS from? HCAP (healthcare-associated pneumonia): Sepsis appears to be ruled out and he did not receive a 30 cc/kg.  Initially was hypotensive and has a questionable pneumonia but has a known PE.  Patient presenting with  productive cough, fever  and infiltrate in lower lobe on chest x-ray-Infiltrate on CXR and 2-3 characteristics (fever, leukocytosis, purulent sputum) are consistent with pneumonia however he has been recently diagnosed with a PE in a similar location.  Currently has been admitted and treated for HCAP and has now been added on cefepime  and doxycycline .  Sputum cultures are pending and blood cultures are pending.  Streptococcal pneumo UA antigen is negative and urine Legionella antigen is negative.  Did not desaturate on amatory home O2 screen continue with empiric antibiotics for now and de-escalate antibiotics; repeat chest x-ray yesterday showed Stable right basilar opacity concerning for pneumonia.  Will continue antibiotics for now and have outpatient follow-up as he is afebrile, has no leukocytosis   Acute bilateral deep vein thrombosis (DVT) of popliteal veins (HCC): Continue Apixaban  10 mg po BID   Alcoholism (HCC) with concern for withdrawal: Order CIWA protocol , monitor for withdrawal and subsequently will add the every as needed order set. Transitional care coordinator consult.  He is doing stable from a CIWA protocol and his CIWA scores were low   Abnormal LFTs/Elevated liver enzymes: In the setting of EToH abuse and slowly improving. AST went from 49 -> 43 and ALT  went from 138 -> 117. CTM and Trend and repeat CMP in the AM Spoke to pt regarding continuing to avoid [facility]   Acquired hypothyroidism: Check TSH in the AM and C/w levothyroxine    Essential Hypertension: Hold Propranolol  given Hypotension; hypotension is improved and will resume propranolol  discharge   Pulmonary Embolism [facility]): C/w Apixaban  10 mg po BID x 7 days then 5 mg po BID.  Has follow-up with  pulmonary outpatient setting   Hyponatremia: In the setting of EToH abuse. Na+ is now 134. Obtain urine electrolytes Given hypotension will rehydrate. CTM and Trend and repeat CMP in the AM   Normocytic Anemia: Hgb/Hct went from 10.9/33.5 -> 11.4/34.5 and is now 10.8/34.1. Checked Anemia Panel showed an iron level of 40, UIBC of 306, TIBC of 346, saturation ration of 12%, ferritin level of 292, folate level 11.0, and vitamin B12 level 259 CTM for S/Sx of Bleeding; No overt bleeding noted. Repeat CBC in the AM    Thrombocytosis: Improving. Platelet Count went from 604 -> 556 is now trended down to 461. CTM and Trend and repeat CBC in the AM   Gout: Continue with Allopurinol  300 po Daily    Hypoalbuminemia: Patient's Albumin Level has now gone from 3.2 -> 3.0 and is now 2.9. CTM and Trend and repeat CMP in the AM  Class II Obesity: Complicates overall prognosis and care. Estimated body mass index is 36.57 kg/m as calculated from the following:   Height as of this encounter: 5' 10 (1.778 m).   Weight as of this encounter: 115.6 kg. Weight Loss and Dietary Counseling given      History of Present Illness  03/09/2024  Pulmonary/ 1st office eval/Tranice Laduke maint on Eliquis  Chief Complaint  Patient presents with   Follow-up    Hospital f/u  Dyspnea:  Not limited by breathing from desired activities-  not doing anything aerobic  Cough: minimal  Sleep: no resp cc / some snoring reported but no excess drowsiness  SABA use: none  02 ldz:[id number]  Rec Continue Eliquis  5 mg twice daily for now  My office will be contacting you by phone for referral to Hematology   need a repeat venous doppler (ultrasound) of both legs before you stop the eliquis   Please schedule a follow up visit in 3 months but call sooner if needed     06/25/2024  f/u ov/Nyal Schachter re: ***   maint on ***  No chief complaint on file.   Dyspnea:  *** Cough: *** Sleeping: *** resp cc  SABA use: *** 02: ***  Lung cancer screening :   ***    No obvious day to day or daytime variability or assoc excess/ purulent sputum or mucus plugs or hemoptysis or cp or chest tightness, subjective wheeze or overt sinus or hb symptoms.    Also denies any obvious fluctuation of symptoms with weather or environmental changes or other aggravating or alleviating factors except as outlined above   No unusual exposure hx or h/o childhood pna/ asthma or knowledge of premature birth.  Current Allergies, Complete Past Medical History, Past Surgical History, Family History, and Social History were reviewed in [employer] record.  ROS  The following are not active complaints unless bolded Hoarseness, sore throat, dysphagia, dental problems, itching, sneezing,  nasal congestion or discharge of excess mucus or purulent secretions, ear ache,   fever, chills, sweats, unintended wt loss or wt gain, classically pleuritic or exertional cp,  orthopnea pnd or arm/hand swelling  or  leg swelling, presyncope, palpitations, abdominal pain, anorexia, nausea, vomiting, diarrhea  or change in bowel habits or change in bladder habits, change in stools or change in urine, dysuria, hematuria,  rash, arthralgias, visual complaints, headache, numbness, weakness or ataxia or problems with walking or coordination,  change in mood or  memory.        No outpatient medications have been marked as taking for the 06/25/24 encounter (Appointment) with Darlean Ozell NOVAK, MD.           Past Medical History:  Diagnosis Date   GAD (generalized anxiety disorder)    HTN (hypertension)    Insomnia    Migraine without aura    Overweight    Trigeminal neuralgia       Objective:     Wt Readings from Last 3 Encounters:  06/08/24 284 lb 13.4 oz (129.2 kg)  05/27/24 279 lb 15.8 oz (127 kg)  05/07/24 281 lb 12 oz (127.8 kg)      Vital signs reviewed  06/25/2024  - Note at rest 02 sats  ***% on ***   General appearance:    ***            Assessment

## 2024-06-30 ENCOUNTER — Other Ambulatory Visit: Payer: Self-pay | Admitting: Internal Medicine
